# Patient Record
Sex: Female | Born: 1940 | Race: Black or African American | Hispanic: No | Marital: Single
Health system: Midwestern US, Community
[De-identification: ages and names within clinical notes are randomized; demographics above are authoritative.]

## PROBLEM LIST (undated history)

## (undated) DIAGNOSIS — D649 Anemia, unspecified: Secondary | ICD-10-CM

## (undated) DIAGNOSIS — K5901 Slow transit constipation: Secondary | ICD-10-CM

## (undated) DIAGNOSIS — Z1211 Encounter for screening for malignant neoplasm of colon: Secondary | ICD-10-CM

## (undated) DIAGNOSIS — R109 Unspecified abdominal pain: Secondary | ICD-10-CM

## (undated) DIAGNOSIS — R700 Elevated erythrocyte sedimentation rate: Secondary | ICD-10-CM

## (undated) DIAGNOSIS — R1319 Other dysphagia: Secondary | ICD-10-CM

## (undated) DIAGNOSIS — G8929 Other chronic pain: Secondary | ICD-10-CM

## (undated) DIAGNOSIS — I10 Essential (primary) hypertension: Secondary | ICD-10-CM

---

## 2008-03-20 LAB — METABOLIC PANEL, COMPREHENSIVE
A-G Ratio: 1 — ABNORMAL LOW (ref 1.2–3.5)
ALT (SGPT): 31 U/L (ref 30–65)
AST (SGOT): 36 U/L (ref 15–37)
Albumin: 4 g/dL (ref 3.2–4.6)
Alk. phosphatase: 69 U/L (ref 50–136)
Anion gap: 7 mmol/L (ref 7–16)
BUN: 15 MG/DL (ref 8–23)
Bilirubin, total: 0.5 MG/DL (ref 0.2–1.1)
CO2: 28 MMOL/L (ref 21–32)
Calcium: 8.8 MG/DL (ref 8.4–10.4)
Chloride: 105 MMOL/L (ref 98–107)
Creatinine: 0.8 MG/DL (ref 0.6–1.0)
GFR est AA: >60 mL/min/{1.73_m2} (ref >60)
GFR est non-AA: >60 mL/min/{1.73_m2} (ref >60)
Globulin: 4.1 g/dL — ABNORMAL HIGH (ref 2.3–3.5)
Glucose: 108 MG/DL — ABNORMAL HIGH (ref 74–106)
Potassium: 4.6 MMOL/L (ref 3.5–5.1)
Protein, total: 8.1 g/dL (ref 6.3–8.2)
Sodium: 140 MMOL/L (ref 136–145)

## 2008-03-20 LAB — CBC WITH AUTOMATED DIFF
ABS. BASOPHILS: 0 10*3/uL (ref 0.0–0.2)
ABS. EOSINOPHILS: 0.1 10*3/uL (ref 0.00–0.80)
ABS. IMM. GRANS.: 0 10*3/uL (ref 0.0–2.0)
ABS. LYMPHOCYTES: 1.7 10*3/uL (ref 0.6–4.3)
ABS. MONOCYTES: 0.3 10*3/uL (ref 0.1–0.9)
ABS. NEUTROPHILS: 1.8 10*3/uL — ABNORMAL LOW (ref 1.9–7.8)
BASOPHILS: 0 % — ABNORMAL LOW (ref 0.1–1.6)
EOSINOPHILS: 2 % (ref 0.5–7.8)
HCT: 41.6 % (ref 35.6–45.0)
HGB: 14.1 g/dL (ref 11.7–15.0)
IMMATURE GRANULOCYTES: 0.3 % (ref 0.0–2.0)
LYMPHOCYTES: 43 % — ABNORMAL HIGH (ref 14.7–41.3)
MCH: 28.3 PG (ref 26.1–32.9)
MCHC: 33.9 g/dL (ref 31.4–35.0)
MCV: 83.5 FL (ref 79.6–97.8)
MONOCYTES: 8 % (ref 3.2–9.0)
MPV: 10.9 FL (ref 9.3–12.9)
NEUTROPHILS: 47 % (ref 47.0–74.6)
PLATELET: 174 10*3/uL (ref 140–440)
RBC: 4.98 M/uL (ref 3.86–5.18)
RDW: 13.7 % (ref 11.9–14.6)
WBC: 3.9 10*3/uL — ABNORMAL LOW (ref 4.5–10.5)

## 2008-03-20 LAB — URINE MICROSCOPIC
Casts: 0 /LPF
Crystals, urine: 0 /LPF
Mucus: 0 /LPF
RBC: 0 /HPF

## 2008-03-20 MED ORDER — SALINE PERIPHERAL FLUSH PRN
INTRAMUSCULAR | Status: DC | PRN
Start: 2008-03-20 — End: 2008-03-20

## 2008-03-20 MED ORDER — SODIUM CHLORIDE 0.9 % IV
INTRAVENOUS | Status: DC
Start: 2008-03-20 — End: 2008-03-20
  Administered 2008-03-20: 15:00:00 via INTRAVENOUS

## 2008-03-20 MED ORDER — DICYCLOMINE 10 MG CAP
10 mg | ORAL_CAPSULE | Freq: Four times a day (QID) | ORAL | Status: AC
Start: 2008-03-20 — End: 2008-03-25

## 2008-03-20 MED ORDER — ONDANSETRON (PF) 4 MG/2 ML INJECTION
4 mg/2 mL | INTRAMUSCULAR | Status: AC
Start: 2008-03-20 — End: 2008-03-20
  Administered 2008-03-20: 15:00:00 via INTRAVENOUS

## 2008-03-20 MED ORDER — DIATRIZOATE MEGLUMINE & SODIUM 66 %-10 % ORAL SOLN
66-10 % | Freq: Once | ORAL | Status: AC
Start: 2008-03-20 — End: 2008-03-20
  Administered 2008-03-20: 15:00:00 via ORAL

## 2008-03-20 MED ORDER — IOVERSOL 320 MG/ML IV SOLN
320 mg iodine/mL | Freq: Once | INTRAVENOUS | Status: AC
Start: 2008-03-20 — End: 2008-03-20
  Administered 2008-03-20: 17:00:00 via INTRAVENOUS

## 2008-03-20 MED ORDER — SALINE PERIPHERAL FLUSH Q8H
Freq: Three times a day (TID) | INTRAMUSCULAR | Status: DC
Start: 2008-03-20 — End: 2008-03-20

## 2008-03-20 MED ORDER — HYDROMORPHONE 2 MG/ML INJECTION SOLUTION
2 mg/mL | INTRAMUSCULAR | Status: AC
Start: 2008-03-20 — End: 2008-03-20
  Administered 2008-03-20: 15:00:00 via INTRAVENOUS

## 2008-03-20 MED ORDER — SODIUM CHLORIDE 0.9 % IV
INTRAVENOUS | Status: DC
Start: 2008-03-20 — End: 2008-03-20
  Administered 2008-03-20: 14:00:00 via INTRAVENOUS

## 2008-03-20 MED FILL — ONDANSETRON (PF) 4 MG/2 ML INJECTION: 4 mg/2 mL | INTRAMUSCULAR | Qty: 2

## 2008-03-20 MED FILL — HYDROMORPHONE 2 MG/ML INJECTION SOLUTION: 2 mg/mL | INTRAMUSCULAR | Qty: 1

## 2008-03-20 NOTE — Progress Notes (Signed)
Oral contrast for CT delivered to ER

## 2008-03-20 NOTE — ED Notes (Signed)
Bedside report given to [facility]. Patient sitting up in bed drinking contrast for CT. No apparent distress.

## 2008-03-20 NOTE — ED Notes (Signed)
I have reviewed discharge instructions with the patient.  The patient verbalized understanding.  To make follow up appt. with referral physician.  Return if gets worse.   Given written info about [employer] [facility]

## 2008-03-20 NOTE — ED Notes (Signed)
Finished contrast.  CT notified

## 2008-03-20 NOTE — ED Notes (Signed)
Attempts to get U/A without success. Aware NPO>

## 2008-03-20 NOTE — ED Notes (Signed)
Pt was feeling better after pain med. Waiting for her to go to CT.

## 2008-03-20 NOTE — ED Provider Notes (Signed)
HPI Comments: Pt stated she has had some mild abd pain in right lower side intermittently for the last few weeks. Over the last couple days this pain has worsened and she has developed some over lying colicky type pain in riq. She denies any n/v/d/c/ She has taken some otc meds.     Abdominal Pain   The history is provided by the patient. The current episode started more than 1 week ago. The problem has been gradually worsening. The pain is severe. Associated symptoms include frequency and back pain. Pertinent negatives include no anorexia, no fever, no diarrhea, no hematochezia, no melena, no nausea, no vomiting, no constipation, no dysuria, no hematuria, no trauma and no chest pain. The pain is worsened by certain positions. The pain is relieved by nothing.        Past Medical History   Diagnosis Date   ??? Hypertension    ??? Endocrine Disease      thyroid   ??? Diabetes    ??? Other Ill-Defined Conditions      cholesterol          Past Surgical History   Procedure Date   ??? Hx heent      goiter/thyroid surgery           No family history on file.     History   Social History   ??? Marital Status: Single     Spouse Name: N/A     Number of Children: N/A   ??? Years of Education: N/A   Occupational History   ??? Not on file.   Social History Main Topics   ??? Tobacco Use: Never   ??? Alcohol Use: No   ??? Drug Use: No   ??? Sexually Active:    Other Topics Concern   ??? Not on file   Social History Narrative   ??? No narrative on file           ALLERGIES: Other      Review of Systems   Constitutional: Negative for fever and chills.   HENT: Negative for sore throat.    Respiratory: Negative for cough and shortness of breath.    Cardiovascular: Negative for chest pain.   Gastrointestinal: Positive for abdominal pain. Negative for nausea, vomiting, diarrhea, constipation, blood in stool, melena and hematochezia.   Genitourinary: Positive for frequency. Negative for dysuria and hematuria.   Musculoskeletal: Positive for back pain.    All other systems reviewed and are negative.      Filed Vitals:    03/20/2008  9:30 AM   BP: 151/83   Pulse: 70   Temp: 98.4 ??F (36.9 ??C)   Resp: 16   Height: 5\' 2"  (1.575 m)   Weight: 210 lb (95.255 kg)   SpO2: 100%              Physical Exam   Nursing note and vitals reviewed.  Constitutional: She is oriented. She appears well-developed and well-nourished. She appears distressed.   HENT:   Head: Normocephalic and atraumatic.   Right Ear: External ear normal.   Left Ear: External ear normal.   Mouth/Throat: Oropharynx is clear and moist.   Eyes: Extraocular motions are normal. Pupils are equal, round, and reactive to light.   Neck: Normal range of motion. Neck supple.   Cardiovascular: Normal rate and regular rhythm.    Pulmonary/Chest: Effort normal and breath sounds normal.   Abdominal: Soft. Tenderness is present. She has no rebound and no guarding.  Some tenderness in rlq bowel sounds dec   Musculoskeletal: Normal range of motion. She exhibits no edema and no tenderness.   Lymphadenopathy:     She has no cervical adenopathy.   Neurological: She is alert and oriented.   Skin: Skin is warm and dry.   Psychiatric: She has a normal mood and affect.            Coding

## 2008-03-20 NOTE — ED Notes (Signed)
Ct report does not show any surgical abnormality. Discussed this with pt. Will refer to fmd on call.

## 2009-06-13 NOTE — ED Provider Notes (Signed)
HPI Comments: 68 bf complains of chest pain that started yesterday. The pain is in her right chest and it radiates to the left side and into the upper left abdomen. No nausea or vomiting and no sob. She is not a smoker and there is family history of CVA but no heart disease.     Patient is a 68 y.o. female presenting with chest pain. The history is provided by the patient and a relative.   Chest Pain   This is a new problem. The current episode started 12 to 24 hours ago. The problem has been gradually improving. The problem occurs constantly. The pain is associated with normal activity, stress and coughing. The pain is present in the right side and epigastric region. The pain is at a severity of 3/10. The pain is mild. The quality of the pain is described as pressure-like. Radiates to: ritght chest to the left abdomen. The symptoms are aggravated by certain positions and deep breathing. Associated symptoms include exertional chest pressure. Pertinent negatives include no diaphoresis, no fever, no malaise/fatigue, no numbness, no irregular heartbeat, no palpitations, no nausea, no vomiting, no back pain, no dizziness, no weakness, no cough and no shortness of breath. She has tried nothing for the symptoms. Risk factors include diabetes mellitus, obesity and hypertension. Her past medical history is significant for DM and HTN.       Past Medical History   Diagnosis Date   ??? Hypertension    ??? Endocrine disease      thyroid   ??? Diabetes    ??? Other ill-defined conditions      cholesterol   ??? Other ill-defined conditions      heart cath 96          Past Surgical History   Procedure Date   ??? Hx heent      goiter/thyroid surgery           No family history on file.     History   Social History   ??? Marital Status: Single     Spouse Name: N/A     Number of Children: N/A   ??? Years of Education: N/A   Occupational History   ??? Not on file.   Social History Main Topics   ??? Tobacco Use: Never   ??? Alcohol Use: No    ??? Drug Use: No   ??? Sexually Active:    Other Topics Concern   ??? Not on file   Social History Narrative   ??? No narrative on file           ALLERGIES: Other and Ativan      Review of Systems   Constitutional: Negative for fever, malaise/fatigue and diaphoresis.   Respiratory: Negative for cough and shortness of breath.    Cardiovascular: Positive for chest pain. Negative for palpitations.   Gastrointestinal: Negative for nausea and vomiting.   Musculoskeletal: Negative for back pain.   Neurological: Negative for dizziness, weakness and numbness.   All other systems reviewed and are negative.        Filed Vitals:    06/13/2009  9:58 PM   BP: 171/72   Pulse: 68   Temp: 98 ??F (36.7 ??C)   Resp: 14   Height: 5\' 2"  (1.575 m)   Weight: 215 lb (97.523 kg)   SpO2: 97%              Physical Exam   Nursing note and vitals reviewed.  Constitutional: She  is oriented to person, place, and time. She appears well-developed and well-nourished.   HENT:   Head: Normocephalic and atraumatic.   Right Ear: External ear normal.   Left Ear: External ear normal.   Nose: Nose normal.   Mouth/Throat: Oropharynx is clear and moist.   Eyes: Conjunctivae and extraocular motions are normal. Pupils are equal, round, and reactive to light.   Neck: Normal range of motion. Neck supple.   Cardiovascular: Normal rate, regular rhythm, normal heart sounds and intact distal pulses.    Pulmonary/Chest: Effort normal and breath sounds normal. No respiratory distress. She has no wheezes. She has no rales. She exhibits no tenderness.   Abdominal: Soft. Bowel sounds are normal. She exhibits no distension. No tenderness. She has no rebound.   Musculoskeletal: Normal range of motion. She exhibits no edema and no tenderness.   Neurological: She is alert and oriented to person, place, and time.   Skin: Skin is warm and dry.   Psychiatric: She has a normal mood and affect.        Coding    Procedures

## 2009-06-13 NOTE — ED Notes (Signed)
Pt states started having cp yesterday and thought it would get better. States she came in today because her chest, back (lower) and L lower ribcage area hurt.

## 2009-06-14 LAB — CBC WITH AUTOMATED DIFF
ABS. BASOPHILS: 0 10*3/uL (ref 0.0–0.2)
ABS. EOSINOPHILS: 0.1 10*3/uL (ref 0.0–0.8)
ABS. IMM. GRANS.: 0 10*3/uL (ref 0.0–2.0)
ABS. LYMPHOCYTES: 2.2 10*3/uL (ref 0.5–4.6)
ABS. MONOCYTES: 0.3 10*3/uL (ref 0.1–1.3)
ABS. NEUTROPHILS: 1.9 10*3/uL (ref 1.7–8.2)
BASOPHILS: 0 % (ref 0.0–2.0)
EOSINOPHILS: 3 % (ref 0.5–7.8)
HCT: 42.5 % (ref 37.6–48.3)
HGB: 13.8 g/dL (ref 11.7–15.0)
IMMATURE GRANULOCYTES: 0.2 % (ref 0.0–2.0)
LYMPHOCYTES: 48 % — ABNORMAL HIGH (ref 13–44)
MCH: 28.1 PG (ref 26.1–32.9)
MCHC: 32.5 g/dL (ref 31.4–35.0)
MCV: 86.6 FL (ref 79.6–97.8)
MONOCYTES: 7 % (ref 4.0–12.0)
MPV: 11.4 FL (ref 10.8–14.1)
NEUTROPHILS: 42 % — ABNORMAL LOW (ref 43–78)
PLATELET: 175 10*3/uL (ref 140–440)
RBC: 4.91 M/uL (ref 3.86–5.18)
RDW: 13.7 % (ref 11.9–14.6)
WBC: 4.6 10*3/uL (ref 4.0–10.5)

## 2009-06-14 LAB — METABOLIC PANEL, COMPREHENSIVE
A-G Ratio: 1 — ABNORMAL LOW (ref 1.2–3.5)
ALT (SGPT): 37 U/L — ABNORMAL LOW (ref 39–65)
AST (SGOT): 22 U/L (ref 15–37)
Albumin: 3.9 g/dL (ref 3.2–4.6)
Alk. phosphatase: 71 U/L (ref 50–136)
Anion gap: 6 mmol/L — ABNORMAL LOW (ref 7–16)
BUN: 13 MG/DL (ref 8–23)
Bilirubin, total: 0.7 MG/DL (ref 0.2–1.1)
CO2: 27 MMOL/L (ref 21–32)
Calcium: 9.3 MG/DL (ref 8.4–10.4)
Chloride: 104 MMOL/L (ref 98–107)
Creatinine: 0.9 MG/DL (ref 0.6–1.0)
GFR est AA: >60 mL/min/{1.73_m2} (ref >60)
GFR est non-AA: >60 mL/min/{1.73_m2} (ref >60)
Globulin: 4.1 g/dL — ABNORMAL HIGH (ref 2.3–3.5)
Glucose: 103 MG/DL (ref 82–115)
Potassium: 3.8 MMOL/L (ref 3.5–5.1)
Protein, total: 8 g/dL (ref 6.3–8.2)
Sodium: 137 MMOL/L (ref 136–145)

## 2009-06-14 MED ORDER — PROPOXYPHENE N-ACETAMINOPHEN 100 MG-650 MG TAB
100-650 mg | ORAL_TABLET | Freq: Four times a day (QID) | ORAL | Status: AC | PRN
Start: 2009-06-14 — End: 2009-06-21

## 2009-06-14 MED ADMIN — propoxyphene napsylate-acetaminophen (DARVOCET-N 100) 100-650 mg per tablet 1 Tab: ORAL | @ 04:00:00 | NDC 51079032201

## 2009-06-14 MED ADMIN — aspirin chewable tablet 324 mg: ORAL | @ 02:00:00 | NDC 63739043401

## 2009-06-14 MED FILL — PROPOXYPHENE N-ACETAMINOPHEN 100 MG-650 MG TAB: 100-650 mg | ORAL | Qty: 1

## 2009-06-14 MED FILL — ASPIRIN 81 MG CHEWABLE TAB: 81 mg | ORAL | Qty: 4

## 2009-06-14 NOTE — ED Notes (Signed)
I have reviewed discharge instructions with the patient.  The patient verbalized understanding.

## 2011-10-06 LAB — METABOLIC PANEL, COMPREHENSIVE
A-G Ratio: 0.9 — ABNORMAL LOW (ref 1.2–3.5)
ALT (SGPT): 29 U/L (ref 12–65)
AST (SGOT): 23 U/L (ref 15–37)
Albumin: 4 g/dL (ref 3.2–4.6)
Alk. phosphatase: 50 U/L (ref 50–136)
Anion gap: 9 mmol/L (ref 7–16)
BUN: 14 MG/DL (ref 8–23)
Bilirubin, total: 0.7 MG/DL (ref 0.2–1.1)
CO2: 28 MMOL/L (ref 21–32)
Calcium: 9.2 MG/DL (ref 8.3–10.4)
Chloride: 104 MMOL/L (ref 98–107)
Creatinine: 0.84 MG/DL (ref 0.6–1.0)
GFR est AA: >60 mL/min/{1.73_m2} (ref >60)
GFR est non-AA: >60 mL/min/{1.73_m2} (ref >60)
Globulin: 4.3 g/dL — ABNORMAL HIGH (ref 2.3–3.5)
Glucose: 82 MG/DL (ref 65–100)
Potassium: 4 MMOL/L (ref 3.5–5.1)
Protein, total: 8.3 g/dL — ABNORMAL HIGH (ref 6.3–8.2)
Sodium: 141 MMOL/L (ref 136–145)

## 2011-10-06 LAB — CBC WITH AUTOMATED DIFF
ABS. BASOPHILS: 0 10*3/uL (ref 0.0–0.2)
ABS. EOSINOPHILS: 0.1 10*3/uL (ref 0.0–0.8)
ABS. IMM. GRANS.: 0 10*3/uL (ref 0.0–0.5)
ABS. LYMPHOCYTES: 2.2 10*3/uL (ref 0.5–4.6)
ABS. MONOCYTES: 0.2 10*3/uL (ref 0.1–1.3)
ABS. NEUTROPHILS: 1.9 10*3/uL (ref 1.7–8.2)
BASOPHILS: 0 % (ref 0.0–2.0)
EOSINOPHILS: 2 % (ref 0.5–7.8)
HCT: 44.2 % (ref 35.8–46.3)
HGB: 15.1 g/dL (ref 11.7–15.4)
IMMATURE GRANULOCYTES: 0.2 % (ref 0.0–5.0)
LYMPHOCYTES: 50 % — ABNORMAL HIGH (ref 13–44)
MCH: 28.6 PG (ref 26.1–32.9)
MCHC: 34.2 g/dL (ref 31.4–35.0)
MCV: 83.7 FL (ref 79.6–97.8)
MONOCYTES: 5 % (ref 4.0–12.0)
MPV: 10.9 FL (ref 10.8–14.1)
NEUTROPHILS: 43 % (ref 43–78)
PLATELET: 159 10*3/uL (ref 150–450)
RBC: 5.28 M/uL — ABNORMAL HIGH (ref 4.05–5.25)
RDW: 13.6 % (ref 11.9–14.6)
WBC: 4.5 10*3/uL (ref 4.3–11.1)

## 2011-10-06 LAB — POC TROPONIN: Troponin-I (POC): 0.01 ng/ml (ref 0.0–0.08)

## 2011-10-06 NOTE — ED Notes (Signed)
I have reviewed discharge instructions with the patient. Prescriptions was not given.   The patient verbalized understanding.  Discharged ambulatory with family in no acute distress.

## 2011-10-06 NOTE — ED Notes (Signed)
.  I have discussed the results of labs, procedures, radiographs, treatments as well as any previous results found within the [facility]. [employer] with the patient and available family. A treatment plan was developed in conjunction with the patient and was agreed upon. The patient is ready for discharge at this time. All voiced understanding of the discharge plan and medication instructions or changes as appropriate. Questions about treatment in the ED were answered. The patient was encouraged to return should symptoms worsen or new problems develop. A follow up physician was provided to the patient on the discharge papers.       HR in the ED has remained within normal range.  Pt understands she needs PMD follow up for possible holter monitor placement.

## 2011-10-06 NOTE — ED Notes (Signed)
Int started and labs drawn and sent

## 2011-10-06 NOTE — ED Provider Notes (Signed)
Patient is a 71 y.o. female presenting with palpitations. The history is provided by the patient and a relative.   Palpitations   This is a recurrent problem. The current episode started 6 to 12 hours ago. The problem has been resolved. Episode frequency: occur intermittently. The problem is associated with nothing. Associated symptoms include chest pain. Pertinent negatives include no diaphoresis, no near-syncope and no syncope. Risk factors include no risk factors.      Pt is a 71 y/o female with hx of palpitations.  She has not had PMD or cardiology eval for same. She reports around 1am having an episode of palpitations lasting to about 6pm and then resolving spontaneously. She has "pain under her left breast" but denies chest pain.   Past Medical History   Diagnosis Date   ??? Diabetes    ??? Hypertension    ??? CAD (coronary artery disease)         No past surgical history on file.      No family history on file.     History     Social History   ??? Marital Status: SINGLE     Spouse Name: N/A     Number of Children: N/A   ??? Years of Education: N/A     Occupational History   ??? Not on file.     Social History Main Topics   ??? Smoking status: Not on file   ??? Smokeless tobacco: Not on file   ??? Alcohol Use:    ??? Drug Use:    ??? Sexually Active:      Other Topics Concern   ??? Not on file     Social History Narrative   ??? No narrative on file                  ALLERGIES: Ativan and Other      Review of Systems   Constitutional: Negative for diaphoresis.   Cardiovascular: Positive for chest pain and palpitations. Negative for syncope and near-syncope.   All other systems reviewed and are negative.        Filed Vitals:    10/06/11 1456   BP: 157/88   Pulse: 80   Resp: 16   Height: 5\' 2"  (1.575 m)   Weight: 97.523 kg (215 lb)   SpO2: 98%            Physical Exam   Nursing note and vitals reviewed.  Constitutional: She is oriented to person, place, and time. She appears well-developed and well-nourished.   HENT:   Head: Normocephalic  and atraumatic.   Mouth/Throat: Oropharynx is clear and moist.   Eyes: EOM are normal. Pupils are equal, round, and reactive to light.   Neck: Normal range of motion. Neck supple.   Cardiovascular: Normal rate, regular rhythm, normal heart sounds and intact distal pulses.    Pulmonary/Chest: Effort normal and breath sounds normal.   Abdominal: Soft. Bowel sounds are normal. There is no tenderness.   Musculoskeletal: Normal range of motion.   Neurological: She is alert and oriented to person, place, and time.   Skin: Skin is warm and dry.   Psychiatric: She has a normal mood and affect.        MDM    Procedures

## 2011-10-07 LAB — EKG, 12 LEAD, INITIAL
Atrial Rate: 80 {beats}/min
Calculated P Axis: 69 degrees
Calculated R Axis: 39 degrees
Calculated T Axis: 34 degrees
P-R Interval: 198 ms
Q-T Interval: 380 ms
QRS Duration: 88 ms
QTC Calculation (Bezet): 438 ms
Ventricular Rate: 80 {beats}/min

## 2014-06-11 LAB — AMB EXT HGBA1C: Hemoglobin A1c, External: 5.8

## 2014-06-11 LAB — AMB EXT LDL-C: LDL-C, External: 121.8

## 2015-07-06 ENCOUNTER — Emergency Department: Admit: 2015-07-06 | Payer: PRIVATE HEALTH INSURANCE | Primary: Family Medicine

## 2015-07-06 ENCOUNTER — Inpatient Hospital Stay
Admit: 2015-07-06 | Discharge: 2015-07-08 | Disposition: A | Discharge status: Home / Self Care | Payer: PRIVATE HEALTH INSURANCE | Attending: Cardiovascular Disease | Admitting: Cardiovascular Disease

## 2015-07-06 DIAGNOSIS — I25110 Atherosclerotic heart disease of native coronary artery with unstable angina pectoris: Principal | ICD-10-CM

## 2015-07-06 LAB — EKG, 12 LEAD, INITIAL
Atrial Rate: 75 {beats}/min
Calculated P Axis: 66 degrees
Calculated R Axis: 41 degrees
Calculated T Axis: 67 degrees
P-R Interval: 212 ms
Q-T Interval: 422 ms
QRS Duration: 90 ms
QTC Calculation (Bezet): 471 ms
Ventricular Rate: 75 {beats}/min

## 2015-07-06 LAB — METABOLIC PANEL, COMPREHENSIVE
A-G Ratio: 1 — ABNORMAL LOW (ref 1.2–3.5)
ALT (SGPT): 33 U/L (ref 12–65)
AST (SGOT): 37 U/L (ref 15–37)
Albumin: 3.8 g/dL (ref 3.2–4.6)
Alk. phosphatase: 58 U/L (ref 50–136)
Anion gap: 6 mmol/L — ABNORMAL LOW (ref 7–16)
BUN: 12 MG/DL (ref 8–23)
Bilirubin, total: 1.2 MG/DL — ABNORMAL HIGH (ref 0.2–1.1)
CO2: 30 mmol/L (ref 21–32)
Calcium: 8.7 MG/DL (ref 8.3–10.4)
Chloride: 103 mmol/L (ref 98–107)
Creatinine: 0.96 MG/DL (ref 0.6–1.0)
GFR est AA: >60 mL/min/{1.73_m2} (ref >60)
GFR est non-AA: >60 mL/min/{1.73_m2} (ref >60)
Globulin: 3.9 g/dL — ABNORMAL HIGH (ref 2.3–3.5)
Glucose: 91 mg/dL (ref 65–100)
Potassium: 4.7 mmol/L (ref 3.5–5.1)
Protein, total: 7.7 g/dL (ref 6.3–8.2)
Sodium: 139 mmol/L (ref 136–145)

## 2015-07-06 LAB — CBC WITH AUTOMATED DIFF
ABS. BASOPHILS: 0 10*3/uL (ref 0.0–0.2)
ABS. EOSINOPHILS: 0.1 10*3/uL (ref 0.0–0.8)
ABS. IMM. GRANS.: 0 10*3/uL (ref 0.0–0.5)
ABS. LYMPHOCYTES: 2.3 10*3/uL (ref 0.5–4.6)
ABS. MONOCYTES: 0.3 10*3/uL (ref 0.1–1.3)
ABS. NEUTROPHILS: 1.4 10*3/uL — ABNORMAL LOW (ref 1.7–8.2)
BASOPHILS: 0 % (ref 0.0–2.0)
EOSINOPHILS: 2 % (ref 0.5–7.8)
HCT: 43.2 % (ref 35.8–46.3)
HGB: 14.5 g/dL (ref 11.7–15.4)
IMMATURE GRANULOCYTES: 0.7 % (ref 0.0–5.0)
LYMPHOCYTES: 55 % — ABNORMAL HIGH (ref 13–44)
MCH: 28.9 PG (ref 26.1–32.9)
MCHC: 33.6 g/dL (ref 31.4–35.0)
MCV: 86.2 FL (ref 79.6–97.8)
MONOCYTES: 8 % (ref 4.0–12.0)
MPV: 12.2 FL (ref 10.8–14.1)
NEUTROPHILS: 34 % — ABNORMAL LOW (ref 43–78)
PLATELET: 153 10*3/uL (ref 150–450)
RBC: 5.01 M/uL (ref 4.05–5.25)
RDW: 13.8 % (ref 11.9–14.6)
WBC: 4.1 10*3/uL — ABNORMAL LOW (ref 4.3–11.1)

## 2015-07-06 LAB — TROPONIN I
Troponin-I, Qt.: 0.1 NG/ML — CR (ref 0.02–0.05)
Troponin-I, Qt.: 0.11 NG/ML — CR (ref 0.02–0.05)
Troponin-I, Qt.: 0.11 NG/ML — CR (ref 0.02–0.05)

## 2015-07-06 LAB — GLUCOSE, POC: Glucose (POC): 93 mg/dL (ref 65–100)

## 2015-07-06 MED ORDER — HEPARIN (PORCINE) IN D5W 25,000 UNIT/500 ML IV
25000 unit/500 mL (50 unit/mL) | INTRAVENOUS | Status: DC
Start: 2015-07-06 — End: 2015-07-06

## 2015-07-06 MED ORDER — ASPIRIN 81 MG CHEWABLE TAB
81 mg | Freq: Every day | ORAL | Status: DC
Start: 2015-07-06 — End: 2015-07-08
  Administered 2015-07-07 – 2015-07-08 (×2): via ORAL

## 2015-07-06 MED ORDER — NITROGLYCERIN 2 % TRANSDERMAL OINTMENT
2 % | Freq: Four times a day (QID) | TRANSDERMAL | Status: DC
Start: 2015-07-06 — End: 2015-07-08
  Administered 2015-07-06 – 2015-07-08 (×8): via TOPICAL

## 2015-07-06 MED ORDER — NITROGLYCERIN 2 % TRANSDERMAL OINTMENT
2 % | TRANSDERMAL | Status: AC
Start: 2015-07-06 — End: 2015-07-06
  Administered 2015-07-06: 16:00:00 via TOPICAL

## 2015-07-06 MED ORDER — NITROGLYCERIN 0.4 MG SUBLINGUAL TAB
0.4 mg | SUBLINGUAL | Status: DC | PRN
Start: 2015-07-06 — End: 2015-07-08

## 2015-07-06 MED ORDER — SODIUM CHLORIDE 0.9 % IJ SYRG
INTRAMUSCULAR | Status: DC | PRN
Start: 2015-07-06 — End: 2015-07-08

## 2015-07-06 MED ORDER — ASPIRIN 81 MG CHEWABLE TAB
81 mg | Freq: Once | ORAL | Status: AC
Start: 2015-07-06 — End: 2015-07-06
  Administered 2015-07-06: 16:00:00 via ORAL

## 2015-07-06 MED ORDER — MORPHINE 2 MG/ML INJECTION
2 mg/mL | INTRAMUSCULAR | Status: DC | PRN
Start: 2015-07-06 — End: 2015-07-08

## 2015-07-06 MED ORDER — METOPROLOL TARTRATE 25 MG TAB
25 mg | Freq: Four times a day (QID) | ORAL | Status: DC
Start: 2015-07-06 — End: 2015-07-07
  Administered 2015-07-06: 22:00:00 via ORAL

## 2015-07-06 MED ORDER — ATORVASTATIN 40 MG TAB
40 mg | Freq: Every evening | ORAL | Status: DC
Start: 2015-07-06 — End: 2015-07-08
  Administered 2015-07-07 – 2015-07-08 (×2): via ORAL

## 2015-07-06 MED ORDER — HEPARIN (PORCINE) IN D5W 25,000 UNIT/500 ML IV
25000 unit/500 mL (50 unit/mL) | INTRAVENOUS | Status: DC
Start: 2015-07-06 — End: 2015-07-08
  Administered 2015-07-06 – 2015-07-08 (×7): via INTRAVENOUS

## 2015-07-06 MED ORDER — SODIUM CHLORIDE 0.9 % IJ SYRG
Freq: Three times a day (TID) | INTRAMUSCULAR | Status: DC
Start: 2015-07-06 — End: 2015-07-08
  Administered 2015-07-06 – 2015-07-08 (×5): via INTRAVENOUS

## 2015-07-06 MED ORDER — HYDROCODONE-ACETAMINOPHEN 5 MG-325 MG TAB
5-325 mg | Freq: Once | ORAL | Status: AC
Start: 2015-07-06 — End: 2015-07-06
  Administered 2015-07-06: 16:00:00 via ORAL

## 2015-07-06 MED ORDER — HEPARIN (PORCINE) 5,000 UNIT/ML IJ SOLN
5000 unit/mL | Freq: Once | INTRAMUSCULAR | Status: AC
Start: 2015-07-06 — End: 2015-07-06
  Administered 2015-07-06: 19:00:00 via INTRAVENOUS

## 2015-07-06 MED ORDER — SODIUM CHLORIDE 0.9 % IJ SYRG
Freq: Three times a day (TID) | INTRAMUSCULAR | Status: DC
Start: 2015-07-06 — End: 2015-07-08
  Administered 2015-07-06 – 2015-07-08 (×7): via INTRAVENOUS

## 2015-07-06 MED ORDER — LISINOPRIL-HYDROCHLOROTHIAZIDE 20 MG-12.5 MG TAB
Freq: Every day | ORAL | Status: DC
Start: 2015-07-06 — End: 2015-07-08
  Administered 2015-07-07 – 2015-07-08 (×2): via ORAL

## 2015-07-06 MED FILL — ASPIRIN 81 MG CHEWABLE TAB: 81 mg | ORAL | Qty: 4

## 2015-07-06 MED FILL — HEPARIN (PORCINE) IN D5W 25,000 UNIT/500 ML IV: 25000 unit/500 mL (50 unit/mL) | INTRAVENOUS | Qty: 500

## 2015-07-06 MED FILL — NITRO-BID 2 % TRANSDERMAL OINTMENT: 2 % | TRANSDERMAL | Qty: 1

## 2015-07-06 MED FILL — HYDROCODONE-ACETAMINOPHEN 5 MG-325 MG TAB: 5-325 mg | ORAL | Qty: 1

## 2015-07-06 MED FILL — HEPARIN (PORCINE) 5,000 UNIT/ML IJ SOLN: 5000 unit/mL | INTRAMUSCULAR | Qty: 1

## 2015-07-06 MED FILL — METOPROLOL TARTRATE 25 MG TAB: 25 mg | ORAL | Qty: 1

## 2015-07-06 NOTE — Progress Notes (Signed)
Bedside shift change report received from Josh. Z, RN. Report included the following information SBAR, Kardex, MAR and Recent Results.

## 2015-07-06 NOTE — ED Notes (Signed)
TRANSFER - OUT REPORT:    Verbal report given to Josh, RN on Sarah Chavez  being transferred to 302 for routine progression of care       Report consisted of patient???s Situation, Background, Assessment and   Recommendations(SBAR).     Information from the following report(s) SBAR, Kardex, ED Summary, MAR, Accordion, Recent Results and Med Rec Status was reviewed with the receiving nurse.    Lines:   Peripheral IV 07/06/15 Left Antecubital (Active)   Site Assessment Clean, dry, & intact 07/06/2015  3:02 PM   Phlebitis Assessment 0 07/06/2015  3:02 PM   Infiltration Assessment 0 07/06/2015  3:02 PM   Dressing Status Clean, dry, & intact 07/06/2015  3:02 PM   Dressing Type Tape;Transparent 07/06/2015  3:02 PM   Hub Color/Line Status Patent 07/06/2015  3:02 PM        Opportunity for questions and clarification was provided.      Patient transported with:   Monitor  Registered Nurse

## 2015-07-06 NOTE — H&P (Addendum)
[facility] H&P    Admitting Cardiologist:Dr. Kristeen Miss    Primary Cardiologist:none    Primary Care Physician:Dr. Debbrah Alar    Subjective:     Sarah Chavez is a 74 y.o. female with history of HTN, DM no prior CAD who presents with several month history of chest discomfort occurring at rest and with exertion, associated SOB, bilateral arm discomfort. She relates that her episode of chest discomfort have been increasing in frequency and intensity. Nonsmoker, several family members with prior CVA. She denies prior CVA. No bleeding or clotting tendencies.     Past Medical History   Diagnosis Date   ??? CAD (coronary artery disease)    ??? Diabetes ([place])    ??? Endocrine disease      thyroid   ??? Hypertension    ??? Other ill-defined conditions(799.89)      cholesterol   ??? Other ill-defined conditions(799.89)      heart cath 96      Past Surgical History   Procedure Laterality Date   ??? Hx heent       goiter/thyroid surgery      Current Facility-Administered Medications   Medication Dose Route Frequency   ??? sodium chloride (NS) flush 5-10 mL  5-10 mL IntraVENous Q8H   ??? sodium chloride (NS) flush 5-10 mL  5-10 mL IntraVENous PRN     Current Outpatient Prescriptions   Medication Sig   ??? lisinopril-hydrochlorothiazide (PRINZIDE, ZESTORETIC) 20-12.5 mg per tablet Take 1 Tab by mouth daily.   ??? glipiZIDE (GLUCOTROL) 10 mg tablet Take 25 mg by mouth daily (before breakfast).     Allergies   Allergen Reactions   ??? Ativan [Lorazepam] Other (comments)     Made her feel crazy and chest pressure   ??? Ativan [Lorazepam] Other (comments)     Chest fullness   ??? Other Medication Palpitations     Some type of decongestant   ??? Other Medication Other (comments)     Doesn't take decongestants, but can't recall why      Social History   Substance Use Topics   ??? Smoking status: Never Smoker   ??? Smokeless tobacco: Not on file   ??? Alcohol use Not on file      History reviewed. No pertinent family history.     Review of Systems   Gen: Denies fever, chills, malaise or fatigue. Appetite good.   HEENT: Denies frequent headaches, dizzyness, visual disturbances, Neck pain or swallowing difficulty  Lungs: Denies  hx of COPD or asthma  Cardiovascular: as above   GI: Denies hememesis, dark tarry stools, No prior Hx of GI bleed, Denies constipation  GU: Denies dysuria, no complaints of frequency, nocturia  Heme: No prior bleeding disorders, no prior Cancer  Neuro: Denies prior CVA, TIA.  Endocrine: + diabetes, no thyroid disorders  Psychiatric: Denies anxiety, or other psychiatric illnesses.   MSK--right hip discomfort, while walking--thru right buttocks.     Objective:     Visit Vitals   ??? BP 187/79   ??? Pulse (!) 57   ??? Temp 98 ??F (36.7 ??C)   ??? Resp 16   ??? Ht 5' 2" (1.575 m)   ??? Wt 99.8 kg (220 lb)   ??? SpO2 99%   ??? BMI 40.24 kg/m2     General:Alert, cooperative, no distress, appears stated age  Head: Normocephalic, without obvious abnormality, atraumatic.   Eyes: Conjunctivae/corneas clear. PERRL, EOMs intact  Nose:Nares normal. Septum midline. Mucosa normal. No drainage or sinus  tenderness.   Throat: Lips, mucosa, and tongue normal. Teeth and gums normal.   Neck: Supple, symmetrical, trachea midline,  no carotid bruit and no JVD.   Lungs:Clear to auscultation bilaterally.  Chest wall: left chest wall discomfort with palpation.   Heart: Regular rate and rhythm, S1, S2 normal, no murmur, click, rub or gallop.   Abdomen:Soft, non-tender. Bowel sounds normal. No masses, No organomegaly.   Extremities: Extremities normal, atraumatic, no cyanosis or edema.   Pulses: 2+ and symmetric all extremities.    Skin: Skin color, texture, turgor normal. No rashes or lesions  Lymph nodes: Cervical, supraclavicular, and axillary nodes normal  Neurologic:No focal deficits identified                 ECG: NSR     Data Review:     Recent Results (from the past 24 hour(s))   TROPONIN I    Collection Time: 07/06/15 10:40 AM   Result Value Ref Range     Troponin-I, Qt. 0.11 (HH) 0.02 - 0.05 NG/ML   CBC WITH AUTOMATED DIFF    Collection Time: 07/06/15 10:40 AM   Result Value Ref Range    WBC 4.1 (L) 4.3 - 11.1 K/uL    RBC 5.01 4.05 - 5.25 M/uL    HGB 14.5 11.7 - 15.4 g/dL    HCT 43.2 35.8 - 46.3 %    MCV 86.2 79.6 - 97.8 FL    MCH 28.9 26.1 - 32.9 PG    MCHC 33.6 31.4 - 35.0 g/dL    RDW 13.8 11.9 - 14.6 %    PLATELET 153 150 - 450 K/uL    MPV 12.2 10.8 - 14.1 FL    DF AUTOMATED      NEUTROPHILS 34 (L) 43 - 78 %    LYMPHOCYTES 55 (H) 13 - 44 %    MONOCYTES 8 4.0 - 12.0 %    EOSINOPHILS 2 0.5 - 7.8 %    BASOPHILS 0 0.0 - 2.0 %    IMMATURE GRANULOCYTES 0.7 0.0 - 5.0 %    ABS. NEUTROPHILS 1.4 (L) 1.7 - 8.2 K/UL    ABS. LYMPHOCYTES 2.3 0.5 - 4.6 K/UL    ABS. MONOCYTES 0.3 0.1 - 1.3 K/UL    ABS. EOSINOPHILS 0.1 0.0 - 0.8 K/UL    ABS. BASOPHILS 0.0 0.0 - 0.2 K/UL    ABS. IMM. GRANS. 0.0 0.0 - 0.5 K/UL   METABOLIC PANEL, COMPREHENSIVE    Collection Time: 07/06/15 10:40 AM   Result Value Ref Range    Sodium 139 136 - 145 mmol/L    Potassium 4.7 3.5 - 5.1 mmol/L    Chloride 103 98 - 107 mmol/L    CO2 30 21 - 32 mmol/L    Anion gap 6 (L) 7 - 16 mmol/L    Glucose 91 65 - 100 mg/dL    BUN 12 8 - 23 MG/DL    Creatinine 0.96 0.6 - 1.0 MG/DL    GFR est AA >60 >60 ml/min/1.29m    GFR est non-AA >60 >60 ml/min/1.737m   Calcium 8.7 8.3 - 10.4 MG/DL    Bilirubin, total 1.2 (H) 0.2 - 1.1 MG/DL    ALT 33 12 - 65 U/L    AST 37 15 - 37 U/L    Alk. phosphatase 58 50 - 136 U/L    Protein, total 7.7 6.3 - 8.2 g/dL    Albumin 3.8 3.2 - 4.6 g/dL    Globulin 3.9 (H)  2.3 - 3.5 g/dL    A-G Ratio 1.0 (L) 1.2 - 3.5     TROPONIN I    Collection Time: 07/06/15  1:00 PM   Result Value Ref Range    Troponin-I, Qt. 0.11 (HH) 0.02 - 0.05 NG/ML         Assessment / Plan     Principal Problem:    Chest pain (07/06/2015)--chest discomfort is somewhat atypical and reproducible on exam, however ongoing with associated SOB, bilateral arm discomfort, +HTN DM with elevated troponin. Advised to be admitted for  further evaluation. Will admit to telemetry, check additonal enzymes, add IV heparin, plan for LHC --pt received asa already. Add ntg   Active Problems:    HTN (hypertension) (07/06/2015)--elevated on admission, adjust meds as needed       Diabetes mellitus type 2, controlled ([place]) (07/06/2015)--monitor, continue oral agent      Elevated troponin (07/06/2015)--as above.       Hip pain--consider xrays if worsens.  --suspect sciatic pain         Peyton Bottoms, NP

## 2015-07-06 NOTE — ED Triage Notes (Signed)
Chest pain for a few days that is generalized from neck and into shoulders and chest. Denies Hx of cardiac issues and does not have a cardiology/

## 2015-07-06 NOTE — Progress Notes (Signed)
TRANSFER - IN REPORT:    Verbal report received from [place], RN(name) on Hartford FinancialVernell Merlino  being received from ED(unit) for routine progression of care      Report consisted of patient???s Situation, Background, Assessment and   Recommendations(SBAR).     Information from the following report(s) SBAR, ED Summary, MAR, Recent Results and Cardiac Rhythm NSR 1 degree av block was reviewed with the receiving nurse.    Opportunity for questions and clarification was provided.      Assessment completed upon patient???s arrival to unit and care assumed.

## 2015-07-06 NOTE — ED Notes (Signed)
Patient states she is thinking on if she wants to stay.

## 2015-07-06 NOTE — ED Triage Notes (Signed)
Also states lower back pain and into hips. Pt walks with a cane.

## 2015-07-06 NOTE — ED Provider Notes (Addendum)
HPI Comments: 74 year old lady with a history of pain in her left lower chest/left upper quadrant abdomen and pain in her right lower back that goes down her right leg.  Patient says the pain in her leg has been present for several days but has gotten worse in the last 24 hours.  Patient notes that the pain in her left lower chest has been coming and going for 3 or 4 months.  With the chest pain she says she has not had any fevers, vomiting, or diarrhea.  She has had no cough or difficulty breathing.    With respect to her pain going down her leg she has had no bowel or bladder problems and no weakness or numbness.    Elements of this note were created with speech recognition software. As such, errors of speech recognition may have occurred.      Patient is a 74 y.o. female presenting with chest pain.   Chest Pain (Angina)    Associated symptoms include back pain. Pertinent negatives include no abdominal pain, no cough, no diaphoresis, no dizziness, no fever, no headaches, no nausea, no palpitations, no shortness of breath, no vomiting and no weakness.        Past Medical History:   Diagnosis Date   ??? CAD (coronary artery disease)    ??? Diabetes (HCC)    ??? Endocrine disease      thyroid   ??? Hypertension    ??? Other ill-defined conditions(799.89)      cholesterol   ??? Other ill-defined conditions(799.89)      heart cath 96       Past Surgical History:   Procedure Laterality Date   ??? Hx heent       goiter/thyroid surgery         History reviewed. No pertinent family history.    Social History     Social History   ??? Marital status: SINGLE     Spouse name: N/A   ??? Number of children: N/A   ??? Years of education: N/A     Occupational History   ??? Not on file.     Social History Main Topics   ??? Smoking status: Never Smoker   ??? Smokeless tobacco: Not on file   ??? Alcohol use Not on file   ??? Drug use: Not on file   ??? Sexual activity: Not on file     Other Topics Concern   ??? Not on file     Social History Narrative     ** Merged History Encounter **              ALLERGIES: Ativan [lorazepam]; Ativan [lorazepam]; Other medication; and Other medication    Review of Systems   Constitutional: Negative for chills, diaphoresis and fever.   HENT: Negative for congestion, rhinorrhea and sore throat.    Eyes: Negative for redness and visual disturbance.   Respiratory: Negative for cough, chest tightness, shortness of breath and wheezing.    Cardiovascular: Positive for chest pain. Negative for palpitations.   Gastrointestinal: Negative for abdominal pain, blood in stool, diarrhea, nausea and vomiting.   Endocrine: Negative for polydipsia and polyuria.   Genitourinary: Negative for dysuria and hematuria.   Musculoskeletal: Positive for back pain. Negative for arthralgias, myalgias and neck stiffness.   Skin: Negative for rash.   Allergic/Immunologic: Negative for environmental allergies and food allergies.   Neurological: Negative for dizziness, weakness and headaches.   Hematological: Negative for adenopathy. Does not bruise/bleed  easily.   Psychiatric/Behavioral: Negative for confusion and sleep disturbance. The patient is not nervous/anxious.        Vitals:    07/06/15 1037   BP: 153/73   Pulse: (!) 50   Resp: 16   Temp: 98 ??F (36.7 ??C)   SpO2: 98%   Weight: 99.8 kg (220 lb)   Height: 5\' 2"  (1.575 m)            Physical Exam   Constitutional: She is oriented to person, place, and time. She appears well-developed and well-nourished.   HENT:   Head: Normocephalic and atraumatic.   Eyes: Conjunctivae and EOM are normal. Pupils are equal, round, and reactive to light.   Neck: Normal range of motion.   Cardiovascular: Normal rate and regular rhythm.    Pulmonary/Chest: Effort normal and breath sounds normal. No respiratory distress. She has no wheezes. She has no rales. She exhibits no tenderness.   Abdominal: Soft. Bowel sounds are normal. There is no rebound and no guarding.    Musculoskeletal: Normal range of motion. She exhibits no edema or tenderness.   Lymphadenopathy:     She has no cervical adenopathy.   Neurological: She is alert and oriented to person, place, and time.   Skin: Skin is warm and dry.   Psychiatric: She has a normal mood and affect.   Nursing note and vitals reviewed.       MDM  Number of Diagnoses or Management Options  Diagnosis management comments: 11:37 AM  Patient's troponin was slightly elevated at 0.11.  I will give her some aspirin.  Her EKG was unremarkable.  We will get a repeat troponin to see if it is changing.    1:47 PM  Patient's repeat troponin was 0.11.  Wheezes remained stable I am not certain she is having a cardiac issue.  I will discuss the case with on-call cardiology.    1:52 PM  I spoke with Dr. Archie BalboaN Freeman who kindly agreed to see the patient.    ED Course       Procedures

## 2015-07-06 NOTE — Progress Notes (Signed)
Bedside and Verbal report given to Brittany, RN.

## 2015-07-06 NOTE — Progress Notes (Cosign Needed Addendum)
Dual skin assessment performed pt with no abnormalities noted sacrum and heels intact with no breakdown

## 2015-07-07 LAB — METABOLIC PANEL, BASIC
Anion gap: 10 mmol/L (ref 7–16)
BUN: 16 MG/DL (ref 8–23)
CO2: 25 mmol/L (ref 21–32)
Calcium: 8.1 MG/DL — ABNORMAL LOW (ref 8.3–10.4)
Chloride: 107 mmol/L (ref 98–107)
Creatinine: 0.83 MG/DL (ref 0.6–1.0)
GFR est AA: >60 mL/min/{1.73_m2} (ref >60)
GFR est non-AA: >60 mL/min/{1.73_m2} (ref >60)
Glucose: 118 mg/dL — ABNORMAL HIGH (ref 65–100)
Potassium: 3.6 mmol/L (ref 3.5–5.1)
Sodium: 142 mmol/L (ref 136–145)

## 2015-07-07 LAB — LIPID PANEL
CHOL/HDL Ratio: 2.5
Cholesterol, total: 146 MG/DL (ref <200)
HDL Cholesterol: 59 MG/DL (ref 40–60)
LDL, calculated: 78.4 MG/DL (ref <100)
Triglyceride: 43 MG/DL (ref 35–150)
VLDL, calculated: 8.6 MG/DL (ref 6.0–23.0)

## 2015-07-07 LAB — CBC WITH AUTOMATED DIFF
ABS. BASOPHILS: 0 10*3/uL (ref 0.0–0.2)
ABS. EOSINOPHILS: 0.2 10*3/uL (ref 0.0–0.8)
ABS. IMM. GRANS.: 0 10*3/uL (ref 0.0–0.5)
ABS. LYMPHOCYTES: 1.6 10*3/uL (ref 0.5–4.6)
ABS. MONOCYTES: 0.3 10*3/uL (ref 0.1–1.3)
ABS. NEUTROPHILS: 1.5 10*3/uL — ABNORMAL LOW (ref 1.7–8.2)
BASOPHILS: 0 % (ref 0.0–2.0)
EOSINOPHILS: 4 % (ref 0.5–7.8)
HCT: 38.9 % (ref 35.8–46.3)
HGB: 13.2 g/dL (ref 11.7–15.4)
IMMATURE GRANULOCYTES: 0 % (ref 0.0–5.0)
LYMPHOCYTES: 44 % (ref 13–44)
MCH: 29.4 PG (ref 26.1–32.9)
MCHC: 33.9 g/dL (ref 31.4–35.0)
MCV: 86.6 FL (ref 79.6–97.8)
MONOCYTES: 9 % (ref 4.0–12.0)
MPV: 12 FL (ref 10.8–14.1)
NEUTROPHILS: 43 % (ref 43–78)
PLATELET: 91 10*3/uL — ABNORMAL LOW (ref 150–450)
RBC: 4.49 M/uL (ref 4.05–5.25)
RDW: 13.7 % (ref 11.9–14.6)
WBC: 3.5 10*3/uL — ABNORMAL LOW (ref 4.3–11.1)

## 2015-07-07 LAB — TROPONIN I: Troponin-I, Qt.: 0.09 NG/ML — ABNORMAL HIGH (ref 0.02–0.05)

## 2015-07-07 LAB — PTT
aPTT: >110 s — CR (ref 23.5–31.7)
aPTT: 88.2 s — ABNORMAL HIGH (ref 23.5–31.7)
aPTT: 65.2 s — ABNORMAL HIGH (ref 23.5–31.7)

## 2015-07-07 MED ORDER — SODIUM CHLORIDE 0.9 % IV
INTRAVENOUS | Status: DC
Start: 2015-07-07 — End: 2015-07-08
  Administered 2015-07-08: 04:00:00 via INTRAVENOUS

## 2015-07-07 MED ORDER — ACETAMINOPHEN 325 MG TABLET
325 mg | Freq: Four times a day (QID) | ORAL | Status: DC | PRN
Start: 2015-07-07 — End: 2015-07-08
  Administered 2015-07-07: 15:00:00 via ORAL

## 2015-07-07 MED ORDER — METOPROLOL TARTRATE 25 MG TAB
25 mg | Freq: Two times a day (BID) | ORAL | Status: DC
Start: 2015-07-07 — End: 2015-07-07

## 2015-07-07 MED FILL — METOPROLOL TARTRATE 25 MG TAB: 25 mg | ORAL | Qty: 1

## 2015-07-07 MED FILL — ATORVASTATIN 40 MG TAB: 40 mg | ORAL | Qty: 2

## 2015-07-07 MED FILL — NITRO-BID 2 % TRANSDERMAL OINTMENT: 2 % | TRANSDERMAL | Qty: 1

## 2015-07-07 MED FILL — SODIUM CHLORIDE 0.9 % IV: INTRAVENOUS | Qty: 1000

## 2015-07-07 MED FILL — TYLENOL 325 MG TABLET: 325 mg | ORAL | Qty: 2

## 2015-07-07 MED FILL — ASPIRIN 81 MG CHEWABLE TAB: 81 mg | ORAL | Qty: 1

## 2015-07-07 MED FILL — LISINOPRIL-HYDROCHLOROTHIAZIDE 20 MG-12.5 MG TAB: ORAL | Qty: 1

## 2015-07-07 MED FILL — HEPARIN (PORCINE) IN D5W 25,000 UNIT/500 ML IV: 25000 unit/500 mL (50 unit/mL) | INTRAVENOUS | Qty: 500

## 2015-07-07 NOTE — Progress Notes (Signed)
[facility] PROGRESS NOTE           07/07/2015 7:18 AM    Admit Date: 07/06/2015      Subjective:   No further chest pain . Noted bradycardia, not receiving BB due to slow HR.     ROS:  Cardiovascular:  As noted above    Objective:      Vitals:    07/06/15 1748 07/06/15 2048 07/07/15 0044 07/07/15 0427   BP:  104/50 119/53 135/64   Pulse: 64 (!) 53 (!) 51 (!) 51   Resp:  Temp:  97.6 ??F (36.4 ??C) 98 ??F (36.7 ??C) 97.5 ??F (36.4 ??C)   SpO2:  97% 97% 96%   Weight:    98.9 kg (218 lb 1.6 oz)   Height:           Physical Exam:  General-No Acute Distress  Neck- supple, no JVD  CV- regular rate and rhythm no MRG  Lung- clear bilaterally  Abd- soft, nontender, nondistended  Ext- no edema bilaterally.  Skin- warm and dry    Data Review:   Recent Labs      07/07/15   0515  07/06/15   2130  07/06/15   1850   07/06/15   1040   NA  142   --    --    --   139   K  3.6   --    --    --   4.7   BUN  16   --    --    --   12   CREA  0.83   --    --    --   0.96   GLU  118*   --    --    --   91   WBC  3.5*   --    --    --   4.1*   HGB  13.2   --    --    --   14.5   HCT  38.9   --    --    --   43.2   PLT  91*   --    --    --   153   TROIQ   --   0.09*  0.10*   < >  0.11*   CHOL  146   --    --    --    --    TGL  43   --    --    --    --    LDLC  78.4   --    --    --    --    HDL  59   --    --    --    --     < > = values in this interval not displayed.       Assessment/Plan:     Principal Problem:    Chest pain (07/06/2015)--in setting of elev trop, htn, dm--plans for [facility] tomorrow.     Active Problems:    HTN (hypertension) (07/06/2015)--stable on current meds, stopped BB due to bradycardia      Diabetes mellitus type 2, controlled (HCC) (07/06/2015)--controlled, monitoring.       Elevated troponin (07/06/2015)      Hip pain (07/06/2015)--improved      Unstable angina (HCC) (07/06/2015)  Kirk RuthsMichael D Casimer Russett, NP  07/07/2015 7:18 AM

## 2015-07-07 NOTE — Progress Notes (Signed)
Bedside shift change report received from Andrew Rainer, RN. Report included the following information SBAR, Kardex, MAR and Recent Results.

## 2015-07-07 NOTE — Progress Notes (Signed)
Shift report received from [place], [place].

## 2015-07-07 NOTE — Progress Notes (Signed)
Problem: Falls - Risk of  Goal: *Absence of falls  Outcome: Progressing Towards Goal  Patient agrees to use call light to call for assistance when getting out of bed. Call light in place.

## 2015-07-07 NOTE — Progress Notes (Signed)
Written report given to Lura Culclasure, RN to be passed on to day shift nurse.

## 2015-07-07 NOTE — Progress Notes (Signed)
Bedside and Verbal shift change report given to Grenada[place]ittany RN (Cabin crewoncoming nurse) by self Physiological scientist(offgoing nurse). Report included the following information SBAR and Med Rec Status.

## 2015-07-08 ENCOUNTER — Ambulatory Visit: Payer: PRIVATE HEALTH INSURANCE | Primary: Family Medicine

## 2015-07-08 LAB — PTT
aPTT: 59.3 s — ABNORMAL HIGH (ref 23.5–31.7)
aPTT: 59.1 s — ABNORMAL HIGH (ref 23.5–31.7)

## 2015-07-08 LAB — GLUCOSE, POC: Glucose (POC): 113 mg/dL — ABNORMAL HIGH (ref 65–100)

## 2015-07-08 MED ORDER — HEPARIN (PORCINE) 10,000 UNIT/ML IJ SOLN
10000 unit/mL | INTRAMUSCULAR | Status: AC
Start: 2015-07-08 — End: ?

## 2015-07-08 MED ORDER — ASPIRIN 81 MG CHEWABLE TAB
81 mg | ORAL_TABLET | Freq: Every day | ORAL | 11 refills | Status: DC
Start: 2015-07-08 — End: 2019-11-20

## 2015-07-08 MED ORDER — IOPAMIDOL 76 % IV SOLN
370 mg iodine /mL (76 %) | Freq: Once | INTRAVENOUS | Status: AC
Start: 2015-07-08 — End: 2015-07-08
  Administered 2015-07-08: 19:00:00 via INTRAVENOUS

## 2015-07-08 MED ORDER — SODIUM CHLORIDE 0.9 % IJ SYRG
Freq: Three times a day (TID) | INTRAMUSCULAR | Status: DC
Start: 2015-07-08 — End: 2015-07-08
  Administered 2015-07-08: 21:00:00 via INTRAVENOUS

## 2015-07-08 MED ORDER — MIDAZOLAM 1 MG/ML IJ SOLN
1 mg/mL | INTRAMUSCULAR | Status: AC
Start: 2015-07-08 — End: ?

## 2015-07-08 MED ORDER — ONDANSETRON (PF) 4 MG/2 ML INJECTION
4 mg/2 mL | Freq: Once | INTRAMUSCULAR | Status: DC | PRN
Start: 2015-07-08 — End: 2015-07-08

## 2015-07-08 MED ORDER — VERAPAMIL 2.5 MG/ML IV
2.5 mg/mL | Freq: Once | INTRAVENOUS | Status: AC
Start: 2015-07-08 — End: 2015-07-08
  Administered 2015-07-08: 19:00:00 via INTRA_ARTERIAL

## 2015-07-08 MED ORDER — NITROGLYCERIN 0.2MG/ML SYRINGE
0.2 mg/mL | INTRAMUSCULAR | Status: AC
Start: 2015-07-08 — End: ?

## 2015-07-08 MED ORDER — ACETAMINOPHEN 325 MG TABLET
325 mg | ORAL | Status: DC | PRN
Start: 2015-07-08 — End: 2015-07-08

## 2015-07-08 MED ORDER — SODIUM CHLORIDE 0.9 % IV
INTRAVENOUS | Status: DC
Start: 2015-07-08 — End: 2015-07-08
  Administered 2015-07-08: 20:00:00 via INTRAVENOUS

## 2015-07-08 MED ORDER — HEPARIN (PORCINE) IN NS (PF) 2,000 UNIT/1,000 ML IV
2000 unit/1,000 mL | INTRAVENOUS | Status: AC
Start: 2015-07-08 — End: ?

## 2015-07-08 MED ORDER — ATORVASTATIN 20 MG TAB
20 mg | ORAL_TABLET | Freq: Every evening | ORAL | 6 refills | Status: DC
Start: 2015-07-08 — End: 2016-01-30

## 2015-07-08 MED ORDER — HEPARIN (PORCINE) IN NS (PF) 2,000 UNIT/1,000 ML IV
2000 unit/1,000 mL | INTRAVENOUS | Status: DC
Start: 2015-07-08 — End: 2015-07-08
  Administered 2015-07-08: 19:00:00 via INTRA_ARTERIAL

## 2015-07-08 MED ORDER — VERAPAMIL 2.5 MG/ML IV
2.5 mg/mL | INTRAVENOUS | Status: AC
Start: 2015-07-08 — End: ?

## 2015-07-08 MED ORDER — MIDAZOLAM 1 MG/ML IJ SOLN
1 mg/mL | INTRAMUSCULAR | Status: DC | PRN
Start: 2015-07-08 — End: 2015-07-08
  Administered 2015-07-08: 19:00:00 via INTRAVENOUS

## 2015-07-08 MED ORDER — HYDROCODONE-ACETAMINOPHEN 5 MG-325 MG TAB
5-325 mg | ORAL | Status: DC | PRN
Start: 2015-07-08 — End: 2015-07-08

## 2015-07-08 MED ORDER — SODIUM CHLORIDE 0.9 % IJ SYRG
INTRAMUSCULAR | Status: DC | PRN
Start: 2015-07-08 — End: 2015-07-08

## 2015-07-08 MED ORDER — FENTANYL CITRATE (PF) 50 MCG/ML IJ SOLN
50 mcg/mL | INTRAMUSCULAR | Status: DC | PRN
Start: 2015-07-08 — End: 2015-07-08
  Administered 2015-07-08: 19:00:00 via INTRAVENOUS

## 2015-07-08 MED ORDER — LIDOCAINE HCL 2 % (20 MG/ML) IJ SOLN
20 mg/mL (2 %) | INTRAMUSCULAR | Status: AC
Start: 2015-07-08 — End: ?

## 2015-07-08 MED ORDER — LIDOCAINE HCL 2 % (20 MG/ML) IJ SOLN
20 mg/mL (2 %) | INTRAMUSCULAR | Status: DC | PRN
Start: 2015-07-08 — End: 2015-07-08

## 2015-07-08 MED ORDER — FENTANYL CITRATE (PF) 50 MCG/ML IJ SOLN
50 mcg/mL | INTRAMUSCULAR | Status: AC
Start: 2015-07-08 — End: ?

## 2015-07-08 MED ORDER — IOPAMIDOL 76 % IV SOLN
370 mg iodine /mL (76 %) | INTRAVENOUS | Status: AC
Start: 2015-07-08 — End: ?

## 2015-07-08 MED FILL — XYLOCAINE 20 MG/ML (2 %) INJECTION SOLUTION: 20 mg/mL (2 %) | INTRAMUSCULAR | Qty: 20

## 2015-07-08 MED FILL — SODIUM CHLORIDE 0.9 % IV: INTRAVENOUS | Qty: 1000

## 2015-07-08 MED FILL — ATORVASTATIN 40 MG TAB: 40 mg | ORAL | Qty: 2

## 2015-07-08 MED FILL — MIDAZOLAM 1 MG/ML IJ SOLN: 1 mg/mL | INTRAMUSCULAR | Qty: 2

## 2015-07-08 MED FILL — FENTANYL CITRATE (PF) 50 MCG/ML IJ SOLN: 50 mcg/mL | INTRAMUSCULAR | Qty: 2

## 2015-07-08 MED FILL — NITRO-BID 2 % TRANSDERMAL OINTMENT: 2 % | TRANSDERMAL | Qty: 1

## 2015-07-08 MED FILL — ASPIRIN 81 MG CHEWABLE TAB: 81 mg | ORAL | Qty: 1

## 2015-07-08 MED FILL — NITROGLYCERIN 0.2MG/ML SYRINGE: 0.2 mg/mL | INTRAMUSCULAR | Qty: 2

## 2015-07-08 MED FILL — LISINOPRIL-HYDROCHLOROTHIAZIDE 20 MG-12.5 MG TAB: ORAL | Qty: 1

## 2015-07-08 MED FILL — HEPARIN (PORCINE) IN NS (PF) 2,000 UNIT/1,000 ML IV: 2000 unit/1,000 mL | INTRAVENOUS | Qty: 1000

## 2015-07-08 MED FILL — HEPARIN (PORCINE) 10,000 UNIT/ML IJ SOLN: 10000 unit/mL | INTRAMUSCULAR | Qty: 1

## 2015-07-08 MED FILL — VERAPAMIL 2.5 MG/ML IV: 2.5 mg/mL | INTRAVENOUS | Qty: 2

## 2015-07-08 MED FILL — ISOVUE-370  76 % INTRAVENOUS SOLUTION: 370 mg iodine /mL (76 %) | INTRAVENOUS | Qty: 400

## 2015-07-08 NOTE — Progress Notes (Cosign Needed)
Applied R-band to right wrist with 12 mL of air in band. Site without bleeding or hematoma. Capillary refill distal to the site was less than 2 seconds. Patient instructed to limit movement of affected wrist. Patient verbalized understanding.

## 2015-07-08 NOTE — Progress Notes (Signed)
TRANSFER - OUT REPORT:    Verbal [facility] on [facility] Chavez  being transferred to cath lab for ordered procedure       Report consisted of patient???s Situation, Background, Assessment and   Recommendations(SBAR).     Information from the following report(s) Kardex, Intake/Output and MAR was reviewed with the receiving nurse.    Lines:   Peripheral IV 07/06/15 Left Antecubital (Active)   Site Assessment Clean, dry, & intact 07/08/2015 11:48 AM   Phlebitis Assessment 0 07/08/2015 11:48 AM   Infiltration Assessment 0 07/08/2015 11:48 AM   Dressing Status Clean, dry, & intact 07/08/2015 11:48 AM   Dressing Type Transparent;Tape 07/08/2015 11:48 AM   Hub Color/Line Status Infusing 07/08/2015 11:48 AM   Action Taken Open ports on tubing capped 07/08/2015  4:51 AM   Alcohol Cap Used Yes 07/08/2015 11:48 AM       Peripheral IV 07/08/15 Right Forearm (Active)   Site Assessment Clean, dry, & intact 07/08/2015 11:48 AM   Phlebitis Assessment 0 07/08/2015 11:48 AM   Infiltration Assessment 0 07/08/2015 11:48 AM   Dressing Status Clean, dry, & intact 07/08/2015 11:48 AM   Dressing Type Tape;Transparent 07/08/2015 11:48 AM   Hub Color/Line Status Capped 07/08/2015 11:48 AM   Alcohol Cap Used Yes 07/08/2015 11:48 AM        Opportunity for questions and clarification was provided.

## 2015-07-08 NOTE — Progress Notes (Signed)
Problem: Falls - Risk of  Goal: *Knowledge of fall prevention  Outcome: Progressing Towards Goal  Gripper socks on. Call bell in reach. Patient instructed to call for assistance; verbalized understanding.

## 2015-07-08 NOTE — Progress Notes (Signed)
TRANSFER - IN REPORT:    [facility] on Sarah Chavez  being received from cath lab for routine progression of care      Report consisted of patient???s Situation, Background, Assessment and   Recommendations(SBAR).     Information from the following report(s) Procedure Summary was reviewed with the receiving nurse.    Opportunity for questions and clarification was provided.      Assessment completed upon patient???s arrival to unit and care assumed.     Patient to floor from cath lab. Patient placed on monitor and eagle for frequent vital signs. Right radial site with R band intact without bleeding or hematoma. Pulses palpable. Patient instructed to limit use of right wrist/hand; patient verbalized understanding. Will continue to monitor.

## 2015-07-08 NOTE — Progress Notes (Signed)
Back to room 302 via bed, accompanied by patient transport.  Right wrist without bleeding or hematoma, R band intact.  Denies pain.

## 2015-07-08 NOTE — Procedures (Signed)
[place]       Name:  Sarah Chavez, Sarah Chavez   MR#:  [id number]   DOB:  1941-09-04   Account #:  [id number]   Date of Adm:  07/06/2015       DATE OF PROCEDURE: 07/08/2015.    PROCEDURES: Left heart catheterization, selective coronary   arteriography, left ventriculogram via the right radial artery.    INDICATION: Chest pain concerning for unstable angina. Mildly   elevated cardiac biomarkers. Cardiac catheterization arranged by   Dr. Kristeen Miss.      TECHNICAL FACTORS: After informed consent was obtained, the   patient was brought to the cardiac catheterization lab. The   right radial artery was prepped and draped in the usual sterile   fashion. Utilizing modified Seldinger technique and a   micropuncture kit, the right radial artery was entered. A 6-  French Slender sheath was placed without difficulty. A radial   cocktail consisting of 2000 units heparin, 2 mg verapamil, 200   mcg nitroglycerin was administered. A 5-French Tiger 4.0   catheter was used to selectively engage the ostium of the left   main coronary artery and right coronary artery respectively.   Selective injections in various projections were performed. A   pigtail catheter was used to cross the aortic valve and enter   the left ventricle. Hemodynamic measurements and left   ventriculogram were obtained. Left ventricular aortic pressure   gradient was obtained by pullback technique. At the conclusion   of diagnostic procedure, the radial sheath was removed and a   pneumatic band was placed with excellent hemostasis. No   complications were encountered.      CONTRAST: Isovue 80.      HEMODYNAMIC RESULTS:   1. Aortic pressure was 188/63 with a mean of 93.   2. Left ventricular end-diastolic pressure was 14.   3. There was no significant gradient across the aortic valve.      ANGIOGRAPHIC RESULTS:   1. Left main coronary artery: Large caliber vessel.   Angiographically normal.    2. LAD: Medium caliber vessel, 20% proximal and 30% distal   stenosis.   3. First diagonal artery: Medium caliber vessel. Patent.   4. Left circumflex: Medium caliber, nondominant vessel. Patent.   5. First obtuse marginal: Medium caliber vessel. Patent.   6. Second obtuse marginal artery: Small caliber vessel. Patent.   7. Third obtuse marginal: Small caliber vessel. Patent.   8. Right coronary artery: Medium caliber, dominant vessel. Ten   to 20% proximal luminal irregularities.   9. Right PDA: Medium caliber vessel, 20% ostial stenosis.   10. Right posterolateral branch: Medium caliber vessel. Patent.   11. Left ventriculogram performed in RAO projection shows normal   left ventricular systolic function, EF 46% to 70%. No focal   segmental wall motion abnormalities. Aortic root is nondilated.   No mitral regurgitation.    CONCLUSION:   1. Mild nonobstructive coronary artery disease.   2. Normal left ventricular systolic function.    PLAN: Medical therapy.        Renelda Loma, MD      MGN / JZ   D:  07/08/2015   15:18   T:  07/08/2015   15:33   Job #:  [id number]

## 2015-07-08 NOTE — Procedures (Signed)
Brief Cardiac Procedure Note    Patient: Sarah BoerHarvey D Vest MRN: [id number]  SSN: [id number]    Date of Birth: 12/20/1934  Age: 74 y.o.  Sex: female      Date of Procedure: 07/08/2015     Pre-procedure Diagnosis: Chest pain    Post-procedure Diagnosis: Non-cardiac chest pain    Procedure: Left Heart Catheterization    Brief Description of Procedure: As above    Performed By: Joelene MillinMatthew G Shloimy Michalski, MD     Assistants: None    Anesthesia: Moderate Sedation    Estimated Blood Loss: Less than 10 mL      Specimens: None    Implants: None    Findings: Mild non-obstructive CAD.    Complications: None    Recommendations: Continue medical therapy.    Signed By: Joelene MillinMatthew G Tanae Petrosky, MD     July 08, 2015

## 2015-07-08 NOTE — Procedures (Signed)
[place]       Name:  Sarah Chavez, Sarah Chavez   MR#:  [id number]   DOB:  Nov 27, 1940   Account #:  [id number]   Date of Adm:  07/06/2015       DATE OF PROCEDURE: 07/08/2015.    PROCEDURES: Left heart catheterization, selective coronary   arteriography, left ventriculogram via the right radial artery.    INDICATION: Chest pain concerning for unstable angina. Mildly   elevated cardiac biomarkers. Cardiac catheterization arranged by   Dr. Kristeen Miss.      TECHNICAL FACTORS: After informed consent was obtained, the   patient was brought to the cardiac catheterization lab. The   right radial artery was prepped and draped in the usual sterile   fashion. Utilizing modified Seldinger technique and a   micropuncture kit, the right radial artery was entered. A 6-  French Slender sheath was placed without difficulty. A radial   cocktail consisting of 2000 units heparin, 2 mg verapamil, 200   mcg nitroglycerin was administered. A 5-French Tiger 4.0   catheter was used to selectively engage the ostium of the left   main coronary artery and right coronary artery respectively.   Selective injections in various projections were performed. A   pigtail catheter was used to cross the aortic valve and enter   the left ventricle. Hemodynamic measurements and left   ventriculogram were obtained. Left ventricular aortic pressure   gradient was obtained by pullback technique. At the conclusion   of diagnostic procedure, the radial sheath was removed and a   pneumatic band was placed with excellent hemostasis. No   complications were encountered.      CONTRAST: Isovue 80.      HEMODYNAMIC RESULTS:   1. Aortic pressure was 188/63 with a mean of 93.   2. Left ventricular end-diastolic pressure was 14.   3. There was no significant gradient across the aortic valve.      ANGIOGRAPHIC RESULTS:   1. Left main coronary artery: Large caliber vessel.   Angiographically normal.   2. LAD: Medium caliber  vessel, 20% proximal and 30% distal   stenosis.   3. First diagonal artery: Medium caliber vessel. Patent.   4. Left circumflex: Medium caliber, nondominant vessel. Patent.   5. First obtuse marginal: Medium caliber vessel. Patent.   6. Second obtuse marginal artery: Small caliber vessel. Patent.   7. Third obtuse marginal: Small caliber vessel. Patent.   8. Right coronary artery: Medium caliber, dominant vessel. Ten   to 20% proximal luminal irregularities.   9. Right PDA: Medium caliber vessel, 20% ostial stenosis.   10. Right posterolateral branch: Medium caliber vessel. Patent.   11. Left ventriculogram performed in RAO projection shows normal   left ventricular systolic function, EF 02% to 70%. No focal   segmental wall motion abnormalities. Aortic root is nondilated.   No mitral regurgitation.    CONCLUSION:   1. Mild nonobstructive coronary artery disease.   2. Normal left ventricular systolic function.    PLAN: Medical therapy.        Renelda Loma, MD      MGN / JZ   D:  07/08/2015   15:18   T:  07/08/2015   15:33   Job #:  [id number]

## 2015-07-08 NOTE — Progress Notes (Signed)
TRANSFER - OUT REPORT:    Verbal [facility] and Kennon RoundsSally RN(name) on [name]  being transferred to 3rd Floor Tele and CPRU(unit) for routine progression of care       Report consisted of patient???s Situation, Background, Assessment and   Recommendations(SBAR).     Information from the following report(s) SBAR and Procedure Summary was reviewed with the receiving nurse.    Opportunity for questions and clarification was provided.      Procedure: LHC   Finding Summary: Diagnostic Only(cath/pci/pacer settings)  Location: R Wrist    Closure Device: 11 ml R Band(yes/no/description)  Post Site Assessment: no oozing or hematoma       Intra Procedure Meds:    Versed: 1mg   Fentanyl: 25 mcg  Heparin: 2000 units  Angiomax Stop Time: na  Reopro:  na  Integrelin: na  Antiplatelet: na             Peripheral IV 07/06/15 Left Antecubital (Active)   Site Assessment Clean, dry, & intact 07/08/2015 11:48 AM   Phlebitis Assessment 0 07/08/2015 11:48 AM   Infiltration Assessment 0 07/08/2015 11:48 AM   Dressing Status Clean, dry, & intact 07/08/2015 11:48 AM   Dressing Type Transparent;Tape 07/08/2015 11:48 AM   Hub Color/Line Status Infusing 07/08/2015 11:48 AM   Action Taken Open ports on tubing capped 07/08/2015  4:51 AM   Alcohol Cap Used Yes 07/08/2015 11:48 AM       Peripheral IV 07/08/15 Right Forearm (Active)   Site Assessment Clean, dry, & intact 07/08/2015 11:48 AM   Phlebitis Assessment 0 07/08/2015 11:48 AM   Infiltration Assessment 0 07/08/2015 11:48 AM   Dressing Status Clean, dry, & intact 07/08/2015 11:48 AM   Dressing Type Tape;Transparent 07/08/2015 11:48 AM   Hub Color/Line Status Capped 07/08/2015 11:48 AM   Alcohol Cap Used Yes 07/08/2015 11:48 AM                                is allergic to ativan [lorazepam]; ativan [lorazepam]; other medication; and other medication.    Past Medical History   Diagnosis Date   ??? CAD (coronary artery disease)    ??? Diabetes (HCC)    ??? Endocrine disease       thyroid   ??? Hypertension    ??? Other ill-defined conditions(799.89)      cholesterol   ??? Other ill-defined conditions(799.89)      heart cath 96   ??? Unstable angina (HCC) 07/06/2015     Visit Vitals   ??? BP 125/65   ??? Pulse (!) 54   ??? Temp 98.2 ??F (36.8 ??C)   ??? Resp 18   ??? Ht 5\' 2"  (1.575 m)   ??? Wt 98.9 kg (218 lb)   ??? SpO2 97%   ??? Breastfeeding No   ??? BMI 39.87 kg/m2

## 2015-07-08 NOTE — Progress Notes (Signed)
IV heparin rate has been adjusted based on the most recent PTT results.    Lab Results   Component Value Date/Time    APTT 59.1 07/08/2015 06:48 AM     No rate change. Next PTT due at 1345.

## 2015-07-08 NOTE — Progress Notes (Signed)
Bedside shift change report given to Lynn Burns, RN. Report included the following information SBAR, Kardex, MAR and Recent Results.

## 2015-07-08 NOTE — Progress Notes (Signed)
Discharge instructions given and reviewed with patient and daughter at bedside.  Prescriptions given, questions answered.  Discharged home.

## 2015-07-08 NOTE — Procedures (Signed)
Brief Cardiac Procedure Note    Patient: Sarah Chavez MRN: [id number]  SSN: [id number]    Date of Birth: 12/20/1934  Age: 74 y.o.  Sex: female      Date of Procedure: 07/08/2015     Pre-procedure Diagnosis: Chest pain    Post-procedure Diagnosis: Non-cardiac chest pain    Procedure: Left Heart Catheterization    Brief Description of Procedure: As above    Performed By: Lorena Clearman G Jhoselin Crume, MD     Assistants: None    Anesthesia: Moderate Sedation    Estimated Blood Loss: Less than 10 mL      Specimens: None    Implants: None    Findings: Mild non-obstructive CAD.    Complications: None    Recommendations: Continue medical therapy.    Signed By: Kaidon Kinker G Malaky Tetrault, MD     July 08, 2015

## 2015-07-08 NOTE — Progress Notes (Signed)
Report received from Brittany Smith RN. Assumed patient care.

## 2015-07-08 NOTE — Progress Notes (Signed)
Discharge pending successful removal of R band post LHC.

## 2015-07-08 NOTE — Progress Notes (Signed)
[facility] PROGRESS NOTE           07/08/2015 8:26 AM    Admit Date: 07/06/2015      Subjective:   No further chest pain. Noted troponin trend down. Plans for LHC today to fully investigate.     ROS:  Cardiovascular:  As noted above    Objective:      Vitals:    07/07/15 2041 07/07/15 2109 07/08/15 0034 07/08/15 0458   BP: 96/44  134/53 131/61   Pulse: (!) 58  (!) 57 64   Resp: 18  18 18    Temp: 98.1 ??F (36.7 ??C)  97.8 ??F (36.6 ??C) 98 ??F (36.7 ??C)   SpO2: 97% 95% 98% 100%   Weight:    98.9 kg (218 lb)   Height:           Physical Exam:  General-No Acute Distress  Neck- supple, no JVD  CV- regular rate and rhythm no MRG  Lung- clear bilaterally  Abd- soft, nontender, nondistended  Ext- no edema bilaterally.  Skin- warm and dry    Data Review:   Recent Labs      07/07/15   0515  07/06/15   2130  07/06/15   1850   07/06/15   1040   NA  142   --    --    --   139   K  3.6   --    --    --   4.7   BUN  16   --    --    --   12   CREA  0.83   --    --    --   0.96   GLU  118*   --    --    --   91   WBC  3.5*   --    --    --   4.1*   HGB  13.2   --    --    --   14.5   HCT  38.9   --    --    --   43.2   PLT  91*   --    --    --   153   TROIQ   --   0.09*  0.10*   < >  0.11*   CHOL  146   --    --    --    --    TGL  43   --    --    --    --    LDLC  78.4   --    --    --    --    HDL  59   --    --    --    --     < > = values in this interval not displayed.       Assessment/Plan:     Principal Problem:    Chest pain (07/06/2015)--multiple risk factors for CAD, no prior hx of CAD, elevated troponin, possible NSTEMI---cath with possible PCI today--echo today    Active Problems:    HTN (hypertension) (07/06/2015)---controlled on current meds      Diabetes mellitus type 2, controlled (HCC) (07/06/2015)--controlled      Elevated troponin (07/06/2015)--as above      Hip pain (07/06/2015)--improved.       Unstable angina (HCC) (07/06/2015)  Kirk Ruths, NP  07/08/2015 8:26 AM

## 2015-07-22 ENCOUNTER — Ambulatory Visit
Admit: 2015-07-22 | Discharge: 2015-07-22 | Payer: PRIVATE HEALTH INSURANCE | Attending: Cardiovascular Disease | Primary: Family Medicine

## 2015-07-22 DIAGNOSIS — I10 Essential (primary) hypertension: Secondary | ICD-10-CM

## 2015-07-22 NOTE — Progress Notes (Signed)
[address], [address]  [place], [place] [address]  PHONE: [phone]    Sarah Chavez  [birth year]      SUBJECTIVE:   Sarah Chavez is a 74 y.o. female seen for a follow up visit regarding the following:     Chief Complaint   Patient presents with   ??? Hospital Follow Up       HPI:    HPI Comments: 74 year old female returns for follow-up of recent hospitalization for some chest discomfort and hypertension.  Troponin was borderline.  She underwent cardiac catheterization showing only mild nonobstructive CAD with normal LV function.  She was discharged on low-dose statin and meds for diabetes and hypertension.  She has been doing well since discharge with no chest pain.  She has long problem with hip pain has difficulty walking.    Hospital Follow Up   Pertinent negatives include no chest pain.       Past Medical History, Past Surgical History, Family history, Social History, and Medications were all reviewed with the patient today and updated as necessary.     Outpatient Prescriptions Marked as Taking for the 07/22/15 encounter (Office Visit) with Maree Krabbe, MD   Medication Sig Dispense Refill   ??? famotidine (PEPCID) 20 mg tablet   0   ??? meloxicam (MOBIC) 7.5 mg tablet take 1 tablet by mouth once daily  0   ??? aspirin 81 mg chewable tablet Take 1 Tab by mouth daily. 30 Tab 11   ??? atorvastatin (LIPITOR) 20 mg tablet Take 1 Tab by mouth nightly. 30 Tab 6   ??? lisinopril-hydrochlorothiazide (PRINZIDE, ZESTORETIC) 20-12.5 mg per tablet Take 1 Tab by mouth daily.     ??? glipiZIDE (GLUCOTROL) 10 mg tablet Take 25 mg by mouth daily (before breakfast).       Allergies   Allergen Reactions   ??? Ativan [Lorazepam] Other (comments)     Made her feel crazy and chest pressure   ??? Ativan [Lorazepam] Other (comments)     Chest fullness   ??? Other Medication Palpitations     Some type of decongestant   ??? Other Medication Other (comments)     Doesn't take decongestants, but can't recall why     Past Medical History    Diagnosis Date   ??? CAD (coronary artery disease)    ??? Diabetes (HCC)    ??? Endocrine disease      thyroid   ??? Hypertension    ??? Other ill-defined conditions(799.89)      cholesterol   ??? Other ill-defined conditions(799.89)      heart cath 96   ??? Unstable angina (HCC) 07/06/2015     Past Surgical History   Procedure Laterality Date   ??? Hx heent       goiter/thyroid surgery     No family history on file.   Social History   Substance Use Topics   ??? Smoking status: Never Smoker   ??? Smokeless tobacco: Not on file   ??? Alcohol use Not on file       ROS:    Review of Systems   Constitution: Negative for weight loss.   Cardiovascular: Negative for chest pain and dyspnea on exertion.   Musculoskeletal: Positive for arthritis and joint pain.           PHYSICAL EXAM:    Visit Vitals   ??? BP 136/72   ??? Pulse (!) 52   ??? Ht  (1.575 m)   ??? Wt  215 lb (97.5 kg)   ??? BMI 39.32 kg/m2          Wt Readings from Last 3 Encounters:   07/22/15 215 lb (97.5 kg)   07/08/15 218 lb (98.9 kg)   10/06/11 215 lb (97.5 kg)     BP Readings from Last 3 Encounters:   07/22/15 136/72   07/08/15 145/60   10/06/11 141/65         Physical Exam   Constitutional: She appears well-developed. No distress.   obese   Cardiovascular: Normal rate and regular rhythm.    Pulmonary/Chest: Effort normal and breath sounds normal.   Abdominal: Soft. Bowel sounds are normal.   Musculoskeletal: She exhibits no edema.       Medical problems and test results were reviewed with the patient today.     No results found for any visits on 07/22/15.  Lab Results   Component Value Date/Time    SODIUM 142 07/07/2015 05:15 AM    POTASSIUM 3.6 07/07/2015 05:15 AM    CHLORIDE 107 07/07/2015 05:15 AM    CO2 25 07/07/2015 05:15 AM    ANION GAP 10 07/07/2015 05:15 AM    GLUCOSE 118 07/07/2015 05:15 AM    BUN 16 07/07/2015 05:15 AM    CREATININE 0.83 07/07/2015 05:15 AM    GFR EST AA >60 07/07/2015 05:15 AM    GFR EST NON-AA >60 07/07/2015 05:15 AM    CALCIUM 8.1 07/07/2015 05:15 AM      Lab Results   Component Value Date/Time    CHOLESTEROL, TOTAL 146 07/07/2015 05:15 AM    HDL CHOLESTEROL 59 07/07/2015 05:15 AM    LDL, CALCULATED 78.4 07/07/2015 05:15 AM    VLDL, CALCULATED 8.6 07/07/2015 05:15 AM    TRIGLYCERIDE 43 07/07/2015 05:15 AM    CHOL/HDL RATIO 2.5 07/07/2015 05:15 AM         ASSESSMENT and PLAN    Sarah Chavez was seen today for hospital follow up.    Diagnoses and all orders for this visit:    Essential hypertension stable on meds    Coronary artery disease involving native coronary artery of native heart without angina pectoris mild needs lower chol    Dyslipidemia stay on statin  -     LIPID PANEL; Future    Controlled type 2 diabetes mellitus without complication, without long-term current use of insulin (HCC)               Follow-up Disposition:  Return in about 4 months (around 11/19/2015).    Maree KrabbeNed D Madailein Londo, MD  07/22/2015  12:32 PM

## 2015-11-20 ENCOUNTER — Emergency Department: Admit: 2015-11-20 | Payer: PRIVATE HEALTH INSURANCE | Primary: Family Medicine

## 2015-11-20 ENCOUNTER — Inpatient Hospital Stay
Admit: 2015-11-20 | Discharge: 2015-11-20 | Disposition: A | Discharge status: Home / Self Care | Payer: PRIVATE HEALTH INSURANCE | Attending: Emergency Medicine

## 2015-11-20 DIAGNOSIS — M545 Low back pain: Secondary | ICD-10-CM

## 2015-11-20 LAB — URINE MICROSCOPIC
Bacteria: 0 /hpf
Casts: 0 /lpf
RBC: 0 /hpf

## 2015-11-20 MED ORDER — OXYCODONE-ACETAMINOPHEN 5 MG-325 MG TAB
5-325 mg | ORAL_TABLET | Freq: Three times a day (TID) | ORAL | 0 refills | Status: DC | PRN
Start: 2015-11-20 — End: 2016-01-08

## 2015-11-20 MED ORDER — ONDANSETRON 4 MG TAB, RAPID DISSOLVE
4 mg | Freq: Once | ORAL | Status: AC
Start: 2015-11-20 — End: 2015-11-20
  Administered 2015-11-20: 16:00:00 via ORAL

## 2015-11-20 MED ORDER — OXYCODONE-ACETAMINOPHEN 5 MG-325 MG TAB
5-325 mg | ORAL | Status: AC
Start: 2015-11-20 — End: 2015-11-20
  Administered 2015-11-20: 16:00:00 via ORAL

## 2015-11-20 MED FILL — OXYCODONE-ACETAMINOPHEN 5 MG-325 MG TAB: 5-325 mg | ORAL | Qty: 1

## 2015-11-20 MED FILL — ONDANSETRON 4 MG TAB, RAPID DISSOLVE: 4 mg | ORAL | Qty: 1

## 2015-11-20 NOTE — ED Triage Notes (Signed)
C/o mid/lt sided lower back pain since yesterday. Hx of back pain. Denies injury

## 2015-11-20 NOTE — ED Provider Notes (Signed)
HPI Comments: Patient states back hurting this morning and having difficulty getting up and walking secondary to pain and stiffness. She has a history of arthritis. Has pain in shoulders, hips, knees. Had shots in her right shoulder and hip in December. Helped a little. Has not had any joint replacements. No urinary or bowel symptoms. No weakness or numbness. Ambulates with cane or sometimes a walker. No fall or injury. She is on Mobic.     Patient is a 75 y.o. female presenting with back pain. The history is provided by the patient, medical records and a relative (son and granddaughter).   Back Pain    This is a recurrent problem. The current episode started 6 to 12 hours ago. The problem has not changed since onset.The problem occurs constantly. Patient reports not work related injury.The pain is associated with no known injury. The pain is present in the lower back and left side. The quality of the pain is described as sharp. The pain does not radiate. The pain is moderate. The symptoms are aggravated by bending, twisting and certain positions. The pain is the same all the time. Stiffness is present in the morning. Pertinent negatives include no chest pain, no fever, no numbness, no abdominal pain, no bladder incontinence, no dysuria, no paresthesias, no paresis, no tingling and no weakness. She has tried NSAIDs for the symptoms. Risk factors include obesity.        Past Medical History:   Diagnosis Date   ??? Abdominal pain 07/22/2015   ??? Anemia    ??? Back pain    ??? Bursitis of shoulder    ??? CAD (coronary artery disease)    ??? Cerumen impaction    ??? Chest pain, atypical 07/22/2015   ??? Diabetes (HCC)    ??? Diabetes mellitus out of control (HCC)    ??? Endocrine disease     thyroid   ??? Fatigue    ??? Hip pain, right    ??? Hyperlipidemia, mild    ??? Hypertension    ??? Hypertension associated with diabetes (HCC) 07/22/2015   ??? 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 Left carotid bruit     ??? Lumbago 07/22/2015   ??? Morbid obesity (HCC) 07/22/2015   ??? OSA on CPAP 07/22/2015   ??? Other ill-defined conditions(799.89)     cholesterol   ??? Other ill-defined conditions(799.89)     heart cath 96   ??? Shoulder pain, acute    ??? Shoulder pain, bilateral    ??? Sinusitis, acute maxillary    ??? Unstable angina (HCC) 07/06/2015   ??? Vitamin D deficiency    ??? Weakness        Past Surgical History:   Procedure Laterality Date   ??? HX HEENT      goiter/thyroid surgery   ??? HX THYROIDECTOMY           Family History:   Problem Relation Age of Onset   ??? Stroke Sister    ??? Cancer Sister      cervical   ??? Stroke Brother    ??? Diabetes Other    ??? Hypertension Other        Social History     Social History   ??? Marital status: SINGLE     Spouse name: N/A   ??? Number of children: N/A   ??? Years of education: N/A     Occupational History   ??? Not on file.     Social History  Main Topics   ??? Smoking status: Former Smoker   ??? Smokeless tobacco: Not on file      Comment: quit in 1971   ??? Alcohol use No   ??? Drug use: No   ??? Sexual activity: Not on file     Other Topics Concern   ??? Not on file     Social History Narrative    ** Merged History Encounter **              ALLERGIES: Ativan [lorazepam]; Ativan [lorazepam]; Other medication; and Other medication    Review of Systems   Constitutional: Negative.  Negative for fever.   HENT: Negative.    Respiratory: Negative.    Cardiovascular: Negative.  Negative for chest pain.   Gastrointestinal: Negative.  Negative for abdominal pain.   Genitourinary: Negative.  Negative for bladder incontinence and dysuria.   Musculoskeletal: Positive for arthralgias, back pain, gait problem and joint swelling.   Skin: Negative.    Neurological: Negative for tingling, weakness, numbness and paresthesias.   Hematological: Negative.    Psychiatric/Behavioral: Negative.        Vitals:    11/20/15 1010   BP: 140/86   Pulse: 64   Resp: 14   Temp: 98.1 ??F (36.7 ??C)   SpO2: 95%   Weight: 95.3 kg (210 lb)    Height: 5\' 2"  (1.575 m)            Physical Exam   Constitutional: She is oriented to person, place, and time. She appears well-developed and well-nourished.   obese   HENT:   Head: Normocephalic and atraumatic.   Neck: Normal range of motion. Neck supple.   Cardiovascular: Normal rate, regular rhythm, normal heart sounds and intact distal pulses.    Pulmonary/Chest: Effort normal and breath sounds normal.   Abdominal: Soft. Bowel sounds are normal. She exhibits no mass. There is no tenderness.   Musculoskeletal:   Arthritic changes of both knees. Decreased ROM of hips and knees and shoulders but no hot, red, tender joints today. IP joints of hands arthritic. Spine with no bony tenderness or redness. Negative straight leg raise.    Neurological: She is alert and oriented to person, place, and time. She displays normal reflexes. She exhibits normal muscle tone.   Skin: Skin is warm and dry.   Psychiatric: She has a normal mood and affect. Her behavior is normal.   Nursing note and vitals reviewed.       MDM  ED Course       Procedures    Acute exacerbation back pain  LS spine - degenerative changes  CT abdomen in 2009 reported degenerative spine and SI joints  Urine clear  Percocet 5 with relief. Zofran 4 mg po.  Rx Percocet 5 # 12  Results and instructions discussed with patient and family

## 2015-11-20 NOTE — ED Notes (Signed)
I have reviewed discharge instructions with the patient.  The patient verbalized understanding.

## 2016-01-08 ENCOUNTER — Inpatient Hospital Stay
Admit: 2016-01-08 | Discharge: 2016-01-08 | Disposition: A | Discharge status: Home / Self Care | Payer: PRIVATE HEALTH INSURANCE | Attending: Emergency Medicine

## 2016-01-08 ENCOUNTER — Emergency Department: Admit: 2016-01-08 | Payer: PRIVATE HEALTH INSURANCE | Primary: Family Medicine

## 2016-01-08 DIAGNOSIS — R1084 Generalized abdominal pain: Secondary | ICD-10-CM

## 2016-01-08 LAB — METABOLIC PANEL, COMPREHENSIVE
A-G Ratio: 1 — ABNORMAL LOW (ref 1.2–3.5)
ALT (SGPT): 31 U/L (ref 12–65)
AST (SGOT): 32 U/L (ref 15–37)
Albumin: 3.8 g/dL (ref 3.2–4.6)
Alk. phosphatase: 65 U/L (ref 50–136)
Anion gap: 7 mmol/L (ref 7–16)
BUN: 15 MG/DL (ref 8–23)
Bilirubin, total: 0.9 MG/DL (ref 0.2–1.1)
CO2: 31 mmol/L (ref 21–32)
Calcium: 8.8 MG/DL (ref 8.3–10.4)
Chloride: 106 mmol/L (ref 98–107)
Creatinine: 0.9 MG/DL (ref 0.6–1.0)
GFR est AA: >60 mL/min/{1.73_m2} (ref >60)
GFR est non-AA: >60 mL/min/{1.73_m2} (ref >60)
Globulin: 3.9 g/dL — ABNORMAL HIGH (ref 2.3–3.5)
Glucose: 103 mg/dL — ABNORMAL HIGH (ref 65–100)
Potassium: 4.7 mmol/L (ref 3.5–5.1)
Protein, total: 7.7 g/dL (ref 6.3–8.2)
Sodium: 144 mmol/L (ref 136–145)

## 2016-01-08 LAB — CBC WITH AUTOMATED DIFF
ABS. BASOPHILS: 0 10*3/uL (ref 0.0–0.2)
ABS. EOSINOPHILS: 0.1 10*3/uL (ref 0.0–0.8)
ABS. IMM. GRANS.: 0 10*3/uL (ref 0.0–0.5)
ABS. LYMPHOCYTES: 1.9 10*3/uL (ref 0.5–4.6)
ABS. MONOCYTES: 0.4 10*3/uL (ref 0.1–1.3)
ABS. NEUTROPHILS: 1.7 10*3/uL (ref 1.7–8.2)
BASOPHILS: 1 % (ref 0.0–2.0)
EOSINOPHILS: 3 % (ref 0.5–7.8)
HCT: 42.9 % (ref 35.8–46.3)
HGB: 14 g/dL (ref 11.7–15.4)
IMMATURE GRANULOCYTES: 0 % (ref 0.0–5.0)
LYMPHOCYTES: 46 % — ABNORMAL HIGH (ref 13–44)
MCH: 27.7 PG (ref 26.1–32.9)
MCHC: 32.6 g/dL (ref 31.4–35.0)
MCV: 85 FL (ref 79.6–97.8)
MONOCYTES: 9 % (ref 4.0–12.0)
MPV: 11.7 FL (ref 10.8–14.1)
NEUTROPHILS: 41 % — ABNORMAL LOW (ref 43–78)
PLATELET: 147 10*3/uL — ABNORMAL LOW (ref 150–450)
RBC: 5.05 M/uL (ref 4.05–5.25)
RDW: 13.5 % (ref 11.9–14.6)
WBC: 4 10*3/uL — ABNORMAL LOW (ref 4.3–11.1)

## 2016-01-08 LAB — URINE MICROSCOPIC
Bacteria: 0 /hpf
Casts: 0 /lpf
RBC: 0 /hpf

## 2016-01-08 LAB — LIPASE: Lipase: 154 U/L (ref 73–393)

## 2016-01-08 LAB — POC LACTIC ACID: Lactic Acid (POC): 0.9 mmol/L (ref 0.5–1.9)

## 2016-01-08 MED ORDER — FAMOTIDINE 20 MG TAB
20 mg | ORAL_TABLET | Freq: Every day | ORAL | 0 refills | Status: AC
Start: 2016-01-08 — End: 2016-02-08

## 2016-01-08 MED ORDER — HYOSCYAMINE 0.125 MG SUBLINGUAL TAB
0.125 mg | ORAL_TABLET | Freq: Four times a day (QID) | SUBLINGUAL | 0 refills | Status: DC | PRN
Start: 2016-01-08 — End: 2016-01-30

## 2016-01-08 MED ORDER — MORPHINE 4 MG/ML SYRINGE
4 mg/mL | INTRAMUSCULAR | Status: AC
Start: 2016-01-08 — End: 2016-01-08
  Administered 2016-01-08: 18:00:00 via INTRAVENOUS

## 2016-01-08 MED ORDER — TRAMADOL 50 MG TAB
50 mg | ORAL_TABLET | Freq: Four times a day (QID) | ORAL | 0 refills | Status: DC | PRN
Start: 2016-01-08 — End: 2017-02-16

## 2016-01-08 MED ORDER — SODIUM CHLORIDE 0.9% BOLUS IV
0.9 % | Freq: Once | INTRAVENOUS | Status: AC
Start: 2016-01-08 — End: 2016-01-08
  Administered 2016-01-08: 18:00:00 via INTRAVENOUS

## 2016-01-08 MED ORDER — ONDANSETRON (PF) 4 MG/2 ML INJECTION
4 mg/2 mL | INTRAMUSCULAR | Status: AC
Start: 2016-01-08 — End: 2016-01-08
  Administered 2016-01-08: 18:00:00 via INTRAVENOUS

## 2016-01-08 MED FILL — MORPHINE 4 MG/ML SYRINGE: 4 mg/mL | INTRAMUSCULAR | Qty: 1

## 2016-01-08 MED FILL — ONDANSETRON (PF) 4 MG/2 ML INJECTION: 4 mg/2 mL | INTRAMUSCULAR | Qty: 2

## 2016-01-08 NOTE — ED Notes (Signed)
I have reviewed medications, follow up provider options, and discharge instructions with the patient. The patient verbalized understanding. Copy of discharge information given to patient upon discharge. Prescription(s) given to patient. Patient discharged in no distress.

## 2016-01-08 NOTE — ED Provider Notes (Signed)
Patient is a 75 y.o. female presenting with abdominal pain. The history is provided by the patient.   Abdominal Pain    This is a recurrent problem. The current episode started more than 2 days ago. The problem occurs constantly. The problem has not changed since onset.The pain is associated with an unknown factor. The pain is located in the generalized abdominal region. The pain is moderate. Associated symptoms include nausea, arthralgias and myalgias. Pertinent negatives include no fever, no diarrhea, no vomiting, no constipation, no dysuria, no frequency, no hematuria, no headaches, no trauma, no chest pain and no back pain. The pain is worsened by activity and eating. The pain is relieved by nothing. Her past medical history is significant for UTI. Her past medical history does not include pancreatitis. The patient's surgical history non-contributory.       Past Medical History:   Diagnosis Date   ??? Abdominal pain 07/22/2015   ??? Anemia    ??? Back pain    ??? Bursitis of shoulder    ??? CAD (coronary artery disease)    ??? Cerumen impaction    ??? Chest pain, atypical 07/22/2015   ??? Diabetes (HCC)    ??? Diabetes mellitus out of control (HCC)    ??? Endocrine disease     thyroid   ??? Fatigue    ??? Hip pain, right    ??? Hyperlipidemia, mild    ??? Hypertension    ??? Hypertension associated with diabetes (HCC) 07/22/2015   ??? 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 Left carotid bruit    ??? Lumbago 07/22/2015   ??? Morbid obesity (HCC) 07/22/2015   ??? OSA on CPAP 07/22/2015   ??? Other ill-defined conditions     cholesterol   ??? Other ill-defined conditions     heart cath 96   ??? Shoulder pain, acute    ??? Shoulder pain, bilateral    ??? Sinusitis, acute maxillary    ??? Unstable angina (HCC) 07/06/2015   ??? Vitamin D deficiency    ??? Weakness        Past Surgical History:   Procedure Laterality Date   ??? HX HEENT      goiter/thyroid surgery   ??? HX THYROIDECTOMY           Family History:   Problem Relation Age of Onset    ??? Stroke Sister    ??? Cancer Sister      cervical   ??? Stroke Brother    ??? Diabetes Other    ??? Hypertension Other        Social History     Social History   ??? Marital status: SINGLE     Spouse name: N/A   ??? Number of children: N/A   ??? Years of education: N/A     Occupational History   ??? Not on file.     Social History Main Topics   ??? Smoking status: Former Smoker   ??? Smokeless tobacco: Not on file      Comment: quit in 1971   ??? Alcohol use No   ??? Drug use: No   ??? Sexual activity: Not on file     Other Topics Concern   ??? Not on file     Social History Narrative    ** Merged History Encounter **              ALLERGIES: Ativan [lorazepam]; Ativan [lorazepam]; Other medication; and Other medication    Review  of Systems   Constitutional: Negative for chills and fever.   HENT: Negative for congestion, ear pain and rhinorrhea.    Eyes: Negative for photophobia and discharge.   Respiratory: Negative for cough and shortness of breath.    Cardiovascular: Negative for chest pain and palpitations.   Gastrointestinal: Positive for abdominal pain and nausea. Negative for constipation, diarrhea and vomiting.   Endocrine: Negative for cold intolerance and heat intolerance.   Genitourinary: Negative for dysuria, flank pain, frequency and hematuria.   Musculoskeletal: Positive for arthralgias, joint swelling and myalgias. Negative for back pain and neck pain.   Skin: Negative for rash and wound.   Allergic/Immunologic: Negative for environmental allergies and food allergies.   Neurological: Negative for syncope and headaches.   Hematological: Negative for adenopathy. Does not bruise/bleed easily.   Psychiatric/Behavioral: Negative for dysphoric mood. The patient is not nervous/anxious.    All other systems reviewed and are negative.      Vitals:    01/08/16 1147 01/08/16 1334 01/08/16 1335   BP: 163/77 132/56    Pulse: 64 71    Resp: 16 16    Temp: 97.9 ??F (36.6 ??C)     SpO2: 97% 90% 96%   Weight: 96.6 kg (213 lb)               Physical Exam   Constitutional: She is oriented to person, place, and time. She appears well-developed and well-nourished. She appears distressed.   HENT:   Head: Normocephalic and atraumatic.   Mouth/Throat: Oropharynx is clear and moist.   Eyes: Conjunctivae and EOM are normal. Pupils are equal, round, and reactive to light. Right eye exhibits no discharge. Left eye exhibits no discharge. No scleral icterus.   Neck: Normal range of motion. Neck supple. No thyromegaly present.   Cardiovascular: Normal rate, regular rhythm, normal heart sounds and intact distal pulses.  Exam reveals no gallop and no friction rub.    No murmur heard.  Pulmonary/Chest: Effort normal and breath sounds normal. No respiratory distress. She has no wheezes. She exhibits no tenderness.   Abdominal: Soft. Normal appearance. She exhibits no distension, no ascites and no mass. Bowel sounds are decreased. There is no hepatosplenomegaly. There is generalized tenderness. There is no rigidity, no rebound and no guarding. No hernia.   Musculoskeletal: Normal range of motion. She exhibits no edema.        Right shoulder: She exhibits tenderness and pain. She exhibits no swelling, no effusion, no deformity and no spasm.        Left shoulder: She exhibits tenderness. She exhibits no swelling and no effusion.   Neurological: She is alert and oriented to person, place, and time. No cranial nerve deficit. She exhibits normal muscle tone.   Skin: Skin is warm. No rash noted. She is not diaphoretic. No erythema.   Psychiatric: Her speech is normal and behavior is normal. Judgment and thought content normal. Her mood appears anxious. Cognition and memory are normal.   Nursing note and vitals reviewed.       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    No other acute findings on today's workup.  Patient will be discharged with pain medications and symptomatic treatment.  Encouraged follow-up with her primary care doctor as soon as possible.       Amount and/or Complexity of Data Reviewed  Clinical lab tests: ordered and reviewed  Tests in the radiology section of CPT??: ordered and reviewed  Tests in the medicine section of CPT??: ordered and reviewed  Obtain history from someone other than the patient: yes  Review and summarize past medical records: yes  Independent visualization of images, tracings, or specimens: yes    Risk of Complications, Morbidity, and/or Mortality  Presenting problems: high  Diagnostic procedures: moderate  Management options: moderate  General comments: Elements of this note have been dictated via voice recognition software.  Text and phrases may be limited by the accuracy of the software.  The chart has been reviewed, but errors may still be present.      Patient Progress  Patient progress: improved    ED Course       Procedures

## 2016-01-08 NOTE — ED Triage Notes (Signed)
Pt reports epigastric pain x 3 days and also right shoulder pain. Pt denies chest pain or shortness of breath. Pt denies n/v/d or constipation.    Alene MiresAmanda M Travares Nelles, RN

## 2016-01-24 ENCOUNTER — Encounter: Attending: Cardiovascular Disease | Primary: Family Medicine

## 2016-01-30 ENCOUNTER — Ambulatory Visit
Admit: 2016-01-30 | Discharge: 2016-01-30 | Payer: PRIVATE HEALTH INSURANCE | Attending: Cardiovascular Disease | Primary: Family Medicine

## 2016-01-30 DIAGNOSIS — I10 Essential (primary) hypertension: Secondary | ICD-10-CM

## 2016-01-30 MED ORDER — LISINOPRIL-HYDROCHLOROTHIAZIDE 20 MG-12.5 MG TAB
ORAL_TABLET | Freq: Every day | ORAL | 3 refills | Status: DC
Start: 2016-01-30 — End: 2017-07-06

## 2016-01-30 NOTE — Progress Notes (Signed)
[address], [address]  [place], [place] [address]  PHONE: [phone]    Dinita Migliaccio  May 28, 1941      SUBJECTIVE:   Sarah Chavez is a 75 y.o. female seen for a follow up visit regarding the following:     Chief Complaint   Patient presents with   ??? Coronary Artery Disease     4 months and med review   ??? Hypertension       HPI:    HPI Comments: 75 year old female returns for follow-up of hypertension.  She has had a prior cath with only mild disease.  She is getting along fairly well.  She has had little difficulty losing weight.  She denies any chest pain or current shortness of breath or swelling      Past Medical History, Past Surgical History, Family history, Social History, and Medications were all reviewed with the patient today and updated as necessary.     Outpatient Prescriptions Marked as Taking for the 01/30/16 encounter (Office Visit) with Maree Krabbe, MD   Medication Sig Dispense Refill   ??? meloxicam (MOBIC) 7.5 mg tablet Take 7.5 mg by mouth two (2) times a day.     ??? Cholecalciferol, Vitamin D3, 50,000 unit cap Take  by mouth every seven (7) days.     ??? lisinopril-hydroCHLOROthiazide (PRINZIDE, ZESTORETIC) 20-12.5 mg per tablet Take 1 Tab by mouth daily. 90 Tab 3   ??? traMADol (ULTRAM) 50 mg tablet Take 2 Tabs by mouth every six (6) hours as needed for Pain. Max Daily Amount: 400 mg. 19 Tab 0   ??? famotidine (PEPCID) 20 mg tablet Take 1 Tab by mouth daily for 31 days. 31 Tab 0   ??? glipiZIDE SR (GLUCOTROL XL) 2.5 mg CR tablet Take 2.5 mg by mouth Daily (before breakfast).     ??? nitroglycerin (NITROSTAT) 0.4 mg SL tablet 0.4 mg by SubLINGual route every five (5) minutes as needed for Chest Pain (up to 3 doses).     ??? pravastatin (PRAVACHOL) 40 mg tablet Take 40 mg by mouth daily.     ??? aspirin 81 mg chewable tablet Take 1 Tab by mouth daily. 30 Tab 11     Allergies   Allergen Reactions   ??? Ativan [Lorazepam] Other (comments)     Made her feel crazy and chest pressure    ??? Ativan [Lorazepam] Other (comments)     Chest fullness   ??? Other Medication Palpitations     Some type of decongestant   ??? Other Medication Other (comments)     Doesn't take decongestants, but can't recall why     Past Medical History:   Diagnosis Date   ??? Abdominal pain 07/22/2015   ??? Anemia    ??? Back pain    ??? Bursitis of shoulder    ??? CAD (coronary artery disease)    ??? Cerumen impaction    ??? Chest pain, atypical 07/22/2015   ??? Diabetes (HCC)    ??? Diabetes mellitus out of control (HCC)    ??? Endocrine disease     thyroid   ??? Fatigue    ??? Hip pain, right    ??? Hyperlipidemia, mild    ??? Hypertension    ??? Hypertension associated with diabetes (HCC) 07/22/2015   ??? 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 Left carotid bruit    ??? Lumbago 07/22/2015   ??? Morbid obesity (HCC) 07/22/2015   ??? OSA on CPAP 07/22/2015   ???  Other ill-defined conditions     cholesterol   ??? Other ill-defined conditions     heart cath 96   ??? Shoulder pain, acute    ??? Shoulder pain, bilateral    ??? Sinusitis, acute maxillary    ??? Unstable angina (HCC) 07/06/2015   ??? Vitamin D deficiency    ??? Weakness      Past Surgical History:   Procedure Laterality Date   ??? HX HEENT      goiter/thyroid surgery   ??? HX THYROIDECTOMY       Family History   Problem Relation Age of Onset   ??? Stroke Sister    ??? Cancer Sister      cervical   ??? Stroke Brother    ??? Diabetes Other    ??? Hypertension Other       Social History   Substance Use Topics   ??? Smoking status: Former Smoker     Quit date: 1971   ??? Smokeless tobacco: Not on file      Comment: quit in 1971   ??? Alcohol use No       ROS:    Review of Systems   Constitution: Negative for weakness, malaise/fatigue and weight gain.   Eyes: Negative for blurred vision.   Cardiovascular: Negative for chest pain, dyspnea on exertion, irregular heartbeat, leg swelling, paroxysmal nocturnal dyspnea and syncope.   Respiratory: Negative for shortness of breath and wheezing.     Hematologic/Lymphatic: Negative for bleeding problem.   Musculoskeletal: Negative for joint pain and muscle weakness.   Gastrointestinal: Negative for abdominal pain and melena.   Genitourinary: Negative for hematuria.   Neurological: Negative for dizziness and seizures.   Psychiatric/Behavioral: Negative for depression.           PHYSICAL EXAM:    Visit Vitals   ??? BP 158/72 (BP 1 Location: Left arm, BP Patient Position: Sitting)   ??? Pulse 60   ??? Ht 5\' 2"  (1.575 m)   ??? Wt 211 lb 6.4 oz (95.9 kg)   ??? BMI 38.67 kg/m2          Wt Readings from Last 3 Encounters:   01/30/16 211 lb 6.4 oz (95.9 kg)   01/08/16 213 lb (96.6 kg)   11/20/15 210 lb (95.3 kg)     BP Readings from Last 3 Encounters:   01/30/16 158/72   01/08/16 149/57   11/20/15 140/86         Physical Exam   Constitutional: She appears well-developed. No distress.   HENT:   Head: Normocephalic.   Neck: Normal range of motion. Neck supple.   Cardiovascular: Regular rhythm, normal heart sounds and intact distal pulses.  Exam reveals no gallop and no friction rub.    No murmur heard.  Pulmonary/Chest: Effort normal and breath sounds normal. No respiratory distress. She has no rales.   Abdominal: Soft. She exhibits no distension. There is no tenderness.   obese   Musculoskeletal: She exhibits no edema or tenderness.   Neurological: She is alert.   Skin: Skin is warm and dry.       Medical problems and test results were reviewed with the patient today.     No results found for any visits on 01/30/16.  Lab Results   Component Value Date/Time    Sodium 144 01/08/2016 12:20 PM    Potassium 4.7 01/08/2016 12:20 PM    Chloride 106 01/08/2016 12:20 PM    CO2 31 01/08/2016 12:20 PM  Anion gap 7 01/08/2016 12:20 PM    Glucose 103 01/08/2016 12:20 PM    BUN 15 01/08/2016 12:20 PM    Creatinine 0.90 01/08/2016 12:20 PM    GFR est AA >60 01/08/2016 12:20 PM    GFR est non-AA >60 01/08/2016 12:20 PM    Calcium 8.8 01/08/2016 12:20 PM     Lab Results    Component Value Date/Time    Cholesterol, total 146 07/07/2015 05:15 AM    HDL Cholesterol 59 07/07/2015 05:15 AM    LDL, calculated 78.4 07/07/2015 05:15 AM    VLDL, calculated 8.6 07/07/2015 05:15 AM    Triglyceride 43 07/07/2015 05:15 AM    CHOL/HDL Ratio 2.5 07/07/2015 05:15 AM         ASSESSMENT and PLAN    Dreya was seen today for coronary artery disease and hypertension.    Diagnoses and all orders for this visit:    Essential hypertension blood pressure appears to be overall fairly stable she will stay on lisinopril and she ran out of that one.    Coronary artery disease involving native coronary artery of native heart without angina pectoris clinically stable at this point she is asymptomatic.  We need to get aggressive with the medical treatment    Dyslipidemia overall is improved.  Will check a lipid profile this year she will continue her medication  -     LIPID PANEL; Future    Controlled type 2 diabetes mellitus without complication, without long-term current use of insulin (HCC)    Morbid obesity due to excess calories (HCC) encouraged to continue to try to lose weight    Other orders  -     lisinopril-hydroCHLOROthiazide (PRINZIDE, ZESTORETIC) 20-12.5 mg per tablet; Take 1 Tab by mouth daily.               Follow-up Disposition:  Return in about 6 months (around 08/01/2016).    Maree Krabbe, MD  01/30/2016  10:44 AM

## 2016-02-19 ENCOUNTER — Ambulatory Visit
Admit: 2016-02-19 | Discharge: 2016-02-19 | Payer: PRIVATE HEALTH INSURANCE | Attending: Family Medicine | Primary: Family Medicine

## 2016-02-19 DIAGNOSIS — G5603 Carpal tunnel syndrome, bilateral upper limbs: Secondary | ICD-10-CM

## 2016-02-19 NOTE — Progress Notes (Signed)
SUBJECTIVE:   Sarah Chavez is a 75 y.o. female presents today as a new patient.  Patient has a very complicated past medical history.  She has multiple issues today.  Firstly she reports that for 3-4 months she has been experiencing numbness and tingling in her first and second fingers of both hands.  She reports that sometimes the numbness and tingling will extend upward into her forearms.  She reports also some chronic neck pain.  She reports no trauma.  She reports also a chronic history of arthritis and joint pains.  She has been treated with anti-inflammatories in the past.  She does not recall any workup being done for this.    PMH.  Vitamin D deficiency, hypertension, CAD, diabetes, sleep apnea, high cholesterol, DJD and goiter.    PSH.  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    Current medicines.  Listed in the EMR and reviewed today.    Social.  Denies tobacco or alcohol use.  She is single.  Has 4 children.    Family history.  Patient is a poor historian.  However it sounds like she has had one sister with uterine cancer.  HPI  See above    Past Medical History, Past Surgical History, Family history, Social History, and Medications were all reviewed with the patient today and updated as necessary.       Current Outpatient Prescriptions   Medication Sig Dispense Refill   ??? meloxicam (MOBIC) 7.5 mg tablet Take 7.5 mg by mouth two (2) times a day.     ??? Cholecalciferol, Vitamin D3, 50,000 unit cap Take  by mouth every seven (7) days.     ??? lisinopril-hydroCHLOROthiazide (PRINZIDE, ZESTORETIC) 20-12.5 mg per tablet Take 1 Tab by mouth daily. 90 Tab 3   ??? traMADol (ULTRAM) 50 mg tablet Take 2 Tabs by mouth every six (6) hours as needed for Pain. Max Daily Amount: 400 mg. 19 Tab 0   ??? glipiZIDE SR (GLUCOTROL XL) 2.5 mg CR tablet Take 2.5 mg by mouth Daily (before breakfast).     ??? nitroglycerin (NITROSTAT) 0.4 mg SL tablet 0.4 mg by SubLINGual route  every five (5) minutes as needed for Chest Pain (up to 3 doses).     ??? pravastatin (PRAVACHOL) 40 mg tablet Take 40 mg by mouth daily.     ??? aspirin 81 mg chewable tablet Take 1 Tab by mouth daily. 30 Tab 11     Allergies   Allergen Reactions   ??? Ativan [Lorazepam] Other (comments)     Made her feel crazy and chest pressure   ??? Ativan [Lorazepam] Other (comments)     Chest fullness   ??? Other Medication Palpitations     Some type of decongestant   ??? Other Medication Other (comments)     Doesn't take decongestants, but can't recall why     Patient Active Problem List   Diagnosis Code   ??? HTN (hypertension) I10   ??? Diabetes mellitus type 2, controlled (HCC) E11.9   ??? Hip pain M25.559   ??? Unstable angina (HCC) I20.0   ??? Coronary artery disease involving native coronary artery of native heart without angina pectoris I25.10   ??? Dyslipidemia E78.5   ??? Morbid obesity (HCC) E66.01   ??? OSA on CPAP G47.33, Z99.89     Past Medical History:   Diagnosis Date   ??? Abdominal pain 07/22/2015   ??? Anemia    ??? Back pain    ??? Bursitis of shoulder    ???  CAD (coronary artery disease)    ??? Cerumen impaction    ??? Chest pain, atypical 07/22/2015   ??? Diabetes (HCC)    ??? Diabetes mellitus out of control (HCC)    ??? Endocrine disease     thyroid   ??? Fatigue    ??? Hip pain, right    ??? Hyperlipidemia, mild    ??? Hypertension    ??? Hypertension associated with diabetes (HCC) 07/22/2015   ??? 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 Left carotid bruit    ??? Lumbago 07/22/2015   ??? Morbid obesity (HCC) 07/22/2015   ??? OSA on CPAP 07/22/2015   ??? Other ill-defined conditions     cholesterol   ??? Other ill-defined conditions     heart cath 96   ??? Shoulder pain, acute    ??? Shoulder pain, bilateral    ??? Sinusitis, acute maxillary    ??? Unstable angina (HCC) 07/06/2015   ??? Vitamin D deficiency    ??? Weakness      Past Surgical History:   Procedure Laterality Date   ??? HX HEENT      goiter/thyroid surgery   ??? HX THYROIDECTOMY       Family History    Problem Relation Age of Onset   ??? Stroke Sister    ??? Cancer Sister      cervical   ??? Stroke Brother    ??? Diabetes Other    ??? Hypertension Other      Social History   Substance Use Topics   ??? Smoking status: Former Smoker     Quit date: 1971   ??? Smokeless tobacco: Not on file      Comment: quit in 1971   ??? Alcohol use No         Review of Systems  See above    OBJECTIVE:  Visit Vitals   ??? BP 134/80   ??? Ht  (1.575 m)   ??? Wt 210 lb (95.3 kg)   ??? BMI 38.41 kg/m2        Physical Exam   Constitutional: She is oriented to person, place, and time. She appears well-developed and well-nourished.   HENT:   Head: Normocephalic and atraumatic.   Eyes: EOM are normal. Pupils are equal, round, and reactive to light.   Neck: Normal range of motion. Neck supple. No JVD present. No thyromegaly present.   Cardiovascular: Normal rate, regular rhythm and normal heart sounds.  Exam reveals no gallop and no friction rub.    No murmur heard.  Pulmonary/Chest: Effort normal. No respiratory distress. She has no wheezes. She has no rales.   Abdominal: Soft. There is no tenderness. There is no rebound and no guarding.   Musculoskeletal: Normal range of motion. She exhibits no edema.   Patient has degenerative changes noted in the PIP and DIP joints of her hands bilaterally.  There is a positive Tinel's test bilaterally more prominent on the left than the right.   Neurological: She is alert and oriented to person, place, and time.   Skin: No rash noted.   Psychiatric: She has a normal mood and affect.       Medical problems and test results were reviewed with the patient today.         ASSESSMENT and PLAN    1.  Bilateral CTS.  Discussed with the patient the value of trying a cock-up splint.  Refer to hand surgeon for further evaluation.  2.  Polyarthralgias.  Checking arthritis panel.  3.  Neck pain.  Possible radicular complaints.  Checking MRI.  4.  Diabetes.  Check hemoglobin A1c.  5.  High cholesterol.  Check lipid panel.   6.  Hypertension.  Check metabolic panel.  7.  History of goiter.  Check TSH.

## 2016-02-19 NOTE — Addendum Note (Signed)
Addended by: Shary KeyADAMS, Mrytle Bento L on: 02/19/2016 11:58 AM      Modules accepted: Orders

## 2016-02-20 LAB — METABOLIC PANEL, COMPREHENSIVE
A-G Ratio: 1.5 (ref 1.2–2.2)
ALT (SGPT): 21 IU/L (ref 0–32)
AST (SGOT): 21 IU/L (ref 0–40)
Albumin: 4.4 g/dL (ref 3.5–4.8)
Alk. phosphatase: 65 IU/L (ref 39–117)
BUN/Creatinine ratio: 23 (ref 12–28)
BUN: 21 mg/dL (ref 8–27)
Bilirubin, total: 0.9 mg/dL (ref 0.0–1.2)
CO2: 23 mmol/L (ref 18–29)
Calcium: 9.3 mg/dL (ref 8.7–10.3)
Chloride: 98 mmol/L (ref 96–106)
Creatinine: 0.93 mg/dL (ref 0.57–1.00)
GFR est AA: 69 mL/min/{1.73_m2} (ref >59)
GFR est non-AA: 59 mL/min/{1.73_m2} — ABNORMAL LOW (ref >59)
GLOBULIN, TOTAL: 2.9 g/dL (ref 1.5–4.5)
Glucose: 104 mg/dL — ABNORMAL HIGH (ref 65–99)
Potassium: 4 mmol/L (ref 3.5–5.2)
Protein, total: 7.3 g/dL (ref 6.0–8.5)
Sodium: 141 mmol/L (ref 134–144)

## 2016-02-20 LAB — LIPID PANEL
Cholesterol, total: 170 mg/dL (ref 100–199)
HDL Cholesterol: 71 mg/dL (ref >39)
LDL, calculated: 86 mg/dL (ref 0–99)
Triglyceride: 67 mg/dL (ref 0–149)
VLDL, calculated: 13 mg/dL (ref 5–40)

## 2016-02-20 LAB — HEMOGLOBIN A1C WITH EAG
Estimated average glucose: 120 mg/dL
Hemoglobin A1c: 5.8 % — ABNORMAL HIGH (ref 4.8–5.6)

## 2016-02-20 LAB — TSH 3RD GENERATION: TSH: 1.01 u[IU]/mL (ref 0.450–4.500)

## 2016-02-21 LAB — CYCLIC CITRUL PEPTIDE AB, IGG: CCP Antibodies IgG/IgA: 12 units (ref 0–19)

## 2016-02-21 LAB — ANA, DIRECT, W/REFLEX
ANA, DIRECT: NEGATIVE
Antinuclear Antibodies Direct: NEGATIVE

## 2016-02-21 LAB — SED RATE (ESR): Sed rate (ESR): 12 mm/hr (ref 0–40)

## 2016-02-21 LAB — URIC ACID: Uric acid: 7.2 mg/dL — ABNORMAL HIGH (ref 2.5–7.1)

## 2016-02-21 LAB — RHEUMATOID FACTOR, QL: Rheumatoid factor: <10 IU/mL (ref 0.0–13.9)

## 2016-04-01 ENCOUNTER — Ambulatory Visit
Admit: 2016-04-01 | Discharge: 2016-04-01 | Payer: PRIVATE HEALTH INSURANCE | Attending: Family Medicine | Primary: Family Medicine

## 2016-04-01 DIAGNOSIS — M542 Cervicalgia: Secondary | ICD-10-CM

## 2016-04-01 NOTE — Progress Notes (Signed)
SUBJECTIVE:   Sarah Chavez is a 75 y.o. female who presented a previous visit as a new patient requesting access to healthcare.  Patient has a history of diabetes, hypertension, high cholesterol and goiter.  She was complaining of persistent joint pain that was migratory and polyarticular.  She was having some neck discomfort with upper extremity radicular complaints as well as symptoms consistent with carpal tunnel.  Blood work was ordered including an arthritis panel.  An MRI of her neck was ordered and she was referred to orthopedics for consideration of a nerve conduction study.  I encouraged her to take her anti-inflammatories which included Mobic.  Also encouraged her to use cockup splints.  She reports her joint pain is better.  Her neck pain is significantly better.  She is seen the orthopedist and had nerve conduction studies.  Patient decided not to go get the MRI of her neck.  She denies chest pain, shortness of breath, orthopnea or PND.  GI and GU review of systems is unremarkable.    HPI  See above    Past Medical History, Past Surgical History, Family history, Social History, and Medications were all reviewed with the patient today and updated as necessary.       Current Outpatient Prescriptions   Medication Sig Dispense Refill   ??? meloxicam (MOBIC) 7.5 mg tablet Take 7.5 mg by mouth two (2) times a day.     ??? Cholecalciferol, Vitamin D3, 50,000 unit cap Take  by mouth every seven (7) days.     ??? lisinopril-hydroCHLOROthiazide (PRINZIDE, ZESTORETIC) 20-12.5 mg per tablet Take 1 Tab by mouth daily. 90 Tab 3   ??? traMADol (ULTRAM) 50 mg tablet Take 2 Tabs by mouth every six (6) hours as needed for Pain. Max Daily Amount: 400 mg. 19 Tab 0   ??? glipiZIDE SR (GLUCOTROL XL) 2.5 mg CR tablet Take 2.5 mg by mouth Daily (before breakfast).     ??? nitroglycerin (NITROSTAT) 0.4 mg SL tablet 0.4 mg by SubLINGual route every five (5) minutes as needed for Chest Pain (up to 3 doses).      ??? pravastatin (PRAVACHOL) 40 mg tablet Take 40 mg by mouth daily.     ??? aspirin 81 mg chewable tablet Take 1 Tab by mouth daily. 30 Tab 11     Allergies   Allergen Reactions   ??? Ativan [Lorazepam] Other (comments)     Made her feel crazy and chest pressure   ??? Ativan [Lorazepam] Other (comments)     Chest fullness   ??? Other Medication Palpitations     Some type of decongestant   ??? Other Medication Other (comments)     Doesn't take decongestants, but can't recall why     Patient Active Problem List   Diagnosis Code   ??? HTN (hypertension) I10   ??? Diabetes mellitus type 2, controlled (HCC) E11.9   ??? Hip pain M25.559   ??? Unstable angina (HCC) I20.0   ??? Coronary artery disease involving native coronary artery of native heart without angina pectoris I25.10   ??? Dyslipidemia E78.5   ??? Morbid obesity (HCC) E66.01   ??? OSA on CPAP G47.33, Z99.89     Past Medical History:   Diagnosis Date   ??? Abdominal pain 07/22/2015   ??? Anemia    ??? Back pain    ??? Bursitis of shoulder    ??? CAD (coronary artery disease)    ??? Cerumen impaction    ??? Chest pain, atypical 07/22/2015   ???  Diabetes (HCC)    ??? Diabetes mellitus out of control (HCC)    ??? Endocrine disease     thyroid   ??? Fatigue    ??? Hip pain, right    ??? Hyperlipidemia, mild    ??? Hypertension    ??? Hypertension associated with diabetes (HCC) 07/22/2015   ??? 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 Left carotid bruit    ??? Lumbago 07/22/2015   ??? Morbid obesity (HCC) 07/22/2015   ??? OSA on CPAP 07/22/2015   ??? Other ill-defined conditions     cholesterol   ??? Other ill-defined conditions     heart cath 96   ??? Shoulder pain, acute    ??? Shoulder pain, bilateral    ??? Sinusitis, acute maxillary    ??? Unstable angina (HCC) 07/06/2015   ??? Vitamin D deficiency    ??? Weakness      Past Surgical History:   Procedure Laterality Date   ??? HX HEENT      goiter/thyroid surgery   ??? HX THYROIDECTOMY       Family History   Problem Relation Age of Onset   ??? Stroke Sister    ??? Cancer Sister       cervical   ??? Stroke Brother    ??? Diabetes Other    ??? Hypertension Other      Social History   Substance Use Topics   ??? Smoking status: Former Smoker     Quit date: 1971   ??? Smokeless tobacco: Never Used      Comment: quit in 1971   ??? Alcohol use No         Review of Systems  See above    OBJECTIVE:  Visit Vitals   ??? BP 128/82 (BP 1 Location: Left arm, BP Patient Position: Sitting)   ??? Ht 5\' 2"  (1.575 m)   ??? Wt 210 lb (95.3 kg)   ??? BMI 38.41 kg/m2        Physical Exam   Constitutional: She is oriented to person, place, and time. She appears well-developed and well-nourished.   HENT:   Head: Normocephalic and atraumatic.   Eyes: EOM are normal. Pupils are equal, round, and reactive to light.   Neck: Normal range of motion. Neck supple. No JVD present. No thyromegaly present.   Cardiovascular: Normal rate, regular rhythm and normal heart sounds.  Exam reveals no gallop and no friction rub.    No murmur heard.  Pulmonary/Chest: Effort normal. No respiratory distress. She has no wheezes. She has no rales.   Abdominal: Soft. There is no tenderness. There is no rebound and no guarding.   Musculoskeletal: Normal range of motion. She exhibits no edema.   No gross deformity or swelling of joint is noted.   Neurological: She is alert and oriented to person, place, and time.   Skin: No rash noted.   Psychiatric: She has a normal mood and affect.       Medical problems and test results were reviewed with the patient today.         ASSESSMENT and PLAN    1.  Neck pain.  MRI was not done.  Patient feels better.  She prefers not to pursue this.  2.  Bilateral carpal tunnel.  Continue follow-up with hand surgeon.  Encouraged continue use of cock-up splint.  3.  Polyarthralgias.  Arthritis panel negative with exception of slight increase in uric acid level at 7.2.  Sed rate  was 12.  Continue current anti-inflammatories.  Renal function looks stable.  4.  Diabetes.  A1c is 5.8.  I do not have an old A1c to compare to.  She  is on glipizide XL 2.5 mg a day.  I believe it may be reasonable at next visit to consider stopping this and seen her she can dietarily manage her diabetes so long as her A1c remains excellent.  Encouraged yearly retinal exams and daily feet exams.  5.  Hypertension.  Blood pressure is well controlled.  Metabolic panel unremarkable.  6.  High cholesterol.  LDL is 86.  Patient reports she is inconsistently taking her statin.  Strongly encouraged consistency.  7.  History of goiter.  I will try to see when her last thyroid ultrasound was.  Patient's TSH is 1.0.

## 2016-05-05 MED ORDER — FAMOTIDINE 20 MG TAB
20 mg | ORAL_TABLET | Freq: Every day | ORAL | 5 refills | Status: DC
Start: 2016-05-05 — End: 2017-01-25

## 2016-05-05 MED ORDER — PRAVASTATIN 40 MG TAB
40 mg | ORAL_TABLET | Freq: Every day | ORAL | 5 refills | Status: DC
Start: 2016-05-05 — End: 2017-07-06

## 2016-05-05 NOTE — Telephone Encounter (Signed)
Please send refills on acid reflux and cholestorol meds to [facility] on [address]

## 2016-08-03 ENCOUNTER — Encounter: Attending: Cardiovascular Disease | Primary: Family Medicine

## 2016-09-21 ENCOUNTER — Ambulatory Visit
Admit: 2016-09-21 | Discharge: 2016-09-21 | Payer: PRIVATE HEALTH INSURANCE | Attending: Family Medicine | Primary: Family Medicine

## 2016-09-21 DIAGNOSIS — Z0000 Encounter for general adult medical examination without abnormal findings: Secondary | ICD-10-CM

## 2016-09-21 LAB — AMB POC URINALYSIS DIP STICK AUTO W/ MICRO
Bilirubin (UA POC): NEGATIVE
Blood (UA POC): NEGATIVE
Glucose (UA POC): NEGATIVE
Ketones (UA POC): NEGATIVE
Leukocyte esterase (UA POC): NEGATIVE
Nitrites (UA POC): NEGATIVE
Protein (UA POC): NEGATIVE
Specific gravity (UA POC): 1.015 (ref 1.001–1.035)
Urobilinogen (UA POC): 0.2 (ref 0.2–1)
pH (UA POC): 6 (ref 4.6–8.0)

## 2016-09-21 LAB — AMB POC COMPLETE CBC,AUTOMATED ENTER
ABS. GRANS (POC): 2.3 10*3/uL (ref 2.0–7.8)
ABS. LYMPHS (POC): 3.4 10*3/uL (ref 0.6–4.1)
GRANULOCYTES (POC): 35.9 % — AB (ref 37.0–92.0)
HCT (POC): 46.9 % (ref 37.0–51.0)
HGB (POC): 15.2 g/dL (ref 12.0–18.0)
LYMPHOCYTES (POC): 54.6 % (ref 10.0–58.5)
MCH (POC): 28.9 pg (ref 26.0–32.0)
MCHC (POC): 32.4 g/dL (ref 31.0–36.0)
MCV (POC): 89.2 fL (ref 80.0–97.0)
MID% POC: 9.5 % (ref 0.1–24.0)
MPV (POC): 8.6 fL (ref 0.0–49.9)
Mid # (POC): 0.6 10*3/uL (ref 0.0–1.8)
PLATELET (POC): 173 10*3/uL (ref 140–440)
RBC (POC): 5.26 10*6/uL (ref 4.20–6.30)
RDW (POC): 11.8 % (ref 11.5–14.5)
WBC (POC): 6.3 10*3/uL (ref 4.1–10.9)

## 2016-09-21 LAB — AMB POC URINE, MICROALBUMIN, SEMIQUANTITATIVE: Microalbumin urine (POC): 100 MG/L

## 2016-09-21 NOTE — Patient Instructions (Signed)
Medicare Wellness Visit, Female    The best way to live healthy is to have a healthy lifestyle by eating a well-balanced diet, exercising regularly, limiting alcohol and stopping smoking.    Regular physical exams and screening tests are another way to keep healthy. Preventive exams provided by your health care provider can find health problems before they become diseases or illnesses. Preventive services including immunizations, screening tests, monitoring and exams can help you take care of your own health.    All people over age 78 should have a pneumovax  and and a prevnar shot to prevent pneumonia. These are once in a lifetime unless you and your provider decide differently.    All people over 65 should have a yearly flu shot and a tetanus vaccine every 10 years.    A bone mass density to screen for osteoporosis or thinning of the bones should be done every 2 years after 65.    Screening for diabetes mellitus with a blood sugar test should be done every year.    Glaucoma is a disease of the eye due to increased ocular pressure that can lead to blindness and it should be done every year by an eye professional.    Cardiovascular screening tests that check for elevated lipids (fatty part of blood) which can lead to heart disease and strokes should be done every 5 years.    Colorectal screening that evaluates for blood or polyps in your colon should be done yearly as a stool test or every five years as a flexible sigmoidoscope or every 10 years as a colonoscopy up to age 78.    Breast cancer screening with a mammogram is recommended biennially  for women age 78-74.    Screening for cervical cancer with a pap smear and pelvic exam is recommended for women after age 78 years every 2 years up to age 78 or when the provider and patient decide to stop.If there is a history of cervical abnormalities or other increased risk for cancer then the test is recommended yearly.     Hepatitis C screening is also recommended for anyone born between [birth year]1945 through 1965.    A shingles vaccine is also recommended once in a lifetime after age 78.    Your Medicare Wellness Exam is recommended annually.    Here is a list of your current Health Maintenance items with a due date:  Health Maintenance Due   Topic Date Due   ??? Diabetic Foot Care  12/04/1948   ??? Albumin Urine Test  12/04/1948   ??? DTaP/Tdap/Td  (1 - Tdap) 12/05/1959   ??? Shingles Vaccine  10/06/1998   ??? Annual Well Visit  02/14/2015   ??? Flu Vaccine  04/07/2016   ??? Eye Exam  04/09/2016   ??? Hemoglobin A1C    08/20/2016

## 2016-09-21 NOTE — Progress Notes (Signed)
This is a Subsequent Medicare Annual Wellness Exam (AWV) (Performed 12 months after IPPE or effective date of Medicare Part B enrollment)    I have reviewed the patient's medical history in detail and updated the computerized patient record.     History     Past Medical History:   Diagnosis Date   ??? Abdominal pain 07/22/2015   ??? Anemia    ??? Back pain    ??? Bursitis of shoulder    ??? CAD (coronary artery disease)    ??? Cerumen impaction    ??? Chest pain, atypical 07/22/2015   ??? Diabetes (HCC)    ??? Diabetes mellitus out of control (HCC)    ??? Endocrine disease     thyroid   ??? Fatigue    ??? Hip pain, right    ??? Hyperlipidemia, mild    ??? Hypertension    ??? Hypertension associated with diabetes (HCC) 07/22/2015   ??? 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 Left carotid bruit    ??? Lumbago 07/22/2015   ??? Morbid obesity (HCC) 07/22/2015   ??? OSA on CPAP 07/22/2015   ??? Other ill-defined conditions     cholesterol   ??? Other ill-defined conditions     heart cath 96   ??? Shoulder pain, acute    ??? Shoulder pain, bilateral    ??? Sinusitis, acute maxillary    ??? Unstable angina (HCC) 07/06/2015   ??? Vitamin D deficiency    ??? Weakness       Past Surgical History:   Procedure Laterality Date   ??? HX HEENT      goiter/thyroid surgery   ??? HX THYROIDECTOMY       Current Outpatient Prescriptions   Medication Sig Dispense Refill   ??? CHOLECALCIFEROL, VITAMIN D3, (VITAMIN D3 PO) Take  by mouth.     ??? famotidine (PEPCID) 20 mg tablet Take 1 Tab by mouth daily. 30 Tab 5   ??? pravastatin (PRAVACHOL) 40 mg tablet Take 1 Tab by mouth daily. 30 Tab 5   ??? meloxicam (MOBIC) 7.5 mg tablet Take 7.5 mg by mouth two (2) times a day.     ??? lisinopril-hydroCHLOROthiazide (PRINZIDE, ZESTORETIC) 20-12.5 mg per tablet Take 1 Tab by mouth daily. 90 Tab 3   ??? traMADol (ULTRAM) 50 mg tablet Take 2 Tabs by mouth every six (6) hours as needed for Pain. Max Daily Amount: 400 mg. 19 Tab 0    ??? glipiZIDE SR (GLUCOTROL XL) 2.5 mg CR tablet Take 2.5 mg by mouth Daily (before breakfast).     ??? nitroglycerin (NITROSTAT) 0.4 mg SL tablet 0.4 mg by SubLINGual route every five (5) minutes as needed for Chest Pain (up to 3 doses).     ??? aspirin 81 mg chewable tablet Take 1 Tab by mouth daily. 30 Tab 11     Allergies   Allergen Reactions   ??? Ativan [Lorazepam] Other (comments)     Made her feel crazy and chest pressure   ??? Ativan [Lorazepam] Other (comments)     Chest fullness   ??? Other Medication Palpitations     Some type of decongestant   ??? Other Medication Other (comments)     Doesn't take decongestants, but can't recall why     Family History   Problem Relation Age of Onset   ??? Stroke Sister    ??? Cancer Sister      cervical   ??? Stroke Brother    ??? Diabetes  Other    ??? Hypertension Other      Social History   Substance Use Topics   ??? Smoking status: Former Smoker     Quit date: 1971   ??? Smokeless tobacco: Never Used      Comment: quit in 1971   ??? Alcohol use No     Patient Active Problem List   Diagnosis Code   ??? HTN (hypertension) I10   ??? Diabetes mellitus type 2, controlled (HCC) E11.9   ??? Hip pain M25.559   ??? Unstable angina (HCC) I20.0   ??? Coronary artery disease involving native coronary artery of native heart without angina pectoris I25.10   ??? Dyslipidemia E78.5   ??? Morbid obesity (HCC) E66.01   ??? OSA on CPAP G47.33, Z99.89       Depression Risk Factor Screening:     PHQ over the last two weeks 09/21/2016   Little interest or pleasure in doing things Not at all   Feeling down, depressed or hopeless Not at all   Total Score PHQ 2 0     Alcohol Risk Factor Screening:   You do not drink alcohol or very rarely.    Functional Ability and Level of Safety:   Hearing Loss  Hearing is good.    Activities of Daily Living  The home contains: no safety equipment.  Patient does total self care    Fall Risk  Fall Risk Assessment, last 12 mths 09/21/2016   Able to walk? Yes   Fall in past 12 months? No        Abuse Screen  Patient is not abused    Cognitive Screening   Evaluation of Cognitive Function:  Has your family/caregiver stated any concerns about your memory: no  Normal    Patient Care Team   Patient Care Team:  Delana Meyer, MD as PCP - General (Family Practice)  Maree Krabbe, MD (Cardiology)    Assessment/Plan   Education and counseling provided:  Are appropriate based on today's review and evaluation    1.  Subsequent Medicare wellness exam.  Anticipatory guidance discussed including the importance of sunscreen use, helmet use and seatbelt use.  No depressive symptoms are reported.  Risk for falls is moderate.  Fall precautions discussed.  Cognitive functioning appears to be intact.  Pneumovax 23 appears to be up-to-date.  Prevnar 13 is not.  This is offered and given today.  Patient reports flu vaccine is up-to-date.  Screening colonoscopy is never been done.  This is offered and the patient states that she will consider having it done and let me know at follow-up if she wants it done or not.    Health Maintenance Due   Topic Date Due   ??? FOOT EXAM Q1  12/04/1950   ??? MICROALBUMIN Q1  [birth year]/04/1950   ??? DTaP/Tdap/Td series (1 - Tdap) 12/05/1959   ??? ZOSTER VACCINE AGE 22>  10/06/1998   ??? MEDICARE YEARLY EXAM  02/14/2015   ??? Influenza Age 37 to Adult  04/07/2016   ??? EYE EXAM RETINAL OR DILATED Q1  04/09/2016   ??? HEMOGLOBIN A1C Q6M  08/20/2016

## 2016-09-21 NOTE — Progress Notes (Signed)
SUBJECTIVE:   Sarah Chavez is a 76 y.o. female here for subsequent Medicare wellness exam.  Patient is a past medical history significant for hypertension, CAD, diabetes, high cholesterol, goiter, sleep apnea, vitamin D deficiency and DJD.  Current medications listed in the EMR and reviewed today.  Patient reports no chest pain, shortness of breath, orthopnea or PND.  GI and GU review systems is unremarkable.    HPI  See above    Past Medical History, Past Surgical History, Family history, Social History, and Medications were all reviewed with the patient today and updated as necessary.       Current Outpatient Prescriptions   Medication Sig Dispense Refill   ??? CHOLECALCIFEROL, VITAMIN D3, (VITAMIN D3 PO) Take  by mouth.     ??? famotidine (PEPCID) 20 mg tablet Take 1 Tab by mouth daily. 30 Tab 5   ??? pravastatin (PRAVACHOL) 40 mg tablet Take 1 Tab by mouth daily. 30 Tab 5   ??? meloxicam (MOBIC) 7.5 mg tablet Take 7.5 mg by mouth two (2) times a day.     ??? lisinopril-hydroCHLOROthiazide (PRINZIDE, ZESTORETIC) 20-12.5 mg per tablet Take 1 Tab by mouth daily. 90 Tab 3   ??? traMADol (ULTRAM) 50 mg tablet Take 2 Tabs by mouth every six (6) hours as needed for Pain. Max Daily Amount: 400 mg. 19 Tab 0   ??? glipiZIDE SR (GLUCOTROL XL) 2.5 mg CR tablet Take 2.5 mg by mouth Daily (before breakfast).     ??? nitroglycerin (NITROSTAT) 0.4 mg SL tablet 0.4 mg by SubLINGual route every five (5) minutes as needed for Chest Pain (up to 3 doses).     ??? aspirin 81 mg chewable tablet Take 1 Tab by mouth daily. 30 Tab 11     Allergies   Allergen Reactions   ??? Ativan [Lorazepam] Other (comments)     Made her feel crazy and chest pressure   ??? Ativan [Lorazepam] Other (comments)     Chest fullness   ??? Other Medication Palpitations     Some type of decongestant   ??? Other Medication Other (comments)     Doesn't take decongestants, but can't recall why     Patient Active Problem List   Diagnosis Code   ??? HTN (hypertension) I10    ??? Diabetes mellitus type 2, controlled (HCC) E11.9   ??? Hip pain M25.559   ??? Unstable angina (HCC) I20.0   ??? Coronary artery disease involving native coronary artery of native heart without angina pectoris I25.10   ??? Dyslipidemia E78.5   ??? Morbid obesity (HCC) E66.01   ??? OSA on CPAP G47.33, Z99.89     Past Medical History:   Diagnosis Date   ??? Abdominal pain 07/22/2015   ??? Anemia    ??? Back pain    ??? Bursitis of shoulder    ??? CAD (coronary artery disease)    ??? Cerumen impaction    ??? Chest pain, atypical 07/22/2015   ??? Diabetes (HCC)    ??? Diabetes mellitus out of control (HCC)    ??? Endocrine disease     thyroid   ??? Fatigue    ??? Hip pain, right    ??? Hyperlipidemia, mild    ??? Hypertension    ??? Hypertension associated with diabetes (HCC) 07/22/2015   ??? 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 Left carotid bruit    ??? Lumbago 07/22/2015   ??? Morbid obesity (HCC) 07/22/2015   ??? OSA on CPAP 07/22/2015   ???  Other ill-defined conditions     cholesterol   ??? Other ill-defined conditions     heart cath 96   ??? Shoulder pain, acute    ??? Shoulder pain, bilateral    ??? Sinusitis, acute maxillary    ??? Unstable angina (HCC) 07/06/2015   ??? Vitamin D deficiency    ??? Weakness      Past Surgical History:   Procedure Laterality Date   ??? HX HEENT      goiter/thyroid surgery   ??? HX THYROIDECTOMY       Family History   Problem Relation Age of Onset   ??? Stroke Sister    ??? Cancer Sister      cervical   ??? Stroke Brother    ??? Diabetes Other    ??? Hypertension Other      Social History   Substance Use Topics   ??? Smoking status: Former Smoker     Quit date: 1971   ??? Smokeless tobacco: Never Used      Comment: quit in 1971   ??? Alcohol use No         Review of Systems  See above    OBJECTIVE:  Visit Vitals   ??? BP 122/70   ??? Ht 5\' 2"  (1.575 m)   ??? Wt 217 lb 6.4 oz (98.6 kg)   ??? BMI 39.76 kg/m2        Physical Exam   Constitutional: She is oriented to person, place, and time. She appears well-developed and well-nourished.   HENT:    Head: Normocephalic and atraumatic.   Right Ear: External ear normal.   Left Ear: External ear normal.   Nose: Nose normal.   Mouth/Throat: Oropharynx is clear and moist. No oropharyngeal exudate.   Eyes: EOM are normal. Pupils are equal, round, and reactive to light. Right eye exhibits no discharge. Left eye exhibits no discharge. No scleral icterus.   Neck: Normal range of motion. Neck supple. No JVD present. No tracheal deviation present. No thyromegaly present.   Cardiovascular: Normal rate, regular rhythm, normal heart sounds and intact distal pulses.  Exam reveals no gallop and no friction rub.    No murmur heard.  Pulmonary/Chest: Effort normal and breath sounds normal. No respiratory distress. She has no wheezes. She has no rales. She exhibits no tenderness.   Abdominal: Soft. Bowel sounds are normal. She exhibits no distension and no mass. There is no tenderness. There is no rebound and no guarding.   Musculoskeletal: Normal range of motion. She exhibits no edema or tenderness.   Lymphadenopathy:     She has no cervical adenopathy.   Neurological: She is alert and oriented to person, place, and time. She has normal reflexes. No cranial nerve deficit. She exhibits normal muscle tone. Coordination normal.   Skin: Skin is warm and dry. No rash noted. No erythema.   Psychiatric: She has a normal mood and affect. Her behavior is normal. Judgment and thought content normal.       Medical problems and test results were reviewed with the patient today.         ASSESSMENT and PLAN    1.  Hypertension.  Blood pressures well controlled.  Continue current therapy.  Check metabolic panel, CBC, UA and lipid panel.  2.  CAD.  No chest pain reported.  3.  Diabetes.  Patient reports ophthalmologic exams are up-to-date.  Encouraged daily feet exams.  Check hemoglobin A1c and urine microalbumin.  4.  High cholesterol.  Check lipid panel.  5.  History of goiter.  Checking TSH.   6.  Sleep apnea.  Patient reports compliance with CPAP.  7.  Vitamin D deficiency.  Checking vitamin D level.  8.  DJD.  No complaints of breakthrough symptoms.

## 2016-09-22 LAB — METABOLIC PANEL, COMPREHENSIVE
A-G Ratio: 1.5 (ref 1.2–2.2)
ALT (SGPT): 22 IU/L (ref 0–32)
AST (SGOT): 20 IU/L (ref 0–40)
Albumin: 4.5 g/dL (ref 3.5–4.8)
Alk. phosphatase: 64 IU/L (ref 39–117)
BUN/Creatinine ratio: 14 (ref 12–28)
BUN: 12 mg/dL (ref 8–27)
Bilirubin, total: 0.6 mg/dL (ref 0.0–1.2)
CO2: 25 mmol/L (ref 18–29)
Calcium: 9.7 mg/dL (ref 8.7–10.3)
Chloride: 103 mmol/L (ref 96–106)
Creatinine: 0.85 mg/dL (ref 0.57–1.00)
GFR est AA: 76 mL/min/{1.73_m2} (ref >59)
GFR est non-AA: 66 mL/min/{1.73_m2} (ref >59)
GLOBULIN, TOTAL: 3 g/dL (ref 1.5–4.5)
Glucose: 92 mg/dL (ref 65–99)
Potassium: 4.3 mmol/L (ref 3.5–5.2)
Protein, total: 7.5 g/dL (ref 6.0–8.5)
Sodium: 146 mmol/L — ABNORMAL HIGH (ref 134–144)

## 2016-09-22 LAB — LIPID PANEL
Cholesterol, total: 194 mg/dL (ref 100–199)
HDL Cholesterol: 63 mg/dL (ref >39)
LDL, calculated: 115 mg/dL — ABNORMAL HIGH (ref 0–99)
Triglyceride: 80 mg/dL (ref 0–149)
VLDL, calculated: 16 mg/dL (ref 5–40)

## 2016-09-22 LAB — HEMOGLOBIN A1C WITH EAG
Estimated average glucose: 126 mg/dL
Hemoglobin A1c: 6 % — ABNORMAL HIGH (ref 4.8–5.6)

## 2016-09-22 LAB — VITAMIN D, 25 HYDROXY: VITAMIN D, 25-HYDROXY: 19.5 ng/mL — ABNORMAL LOW (ref 30.0–100.0)

## 2016-09-22 LAB — TSH 3RD GENERATION: TSH: 0.845 u[IU]/mL (ref 0.450–4.500)

## 2016-11-02 ENCOUNTER — Ambulatory Visit
Admit: 2016-11-02 | Discharge: 2016-11-02 | Payer: PRIVATE HEALTH INSURANCE | Attending: Family Medicine | Primary: Family Medicine

## 2016-11-02 DIAGNOSIS — R079 Chest pain, unspecified: Secondary | ICD-10-CM

## 2016-11-02 NOTE — Telephone Encounter (Signed)
Refuse auto refill rx's, Refills must be requested by pharmacy, pt or through Mychart

## 2016-11-02 NOTE — Progress Notes (Signed)
SUBJECTIVE:   Sarah Chavez is a 76 y.o. female who has a past medical history significant for hypertension, CAD, diabetes, high cholesterol, goiter and vitamin D deficiency.  Current medications are listed in the EMR and reviewed today.  Patient reports that she has had intermittently for several weeks some "achiness" in her chest.  This achiness is very vague in his description.  It is random and not related to physical activity.  There is no associated shortness of breath, orthopnea or PND.  It does not appear to be GI related.  She reports at times that she will have also some spells that are unrelated to her chest discomfort where she feels slightly weak and shaky.  She does not have a glucometer to check her blood sugars.  She is on glipizide 2.5 mg 1 a day.  Other medications are listed in the EMR and reviewed today.    HPI  See above    Past Medical History, Past Surgical History, Family history, Social History, and Medications were all reviewed with the patient today and updated as necessary.       Current Outpatient Prescriptions   Medication Sig Dispense Refill   ??? CHOLECALCIFEROL, VITAMIN D3, (VITAMIN D3 PO) Take  by mouth.     ??? famotidine (PEPCID) 20 mg tablet Take 1 Tab by mouth daily. 30 Tab 5   ??? pravastatin (PRAVACHOL) 40 mg tablet Take 1 Tab by mouth daily. 30 Tab 5   ??? meloxicam (MOBIC) 7.5 mg tablet Take 7.5 mg by mouth two (2) times a day.     ??? lisinopril-hydroCHLOROthiazide (PRINZIDE, ZESTORETIC) 20-12.5 mg per tablet Take 1 Tab by mouth daily. 90 Tab 3   ??? traMADol (ULTRAM) 50 mg tablet Take 2 Tabs by mouth every six (6) hours as needed for Pain. Max Daily Amount: 400 mg. 19 Tab 0   ??? glipiZIDE SR (GLUCOTROL XL) 2.5 mg CR tablet Take 2.5 mg by mouth Daily (before breakfast).     ??? nitroglycerin (NITROSTAT) 0.4 mg SL tablet 0.4 mg by SubLINGual route every five (5) minutes as needed for Chest Pain (up to 3 doses).     ??? aspirin 81 mg chewable tablet Take 1 Tab by mouth daily. 30 Tab 11      Allergies   Allergen Reactions   ??? Ativan [Lorazepam] Other (comments)     Made her feel crazy and chest pressure   ??? Ativan [Lorazepam] Other (comments)     Chest fullness   ??? Other Medication Palpitations     Some type of decongestant   ??? Other Medication Other (comments)     Doesn't take decongestants, but can't recall why     Patient Active Problem List   Diagnosis Code   ??? HTN (hypertension) I10   ??? Diabetes mellitus type 2, controlled (HCC) E11.9   ??? Hip pain M25.559   ??? Unstable angina (HCC) I20.0   ??? Coronary artery disease involving native coronary artery of native heart without angina pectoris I25.10   ??? Dyslipidemia E78.5   ??? Morbid obesity (HCC) E66.01   ??? OSA on CPAP G47.33, Z99.89     Past Medical History:   Diagnosis Date   ??? Abdominal pain 07/22/2015   ??? Anemia    ??? Back pain    ??? Bursitis of shoulder    ??? CAD (coronary artery disease)    ??? Cerumen impaction    ??? Chest pain, atypical 07/22/2015   ??? Diabetes (HCC)    ???  Diabetes mellitus out of control [facility](HCC)    ??? Endocrine disease     thyroid   ??? Fatigue    ??? Hip pain, right    ??? Hyperlipidemia, mild    ??? Hypertension    ??? Hypertension associated with diabetes (HCC) 07/22/2015   ??? Hypertensive pulmonary vascular disease 07/22/2015   ??? Left carotid bruit    ??? Lumbago 07/22/2015   ??? Morbid obesity (HCC) 07/22/2015   ??? OSA on CPAP 07/22/2015   ??? Other ill-defined conditions(799.89)     cholesterol   ??? Other ill-defined conditions(799.89)     heart cath 96   ??? Shoulder pain, acute    ??? Shoulder pain, bilateral    ??? Sinusitis, acute maxillary    ??? Unstable angina (HCC) 07/06/2015   ??? Vitamin D deficiency    ??? Weakness      Past Surgical History:   Procedure Laterality Date   ??? HX HEENT      goiter/thyroid surgery   ??? HX THYROIDECTOMY       Family History   Problem Relation Age of Onset   ??? Stroke Sister    ??? Cancer Sister      cervical   ??? Stroke Brother    ??? Diabetes Other    ??? Hypertension Other      Social History   Substance Use Topics    ??? Smoking status: Former Smoker     Quit date: 1971   ??? Smokeless tobacco: Never Used      Comment: quit in 1971   ??? Alcohol use No         Review of Systems  See above    OBJECTIVE:  Visit Vitals   ??? BP 126/82   ??? Pulse (!) 58   ??? Ht 5\' 2"  (1.575 m)   ??? SpO2 98%        Physical Exam   Constitutional: She is oriented to person, place, and time. She appears well-developed and well-nourished.   HENT:   Head: Normocephalic and atraumatic.   Eyes: EOM are normal. Pupils are equal, round, and reactive to light.   Neck: Normal range of motion. Neck supple. No JVD present. No thyromegaly present.   Cardiovascular: Normal rate, regular rhythm and normal heart sounds.  Exam reveals no gallop and no friction rub.    No murmur heard.  Pulmonary/Chest: Effort normal. No respiratory distress. She has no wheezes. She has no rales.   Abdominal: Soft. There is no tenderness. There is no rebound and no guarding.   Musculoskeletal: Normal range of motion. She exhibits no edema.   Neurological: She is alert and oriented to person, place, and time.   Skin: No rash noted.   Psychiatric: She has a normal mood and affect.       Medical problems and test results were reviewed with the patient today.         ASSESSMENT and PLAN    1.  Atypical chest pain.  Twelve-lead EKG reveals no acute ischemic changes.  Pain is likely noncardiac.  However given the patient's known history of coronary disease I believe she warrants follow-up with cardiology.  Patient states that she will make an appointment.  In the interim if anything changes regarding her pain and its quality she is to let me know.  2.  CAD.  As above.  3.  Hypertension.  Blood pressure is well maintained.  Continue current therapy.  4.  Diabetes.  A1c is  6.0.  Previously 5.8.  Patient describes symptoms that might be consistent with intermittent hypoglycemia.  Will discontinue glipizide.  She is on a very small amount.  Hopefully dietary management  will be enough to keep her A1c at goal.  Reevaluate in 3 months.  Encouraged dietary management and focus on exercise.  Encouraged yearly retinal exams and daily feet exams.  5.  High cholesterol.  LDL 115.  Previously 86.  Continue current therapy.  Reevaluate follow-up.  If LDL continues to be above goal adjust treatment.  6.  Goiter.  TSH normal.  No complaints.  7.  Vitamin D deficiency.  Vitamin D level is 19.5.  Patient admits to noncompliance with vitamin D supplementation.  She will restart and we will check vitamin D level in 3 months.

## 2016-12-11 NOTE — Telephone Encounter (Signed)
Nurse from home health called to say that Ms Sarah Chavez be calling soon to schedule an appointment to discuss her pain issues and recheck her thyroid. She states she will be sending over notes from this visit as well.

## 2017-01-18 NOTE — Telephone Encounter (Signed)
Refuse auto refill rx's, Refills must be requested by pharmacy, pt or through Mychart

## 2017-01-25 MED ORDER — FAMOTIDINE 20 MG TAB
20 mg | ORAL_TABLET | ORAL | 0 refills | Status: DC
Start: 2017-01-25 — End: 2017-07-06

## 2017-01-26 NOTE — Telephone Encounter (Signed)
Refuse auto refill rx's, Refills must be requested by pharmacy, pt or through Mychart

## 2017-01-28 NOTE — Telephone Encounter (Signed)
Refuse auto refill rx's, Refills must be requested by pharmacy, pt or through Mychart

## 2017-02-02 ENCOUNTER — Other Ambulatory Visit: Admit: 2017-02-02 | Discharge: 2017-02-02 | Payer: PRIVATE HEALTH INSURANCE | Primary: Family Medicine

## 2017-02-02 DIAGNOSIS — I10 Essential (primary) hypertension: Secondary | ICD-10-CM

## 2017-02-03 LAB — METABOLIC PANEL, COMPREHENSIVE
A-G Ratio: 1.4 (ref 1.2–2.2)
ALT (SGPT): 15 IU/L (ref 0–32)
AST (SGOT): 18 IU/L (ref 0–40)
Albumin: 4.2 g/dL (ref 3.5–4.8)
Alk. phosphatase: 62 IU/L (ref 39–117)
BUN/Creatinine ratio: 17 (ref 12–28)
BUN: 18 mg/dL (ref 8–27)
Bilirubin, total: 0.3 mg/dL (ref 0.0–1.2)
CO2: 25 mmol/L (ref 18–29)
Calcium: 9.5 mg/dL (ref 8.7–10.3)
Chloride: 104 mmol/L (ref 96–106)
Creatinine: 1.04 mg/dL — ABNORMAL HIGH (ref 0.57–1.00)
GFR est AA: 59 mL/min/{1.73_m2} — ABNORMAL LOW (ref >59)
GFR est non-AA: 52 mL/min/{1.73_m2} — ABNORMAL LOW (ref >59)
GLOBULIN, TOTAL: 3 g/dL (ref 1.5–4.5)
Glucose: 109 mg/dL — ABNORMAL HIGH (ref 65–99)
Potassium: 4.5 mmol/L (ref 3.5–5.2)
Protein, total: 7.2 g/dL (ref 6.0–8.5)
Sodium: 143 mmol/L (ref 134–144)

## 2017-02-03 LAB — VITAMIN D, 25 HYDROXY: VITAMIN D, 25-HYDROXY: 18.6 ng/mL — ABNORMAL LOW (ref 30.0–100.0)

## 2017-02-03 LAB — LIPID PANEL
Cholesterol, total: 149 mg/dL (ref 100–199)
HDL Cholesterol: 64 mg/dL (ref >39)
LDL, calculated: 73 mg/dL (ref 0–99)
Triglyceride: 60 mg/dL (ref 0–149)
VLDL, calculated: 12 mg/dL (ref 5–40)

## 2017-02-03 LAB — HEMOGLOBIN A1C WITH EAG
Estimated average glucose: 131 mg/dL
Hemoglobin A1c: 6.2 % — ABNORMAL HIGH (ref 4.8–5.6)

## 2017-02-16 ENCOUNTER — Ambulatory Visit
Admit: 2017-02-16 | Discharge: 2017-02-16 | Payer: PRIVATE HEALTH INSURANCE | Attending: Family Medicine | Primary: Family Medicine

## 2017-02-16 DIAGNOSIS — M25561 Pain in right knee: Secondary | ICD-10-CM

## 2017-02-16 MED ORDER — MELOXICAM 7.5 MG TAB
7.5 mg | ORAL_TABLET | Freq: Two times a day (BID) | ORAL | 3 refills | Status: DC
Start: 2017-02-16 — End: 2017-07-06

## 2017-02-16 MED ORDER — HYDROCHLOROTHIAZIDE 12.5 MG CAP
12.5 mg | ORAL_CAPSULE | Freq: Every day | ORAL | 5 refills | Status: DC
Start: 2017-02-16 — End: 2017-06-21

## 2017-02-16 NOTE — Progress Notes (Signed)
SUBJECTIVE:   Sarah Chavez is a 76 y.o. female has a past medical history significant for DJD, hypertension, CAD, high cholesterol, diabetes, vitamin D deficiency, obesity and peripheral edema.  Current medications listed in the EMR and reviewed today.  Patient reports that her arthritic pain has been more prominent particularly in her right knee for the last couple weeks.  No trauma is reported.  In addition the patient reports that she is having some more edema than usual.  She reports no orthopnea or PND.  At previous visit she complained of some atypical chest pain.  She states this is gotten better but occasionally still feels "a little twinge in my chest".  She was supposed to followed up with her cardiologist but states that she has not done that.  In addition she was having some spells that sound consistent with hypoglycemia.  I discontinued her glipizide XL as her A1c was excellent.  She states that these spells have subsided.    HPI  See above    Past Medical History, Past Surgical History, Family history, Social History, and Medications were all reviewed with the patient today and updated as necessary.       Current Outpatient Prescriptions   Medication Sig Dispense Refill   ??? meloxicam (MOBIC) 7.5 mg tablet Take 1 Tab by mouth two (2) times a day. 180 Tab 3   ??? hydroCHLOROthiazide (MICROZIDE) 12.5 mg capsule Take 1 Cap by mouth daily. 30 Cap 5   ??? famotidine (PEPCID) 20 mg tablet take 1 tablet by mouth once daily 120 Tab 0   ??? CHOLECALCIFEROL, VITAMIN D3, (VITAMIN D3 PO) Take  by mouth.     ??? pravastatin (PRAVACHOL) 40 mg tablet Take 1 Tab by mouth daily. 30 Tab 5   ??? lisinopril-hydroCHLOROthiazide (PRINZIDE, ZESTORETIC) 20-12.5 mg per tablet Take 1 Tab by mouth daily. 90 Tab 3   ??? nitroglycerin (NITROSTAT) 0.4 mg SL tablet 0.4 mg by SubLINGual route every five (5) minutes as needed for Chest Pain (up to 3 doses).     ??? aspirin 81 mg chewable tablet Take 1 Tab by mouth daily. 30 Tab 11      Allergies   Allergen Reactions   ??? Ativan [Lorazepam] Other (comments)     Made her feel crazy and chest pressure   ??? Ativan [Lorazepam] Other (comments)     Chest fullness   ??? Other Medication Palpitations     Some type of decongestant   ??? Other Medication Other (comments)     Doesn't take decongestants, but can't recall why     Patient Active Problem List   Diagnosis Code   ??? HTN (hypertension) I10   ??? Diabetes mellitus type 2, controlled (HCC) E11.9   ??? Hip pain M25.559   ??? Unstable angina (HCC) I20.0   ??? Coronary artery disease involving native coronary artery of native heart without angina pectoris I25.10   ??? Dyslipidemia E78.5   ??? Morbid obesity (HCC) E66.01   ??? OSA on CPAP G47.33, Z99.89     Past Medical History:   Diagnosis Date   ??? Abdominal pain 07/22/2015   ??? Anemia    ??? Back pain    ??? Bursitis of shoulder    ??? CAD (coronary artery disease)    ??? Cerumen impaction    ??? Chest pain, atypical 07/22/2015   ??? Diabetes (HCC)    ??? Diabetes mellitus out of control (HCC)    ??? Endocrine disease     thyroid   ???  Fatigue    ??? Hip pain, right    ??? Hyperlipidemia, mild    ??? Hypertension    ??? Hypertension associated with diabetes (HCC) 07/22/2015   ??? 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 Left carotid bruit    ??? Lumbago 07/22/2015   ??? Morbid obesity (HCC) 07/22/2015   ??? OSA on CPAP 07/22/2015   ??? Other ill-defined conditions(799.89)     cholesterol   ??? Other ill-defined conditions(799.89)     heart cath 96   ??? Shoulder pain, acute    ??? Shoulder pain, bilateral    ??? Sinusitis, acute maxillary    ??? Unstable angina (HCC) 07/06/2015   ??? Vitamin D deficiency    ??? Weakness      Past Surgical History:   Procedure Laterality Date   ??? HX HEENT      goiter/thyroid surgery   ??? HX THYROIDECTOMY       Family History   Problem Relation Age of Onset   ??? Stroke Sister    ??? Cancer Sister      cervical   ??? Stroke Brother    ??? Diabetes Other    ??? Hypertension Other      Social History   Substance Use Topics    ??? Smoking status: Former Smoker     Quit date: 1971   ??? Smokeless tobacco: Never Used      Comment: quit in 1971   ??? Alcohol use No         Review of Systems  See above    OBJECTIVE:  Visit Vitals   ??? BP 130/70   ??? Ht 5\' 2"  (1.575 m)   ??? Wt 218 lb 9.6 oz (99.2 kg)   ??? BMI 39.98 kg/m2        Physical Exam   Constitutional: She is oriented to person, place, and time. She appears well-developed and well-nourished.   HENT:   Head: Normocephalic and atraumatic.   Eyes: EOM are normal. Pupils are equal, round, and reactive to light.   Neck: Normal range of motion. Neck supple. No JVD present. No thyromegaly present.   Cardiovascular: Normal rate, regular rhythm and normal heart sounds.  Exam reveals no gallop and no friction rub.    No murmur heard.  Pulmonary/Chest: Effort normal. No respiratory distress. She has no wheezes. She has no rales.   Abdominal: Soft. There is no tenderness. There is no rebound and no guarding.   Musculoskeletal: Normal range of motion. She exhibits edema.   Lower extremities demonstrate trace pitting edema to the mid shins bilaterally.  Slightly more prominent on the right leg than on the left.  Examination of the patient's knees bilaterally reveals no gross deformity or swelling.  Full range of motion is noted.   Neurological: She is alert and oriented to person, place, and time.   Skin: No rash noted.   Psychiatric: She has a normal mood and affect.       Medical problems and test results were reviewed with the patient today.         ASSESSMENT and PLAN    1.  Right knee pain.  History of degenerative arthritis.  Continue Mobic as needed.  Encourage the use of ice as well.  If symptoms persist refer to orthopedist for consideration of more aggressive management to include injections and/ord knee replacement.  2.  Hypertension.  Blood pressures well maintained.  Continue current therapy.  3.  CAD.  Per  cardiology.  Because of her atypical chest discomfort I  strongly encouraged her to follow-up with cardiology.  She states that she will make an appointment today.  4.  Atypical chest pain.  As above.  5.  High cholesterol.  LDL 73.  Previously 115.  Continue current therapy.  6.  Diabetes.  Off of meds.  A1c is 6.2.  Previously 6.0.  Congratulated the patient on her efforts.  Continue dietary focus.  7.  Vitamin D deficiency.  Vitamin D level was 18.6.  Previously 19.5.  Patient reports noncompliance with vitamin D supplements.  Currently encouraged compliance.  8.  Peripheral edema.  Continue Zestoretic.  Will add an additional low-dose HCTZ 12.5 mg 1 a day.  Encourage elevation of lower extremities and use of support hose as well.  9.  Obesity.  BMI 39.9.  Dietary management and counseling provided.

## 2017-02-19 ENCOUNTER — Ambulatory Visit
Admit: 2017-02-19 | Discharge: 2017-02-19 | Payer: PRIVATE HEALTH INSURANCE | Attending: Cardiovascular Disease | Primary: Family Medicine

## 2017-02-19 DIAGNOSIS — R079 Chest pain, unspecified: Secondary | ICD-10-CM

## 2017-02-19 MED ORDER — NITROGLYCERIN 0.4 MG SUBLINGUAL TAB
0.4 mg | SUBLINGUAL | 99 refills | Status: DC
Start: 2017-02-19 — End: 2020-01-09

## 2017-02-19 NOTE — Progress Notes (Signed)
[address], [id number]  [place], [place] [id number]  PHONE: [phone]    Sarah RayVernell G Lacina  October 14, 1940      SUBJECTIVE:   Sarah Chavez is a 76 y.o. female seen for a follow up visit regarding the following:     Chief Complaint   Patient presents with   ??? Chest Pain   ??? Irregular Heart Beat       HPI:    HPI Comments: The patient is a 76 year old female returns for follow-up of a history of some mild nonobstructive coronary disease on heart catheterization in October 2016.  She had some atypical chest pain at that time was [facility].  Her LV function was normal.  She has been on statin therapy and hypertensive medications.  He has not been back since last year.  She occasionally has a sharp atypical sounding chest pain she has a very difficult time describing.  She has a lot of orthopedic issues and cannot ambulate well at all.  Apparently had some swelling in ankle or leg but that seems to be better.  Denies any PND orthopnea or palpitations.    Chest Pain    Associated symptoms include palpitations. Pertinent negatives include no irregular heartbeat and no shortness of breath.   Irregular Heart Beat    Associated symptoms include chest pain. Pertinent negatives include no irregular heartbeat and no shortness of breath.       Past Medical History, Past Surgical History, Family history, Social History, and Medications were all reviewed with the patient today and updated as necessary.     Outpatient Prescriptions Marked as Taking for the 02/19/17 encounter (Office Visit) with Maree KrabbeNed D Caidon Foti, MD   Medication Sig Dispense Refill   ??? nitroglycerin (NITROSTAT) 0.4 mg SL tablet Place 1 sl under the tongue q 5 min prn cp, max 3 sl in a 15-min time period. Call 911 if no relief after the 3rd sl. 1 Bottle prn   ??? meloxicam (MOBIC) 7.5 mg tablet Take 1 Tab by mouth two (2) times a day. 180 Tab 3   ??? hydroCHLOROthiazide (MICROZIDE) 12.5 mg capsule Take 1 Cap by mouth daily. 30 Cap 5    ??? famotidine (PEPCID) 20 mg tablet take 1 tablet by mouth once daily 120 Tab 0   ??? CHOLECALCIFEROL, VITAMIN D3, (VITAMIN D3 PO) Take  by mouth.     ??? pravastatin (PRAVACHOL) 40 mg tablet Take 1 Tab by mouth daily. 30 Tab 5   ??? lisinopril-hydroCHLOROthiazide (PRINZIDE, ZESTORETIC) 20-12.5 mg per tablet Take 1 Tab by mouth daily. 90 Tab 3   ??? aspirin 81 mg chewable tablet Take 1 Tab by mouth daily. 30 Tab 11     Allergies   Allergen Reactions   ??? Ativan [Lorazepam] Other (comments)     Made her feel crazy and chest pressure   ??? Ativan [Lorazepam] Other (comments)     Chest fullness   ??? Other Medication Palpitations     Some type of decongestant   ??? Other Medication Other (comments)     Doesn't take decongestants, but can't recall why     Past Medical History:   Diagnosis Date   ??? Abdominal pain 07/22/2015   ??? Anemia    ??? Back pain    ??? Bursitis of shoulder    ??? CAD (coronary artery disease)    ??? Cerumen impaction    ??? Chest pain, atypical 07/22/2015   ??? Diabetes (HCC)    ???  Diabetes mellitus out of control [facility])    ??? Endocrine disease     thyroid   ??? Fatigue    ??? Hip pain, right    ??? Hyperlipidemia, mild    ??? Hypertension    ??? Hypertension associated with diabetes (HCC) 07/22/2015   ??? 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 Left carotid bruit    ??? Lumbago 07/22/2015   ??? Morbid obesity (HCC) 07/22/2015   ??? OSA on CPAP 07/22/2015   ??? Other ill-defined conditions(799.89)     cholesterol   ??? Other ill-defined conditions(799.89)     heart cath 96   ??? Shoulder pain, acute    ??? Shoulder pain, bilateral    ??? Sinusitis, acute maxillary    ??? Unstable angina (HCC) 07/06/2015   ??? Vitamin D deficiency    ??? Weakness      Past Surgical History:   Procedure Laterality Date   ??? HX HEENT      goiter/thyroid surgery   ??? HX THYROIDECTOMY       Family History   Problem Relation Age of Onset   ??? Stroke Sister    ??? Cancer Sister      cervical   ??? Stroke Brother    ??? Diabetes Other    ??? Hypertension Other        Social History   Substance Use Topics   ??? Smoking status: Former Smoker     Quit date: 1971   ??? Smokeless tobacco: Never Used      Comment: quit in 1971   ??? Alcohol use No       ROS:    Review of Systems   Constitution: Negative for decreased appetite.   Cardiovascular: Positive for chest pain and palpitations. Negative for dyspnea on exertion and irregular heartbeat.   Respiratory: Negative for shortness of breath.            PHYSICAL EXAM:    Visit Vitals   ??? BP 130/62   ??? Pulse 69   ??? Ht 5\' 2"  (1.575 m)   ??? Wt 216 lb (98 kg)   ??? BMI 39.51 kg/m2          Wt Readings from Last 3 Encounters:   02/19/17 216 lb (98 kg)   02/16/17 218 lb 9.6 oz (99.2 kg)   09/21/16 217 lb 6.4 oz (98.6 kg)     BP Readings from Last 3 Encounters:   02/19/17 130/62   02/16/17 130/70   11/02/16 126/82         Physical Exam    Medical problems and test results were reviewed with the patient today.     No results found for any visits on 02/19/17.  Lab Results   Component Value Date/Time    Sodium 143 02/02/2017 09:18 AM    Potassium 4.5 02/02/2017 09:18 AM    Chloride 104 02/02/2017 09:18 AM    CO2 25 02/02/2017 09:18 AM    Anion gap 7 01/08/2016 12:20 PM    Glucose 109 (H) 02/02/2017 09:18 AM    BUN 18 02/02/2017 09:18 AM    Creatinine 1.04 (H) 02/02/2017 09:18 AM    BUN/Creatinine ratio 17 02/02/2017 09:18 AM    GFR est AA 59 (L) 02/02/2017 09:18 AM    GFR est non-AA 52 (L) 02/02/2017 09:18 AM    Calcium 9.5 02/02/2017 09:18 AM     Lab Results   Component Value Date/Time    Cholesterol, total 149  02/02/2017 09:18 AM    HDL Cholesterol 64 02/02/2017 09:18 AM    LDL, calculated 73 02/02/2017 09:18 AM    VLDL, calculated 12 02/02/2017 09:18 AM    Triglyceride 60 02/02/2017 09:18 AM    CHOL/HDL Ratio 2.5 07/07/2015 05:15 AM         ASSESSMENT and PLAN    Diagnoses and all orders for this visit:    1. Chest pain, unspecified type patient has some occasional atypical chest pains.  She had no obstructive disease a year and a half ago.  EKG shows  no ST changes.  I discussed the options of considering a Lexiscan nuclear study but she says she does not really want to go through 1 of those right now.  Will therefore continue on her medication  -     AMB POC EKG ROUTINE W/ 12 LEADS, INTER & REP    2. Essential hypertension overall she says is been stable continue medication    3. Coronary artery disease involving native coronary artery of native heart without angina pectoris we will continue medication    4. Dyslipidemia cholesterol is improved LDLs right at 70 and HDL stable.    5. Controlled type 2 diabetes mellitus without complication, without long-term current use of insulin (HCC)    6. Morbid obesity (HCC) I encouraged her to continue to lose weight.    Other orders  -     nitroglycerin (NITROSTAT) 0.4 mg SL tablet; Place 1 sl under the tongue q 5 min prn cp, max 3 sl in a 15-min time period. Call 911 if no relief after the 3rd sl.               Follow-up Disposition:  Return in about 4 months (around 06/21/2017).    Maree Krabbe, MD  02/19/2017  5:03 PM

## 2017-04-16 NOTE — Telephone Encounter (Signed)
Refuse auto refill rx's, Refills must be requested by pharmacy, pt or through Mychart

## 2017-06-21 ENCOUNTER — Ambulatory Visit
Admit: 2017-06-21 | Discharge: 2017-06-21 | Payer: PRIVATE HEALTH INSURANCE | Attending: Cardiovascular Disease | Primary: Family Medicine

## 2017-06-21 ENCOUNTER — Other Ambulatory Visit: Admit: 2017-06-21 | Discharge: 2017-06-21 | Payer: PRIVATE HEALTH INSURANCE | Primary: Family Medicine

## 2017-06-21 DIAGNOSIS — I10 Essential (primary) hypertension: Secondary | ICD-10-CM

## 2017-06-21 NOTE — Progress Notes (Signed)
[address], [address]  [place], [place] [address]  PHONE: [phone]    Sarah Chavez  20-Feb-1941      SUBJECTIVE:   Sarah Chavez is a 76 y.o. female seen for a follow up visit regarding the following:     Chief Complaint   Patient presents with   ??? Follow Up Chronic Condition   ??? Hypertension       HPI:    HPI Comments: 76 year old female returns for follow-up of history of some mild coronary disease and occasional atypical chest pain.  Last time I saw her I recommended a nuclear study but she did not want to do that.  She has been stable since that time with no complaints.  Overall she says she has been doing okay.      Past Medical History, Past Surgical History, Family history, Social History, and Medications were all reviewed with the patient today and updated as necessary.     Outpatient Prescriptions Marked as Taking for the 06/21/17 encounter (Office Visit) with Maree Krabbe, MD   Medication Sig Dispense Refill   ??? nitroglycerin (NITROSTAT) 0.4 mg SL tablet Place 1 sl under the tongue q 5 min prn cp, max 3 sl in a 15-min time period. Call 911 if no relief after the 3rd sl. 1 Bottle prn   ??? meloxicam (MOBIC) 7.5 mg tablet Take 1 Tab by mouth two (2) times a day. 180 Tab 3   ??? famotidine (PEPCID) 20 mg tablet take 1 tablet by mouth once daily 120 Tab 0   ??? CHOLECALCIFEROL, VITAMIN D3, (VITAMIN D3 PO) Take  by mouth.     ??? pravastatin (PRAVACHOL) 40 mg tablet Take 1 Tab by mouth daily. 30 Tab 5   ??? lisinopril-hydroCHLOROthiazide (PRINZIDE, ZESTORETIC) 20-12.5 mg per tablet Take 1 Tab by mouth daily. 90 Tab 3   ??? aspirin 81 mg chewable tablet Take 1 Tab by mouth daily. 30 Tab 11     Allergies   Allergen Reactions   ??? Ativan [Lorazepam] Other (comments)     Made her feel crazy and chest pressure   ??? Ativan [Lorazepam] Other (comments)     Chest fullness   ??? Other Medication Palpitations     Some type of decongestant   ??? Other Medication Other (comments)      Doesn't take decongestants, but can't recall why     Past Medical History:   Diagnosis Date   ??? Abdominal pain 07/22/2015   ??? Anemia    ??? Back pain    ??? Bursitis of shoulder    ??? CAD (coronary artery disease)    ??? Cerumen impaction    ??? Chest pain, atypical 07/22/2015   ??? Diabetes (HCC)    ??? Diabetes mellitus out of control (HCC)    ??? Endocrine disease     thyroid   ??? Fatigue    ??? Hip pain, right    ??? Hyperlipidemia, mild    ??? Hypertension    ??? Hypertension associated with diabetes (HCC) 07/22/2015   ??? 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 Left carotid bruit    ??? Lumbago 07/22/2015   ??? Morbid obesity (HCC) 07/22/2015   ??? OSA on CPAP 07/22/2015   ??? Other ill-defined conditions(799.89)     cholesterol   ??? Other ill-defined conditions(799.89)     heart cath 96   ??? Shoulder pain, acute    ??? Shoulder pain, bilateral    ??? Sinusitis,  acute maxillary    ??? Unstable angina (HCC) 07/06/2015   ??? Vitamin D deficiency    ??? Weakness      Past Surgical History:   Procedure Laterality Date   ??? HX HEENT      goiter/thyroid surgery   ??? HX THYROIDECTOMY       Family History   Problem Relation Age of Onset   ??? Stroke Sister    ??? Cancer Sister      cervical   ??? Stroke Brother    ??? Diabetes Other    ??? Hypertension Other       Social History   Substance Use Topics   ??? Smoking status: Former Smoker     Quit date: 1971   ??? Smokeless tobacco: Never Used      Comment: quit in 1971   ??? Alcohol use No       ROS:    Review of Systems   Constitution: Negative for decreased appetite.   Cardiovascular: Negative for chest pain, irregular heartbeat, leg swelling and near-syncope.   Respiratory: Negative for shortness of breath.            PHYSICAL EXAM:    Visit Vitals   ??? BP (!) 140/96   ??? Pulse 64   ??? Ht  (1.575 m)   ??? Wt 218 lb (98.9 kg)   ??? BMI 39.87 kg/m2          Wt Readings from Last 3 Encounters:   06/21/17 218 lb (98.9 kg)   02/19/17 216 lb (98 kg)   02/16/17 218 lb 9.6 oz (99.2 kg)      BP Readings from Last 3 Encounters:   06/21/17 (!) 140/96   02/19/17 130/62   02/16/17 130/70         Physical Exam   Constitutional: She appears well-developed. No distress.   Eyes: Pupils are equal, round, and reactive to light.   Cardiovascular: Normal rate and regular rhythm.    No murmur heard.  Pulmonary/Chest: Effort normal.   Abdominal: Soft.   Musculoskeletal: She exhibits no edema.   Neurological: She is alert.       Medical problems and test results were reviewed with the patient today.     No results found for any visits on 06/21/17.  Lab Results   Component Value Date/Time    Sodium 143 02/02/2017 09:18 AM    Potassium 4.5 02/02/2017 09:18 AM    Chloride 104 02/02/2017 09:18 AM    CO2 25 02/02/2017 09:18 AM    Anion gap 7 01/08/2016 12:20 PM    Glucose 109 (H) 02/02/2017 09:18 AM    BUN 18 02/02/2017 09:18 AM    Creatinine 1.04 (H) 02/02/2017 09:18 AM    BUN/Creatinine ratio 17 02/02/2017 09:18 AM    GFR est AA 59 (L) 02/02/2017 09:18 AM    GFR est non-AA 52 (L) 02/02/2017 09:18 AM    Calcium 9.5 02/02/2017 09:18 AM     Lab Results   Component Value Date/Time    Cholesterol, total 149 02/02/2017 09:18 AM    HDL Cholesterol 64 02/02/2017 09:18 AM    LDL, calculated 73 02/02/2017 09:18 AM    VLDL, calculated 12 02/02/2017 09:18 AM    Triglyceride 60 02/02/2017 09:18 AM    CHOL/HDL Ratio 2.5 07/07/2015 05:15 AM         ASSESSMENT and PLAN    Diagnoses and all orders for this visit:    1. Essential  hypertension overall stable on medication    2. Coronary artery disease involving native coronary artery of native heart without angina pectoris she seems to be doing well on medications at this time    3. Dyslipidemia we will continue statin therapy she can get her labs done in December by her family doctor    4. Morbid obesity (HCC) encouraged to continue to lose weight             Follow-up Disposition: Not on File    Maree Krabbe, MD  06/21/2017  10:38 AM

## 2017-06-22 ENCOUNTER — Encounter: Primary: Family Medicine

## 2017-06-22 LAB — METABOLIC PANEL, COMPREHENSIVE
A-G Ratio: 1.2 (ref 1.2–2.2)
ALT (SGPT): 15 IU/L (ref 0–32)
AST (SGOT): 20 IU/L (ref 0–40)
Albumin: 4.4 g/dL (ref 3.5–4.8)
Alk. phosphatase: 63 IU/L (ref 39–117)
BUN/Creatinine ratio: 16 (ref 12–28)
BUN: 20 mg/dL (ref 8–27)
Bilirubin, total: 0.5 mg/dL (ref 0.0–1.2)
CO2: 23 mmol/L (ref 20–29)
Calcium: 9.7 mg/dL (ref 8.7–10.3)
Chloride: 99 mmol/L (ref 96–106)
Creatinine: 1.24 mg/dL — ABNORMAL HIGH (ref 0.57–1.00)
GFR est AA: 48 mL/min/{1.73_m2} — ABNORMAL LOW (ref >59)
GFR est non-AA: 42 mL/min/{1.73_m2} — ABNORMAL LOW (ref >59)
GLOBULIN, TOTAL: 3.7 g/dL (ref 1.5–4.5)
Glucose: 104 mg/dL — ABNORMAL HIGH (ref 65–99)
Potassium: 4.1 mmol/L (ref 3.5–5.2)
Protein, total: 8.1 g/dL (ref 6.0–8.5)
Sodium: 141 mmol/L (ref 134–144)

## 2017-06-22 LAB — HEMOGLOBIN A1C WITH EAG
Estimated average glucose: 134 mg/dL
Hemoglobin A1c: 6.3 % — ABNORMAL HIGH (ref 4.8–5.6)

## 2017-06-22 LAB — VITAMIN D, 25 HYDROXY: VITAMIN D, 25-HYDROXY: 21.8 ng/mL — ABNORMAL LOW (ref 30.0–100.0)

## 2017-06-22 LAB — LIPID PANEL
Cholesterol, total: 146 mg/dL (ref 100–199)
HDL Cholesterol: 58 mg/dL (ref >39)
LDL, calculated: 76 mg/dL (ref 0–99)
Triglyceride: 58 mg/dL (ref 0–149)
VLDL, calculated: 12 mg/dL (ref 5–40)

## 2017-07-06 ENCOUNTER — Ambulatory Visit
Admit: 2017-07-06 | Discharge: 2017-07-06 | Payer: PRIVATE HEALTH INSURANCE | Attending: Family Medicine | Primary: Family Medicine

## 2017-07-06 DIAGNOSIS — M1990 Unspecified osteoarthritis, unspecified site: Secondary | ICD-10-CM

## 2017-07-06 MED ORDER — HYDROCHLOROTHIAZIDE 12.5 MG CAP
12.5 mg | ORAL_CAPSULE | Freq: Every day | ORAL | 5 refills | Status: DC
Start: 2017-07-06 — End: 2017-07-31

## 2017-07-06 MED ORDER — LISINOPRIL-HYDROCHLOROTHIAZIDE 20 MG-12.5 MG TAB
ORAL_TABLET | Freq: Every day | ORAL | 3 refills | Status: DC
Start: 2017-07-06 — End: 2018-06-01

## 2017-07-06 MED ORDER — PRAVASTATIN 40 MG TAB
40 mg | ORAL_TABLET | Freq: Every day | ORAL | 3 refills | Status: DC
Start: 2017-07-06 — End: 2017-10-06

## 2017-07-06 MED ORDER — IBUPROFEN 800 MG TAB
800 mg | ORAL_TABLET | ORAL | 3 refills | Status: DC
Start: 2017-07-06 — End: 2017-09-28

## 2017-07-06 MED ORDER — FAMOTIDINE 20 MG TAB
20 mg | ORAL_TABLET | ORAL | 3 refills | Status: DC
Start: 2017-07-06 — End: 2018-08-16

## 2017-07-06 NOTE — Progress Notes (Signed)
SUBJECTIVE:   Sarah Chavez is a 76 y.o. female who has past medical history significant for degenerative arthritis, diabetes, hypertension, high cholesterol, CAD, lower extremity edema, vitamin D deficiency and morbid obesity.  Current medications listed in the EMR and reviewed today.  At previous visit the patient had complained of persistent lower extremity edema.  Low-dose HCTZ 12.5 mg 1 a day was added to her Zestoretic.  She states she is noted significant improvement in her edema.  She was also instructed to elevate her lower extremities and wear support hose.  Patient was also complaining of some atypical chest pain.  I recommended she follow-up with her cardiologist.  She did such and it was recommended for her to undergo nuclear stress testing.  Patient respectfully declined.  She reports no significant changes in symptoms since previous visit.  She reports no exertional chest pain or orthopnea.  Her current medicines are listed in the EMR and reviewed today.  She reports compliance.    HPI  See above    Past Medical History, Past Surgical History, Family history, Social History, and Medications were all reviewed with the patient today and updated as necessary.       Current Outpatient Medications   Medication Sig Dispense Refill   ??? lisinopril-hydroCHLOROthiazide (PRINZIDE, ZESTORETIC) 20-12.5 mg per tablet Take 1 Tab by mouth daily. 90 Tab 3   ??? pravastatin (PRAVACHOL) 40 mg tablet Take 1 Tab by mouth daily. 90 Tab 3   ??? famotidine (PEPCID) 20 mg tablet take 1 tablet by mouth once daily 90 Tab 3   ??? ibuprofen (MOTRIN) 800 mg tablet 1 tablet twice a day as needed 60 Tab 3   ??? hydroCHLOROthiazide (MICROZIDE) 12.5 mg capsule Take 1 Cap by mouth daily. 30 Cap 5   ??? nitroglycerin (NITROSTAT) 0.4 mg SL tablet Place 1 sl under the tongue q 5 min prn cp, max 3 sl in a 15-min time period. Call 911 if no relief after the 3rd sl. 1 Bottle prn   ??? CHOLECALCIFEROL, VITAMIN D3, (VITAMIN D3 PO) Take  by mouth.      ??? aspirin 81 mg chewable tablet Take 1 Tab by mouth daily. 30 Tab 11     Allergies   Allergen Reactions   ??? Ativan [Lorazepam] Other (comments)     Made her feel crazy and chest pressure   ??? Ativan [Lorazepam] Other (comments)     Chest fullness   ??? Other Medication Palpitations     Some type of decongestant   ??? Other Medication Other (comments)     Doesn't take decongestants, but can't recall why     Patient Active Problem List   Diagnosis Code   ??? HTN (hypertension) I10   ??? Diabetes mellitus type 2, controlled (HCC) E11.9   ??? Hip pain M25.559   ??? Unstable angina (HCC) I20.0   ??? Coronary artery disease involving native coronary artery of native heart without angina pectoris I25.10   ??? Dyslipidemia E78.5   ??? Morbid obesity (HCC) E66.01   ??? OSA on CPAP G47.33, Z99.89     Past Medical History:   Diagnosis Date   ??? Abdominal pain 07/22/2015   ??? Anemia    ??? Back pain    ??? Bursitis of shoulder    ??? CAD (coronary artery disease)    ??? Cerumen impaction    ??? Chest pain, atypical 07/22/2015   ??? Diabetes (HCC)    ??? Diabetes mellitus out of control (HCC)    ???  Endocrine disease     thyroid   ??? Fatigue    ??? Hip pain, right    ??? Hyperlipidemia, mild    ??? Hypertension    ??? Hypertension associated with diabetes (HCC) 07/22/2015   ??? 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 Left carotid bruit    ??? Lumbago 07/22/2015   ??? Morbid obesity (HCC) 07/22/2015   ??? OSA on CPAP 07/22/2015   ??? Other ill-defined conditions(799.89)     cholesterol   ??? Other ill-defined conditions(799.89)     heart cath 96   ??? Shoulder pain, acute    ??? Shoulder pain, bilateral    ??? Sinusitis, acute maxillary    ??? Unstable angina (HCC) 07/06/2015   ??? Vitamin D deficiency    ??? Weakness      Past Surgical History:   Procedure Laterality Date   ??? HX HEENT      goiter/thyroid surgery   ??? HX THYROIDECTOMY       Family History   Problem Relation Age of Onset   ??? Stroke Sister    ??? Cancer Sister         cervical   ??? Stroke Brother    ??? Diabetes Other     ??? Hypertension Other      Social History     Tobacco Use   ??? Smoking status: Former Smoker     Last attempt to quit: 1971     Years since quitting: 47.8   ??? Smokeless tobacco: Never Used   ??? Tobacco comment: quit in 1971   Substance Use Topics   ??? Alcohol use: No         Review of Systems  See above    OBJECTIVE:  Visit Vitals  BP 118/76 (BP 1 Location: Left arm, BP Patient Position: Sitting)   Ht 5\' 2"  (1.575 m)   Wt 218 lb (98.9 kg)   BMI 39.87 kg/m??        Physical Exam   Constitutional: She is oriented to person, place, and time.   Patient is morbidly obese.   HENT:   Head: Normocephalic and atraumatic.   Eyes: EOM are normal. Pupils are equal, round, and reactive to light.   Neck: Normal range of motion. Neck supple. No JVD present. No thyromegaly present.   Cardiovascular: Normal rate, regular rhythm and normal heart sounds. Exam reveals no gallop and no friction rub.   No murmur heard.  Pulmonary/Chest: Effort normal. No respiratory distress. She has no wheezes. She has no rales.   Abdominal: Soft. There is no tenderness. There is no rebound and no guarding.   Musculoskeletal: Normal range of motion. She exhibits edema.   Lower extremities reveal trace pitting edema up to the mid shins bilaterally.   Neurological: She is alert and oriented to person, place, and time.   Skin: No rash noted.   Psychiatric: She has a normal mood and affect.       Medical problems and test results were reviewed with the patient today.         ASSESSMENT and PLAN    1.  Degenerative arthritis.  Patient is requesting ibuprofen 800 mg as opposed to meloxicam.  Instructed the patient to use the ibuprofen minimally, ideally no more than twice a day.  She is to push p.o. fluids.  Will monitor her renal function closely.    2.  Diabetes.  A1c is 6.3.  Previously 6.2.  She is off of  meds.  Encouraged yearly retinal exams and daily feet exams.  Flu vaccine is offered and respectfully declined by the patient.     3.  Hypertension.  Blood pressure is well maintained.  Continue current therapy.    4.  High cholesterol.  LDL 76.  Continue current therapy.    5.  Renal insufficiency.  Creatinine is 1.2.  Previously 1.0.  Push p.o. fluids as the patient admits to inadequate fluid intake.  Monitor renal function with the addition of ibuprofen.  If creatinine is rising we will discontinue.    6.  CAD.  Per cardiology.  No chest pain reported.    7.  Edema.  Much improved with the addition of HCTZ to the Zestoretic.  Encouraged increasing use of support hose and elevation of lower extremities.    8.  Vitamin D deficiency.  Vitamin D level is 21.  Previously 18.  Continue to encourage compliance with supplementation.    9.  Morbid obesity.  BMI is 39.8.  Dietary counseling provided.    Elements of this note have been dictated using speech recognition software. As a result, errors of speech recognition may have occurred.

## 2017-07-31 ENCOUNTER — Emergency Department: Admit: 2017-07-31 | Payer: PRIVATE HEALTH INSURANCE | Primary: Family Medicine

## 2017-07-31 ENCOUNTER — Inpatient Hospital Stay
Admit: 2017-07-31 | Discharge: 2017-08-01 | Disposition: A | Discharge status: Home / Self Care | Payer: PRIVATE HEALTH INSURANCE | Attending: Emergency Medicine

## 2017-07-31 DIAGNOSIS — R51 Headache: Secondary | ICD-10-CM

## 2017-07-31 LAB — METABOLIC PANEL, COMPREHENSIVE
A-G Ratio: 0.8
ALT (SGPT): 19 U/L (ref 12–65)
AST (SGOT): 18 U/L (ref 15–37)
Albumin: 3.7 g/dL (ref 3.2–4.6)
Alk. phosphatase: 63 U/L (ref 50–136)
Anion gap: 11 mmol/L
BUN: 22 MG/DL (ref 8–23)
Bilirubin, total: 0.4 MG/DL (ref 0.2–1.1)
CO2: 27 mmol/L (ref 21–32)
Calcium: 9.1 MG/DL (ref 8.3–10.4)
Chloride: 100 mmol/L (ref 98–107)
Creatinine: 1.7 MG/DL — ABNORMAL HIGH (ref 0.6–1.0)
GFR est AA: 37 mL/min/{1.73_m2} — ABNORMAL LOW (ref >60)
GFR est non-AA: 31 mL/min/{1.73_m2}
Globulin: 4.6 g/dL — ABNORMAL HIGH (ref 2.3–3.5)
Glucose: 127 mg/dL — ABNORMAL HIGH (ref 65–100)
Potassium: 3.5 mmol/L (ref 3.5–5.1)
Protein, total: 8.3 g/dL
Sodium: 138 mmol/L (ref 136–145)

## 2017-07-31 LAB — CBC WITH AUTOMATED DIFF
ABS. BASOPHILS: 0 10*3/uL (ref 0.0–0.2)
ABS. EOSINOPHILS: 0.1 10*3/uL (ref 0.0–0.8)
ABS. IMM. GRANS.: 0 10*3/uL (ref 0.0–0.5)
ABS. LYMPHOCYTES: 2 10*3/uL (ref 0.5–4.6)
ABS. MONOCYTES: 0.8 10*3/uL (ref 0.1–1.3)
ABS. NEUTROPHILS: 4 10*3/uL (ref 1.7–8.2)
ABSOLUTE NRBC: 0 10*3/uL (ref 0.0–0.2)
BASOPHILS: 0 % (ref 0.0–2.0)
EOSINOPHILS: 2 % (ref 0.5–7.8)
HCT: 38.6 % (ref 35.8–46.3)
HGB: 12.5 g/dL (ref 11.7–15.4)
IMMATURE GRANULOCYTES: 0 % (ref 0.0–5.0)
LYMPHOCYTES: 29 % (ref 13–44)
MCH: 27.8 PG (ref 26.1–32.9)
MCHC: 32.4 g/dL (ref 31.4–35.0)
MCV: 85.8 FL (ref 79.6–97.8)
MONOCYTES: 12 % (ref 4.0–12.0)
MPV: 11 FL (ref 9.4–12.3)
NEUTROPHILS: 57 % (ref 43–78)
PLATELET: 163 10*3/uL (ref 150–450)
RBC: 4.5 M/uL (ref 4.05–5.2)
RDW: 13.6 %
WBC: 7 10*3/uL (ref 4.3–11.1)

## 2017-07-31 LAB — POC TROPONIN: Troponin-I (POC): 0.01 ng/ml — ABNORMAL LOW (ref 0.02–0.05)

## 2017-07-31 LAB — MAGNESIUM: Magnesium: 1.9 mg/dL (ref 1.8–2.4)

## 2017-07-31 LAB — TSH 3RD GENERATION: TSH: 1.61 u[IU]/mL

## 2017-07-31 MED ORDER — ACETAMINOPHEN 325 MG TABLET
325 mg | ORAL | Status: AC
Start: 2017-07-31 — End: 2017-07-31
  Administered 2017-07-31: 23:00:00 via ORAL

## 2017-07-31 MED ORDER — ALUM-MAG HYDROXIDE-SIMETH 200 MG-200 MG-20 MG/5 ML ORAL SUSP
200-200-20 mg/5 mL | ORAL | Status: AC
Start: 2017-07-31 — End: 2017-07-31
  Administered 2017-07-31: 23:00:00 via ORAL

## 2017-07-31 MED FILL — TYLENOL 325 MG TABLET: 325 mg | ORAL | Qty: 2

## 2017-07-31 MED FILL — MAG-AL PLUS 200 MG-200 MG-20 MG/5 ML ORAL SUSPENSION: 200-200-20 mg/5 mL | ORAL | Qty: 30

## 2017-07-31 NOTE — ED Provider Notes (Addendum)
76 year old female with a number of complaints.  One is a left frontal headache over the last 24 hours.  Gradual onset.  Not the worst headache she ever had a not the first of this type.  He states she's had these off and on on occasion for months.  Headache is not associated with any trauma, visual changes, focal numbness or weakness.  She complains also 3 day history of waxing and waning chest pain that somewhat worse at night but present during the day as well has been with her all day and seem to worsen since last night.  Not pleuritic.  No shortness of breath.  She has had a cough for 1 week is nonproductive.  No fever or chills.  Discomfort is substernal without radiation.  There is no nausea vomiting or diaphoresis.  No trauma.  A history of DVT or PE.  Remote history of thyroid disease.  Has hypertension diabetes.  Patient also states she's had palpitations that are sometimes associated with chest pain and sometimes not in the last 3 days as well.  She has noticed a low blood pressure also and had readings as low as in the 06T systolic today.  Has lightheadedness but no near syncope or passing out.      The history is provided by the patient and a relative.   Headache    This is a new problem. The current episode started yesterday. The problem occurs constantly. The problem has not changed since onset.The quality of the pain is described as dull. The pain is mild. Associated symptoms include palpitations. Pertinent negatives include no fever, no near-syncope, no syncope, no shortness of breath, no weakness, no dizziness, no nausea and no vomiting. She has tried nothing for the symptoms.   Chest Pain (Angina)    This is a new problem. The current episode started more than 2 days ago. The problem has not changed since onset.The problem occurs constantly. The pain is present in the substernal region. The pain is mild. The quality of the pain is described as pressure-like. The pain does not radiate.  Associated symptoms include headaches and palpitations. Pertinent negatives include no abdominal pain, no back pain, no cough, no dizziness, no fever, no nausea, no near-syncope, no numbness, no shortness of breath, no syncope, no vomiting and no weakness.        Past Medical History:   Diagnosis Date   ??? Abdominal pain 07/22/2015   ??? Anemia    ??? Back pain    ??? Bursitis of shoulder    ??? CAD (coronary artery disease)    ??? Cerumen impaction    ??? Chest pain, atypical 07/22/2015   ??? Diabetes ([place])    ??? Diabetes mellitus out of control ([place])    ??? Endocrine disease     thyroid   ??? Fatigue    ??? Hip pain, right    ??? Hyperlipidemia, mild    ??? Hypertension    ??? Hypertension associated with diabetes ([place]) 07/22/2015   ??? Hypertensive pulmonary vascular disease ([place]) 07/22/2015   ??? Left carotid bruit    ??? Lumbago 07/22/2015   ??? Morbid obesity ([place]) 07/22/2015   ??? OSA on CPAP 07/22/2015   ??? Other ill-defined conditions(799.89)     cholesterol   ??? Other ill-defined conditions(799.89)     heart cath 96   ??? Shoulder pain, acute    ??? Shoulder pain, bilateral    ??? Sinusitis, acute maxillary    ??? Unstable angina (  [place]) 07/06/2015   ??? Vitamin D deficiency    ??? Weakness        Past Surgical History:   Procedure Laterality Date   ??? HX HEENT      goiter/thyroid surgery   ??? HX THYROIDECTOMY           Family History:   Problem Relation Age of Onset   ??? Stroke Sister    ??? Cancer Sister         cervical   ??? Stroke Brother    ??? Diabetes Other    ??? Hypertension Other        Social History     Socioeconomic History   ??? Marital status: SINGLE     Spouse name: Not on file   ??? Number of children: Not on file   ??? Years of education: Not on file   ??? Highest education level: Not on file   Social Needs   ??? Financial resource strain: Not on file   ??? Food insecurity - worry: Not on file   ??? Food insecurity - inability: Not on file   ??? Transportation needs - medical: Not on file   ??? Transportation needs - non-medical: Not on file   Occupational History    ??? Not on file   Tobacco Use   ??? Smoking status: Former Smoker     Last attempt to quit: 1971     Years since quitting: 47.9   ??? Smokeless tobacco: Never Used   ??? Tobacco comment: quit in 1971   Substance and Sexual Activity   ??? Alcohol use: No   ??? Drug use: No   ??? Sexual activity: Not on file   Other Topics Concern   ??? Not on file   Social History Narrative    ** Merged History Encounter **              ALLERGIES: Ativan [lorazepam]; Ativan [lorazepam]; Other medication; and Other medication    Review of Systems   Constitutional: Negative for chills and fever.   Respiratory: Negative for cough and shortness of breath.    Cardiovascular: Positive for chest pain and palpitations. Negative for syncope and near-syncope.   Gastrointestinal: Negative for abdominal pain, diarrhea, nausea and vomiting.   Genitourinary: Negative for dysuria and flank pain.   Musculoskeletal: Negative for back pain and neck pain.   Skin: Negative for color change and rash.   Neurological: Positive for light-headedness and headaches. Negative for dizziness, syncope, weakness and numbness.   All other systems reviewed and are negative.      Vitals:    07/31/17 1704 07/31/17 1705 07/31/17 1713   BP: 155/65  155/65   Pulse:   83   Resp:   18   Temp:   98.1 ??F (36.7 ??C)   SpO2:  97% 98%   Weight:   99.8 kg (220 lb)   Height:   '5\' 2"'  (1.575 m)            Physical Exam   Constitutional: She is oriented to person, place, and time. She appears well-developed and well-nourished. No distress.   HENT:   Head: Normocephalic and atraumatic.   Right Ear: External ear normal.   Left Ear: External ear normal.   Mouth/Throat: Oropharynx is clear and moist. No oropharyngeal exudate.   Eyes: Conjunctivae and EOM are normal. Pupils are equal, round, and reactive to light.   Neck: Normal range of motion. Neck supple.   Cardiovascular: Normal  rate, regular rhythm and intact distal pulses.   No murmur heard.   Pulmonary/Chest: Breath sounds normal. No respiratory distress.   Abdominal: Soft. Bowel sounds are normal. She exhibits no mass. There is no tenderness. There is no rebound and no guarding. No hernia.   Neurological: She is alert and oriented to person, place, and time. Gait normal. GCS eye subscore is 4. GCS verbal subscore is 5. GCS motor subscore is 6.   Nl speech  No drift.  Normal finger-nose testing.  He can hold either leg off the stretcher for 5 seconds.   Skin: Skin is warm and dry.   Psychiatric: She has a normal mood and affect. Her speech is normal.   Nursing note and vitals reviewed.       MDM  Number of Diagnoses or Management Options  Diagnosis management comments: nonfocal neuro exam.  Headache similar ones in the past.  No obvious evidence for stroke or bleeding or meningitis.  Check EKG and troponins running chest pain.  Check orthostatics.  Check for dehydration, anemia, likely than the malleus.  Also screen for infection with chest x-ray and urinalysis.       Amount and/or Complexity of Data Reviewed  Clinical lab tests: ordered and reviewed  Tests in the radiology section of CPT??: ordered and reviewed  Tests in the medicine section of CPT??: ordered and reviewed  Review and summarize past medical records: yes (Cardiac catheter 2016 with very mild disease, less than 20%.)  Independent visualization of images, tracings, or specimens: yes    Risk of Complications, Morbidity, and/or Mortality  Presenting problems: moderate  Diagnostic procedures: low  Management options: moderate           Procedures      Results Include:    Recent Results (from the past 24 hour(s))   CBC WITH AUTOMATED DIFF    Collection Time: 07/31/17  5:29 PM   Result Value Ref Range    WBC 7.0 4.3 - 11.1 K/uL    RBC 4.50 4.05 - 5.2 M/uL    HGB 12.5 11.7 - 15.4 g/dL    HCT 38.6 35.8 - 46.3 %    MCV 85.8 79.6 - 97.8 FL    MCH 27.8 26.1 - 32.9 PG    MCHC 32.4 31.4 - 35.0 g/dL    RDW 13.6 %    PLATELET 163 150 - 450 K/uL     MPV 11.0 9.4 - 12.3 FL    ABSOLUTE NRBC 0.00 0.0 - 0.2 K/uL    DF AUTOMATED      NEUTROPHILS 57 43 - 78 %    LYMPHOCYTES 29 13 - 44 %    MONOCYTES 12 4.0 - 12.0 %    EOSINOPHILS 2 0.5 - 7.8 %    BASOPHILS 0 0.0 - 2.0 %    IMMATURE GRANULOCYTES 0 0.0 - 5.0 %    ABS. NEUTROPHILS 4.0 1.7 - 8.2 K/UL    ABS. LYMPHOCYTES 2.0 0.5 - 4.6 K/UL    ABS. MONOCYTES 0.8 0.1 - 1.3 K/UL    ABS. EOSINOPHILS 0.1 0.0 - 0.8 K/UL    ABS. BASOPHILS 0.0 0.0 - 0.2 K/UL    ABS. IMM. GRANS. 0.0 0.0 - 0.5 K/UL   METABOLIC PANEL, COMPREHENSIVE    Collection Time: 07/31/17  5:29 PM   Result Value Ref Range    Sodium 138 136 - 145 mmol/L    Potassium 3.5 3.5 - 5.1 mmol/L    Chloride 100 98 -  107 mmol/L    CO2 27 21 - 32 mmol/L    Anion gap 11 mmol/L    Glucose 127 (H) 65 - 100 mg/dL    BUN 22 8 - 23 MG/DL    Creatinine 1.70 (H) 0.6 - 1.0 MG/DL    GFR est AA 37 (L) >60 ml/min/1.31m    GFR est non-AA 31 ml/min/1.777m   Calcium 9.1 8.3 - 10.4 MG/DL    Bilirubin, total 0.4 0.2 - 1.1 MG/DL    ALT (SGPT) 19 12 - 65 U/L    AST (SGOT) 18 15 - 37 U/L    Alk. phosphatase 63 50 - 136 U/L    Protein, total 8.3 g/dL    Albumin 3.7 3.2 - 4.6 g/dL    Globulin 4.6 (H) 2.3 - 3.5 g/dL    A-G Ratio 0.8     MAGNESIUM    Collection Time: 07/31/17  5:29 PM   Result Value Ref Range    Magnesium 1.9 1.8 - 2.4 mg/dL   TSH 3RD GENERATION    Collection Time: 07/31/17  5:29 PM   Result Value Ref Range    TSH 1.610 uIU/mL   POC TROPONIN-I    Collection Time: 07/31/17  5:33 PM   Result Value Ref Range    Troponin-I (POC) 0.01 (L) 0.02 - 0.05 ng/ml     Creatinine slightly elevated over last.  Suspect early dehydration from patient's diuretic.  This may account for some fluctuation of blood pressure.  No ectopy seen on cardiac monitor other than occasional PAC.  Chest x-ray pending     Xr Chest Port    Result Date: 07/31/2017  CHEST X-RAY, single portable view  07/31/2017 History: Chest pain. Technique: Single frontal view of the chest. Comparison: Chest x-ray  01/08/2016 Findings: The cardiac silhouette is moderately enlarged and increased in size from the prior study. Increasing central pulmonary vascular engorgement is also seen. The lungs are expanded without evidence for pneumothorax.   Central infiltrates are suggested in the left lung favored to represent pulmonary edema given the additional evolving changes previously described. Overall, these findings are most suggestive of evolving heart failure. No significant pleural effusion is appreciated.     IMPRESSION: 1.  Multiple findings as described above most suggestive of evolving heart failure.     Patient rates when she is resting is in the 50s.  That may contribute to some of her lower blood pressure.  We'll leave message with cardiology to have patient rechecked next week from the standpoint of her blood pressure, heart rate, atypical pain and premature contractions.    And Carafate regarding chest pain as well.  Review of her records reveals patient may be on 2 different diuretics.

## 2017-07-31 NOTE — ED Notes (Signed)
I have reviewed discharge instructions with the patient.  The patient verbalized understanding.    Patient left ED via Discharge Method: wheelchair to Home with family.    Opportunity for questions and clarification provided.       Patient given 2 scripts.         To continue your aftercare when you leave the hospital, you may receive an automated call from our care team to check in on how you are doing.  This is a free service and part of our promise to provide the best care and service to meet your aftercare needs.??? If you have questions, or wish to unsubscribe from this service please call [phone].  Thank you for Choosing our [facility].

## 2017-07-31 NOTE — ED Triage Notes (Signed)
Pt states she has had a headache since yesterday and has been taking BP at home and it has been very irregular with the wrist cuff. States she has had some pressure in her chest since yesterday also. [facility]. Denies slurred speech, one sided weakness or facial droop. States hx of irregular heart beat.

## 2017-08-01 LAB — EKG, 12 LEAD, INITIAL
Atrial Rate: 88 {beats}/min
Calculated P Axis: 63 degrees
Calculated R Axis: 13 degrees
Calculated T Axis: 53 degrees
P-R Interval: 244 ms
Q-T Interval: 400 ms
QRS Duration: 86 ms
QTC Calculation (Bezet): 484 ms
Ventricular Rate: 88 {beats}/min

## 2017-08-01 LAB — BNP: BNP: 233 pg/mL

## 2017-08-01 LAB — EKG 12-LEAD
Atrial Rate: 88 {beats}/min
P Axis: 63 degrees
P-R Interval: 244 ms
Q-T Interval: 400 ms
QRS Duration: 86 ms
QTc Calculation (Bazett): 484 ms
R Axis: 13 degrees
T Axis: 53 degrees
Ventricular Rate: 88 {beats}/min

## 2017-08-01 MED ORDER — BUTALBITAL-ACETAMINOPHEN-CAFFEINE 50 MG-300 MG-40 MG CAPSULE
50-300-40 mg | ORAL_CAPSULE | ORAL | 0 refills | Status: DC | PRN
Start: 2017-08-01 — End: 2017-08-25

## 2017-08-01 MED ORDER — SUCRALFATE 1 GRAM TAB
1 gram | ORAL_TABLET | Freq: Four times a day (QID) | ORAL | 0 refills | Status: DC
Start: 2017-08-01 — End: 2017-08-25

## 2017-08-25 ENCOUNTER — Ambulatory Visit
Admit: 2017-08-25 | Discharge: 2017-08-25 | Payer: PRIVATE HEALTH INSURANCE | Attending: Cardiovascular Disease | Primary: Family Medicine

## 2017-08-25 DIAGNOSIS — R0789 Other chest pain: Secondary | ICD-10-CM

## 2017-08-25 MED ORDER — CARVEDILOL 6.25 MG TAB
6.25 mg | ORAL_TABLET | Freq: Two times a day (BID) | ORAL | 5 refills | Status: DC
Start: 2017-08-25 — End: 2018-02-04

## 2017-08-25 NOTE — Telephone Encounter (Signed)
Pt calling stating that she is having some discomfort in her chest. Pt states that she notices that its when she coughs. Pt also states that her HR and pressure comes and goes. Please Call.

## 2017-08-25 NOTE — Progress Notes (Signed)
[address], [address]  [place], [place] [address]  PHONE: [phone]    Sarah Chavez  1941/07/02      SUBJECTIVE:   Sarah Chavez is a 76 y.o. female seen for a follow up visit regarding the following:     Chief Complaint   Patient presents with   ??? Chest Pain       HPI:    Patient is a 76 year old female returns for follow-up of some episodes of some chest discomfort.  She has some chest discomfort.  Time in 2016 was sent her to catheterization which revealed some mild 20-30% narrowing but no critical stenosis with preserved LV function.  She had some intermittent chest pain before and I recommended doing a nuclear which she has not done.  She showed up in the ER with some shortness of breath and chest discomfort.  Chest x-ray suggested cardiomegaly although difficult to tell with her body habitus.  Enzymes are normal.  Since being home she is done okay with no major swelling at this time.  She is not able to do a lot of walking due to symptoms some back and orthopedic issues        Past Medical History, Past Surgical History, Family history, Social History, and Medications were all reviewed with the patient today and updated as necessary.     Outpatient Medications Marked as Taking for the 08/25/17 encounter (Office Visit) with Maree Krabbe, MD   Medication Sig Dispense Refill   ??? carvedilol (COREG) 6.25 mg tablet Take 1 Tab by mouth two (2) times daily (with meals). 60 Tab 5   ??? lisinopril-hydroCHLOROthiazide (PRINZIDE, ZESTORETIC) 20-12.5 mg per tablet Take 1 Tab by mouth daily. 90 Tab 3   ??? pravastatin (PRAVACHOL) 40 mg tablet Take 1 Tab by mouth daily. 90 Tab 3   ??? famotidine (PEPCID) 20 mg tablet take 1 tablet by mouth once daily 90 Tab 3   ??? ibuprofen (MOTRIN) 800 mg tablet 1 tablet twice a day as needed 60 Tab 3   ??? nitroglycerin (NITROSTAT) 0.4 mg SL tablet Place 1 sl under the tongue q 5 min prn cp, max 3 sl in a 15-min time period. Call 911 if no relief after the 3rd sl. 1 Bottle prn    ??? CHOLECALCIFEROL, VITAMIN D3, (VITAMIN D3 PO) Take  by mouth.     ??? aspirin 81 mg chewable tablet Take 1 Tab by mouth daily. 30 Tab 11     Allergies   Allergen Reactions   ??? Ativan [Lorazepam] Other (comments)     Made her feel crazy and chest pressure   ??? Ativan [Lorazepam] Other (comments)     Chest fullness   ??? Other Medication Palpitations     Some type of decongestant   ??? Other Medication Other (comments)     Doesn't take decongestants, but can't recall why     Past Medical History:   Diagnosis Date   ??? Abdominal pain 07/22/2015   ??? Anemia    ??? Back pain    ??? Bursitis of shoulder    ??? CAD (coronary artery disease)    ??? Cerumen impaction    ??? Chest pain, atypical 07/22/2015   ??? Diabetes (HCC)    ??? Diabetes mellitus out of control (HCC)    ??? Endocrine disease     thyroid   ??? Fatigue    ??? Hip pain, right    ??? Hyperlipidemia, mild    ??? Hypertension    ???  Hypertension associated with diabetes (HCC) 07/22/2015   ??? 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 Left carotid bruit    ??? Lumbago 07/22/2015   ??? Morbid obesity (HCC) 07/22/2015   ??? OSA on CPAP 07/22/2015   ??? Other ill-defined conditions(799.89)     cholesterol   ??? Other ill-defined conditions(799.89)     heart cath 96   ??? Shoulder pain, acute    ??? Shoulder pain, bilateral    ??? Sinusitis, acute maxillary    ??? Unstable angina (HCC) 07/06/2015   ??? Vitamin D deficiency    ??? Weakness      Past Surgical History:   Procedure Laterality Date   ??? HX HEENT      goiter/thyroid surgery   ??? HX THYROIDECTOMY       Family History   Problem Relation Age of Onset   ??? Stroke Sister    ??? Cancer Sister         cervical   ??? Stroke Brother    ??? Diabetes Other    ??? Hypertension Other       Social History     Tobacco Use   ??? Smoking status: Former Smoker     Last attempt to quit: 1971     Years since quitting: 47.9   ??? Smokeless tobacco: Never Used   ??? Tobacco comment: quit in 1971   Substance Use Topics   ??? Alcohol use: No       ROS:    Review of Systems    Constitution: Negative for decreased appetite.   Cardiovascular: Positive for chest pain. Negative for dyspnea on exertion, irregular heartbeat and leg swelling.   Respiratory: Positive for shortness of breath. Negative for cough.          PHYSICAL EXAM:    Visit Vitals  BP 146/60   Pulse 83   Ht 5\' 2"  (1.575 m)   Wt 214 lb (97.1 kg)   BMI 39.14 kg/m??      Wt Readings from Last 3 Encounters:   08/25/17 214 lb (97.1 kg)   07/31/17 220 lb (99.8 kg)   07/06/17 218 lb (98.9 kg)     BP Readings from Last 3 Encounters:   08/25/17 146/60   07/31/17 137/68   07/06/17 118/76         Physical Exam   Constitutional: She appears well-developed and well-nourished.   Cardiovascular: Normal rate and regular rhythm.   No murmur heard.  Pulmonary/Chest: Effort normal.   Abdominal: Soft.   Musculoskeletal: She exhibits edema.   Neurological: She is alert.       Medical problems and test results were reviewed with the patient today.     Results for orders placed or performed in visit on 08/25/17   AMB POC EKG ROUTINE W/ 12 LEADS, INTER & REP    Impression    Normal sinus rhythm at 83.  No acute ST changes.     Lab Results   Component Value Date/Time    Sodium 138 07/31/2017 05:29 PM    Potassium 3.5 07/31/2017 05:29 PM    Chloride 100 07/31/2017 05:29 PM    CO2 27 07/31/2017 05:29 PM    Anion gap 11 07/31/2017 05:29 PM    Glucose 127 (H) 07/31/2017 05:29 PM    BUN 22 07/31/2017 05:29 PM    Creatinine 1.70 (H) 07/31/2017 05:29 PM    BUN/Creatinine ratio 16 06/21/2017 09:21 AM    GFR est AA 37 (  L) 07/31/2017 05:29 PM    GFR est non-AA 31 07/31/2017 05:29 PM    Calcium 9.1 07/31/2017 05:29 PM     Lab Results   Component Value Date/Time    Cholesterol, total 146 06/21/2017 09:21 AM    HDL Cholesterol 58 06/21/2017 09:21 AM    LDL, calculated 76 06/21/2017 09:21 AM    VLDL, calculated 12 06/21/2017 09:21 AM    Triglyceride 58 06/21/2017 09:21 AM    CHOL/HDL Ratio 2.5 07/07/2015 05:15 AM         ASSESSMENT and PLAN     Diagnoses and all orders for this visit:    1. Chest discomfort patient has some atypical chest pain was sent for cath no major stenosis is noted.  We will send her for a Lexiscan nuclear study.  -     AMB POC EKG ROUTINE W/ 12 LEADS, INTER & REP  -     ECHO COMPLETE STUDY; Future  -     MYOCARDIAL SPECT MULTIPLE STUDIES; Future  -     CARDIAC STRESS TST,COMPLETE; Future    2. Coronary artery disease involving native coronary artery of native heart without angina pectoris  -     MYOCARDIAL SPECT MULTIPLE STUDIES; Future  -     CARDIAC STRESS TST,COMPLETE; Future    3. Essential hypertension patient's blood pressures been elevated for look back.  We will repeat the echo BNP was mildly elevated.  We will add Coreg to her regimen  -     ECHO COMPLETE STUDY; Future    4. Controlled type 2 diabetes mellitus without complication, without long-term current use of insulin (HCC) he is to follow-up with her family doctor    5. OSA on CPAP she needs continue her CPAP    6. Dyslipidemia she is to stay on medication    7. Morbid obesity (HCC) encouraged weight loss    8. Elevated brain natriuretic peptide (BNP) level BNP level slightly over 200 in the hospital.  We will repeat her echo looking at LV function to lower her blood pressure further stay on a low-salt diet try to lose weight etc.  -     ECHO COMPLETE STUDY; Future    Other orders  -     carvedilol (COREG) 6.25 mg tablet; Take 1 Tab by mouth two (2) times daily (with meals).             Follow-up Disposition:  Return in about 1 month (around 09/25/2017).Maree KrabbeNed D Hermenia Fritcher, MD  08/25/2017  2:34 PM

## 2017-08-25 NOTE — Telephone Encounter (Signed)
TC from pt requesting to be changed due to c/o cp. Pt states she was seen in the er on 07-31-2017 due to cp. Pt states the cp is getting worse. Hurts worse when she coughs. Triage informed pt that her pain is more than likely muscular. Pt states  She called her pcp's office yesterday and was informed to call our office. Scheduled pt to [facility]N Freeman [facility]00[facility].

## 2017-09-15 ENCOUNTER — Institutional Professional Consult (permissible substitution): Primary: Family Medicine

## 2017-09-15 ENCOUNTER — Institutional Professional Consult (permissible substitution): Admit: 2017-09-15 | Discharge: 2017-09-15 | Payer: MEDICARE | Primary: Family Medicine

## 2017-09-15 DIAGNOSIS — R079 Chest pain, unspecified: Secondary | ICD-10-CM

## 2017-09-15 MED ORDER — REGADENOSON 0.4 MG/5 ML IV SYRINGE
0.4 mg/5 mL | Freq: Once | INTRAVENOUS | Status: AC
Start: 2017-09-15 — End: 2017-09-15
  Administered 2017-09-15: 18:00:00 via INTRAVENOUS

## 2017-09-15 NOTE — Progress Notes (Signed)
[facility]  [address]  [address]  [place] [id number]  [phone]       NUCLEAR PERFUSION STUDY          Date: 09/15/2017      Name: Sarah Chavez  DOB: 06-21-1941  MRN: [id number]  Age: 77 y.o.  Sex: female    Ht Readings from Last 1 Encounters:   08/25/17 5\' 2"  (1.575 m)     Wt Readings from Last 1 Encounters:   08/25/17 214 lb (97.1 kg)       Referring Physician: Maree KrabbeNed D Freeman     Family Physician: Delana MeyerNewman, Steven M, MD    Ordering Physician:  Grafton FolkNed D. Neale BurlyFreeman, M.D.    Test Proctor:  Graylin ShiverLinda Shaw, RN                 TEST INDICATIONS: The patient is here for delineation of extent and severity of coronary artery disease.     TEST INDICATIONS: chest pain, chest pressure/discomfort, lower extremity edema and shortness of breath.    CAD RISK FACTORS: known cardiac disease, Diabetes Mellitus, hypertension, hypercholesterolemia/hyperlipidemia, renal disease, former smoker and Age: Woman > 55., OSA, anemia, thyroid dz, Back problems    OTHER CARDIAC HISTORY: CAD.    INFORMED CONSENT:  The nature of the procedure and its benefits were discussed with the patient.  Risks include, but are not limited to:  Reaction to radiopharmaceutical agent, reaction to the chemical stress agent (if used),  inability to complete the study, arrhythmias, MI and death.  The patient had no further questions and agreed to proceed.     Stress test performed is a one day study with stress and rest performed on the same day.    SPECT IMAGING DOSAGE:  The patient was injected with 9.9 mCi of Cardiolite [id number] intravenously at rest with delayed imaging, then the patient received a bolus of  0.4mg  of Lexiscan (regadenoson) followed within 15 seconds by  30.6 mCi of Cardiolite Tc6025m intraveneously with delayed imaging.    INJECTION SITE: Left arm.    BETA BLOCKER ASSESSMENT:   Patient's last dose of a beta blocker was 1 week ago    INDICATIONS FOR PHARMACOLOGICAL TEST IF NEEDED: inability to exercise  adequately due to conditioning/medical problems and inability to exercise adequately due to orthopedic problems.    TEST PROTOCOL: Lexiscan.    TEST TOLERANCE:The patient received a bolus of Lexiscan 0.4mg  IV.    SYMPTOMS: The patient experienced dyspnea and chest fullness- relieved after Aminophylline 50 mg IV.    TEST TERMINATION: The test was stopped due to projected end-point reached-Lexiscan protocol     STRESS TEST VITALS:     Resting HR =  64 / minute   Resting B/P =  140/80    Peak HR  =  94 / minute   Peak B/P  =  150/82     BASELINE EKG: Sinus rhythm    STRESS EKG: unchanged from previous tracings and normal sinus rhythm      SPECT IMAGE INTERPRETATION    Image Quality: good.    Tomographic views in multiple axes show normal perfusion in all wall segments.    Artifact Suspected: none.    GATED SPECT IMAGE INTERPRETATION:     Left Ventricular Size: normal.  Transient Ischemic Dilatation: not present.  Gated cine images demonstrates reveals normal myocardial thickening and wall motion..    Ejection Fraction: 79%    CONCLUSION:   1. Stress EKG: Normal.  2. SPECT Perfusion Imaging: Normal Perfusion.  3. LV Systolic Function is  normal.   4. Risk Assessment:  Low Risk Scan.    Comments: Routine follow up recommended.     Lenna Gilford, MD  09/16/2017  12:31 PM

## 2017-09-15 NOTE — Progress Notes (Signed)
Called and informed pt of normal nuclear stress test.  Pt voiced understanding.

## 2017-09-28 ENCOUNTER — Institutional Professional Consult (permissible substitution): Primary: Family Medicine

## 2017-09-28 ENCOUNTER — Institutional Professional Consult (permissible substitution): Admit: 2017-09-28 | Discharge: 2017-09-28 | Payer: MEDICARE | Primary: Family Medicine

## 2017-09-28 ENCOUNTER — Ambulatory Visit: Admit: 2017-09-28 | Discharge: 2017-09-28 | Payer: MEDICARE | Attending: Family Medicine | Primary: Family Medicine

## 2017-09-28 DIAGNOSIS — R0789 Other chest pain: Secondary | ICD-10-CM

## 2017-09-28 DIAGNOSIS — Z0000 Encounter for general adult medical examination without abnormal findings: Secondary | ICD-10-CM

## 2017-09-28 LAB — AMB POC COMPLETE CBC,AUTOMATED ENTER
ABS. GRANS (POC): 6 10*3/uL (ref 2.0–7.8)
ABS. LYMPHS (POC): 2.7 10*3/uL (ref 0.6–4.1)
GRANULOCYTES (POC): 61.3 % (ref 37.0–92.0)
HCT (POC): 34.1 % — AB (ref 37.0–51.0)
HGB (POC): 11.4 g/dL — AB (ref 12.0–18.0)
LYMPHOCYTES (POC): 27.3 % (ref 10.0–58.5)
MCH (POC): 28.8 pg (ref 26.0–32.0)
MCHC (POC): 33.4 g/dL (ref 31.0–36.0)
MCV (POC): 86 fL (ref 80.0–97.0)
MID% POC: 11.4 % (ref 0.1–24.0)
MPV (POC): 7.3 fL (ref 0.0–49.9)
Mid # (POC): 1.1 10*3/uL (ref 0.0–1.8)
PLATELET (POC): 303 10*3/uL (ref 140–440)
RBC (POC): 3.96 10*6/uL — AB (ref 4.20–6.30)
RDW (POC): 12.6 % (ref 11.5–14.5)
WBC (POC): 9.8 10*3/uL (ref 4.1–10.9)

## 2017-09-28 LAB — AMB POC URINALYSIS DIP STICK AUTO W/ MICRO
Bilirubin (UA POC): NEGATIVE
Blood (UA POC): NEGATIVE
Glucose (UA POC): NEGATIVE
Ketones (UA POC): NEGATIVE
Leukocyte esterase (UA POC): NEGATIVE
Nitrites (UA POC): NEGATIVE
Protein (UA POC): NEGATIVE
Specific gravity (UA POC): 1.015 (ref 1.001–1.035)
Urobilinogen (UA POC): 0.2 (ref 0.2–1)
pH (UA POC): 7 (ref 4.6–8.0)

## 2017-09-28 LAB — AMB POC URINE, MICROALBUMIN, SEMIQUANTITATIVE: Microalbumin urine (POC): 50 MG/L

## 2017-09-28 MED ORDER — MELOXICAM 7.5 MG TAB
7.5 mg | ORAL_TABLET | Freq: Every day | ORAL | 2 refills | Status: DC
Start: 2017-09-28 — End: 2017-10-06

## 2017-09-28 NOTE — ACP (Advance Care Planning) (Signed)
Paperwork given to pt on today. She will complete and return the paperwork at her next scheduled visit.

## 2017-09-28 NOTE — Progress Notes (Signed)
SUBJECTIVE:   Sarah Chavez is a 77 y.o. female here for subsequent Medicare wellness exam.  Patient has a past medical history significant for hypertension, diabetes, CAD, high cholesterol, DJD, obesity, vitamin D deficiency, allergic rhinitis, lower extremity edema and renal insufficiency.  Current medications listed in the EMR reports that her arthritic complaints continue to be problematic.  She would like to try an anti-inflammatory.  She reports that she is been having some atypical chest pain is in the process of being worked up by her cardiologist.  She reports no orthopnea or PND.  Her current medications listed in the EMR and reviewed today.  She had a mild headache with drainage and congestion for about 5 days.  No fever or body aches are reported.  The drainage is clear.    HPI  See above    Past Medical History, Past Surgical History, Family history, Social History, and Medications were all reviewed with the patient today and updated as necessary.       Current Outpatient Medications   Medication Sig Dispense Refill   ??? meloxicam (MOBIC) 7.5 mg tablet Take 1 Tab by mouth daily. 30 Tab 2   ??? carvedilol (COREG) 6.25 mg tablet Take 1 Tab by mouth two (2) times daily (with meals). 60 Tab 5   ??? lisinopril-hydroCHLOROthiazide (PRINZIDE, ZESTORETIC) 20-12.5 mg per tablet Take 1 Tab by mouth daily. 90 Tab 3   ??? pravastatin (PRAVACHOL) 40 mg tablet Take 1 Tab by mouth daily. 90 Tab 3   ??? famotidine (PEPCID) 20 mg tablet take 1 tablet by mouth once daily 90 Tab 3   ??? nitroglycerin (NITROSTAT) 0.4 mg SL tablet Place 1 sl under the tongue q 5 min prn cp, max 3 sl in a 15-min time period. Call 911 if no relief after the 3rd sl. 1 Bottle prn   ??? CHOLECALCIFEROL, VITAMIN D3, (VITAMIN D3 PO) Take  by mouth.     ??? aspirin 81 mg chewable tablet Take 1 Tab by mouth daily. 30 Tab 11     Allergies   Allergen Reactions   ??? Ativan [Lorazepam] Other (comments)     Made her feel crazy and chest pressure    ??? Ativan [Lorazepam] Other (comments)     Chest fullness   ??? Other Medication Palpitations     Some type of decongestant   ??? Other Medication Other (comments)     Doesn't take decongestants, but can't recall why     Patient Active Problem List   Diagnosis Code   ??? HTN (hypertension) I10   ??? Diabetes mellitus type 2, controlled (HCC) E11.9   ??? Hip pain M25.559   ??? Unstable angina (HCC) I20.0   ??? Coronary artery disease involving native coronary artery of native heart without angina pectoris I25.10   ??? Dyslipidemia E78.5   ??? Morbid obesity (HCC) E66.01   ??? OSA on CPAP G47.33, Z99.89   ??? 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 Elevated brain natriuretic peptide (BNP) level R79.89     Past Medical History:   Diagnosis Date   ??? Abdominal pain 07/22/2015   ??? Anemia    ??? Back pain    ??? Bursitis of shoulder    ??? CAD (coronary artery disease)    ??? Cerumen impaction    ??? Chest pain, atypical 07/22/2015   ??? Diabetes (HCC)    ??? Diabetes mellitus out of control (HCC)    ??? Endocrine disease  thyroid   ??? Fatigue    ??? Hip pain, right    ??? Hyperlipidemia, mild    ??? Hypertension    ??? Hypertension associated with diabetes (HCC) 07/22/2015   ??? 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 Left carotid bruit    ??? Lumbago 07/22/2015   ??? Morbid obesity (HCC) 07/22/2015   ??? OSA on CPAP 07/22/2015   ??? Other ill-defined conditions(799.89)     cholesterol   ??? Other ill-defined conditions(799.89)     heart cath 96   ??? Shoulder pain, acute    ??? Shoulder pain, bilateral    ??? Sinusitis, acute maxillary    ??? Unstable angina (HCC) 07/06/2015   ??? Vitamin D deficiency    ??? Weakness      Past Surgical History:   Procedure Laterality Date   ??? HX HEENT      goiter/thyroid surgery   ??? HX THYROIDECTOMY       Family History   Problem Relation Age of Onset   ??? Stroke Sister    ??? Cancer Sister         cervical   ??? Stroke Brother    ??? Diabetes Other    ??? Hypertension Other      Social History     Tobacco Use    ??? Smoking status: Former Smoker     Last attempt to quit: 1971     Years since quitting: 48.0   ??? Smokeless tobacco: Never Used   ??? Tobacco comment: quit in 1971   Substance Use Topics   ??? Alcohol use: No         Review of Systems  See above    OBJECTIVE:  Visit Vitals  BP 122/64 (BP 1 Location: Left arm, BP Patient Position: Sitting)   Ht 5\' 2"  (1.575 m)   Wt 215 lb (97.5 kg)   BMI 39.32 kg/m??        Physical Exam   Constitutional: She is oriented to person, place, and time. She appears well-developed and well-nourished.   HENT:   Head: Normocephalic and atraumatic.   Right Ear: External ear normal.   Left Ear: External ear normal.   Nose: Nose normal.   Mouth/Throat: Oropharynx is clear and moist. No oropharyngeal exudate.   Eyes: EOM are normal. Pupils are equal, round, and reactive to light. Right eye exhibits no discharge. Left eye exhibits no discharge. No scleral icterus.   Neck: Normal range of motion. Neck supple. No JVD present. No tracheal deviation present. No thyromegaly present.   Cardiovascular: Normal rate, regular rhythm, normal heart sounds and intact distal pulses. Exam reveals no gallop and no friction rub.   No murmur heard.  Pulmonary/Chest: Effort normal and breath sounds normal. No respiratory distress. She has no wheezes. She has no rales. She exhibits no tenderness.   Abdominal: Soft. Bowel sounds are normal. She exhibits no distension and no mass. There is no tenderness. There is no rebound and no guarding.   Musculoskeletal: Normal range of motion. She exhibits no edema or tenderness.   Lymphadenopathy:     She has no cervical adenopathy.   Neurological: She is alert and oriented to person, place, and time. She has normal reflexes. No cranial nerve deficit. She exhibits normal muscle tone. Coordination normal.   Skin: Skin is warm and dry. No rash noted. No erythema.   Psychiatric: She has a normal mood and affect. Her behavior is normal. Judgment and thought content normal.  Medical problems and test results were reviewed with the patient today.         ASSESSMENT and PLAN    1.  Hypertension.  Blood pressures well maintained.  Check metabolic panel, TSH, CBC and UA.    2.  Diabetes.  Check hemoglobin A1c and urine microalbumin.  Ophthalmologic exams are up-to-date.  Encouraged daily feet exams.    3.  CAD.  Per cardiology.    4.  High cholesterol.  Check lipid panel.    5.  DJD.  As needed Mobic.    6.  Obesity.  Dietary management discussed.  BMI 39.3.    7.  Vitamin D deficiency.  Check vitamin D level.    8.  Mild headache.  Underlying eustachian tube dysfunction.  Over-the-counter plain Mucinex as needed.  Return if symptoms persist or worsen.    9.  Allergic rhinitis.  As above.    10.  Lower extremity edema.  No complaints.    11.  Renal insufficiency.  Checking renal function today.  Monitor closely with the addition of anti-inflammatory.  Last creatinine was 1.7.  We will see how she responds to the Mobic and then ultimately try to transition her off of it to be used only as needed for breakthrough significant discomfort.

## 2017-09-28 NOTE — Progress Notes (Signed)
Echo completed. See interpretation scanned to the order.

## 2017-09-28 NOTE — Patient Instructions (Signed)
Medicare Wellness Visit, Female     The best way to live healthy is to have a lifestyle where you eat a well-balanced diet, exercise regularly, limit alcohol use, and quit all forms of tobacco/nicotine, if applicable.   Regular preventive services are another way to keep healthy. Preventive services (vaccines, screening tests, monitoring & exams) can help personalize your care plan, which helps you manage your own care. Screening tests can find health problems at the earliest stages, when they are easiest to treat.   [facility] follows the current, evidence-based guidelines published by the [place] [employer] (USPSTF) when recommending preventive services for our patients. Because we follow these guidelines, sometimes recommendations change over time as research supports it. (For example, mammograms used to be recommended annually. Even though Medicare will still pay for an annual mammogram, the newer guidelines recommend a mammogram every two years for women of average risk.)  Of course, you and your doctor may decide to screen more often for some diseases, based on your risk and your health status.   Preventive services for you include:  - Medicare offers their members a free annual wellness visit, which is time for you and your primary care provider to discuss and plan for your preventive service needs. Take advantage of this benefit every year!  -All adults over the age of 53 should receive the recommended pneumonia vaccines. Current USPSTF guidelines recommend a series of two vaccines for the best pneumonia protection.   -All adults should have a flu vaccine yearly and a tetanus vaccine every 10 years. All adults age 5 and older should receive a shingles vaccine once in their lifetime.    -A bone mass density test is recommended when a woman turns 65 to screen for osteoporosis. This test is only recommended one time, as a screening.  Some providers will use this same test as a disease monitoring tool if you already have osteoporosis.  -All adults age 64-70 who are overweight should have a diabetes screening test once every three years.   -Other screening tests and preventive services for persons with diabetes include: an eye exam to screen for diabetic retinopathy, a kidney function test, a foot exam, and stricter control over your cholesterol.   -Cardiovascular screening for adults with routine risk involves an electrocardiogram (ECG) at intervals determined by your doctor.   -Colorectal cancer screenings should be done for adults age 32-75 with no increased risk factors for colorectal cancer.  There are a number of acceptable methods of screening for this type of cancer. Each test has its own benefits and drawbacks. Discuss with your doctor what is most appropriate for you during your annual wellness visit. The different tests include: colonoscopy (considered the best screening method), a fecal occult blood test, a fecal DNA test, and sigmoidoscopy.  -Breast cancer screenings are recommended every other year for women of normal risk, age 69-74.  -Cervical cancer screenings for women over age 70 are only recommended with certain risk factors.   -All adults born between 50 and 1965 should be screened once for Hepatitis C.     Here is a list of your current Health Maintenance items (your personalized list of preventive services) with a due date:  Health Maintenance Due   Topic Date Due   ??? Diabetic Foot Care  12/04/1948   ??? DTaP/Tdap/Td  (1 - Tdap) 12/05/1959   ??? Shingles Vaccine (1 of 2) 12/04/1988   ??? Flu Vaccine  04/07/2017   ???  Glaucoma Screening   04/09/2017   ??? Eye Exam  04/09/2017   ??? Albumin Urine Test  09/21/2017

## 2017-09-28 NOTE — Progress Notes (Signed)
It is noted that pt had a F/U apt., Jan 30, w/Dr Archie BalboaN freeman. Results reviewed.

## 2017-09-28 NOTE — ACP (Advance Care Planning) (Signed)
Advanced care planning discussed with the patient including the importance of living will and power of attorney.  Patient reports both documents need to be created and will discuss with family members to get completed.  Patient understands the importance of these documents.

## 2017-09-28 NOTE — ACP (Advance Care Planning) (Signed)
Paperwork given to pt on today. She will complete and return the paperwork at her next scheduled visit.

## 2017-09-28 NOTE — Progress Notes (Signed)
This is the Subsequent Medicare Annual Wellness Exam, performed 12 months or more after the Initial AWV or the last Subsequent AWV    I have reviewed the patient's medical history in detail and updated the computerized patient record.     History     Past Medical History:   Diagnosis Date   ??? Abdominal pain 07/22/2015   ??? Anemia    ??? Back pain    ??? Bursitis of shoulder    ??? CAD (coronary artery disease)    ??? Cerumen impaction    ??? Chest pain, atypical 07/22/2015   ??? Diabetes (HCC)    ??? Diabetes mellitus out of control (HCC)    ??? Endocrine disease     thyroid   ??? Fatigue    ??? Hip pain, right    ??? Hyperlipidemia, mild    ??? Hypertension    ??? Hypertension associated with diabetes (HCC) 07/22/2015   ??? 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 Left carotid bruit    ??? Lumbago 07/22/2015   ??? Morbid obesity (HCC) 07/22/2015   ??? OSA on CPAP 07/22/2015   ??? Other ill-defined conditions(799.89)     cholesterol   ??? Other ill-defined conditions(799.89)     heart cath 96   ??? Shoulder pain, acute    ??? Shoulder pain, bilateral    ??? Sinusitis, acute maxillary    ??? Unstable angina (HCC) 07/06/2015   ??? Vitamin D deficiency    ??? Weakness       Past Surgical History:   Procedure Laterality Date   ??? HX HEENT      goiter/thyroid surgery   ??? HX THYROIDECTOMY       Current Outpatient Medications   Medication Sig Dispense Refill   ??? carvedilol (COREG) 6.25 mg tablet Take 1 Tab by mouth two (2) times daily (with meals). 60 Tab 5   ??? lisinopril-hydroCHLOROthiazide (PRINZIDE, ZESTORETIC) 20-12.5 mg per tablet Take 1 Tab by mouth daily. 90 Tab 3   ??? pravastatin (PRAVACHOL) 40 mg tablet Take 1 Tab by mouth daily. 90 Tab 3   ??? famotidine (PEPCID) 20 mg tablet take 1 tablet by mouth once daily 90 Tab 3   ??? ibuprofen (MOTRIN) 800 mg tablet 1 tablet twice a day as needed 60 Tab 3   ??? nitroglycerin (NITROSTAT) 0.4 mg SL tablet Place 1 sl under the tongue q 5 min prn cp, max 3 sl in a 15-min time period. Call 911 if no relief  after the 3rd sl. 1 Bottle prn   ??? CHOLECALCIFEROL, VITAMIN D3, (VITAMIN D3 PO) Take  by mouth.     ??? aspirin 81 mg chewable tablet Take 1 Tab by mouth daily. 30 Tab 11     Allergies   Allergen Reactions   ??? Ativan [Lorazepam] Other (comments)     Made her feel crazy and chest pressure   ??? Ativan [Lorazepam] Other (comments)     Chest fullness   ??? Other Medication Palpitations     Some type of decongestant   ??? Other Medication Other (comments)     Doesn't take decongestants, but can't recall why     Family History   Problem Relation Age of Onset   ??? Stroke Sister    ??? Cancer Sister         cervical   ??? Stroke Brother    ??? Diabetes Other    ??? Hypertension Other      Social History  Tobacco Use   ??? Smoking status: Former Smoker     Last attempt to quit: 1971     Years since quitting: 48.0   ??? Smokeless tobacco: Never Used   ??? Tobacco comment: quit in 1971   Substance Use Topics   ??? Alcohol use: No     Patient Active Problem List   Diagnosis Code   ??? HTN (hypertension) I10   ??? Diabetes mellitus type 2, controlled (HCC) E11.9   ??? Hip pain M25.559   ??? Unstable angina (HCC) I20.0   ??? Coronary artery disease involving native coronary artery of native heart without angina pectoris I25.10   ??? Dyslipidemia E78.5   ??? Morbid obesity (HCC) E66.01   ??? OSA on CPAP G47.33, Z99.89   ??? 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 Elevated brain natriuretic peptide (BNP) level R79.89       Depression Risk Factor Screening:     PHQ over the last two weeks 09/28/2017   Little interest or pleasure in doing things Not at all   Feeling down, depressed, irritable, or hopeless Not at all   Total Score PHQ 2 0     Alcohol Risk Factor Screening:   You do not drink alcohol or very rarely.    Functional Ability and Level of Safety:   Hearing Loss  Hearing is good.    Activities of Daily Living  The home contains: no safety equipment.  Patient does total self care    Fall Risk  Fall Risk Assessment, last 12 mths 09/28/2017    Able to walk? Yes   Fall in past 12 months? No       Abuse Screen  Patient is not abused    Cognitive Screening   Evaluation of Cognitive Function:  Has your family/caregiver stated any concerns about your memory: no  Normal    Patient Care Team   Patient Care Team:  Delana MeyerNewman, Johnattan Strassman M, MD as PCP - General (Family Practice)  Maree KrabbeFreeman, Ned D, MD (Cardiology)    Assessment/Plan   Education and counseling provided:  Are appropriate based on today's review and evaluation  End-of-Life planning (with patient's consent)    Diagnoses and all orders for this visit:    1. Medicare annual wellness visit, subsequent    Patient is here for Medicare wellness exam.  Anticipatory guidance discussed including the importance of sunscreen use, helmet use and seatbelt use.  No depressive symptoms are reported.  Risk for falls is minimal.  Cognitive functioning appears to be intact.  Pneumococcal vaccines including Prevnar 13 and Pneumovax 23 are up-to-date.  Screening colonoscopy is up-to-date.  Advanced care planning discussed with the patient and documented in a separate note.      Health Maintenance Due   Topic Date Due   ??? FOOT EXAM Q1  [birth year]/04/1950   ??? DTaP/Tdap/Td series (1 - Tdap) 12/05/1959   ??? Shingrix Vaccine Age 90> (1 of 2) 12/04/1988   ??? Influenza Age 379 to Adult  04/07/2017   ??? GLAUCOMA SCREENING Q2Y  04/09/2017   ??? EYE EXAM RETINAL OR DILATED  04/09/2017   ??? MICROALBUMIN Q1  09/21/2017

## 2017-09-29 LAB — VITAMIN D, 25 HYDROXY: VITAMIN D, 25-HYDROXY: 17.8 ng/mL — ABNORMAL LOW (ref 30.0–100.0)

## 2017-09-29 LAB — METABOLIC PANEL, COMPREHENSIVE
A-G Ratio: 1 — ABNORMAL LOW (ref 1.2–2.2)
ALT (SGPT): 12 IU/L (ref 0–32)
AST (SGOT): 13 IU/L (ref 0–40)
Albumin: 4 g/dL (ref 3.5–4.8)
Alk. phosphatase: 62 IU/L (ref 39–117)
BUN/Creatinine ratio: 16 (ref 12–28)
BUN: 17 mg/dL (ref 8–27)
Bilirubin, total: 0.5 mg/dL (ref 0.0–1.2)
CO2: 26 mmol/L (ref 20–29)
Calcium: 8.9 mg/dL (ref 8.7–10.3)
Chloride: 101 mmol/L (ref 96–106)
Creatinine: 1.05 mg/dL — ABNORMAL HIGH (ref 0.57–1.00)
GFR est AA: 59 mL/min/{1.73_m2} — ABNORMAL LOW (ref >59)
GFR est non-AA: 51 mL/min/{1.73_m2} — ABNORMAL LOW (ref >59)
GLOBULIN, TOTAL: 3.9 g/dL (ref 1.5–4.5)
Glucose: 118 mg/dL — ABNORMAL HIGH (ref 65–99)
Potassium: 4 mmol/L (ref 3.5–5.2)
Protein, total: 7.9 g/dL (ref 6.0–8.5)
Sodium: 141 mmol/L (ref 134–144)

## 2017-09-29 LAB — LIPID PANEL
Cholesterol, total: 148 mg/dL (ref 100–199)
HDL Cholesterol: 53 mg/dL (ref >39)
LDL, calculated: 83 mg/dL (ref 0–99)
Triglyceride: 61 mg/dL (ref 0–149)
VLDL, calculated: 12 mg/dL (ref 5–40)

## 2017-09-29 LAB — HEMOGLOBIN A1C WITH EAG
Estimated average glucose: 143 mg/dL
Hemoglobin A1c: 6.6 % — ABNORMAL HIGH (ref 4.8–5.6)

## 2017-09-29 LAB — TSH 3RD GENERATION: TSH: 0.989 u[IU]/mL (ref 0.450–4.500)

## 2017-10-06 ENCOUNTER — Ambulatory Visit
Admit: 2017-10-06 | Discharge: 2017-10-06 | Payer: MEDICARE | Attending: Cardiovascular Disease | Primary: Family Medicine

## 2017-10-06 DIAGNOSIS — I10 Essential (primary) hypertension: Secondary | ICD-10-CM

## 2017-10-06 MED ORDER — PRAVASTATIN 80 MG TAB
80 mg | ORAL_TABLET | Freq: Every evening | ORAL | 5 refills | Status: DC
Start: 2017-10-06 — End: 2018-02-04

## 2017-10-06 NOTE — Progress Notes (Signed)
[address], [address]  [place], [place] [address]  PHONE: [phone]    Sarah Chavez  1940-12-27      SUBJECTIVE:   Sarah Chavez is a 77 y.o. female seen for a follow up visit regarding the following:     Chief Complaint   Patient presents with   ??? Results     Echo/Nuke       HPI:    77 year-old female comes back for follow-up of her echo nuclear study.  She has some atypical sounding chest pains.  In 2016 a cardiac catheterization revealed normal LV function with only some minimal irregularities.  We repeated the scans.        Past Medical History, Past Surgical History, Family history, Social History, and Medications were all reviewed with the patient today and updated as necessary.     Outpatient Medications Marked as Taking for the 10/06/17 encounter (Office Visit) with Maree Krabbe, MD   Medication Sig Dispense Refill   ??? pravastatin (PRAVACHOL) 80 mg tablet Take 1 Tab by mouth nightly. 30 Tab 5   ??? carvedilol (COREG) 6.25 mg tablet Take 1 Tab by mouth two (2) times daily (with meals). 60 Tab 5   ??? lisinopril-hydroCHLOROthiazide (PRINZIDE, ZESTORETIC) 20-12.5 mg per tablet Take 1 Tab by mouth daily. 90 Tab 3   ??? famotidine (PEPCID) 20 mg tablet take 1 tablet by mouth once daily 90 Tab 3   ??? nitroglycerin (NITROSTAT) 0.4 mg SL tablet Place 1 sl under the tongue q 5 min prn cp, max 3 sl in a 15-min time period. Call 911 if no relief after the 3rd sl. 1 Bottle prn   ??? CHOLECALCIFEROL, VITAMIN D3, (VITAMIN D3 PO) Take  by mouth.     ??? aspirin 81 mg chewable tablet Take 1 Tab by mouth daily. 30 Tab 11     Allergies   Allergen Reactions   ??? Ativan [Lorazepam] Other (comments)     Made her feel crazy and chest pressure   ??? Ativan [Lorazepam] Other (comments)     Chest fullness   ??? Other Medication Palpitations     Some type of decongestant   ??? Other Medication Other (comments)     Doesn't take decongestants, but can't recall why     Past Medical History:   Diagnosis Date    ??? Abdominal pain 07/22/2015   ??? Anemia    ??? Back pain    ??? Bursitis of shoulder    ??? CAD (coronary artery disease)    ??? Cerumen impaction    ??? Chest pain, atypical 07/22/2015   ??? Diabetes (HCC)    ??? Diabetes mellitus out of control (HCC)    ??? Endocrine disease     thyroid   ??? Fatigue    ??? Hip pain, right    ??? Hyperlipidemia, mild    ??? Hypertension    ??? Hypertension associated with diabetes (HCC) 07/22/2015   ??? 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 Left carotid bruit    ??? Lumbago 07/22/2015   ??? Morbid obesity (HCC) 07/22/2015   ??? OSA on CPAP 07/22/2015   ??? Other ill-defined conditions(799.89)     cholesterol   ??? Other ill-defined conditions(799.89)     heart cath 96   ??? Shoulder pain, acute    ??? Shoulder pain, bilateral    ??? Sinusitis, acute maxillary    ??? Unstable angina (HCC) 07/06/2015   ??? Vitamin D deficiency    ???  Weakness      Past Surgical History:   Procedure Laterality Date   ??? HX HEENT      goiter/thyroid surgery   ??? HX THYROIDECTOMY       Family History   Problem Relation Age of Onset   ??? Stroke Sister    ??? Cancer Sister         cervical   ??? Stroke Brother    ??? Diabetes Other    ??? Hypertension Other       Social History     Tobacco Use   ??? Smoking status: Former Smoker     Last attempt to quit: 1971     Years since quitting: 48.1   ??? Smokeless tobacco: Never Used   ??? Tobacco comment: quit in 1971   Substance Use Topics   ??? Alcohol use: No       ROS:    Review of Systems   Cardiovascular: Negative for chest pain and dyspnea on exertion.         PHYSICAL EXAM:    Visit Vitals  BP 158/68   Pulse 90   Ht 5\' 2"  (1.575 m)   Wt 216 lb 9 oz (98.2 kg)   BMI 39.61 kg/m??      Wt Readings from Last 3 Encounters:   10/06/17 216 lb 9 oz (98.2 kg)   09/28/17 215 lb (97.5 kg)   08/25/17 214 lb (97.1 kg)     BP Readings from Last 3 Encounters:   10/06/17 158/68   09/28/17 122/64   08/25/17 146/60         Physical Exam   Constitutional: She appears well-developed.    Cardiovascular: Normal rate and regular rhythm.       Medical problems and test results were reviewed with the patient today.     No results found for any visits on 10/06/17.  Lab Results   Component Value Date/Time    Sodium 141 09/28/2017 10:32 AM    Potassium 4.0 09/28/2017 10:32 AM    Chloride 101 09/28/2017 10:32 AM    CO2 26 09/28/2017 10:32 AM    Anion gap 11 07/31/2017 05:29 PM    Glucose 118 (H) 09/28/2017 10:32 AM    BUN 17 09/28/2017 10:32 AM    Creatinine 1.05 (H) 09/28/2017 10:32 AM    BUN/Creatinine ratio 16 09/28/2017 10:32 AM    GFR est AA 59 (L) 09/28/2017 10:32 AM    GFR est non-AA 51 (L) 09/28/2017 10:32 AM    Calcium 8.9 09/28/2017 10:32 AM     Lab Results   Component Value Date/Time    Cholesterol, total 148 09/28/2017 10:32 AM    HDL Cholesterol 53 09/28/2017 10:32 AM    LDL, calculated 83 09/28/2017 10:32 AM    VLDL, calculated 12 09/28/2017 10:32 AM    Triglyceride 61 09/28/2017 10:32 AM    CHOL/HDL Ratio 2.5 07/07/2015 05:15 AM   Echo shows mild LVH with normal LV function.  Ejection fraction is hyperdynamic.  No major valve disease noted.  Nuclear study showed no ischemia      ASSESSMENT and PLAN    Diagnoses and all orders for this visit:    1. Essential hypertension blood pressure a bit high today but she says is not as high as at home.  She can get her new cuff.  We will continue blood pressure medications.  If it stays high we may need and Norvasc.  She has minimal LVH but normal  systolic function on echo  2. Coronary artery disease involving native coronary artery of native heart without angina pectoris no suggestion of ischemia.  LITTLE over 2 years ago there was no major obstructions.  We will continue medical treatment    3. Stage 3 chronic kidney disease (HCC) followed by her family doctor    4. Morbid obesity (HCC) encourage more weight loss.    Other orders  -     pravastatin (PRAVACHOL) 80 mg tablet; Take 1 Tab by mouth nightly.             Follow-up Disposition:   Return in about 4 months (around 02/03/2018).Maree Krabbe, MD  10/06/2017  2:17 PM

## 2017-11-09 ENCOUNTER — Ambulatory Visit: Admit: 2017-11-09 | Discharge: 2017-11-09 | Payer: MEDICARE | Attending: Family Medicine | Primary: Family Medicine

## 2017-11-09 DIAGNOSIS — E559 Vitamin D deficiency, unspecified: Secondary | ICD-10-CM

## 2017-11-09 NOTE — Progress Notes (Signed)
SUBJECTIVE:   Sarah Chavez is a 77 y.o. female who has a past medical history significant for diabetes, hypertension, high cholesterol, CAD, lower extremity edema, vitamin D deficiency and severe obesity.  Review of systems reveals no complaints of chest pain, shortness of breath, orthopnea or PND.  GI and GU review of systems is unremarkable reports that she has been sporadically compliant with her vitamin D supplements.  Otherwise she reports compliance with her medicines which are listed in the EMR and reviewed today.    HPI  See above    Past Medical History, Past Surgical History, Family history, Social History, and Medications were all reviewed with the patient today and updated as necessary.       Current Outpatient Medications   Medication Sig Dispense Refill   ??? pravastatin (PRAVACHOL) 80 mg tablet Take 1 Tab by mouth nightly. 30 Tab 5   ??? carvedilol (COREG) 6.25 mg tablet Take 1 Tab by mouth two (2) times daily (with meals). 60 Tab 5   ??? lisinopril-hydroCHLOROthiazide (PRINZIDE, ZESTORETIC) 20-12.5 mg per tablet Take 1 Tab by mouth daily. 90 Tab 3   ??? famotidine (PEPCID) 20 mg tablet take 1 tablet by mouth once daily 90 Tab 3   ??? nitroglycerin (NITROSTAT) 0.4 mg SL tablet Place 1 sl under the tongue q 5 min prn cp, max 3 sl in a 15-min time period. Call 911 if no relief after the 3rd sl. 1 Bottle prn   ??? aspirin 81 mg chewable tablet Take 1 Tab by mouth daily. 30 Tab 11   ??? meloxicam (MOBIC) 7.5 mg tablet take 1 tablet by mouth once daily  0   ??? CHOLECALCIFEROL, VITAMIN D3, (VITAMIN D3 PO) Take  by mouth.       Allergies   Allergen Reactions   ??? Ativan [Lorazepam] Other (comments)     Made her feel crazy and chest pressure   ??? Ativan [Lorazepam] Other (comments)     Chest fullness   ??? Other Medication Palpitations     Some type of decongestant   ??? Other Medication Other (comments)     Doesn't take decongestants, but can't recall why     Patient Active Problem List   Diagnosis Code    ??? HTN (hypertension) I10   ??? Diabetes mellitus type 2, controlled (HCC) E11.9   ??? Coronary artery disease involving native coronary artery of native heart without angina pectoris I25.10   ??? Dyslipidemia E78.5   ??? OSA on CPAP G47.33, Z99.89   ??? Severe obesity (HCC) E66.01     Past Medical History:   Diagnosis Date   ??? Abdominal pain 07/22/2015   ??? Anemia    ??? Back pain    ??? Bursitis of shoulder    ??? CAD (coronary artery disease)    ??? Cerumen impaction    ??? Chest pain, atypical 07/22/2015   ??? Diabetes (HCC)    ??? Diabetes mellitus out of control (HCC)    ??? Endocrine disease     thyroid   ??? Fatigue    ??? Hip pain, right    ??? Hyperlipidemia, mild    ??? Hypertension    ??? Hypertension associated with diabetes (HCC) 07/22/2015   ??? 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 Left carotid bruit    ??? Lumbago 07/22/2015   ??? Morbid obesity (HCC) 07/22/2015   ??? OSA on CPAP 07/22/2015   ??? Other ill-defined conditions(799.89)     cholesterol   ???  Other ill-defined conditions(799.89)     heart cath 96   ??? Shoulder pain, acute    ??? Shoulder pain, bilateral    ??? Sinusitis, acute maxillary    ??? Unstable angina (HCC) 07/06/2015   ??? Vitamin D deficiency    ??? Weakness      Past Surgical History:   Procedure Laterality Date   ??? HX HEENT      goiter/thyroid surgery   ??? HX THYROIDECTOMY       Family History   Problem Relation Age of Onset   ??? Stroke Sister    ??? Cancer Sister         cervical   ??? Stroke Brother    ??? Diabetes Other    ??? Hypertension Other      Social History     Tobacco Use   ??? Smoking status: Former Smoker     Last attempt to quit: 1971     Years since quitting: 48.2   ??? Smokeless tobacco: Never Used   ??? Tobacco comment: quit in 1971   Substance Use Topics   ??? Alcohol use: No         Review of Systems  See above    OBJECTIVE:  Visit Vitals  BP 126/74 (BP 1 Location: Left arm, BP Patient Position: Sitting)   Ht 5\' 2"  (1.575 m)   Wt 218 lb 3.2 oz (99 kg)   BMI 39.91 kg/m??        Physical Exam    Constitutional: She is oriented to person, place, and time. She appears well-developed and well-nourished.   HENT:   Head: Normocephalic and atraumatic.   Eyes: EOM are normal. Pupils are equal, round, and reactive to light.   Neck: Normal range of motion. Neck supple. No JVD present. No thyromegaly present.   Cardiovascular: Normal rate, regular rhythm and normal heart sounds. Exam reveals no gallop and no friction rub.   No murmur heard.  Pulmonary/Chest: Effort normal. No respiratory distress. She has no wheezes. She has no rales.   Abdominal: Soft. There is no tenderness. There is no rebound and no guarding.   Musculoskeletal: Normal range of motion. She exhibits no edema.   Neurological: She is alert and oriented to person, place, and time.   Skin: No rash noted.   Psychiatric: She has a normal mood and affect.       Medical problems and test results were reviewed with the patient today.         ASSESSMENT and PLAN    1.  Vitamin D deficiency.  Vitamin D level 17.  Previously 21.  Start vitamin D supplements as directed.    2.  Diabetes.  A1c is 6.6.  Previously 6.3.  Urine microalbumin is 50 mg/L.  Encourage yearly retinal exams and daily feet exams.    3.  Hypertension.  Blood pressure is well maintained.  Continue current therapy.    4.  High cholesterol.  LDL is 83.  Not at goal.  Continue current therapy with statin therapy and emphasis on improving dietary management.    5.  CAD.  No chest pain reported.    6.  Lower extremity edema.  No complaints.    7.  Severe obesity.  BMI is 39.9.  Dietary counseling provided.

## 2017-12-03 ENCOUNTER — Ambulatory Visit: Admit: 2017-12-03 | Discharge: 2017-12-03 | Payer: MEDICARE | Attending: Family Medicine | Primary: Family Medicine

## 2017-12-03 DIAGNOSIS — R109 Unspecified abdominal pain: Secondary | ICD-10-CM

## 2017-12-03 LAB — AMB POC COMPLETE CBC,AUTOMATED ENTER
ABS. GRANS (POC): 6.8 10*3/uL (ref 2.0–7.8)
ABS. LYMPHS (POC): 1.8 10*3/uL (ref 0.6–4.1)
GRANULOCYTES (POC): 73.1 % (ref 37.0–92.0)
HCT (POC): 35.8 % — AB (ref 37.0–51.0)
HGB (POC): 11.4 g/dL — AB (ref 12.0–18.0)
LYMPHOCYTES (POC): 19.4 % (ref 10.0–58.5)
MCH (POC): 26.7 pg (ref 26.0–32.0)
MCHC (POC): 31.8 g/dL (ref 31.0–36.0)
MCV (POC): 83.9 fL (ref 80.0–97.0)
MID% POC: 7.5 % (ref 0.1–24.0)
MPV (POC): 7.8 fL (ref 0.0–49.9)
Mid # (POC): 0.7 10*3/uL (ref 0.0–1.8)
PLATELET (POC): 183 10*3/uL (ref 140–440)
RBC (POC): 4.27 10*6/uL (ref 4.20–6.30)
RDW (POC): 13.1 % (ref 11.5–14.5)
WBC (POC): 9.3 10*3/uL (ref 4.1–10.9)

## 2017-12-03 LAB — AMB POC URINALYSIS DIP STICK AUTO W/ MICRO
Bilirubin (UA POC): NEGATIVE
Blood (UA POC): NEGATIVE
Glucose (UA POC): NEGATIVE
Ketones (UA POC): NEGATIVE
Leukocyte esterase (UA POC): NEGATIVE
Nitrites (UA POC): NEGATIVE
Protein (UA POC): NEGATIVE
Specific gravity (UA POC): 1.015 (ref 1.001–1.035)
Urobilinogen (UA POC): 1 (ref 0.2–1)
pH (UA POC): 6 (ref 4.6–8.0)

## 2017-12-03 LAB — AMB POC GLUCOSE BLOOD, BY GLUCOSE MONITORING DEVICE: Glucose POC: 123 mg/dL

## 2017-12-03 MED ORDER — AZITHROMYCIN 250 MG TAB
250 mg | ORAL_TABLET | ORAL | 0 refills | Status: AC
Start: 2017-12-03 — End: 2017-12-08

## 2017-12-03 MED ORDER — BENZONATATE 200 MG CAP
200 mg | ORAL_CAPSULE | Freq: Three times a day (TID) | ORAL | 0 refills | Status: AC | PRN
Start: 2017-12-03 — End: 2017-12-10

## 2017-12-03 NOTE — Progress Notes (Signed)
SUBJECTIVE:   Sarah Chavez is a 77 y.o. female presents today reporting that she has had some discomfort on her left lateral chest wall, a productive cough and some midepigastric abdominal discomfort for about 4 days.  Patient reports no fever or body aches.  She reports no shortness of breath.  She denies any bowel or bladder dysfunction.  Current medicines are listed in the EMR and reviewed today.    HPI  See above    Past Medical History, Past Surgical History, Family history, Social History, and Medications were all reviewed with the patient today and updated as necessary.       Current Outpatient Medications   Medication Sig Dispense Refill   ??? benzonatate (TESSALON) 200 mg capsule Take 1 Cap by mouth three (3) times daily as needed for Cough for up to 7 days. 21 Cap 0   ??? azithromycin (ZITHROMAX Z-PAK) 250 mg tablet Take two tablets today then one tablet daily 6 Tab 0   ??? meloxicam (MOBIC) 7.5 mg tablet take 1 tablet by mouth once daily  0   ??? pravastatin (PRAVACHOL) 80 mg tablet Take 1 Tab by mouth nightly. 30 Tab 5   ??? carvedilol (COREG) 6.25 mg tablet Take 1 Tab by mouth two (2) times daily (with meals). 60 Tab 5   ??? lisinopril-hydroCHLOROthiazide (PRINZIDE, ZESTORETIC) 20-12.5 mg per tablet Take 1 Tab by mouth daily. 90 Tab 3   ??? famotidine (PEPCID) 20 mg tablet take 1 tablet by mouth once daily 90 Tab 3   ??? nitroglycerin (NITROSTAT) 0.4 mg SL tablet Place 1 sl under the tongue q 5 min prn cp, max 3 sl in a 15-min time period. Call 911 if no relief after the 3rd sl. 1 Bottle prn   ??? CHOLECALCIFEROL, VITAMIN D3, (VITAMIN D3 PO) Take  by mouth.     ??? aspirin 81 mg chewable tablet Take 1 Tab by mouth daily. 30 Tab 11     Allergies   Allergen Reactions   ??? Ativan [Lorazepam] Other (comments)     Made her feel crazy and chest pressure   ??? Ativan [Lorazepam] Other (comments)     Chest fullness   ??? Other Medication Palpitations     Some type of decongestant   ??? Other Medication Other (comments)      Doesn't take decongestants, but can't recall why     Patient Active Problem List   Diagnosis Code   ??? HTN (hypertension) I10   ??? Diabetes mellitus type 2, controlled (HCC) E11.9   ??? Coronary artery disease involving native coronary artery of native heart without angina pectoris I25.10   ??? Dyslipidemia E78.5   ??? OSA on CPAP G47.33, Z99.89   ??? Severe obesity (HCC) E66.01     Past Medical History:   Diagnosis Date   ??? Abdominal pain 07/22/2015   ??? Anemia    ??? Back pain    ??? Bursitis of shoulder    ??? CAD (coronary artery disease)    ??? Cerumen impaction    ??? Chest pain, atypical 07/22/2015   ??? Diabetes (HCC)    ??? Diabetes mellitus out of control (HCC)    ??? Endocrine disease     thyroid   ??? Fatigue    ??? Hip pain, right    ??? Hyperlipidemia, mild    ??? Hypertension    ??? Hypertension associated with diabetes (HCC) 07/22/2015   ??? 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 Left carotid  bruit    ??? Lumbago 07/22/2015   ??? Morbid obesity (HCC) 07/22/2015   ??? OSA on CPAP 07/22/2015   ??? Other ill-defined conditions(799.89)     cholesterol   ??? Other ill-defined conditions(799.89)     heart cath 96   ??? Shoulder pain, acute    ??? Shoulder pain, bilateral    ??? Sinusitis, acute maxillary    ??? Unstable angina (HCC) 07/06/2015   ??? Vitamin D deficiency    ??? Weakness      Past Surgical History:   Procedure Laterality Date   ??? HX HEENT      goiter/thyroid surgery   ??? HX THYROIDECTOMY       Family History   Problem Relation Age of Onset   ??? Stroke Sister    ??? Cancer Sister         cervical   ??? Stroke Brother    ??? Diabetes Other    ??? Hypertension Other      Social History     Tobacco Use   ??? Smoking status: Former Smoker     Last attempt to quit: 1971     Years since quitting: 48.2   ??? Smokeless tobacco: Never Used   ??? Tobacco comment: quit in 1971   Substance Use Topics   ??? Alcohol use: No         Review of Systems  See above    OBJECTIVE:  Visit Vitals  BP 134/80 (BP 1 Location: Left arm, BP Patient Position: Sitting)    Temp 98 ??F (36.7 ??C) (Oral)   Ht 5\' 2"  (1.575 m)   BMI 39.91 kg/m??        Physical Exam   Constitutional: She is oriented to person, place, and time. She appears well-developed and well-nourished.   HENT:   Head: Normocephalic and atraumatic.   Mouth/Throat: No oropharyngeal exudate.   TMs are dull bilaterally with clear effusions. OP is mildly erythematous without exudate.  Postnasal drip is noted.   Eyes: Pupils are equal, round, and reactive to light. Conjunctivae and EOM are normal.   Neck: Normal range of motion. Neck supple. No JVD present. No thyromegaly present.   Cardiovascular: Normal rate, regular rhythm and normal heart sounds. Exam reveals no gallop and no friction rub.   No murmur heard.  Pulmonary/Chest: Effort normal and breath sounds normal. No respiratory distress. She has no wheezes. She has no rales.   Scattered rhonchi are noted.   Abdominal: Soft. There is no tenderness. There is no rebound and no guarding.   Musculoskeletal: Normal range of motion. She exhibits no edema.   Patient has some palpable discomfort underneath the left breast that radiates around into her lower sternum.   Lymphadenopathy:     She has no cervical adenopathy.   Neurological: She is alert and oriented to person, place, and time.   Skin: No rash noted.   Psychiatric: She has a normal mood and affect.   Vitals reviewed.      Medical problems and test results were reviewed with the patient today.         ASSESSMENT and PLAN    1.  Epigastric discomfort.  UA and CBC were unremarkable.  Likely referred pain from pulled muscles.  Treat with moist heat and over-the-counter analgesics.  Return if symptoms persist or worsen.    2.  Cough.  Tessalon Perles 200 mg 3 times a day as needed.    3.  Bronchitis.  Z-Pak as  directed.    4.  Diabetes.  Random blood sugar is 123.  Most recent A1c look good.  Continue current therapy.

## 2017-12-24 ENCOUNTER — Encounter: Attending: Cardiovascular Disease | Primary: Family Medicine

## 2017-12-28 ENCOUNTER — Ambulatory Visit: Admit: 2017-12-28 | Discharge: 2017-12-28 | Payer: MEDICARE | Attending: Family Medicine | Primary: Family Medicine

## 2017-12-28 DIAGNOSIS — R350 Frequency of micturition: Secondary | ICD-10-CM

## 2017-12-28 LAB — AMB POC URINALYSIS DIP STICK AUTO W/ MICRO
Bilirubin (UA POC): NEGATIVE
Blood (UA POC): NEGATIVE
Glucose (UA POC): NEGATIVE
Ketones (UA POC): NEGATIVE
Leukocyte esterase (UA POC): NEGATIVE
Nitrites (UA POC): NEGATIVE
Protein (UA POC): NEGATIVE
Specific gravity (UA POC): 1.015 (ref 1.001–1.035)
Urobilinogen (UA POC): 1 (ref 0.2–1)
pH (UA POC): 6 (ref 4.6–8.0)

## 2017-12-28 LAB — AMB POC COMPLETE CBC,AUTOMATED ENTER
ABS. GRANS (POC): 7.1 10*3/uL (ref 2.0–7.8)
ABS. LYMPHS (POC): 1.9 10*3/uL (ref 0.6–4.1)
GRANULOCYTES (POC): 71 % (ref 37.0–92.0)
HCT (POC): 31.5 % — AB (ref 37.0–51.0)
HGB (POC): 10.2 g/dL — AB (ref 12.0–18.0)
LYMPHOCYTES (POC): 19.3 % (ref 10.0–58.5)
MCH (POC): 27.1 pg (ref 26.0–32.0)
MCHC (POC): 32.4 g/dL (ref 31.0–36.0)
MCV (POC): 83.6 fL (ref 80.0–97.0)
MID% POC: 9.7 % (ref 0.1–24.0)
MPV (POC): 6.7 fL (ref 0.0–49.9)
Mid # (POC): 1 10*3/uL (ref 0.0–1.8)
PLATELET (POC): 216 10*3/uL (ref 140–440)
RBC (POC): 3.77 10*6/uL — AB (ref 4.20–6.30)
RDW (POC): 13.2 % (ref 11.5–14.5)
WBC (POC): 10 10*3/uL (ref 4.1–10.9)

## 2017-12-28 MED ORDER — TRIMETHOPRIM-SULFAMETHOXAZOLE 160 MG-800 MG TAB
160-800 mg | ORAL_TABLET | Freq: Two times a day (BID) | ORAL | 0 refills | Status: AC
Start: 2017-12-28 — End: 2018-01-04

## 2017-12-28 NOTE — Progress Notes (Signed)
SUBJECTIVE:   Sarah Chavez is a 77 y.o. female who has a past medical history significant for diabetes, hypertension, high cholesterol, CAD, lower extremity edema, vitamin D deficiency and severe obesity.   Patient presents today complaining of a 2 to 3-week history of worsening dysuria and lower abdominal discomfort.  She reports some urinary frequency as well.  She denies fever or nausea or vomiting.  Patient reports no chest pain, shortness of breath, orthopnea or PND.  Current medicines are listed in the EMR and reviewed today.    HPI  See above    Past Medical History, Past Surgical History, Family history, Social History, and Medications were all reviewed with the patient today and updated as necessary.       Current Outpatient Medications   Medication Sig Dispense Refill   ??? trimethoprim-sulfamethoxazole (BACTRIM DS, SEPTRA DS) 160-800 mg per tablet Take 1 Tab by mouth two (2) times a day for 7 days. 14 Tab 0   ??? meloxicam (MOBIC) 7.5 mg tablet take 1 tablet by mouth once daily  0   ??? pravastatin (PRAVACHOL) 80 mg tablet Take 1 Tab by mouth nightly. 30 Tab 5   ??? carvedilol (COREG) 6.25 mg tablet Take 1 Tab by mouth two (2) times daily (with meals). 60 Tab 5   ??? lisinopril-hydroCHLOROthiazide (PRINZIDE, ZESTORETIC) 20-12.5 mg per tablet Take 1 Tab by mouth daily. 90 Tab 3   ??? famotidine (PEPCID) 20 mg tablet take 1 tablet by mouth once daily 90 Tab 3   ??? nitroglycerin (NITROSTAT) 0.4 mg SL tablet Place 1 sl under the tongue q 5 min prn cp, max 3 sl in a 15-min time period. Call 911 if no relief after the 3rd sl. 1 Bottle prn   ??? CHOLECALCIFEROL, VITAMIN D3, (VITAMIN D3 PO) Take  by mouth.     ??? aspirin 81 mg chewable tablet Take 1 Tab by mouth daily. 30 Tab 11     Allergies   Allergen Reactions   ??? Ativan [Lorazepam] Other (comments)     Made her feel crazy and chest pressure   ??? Ativan [Lorazepam] Other (comments)     Chest fullness   ??? Other Medication Palpitations     Some type of decongestant    ??? Other Medication Other (comments)     Doesn't take decongestants, but can't recall why     Patient Active Problem List   Diagnosis Code   ??? HTN (hypertension) I10   ??? Diabetes mellitus type 2, controlled (HCC) E11.9   ??? Coronary artery disease involving native coronary artery of native heart without angina pectoris I25.10   ??? Dyslipidemia E78.5   ??? OSA on CPAP G47.33, Z99.89   ??? Severe obesity (HCC) E66.01     Past Medical History:   Diagnosis Date   ??? Abdominal pain 07/22/2015   ??? Anemia    ??? Back pain    ??? Bursitis of shoulder    ??? CAD (coronary artery disease)    ??? Cerumen impaction    ??? Chest pain, atypical 07/22/2015   ??? Diabetes (HCC)    ??? Diabetes mellitus out of control (HCC)    ??? Endocrine disease     thyroid   ??? Fatigue    ??? Hip pain, right    ??? Hyperlipidemia, mild    ??? Hypertension    ??? Hypertension associated with diabetes (HCC) 07/22/2015   ??? 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 Left carotid bruit    ???  Lumbago 07/22/2015   ??? Morbid obesity (HCC) 07/22/2015   ??? OSA on CPAP 07/22/2015   ??? Other ill-defined conditions(799.89)     cholesterol   ??? Other ill-defined conditions(799.89)     heart cath 96   ??? Shoulder pain, acute    ??? Shoulder pain, bilateral    ??? Sinusitis, acute maxillary    ??? Unstable angina (HCC) 07/06/2015   ??? Vitamin D deficiency    ??? Weakness      Past Surgical History:   Procedure Laterality Date   ??? HX HEENT      goiter/thyroid surgery   ??? HX THYROIDECTOMY       Family History   Problem Relation Age of Onset   ??? Stroke Sister    ??? Cancer Sister         cervical   ??? Stroke Brother    ??? Diabetes Other    ??? Hypertension Other      Social History     Tobacco Use   ??? Smoking status: Former Smoker     Last attempt to quit: 1971     Years since quitting: 48.3   ??? Smokeless tobacco: Never Used   ??? Tobacco comment: quit in 1971   Substance Use Topics   ??? Alcohol use: No         Review of Systems  See above    OBJECTIVE:  Visit Vitals  Ht 5\' 2"  (1.575 m)    Wt 205 lb (93 kg)   BMI 37.49 kg/m??        Physical Exam   Constitutional: She is oriented to person, place, and time. She appears well-developed and well-nourished.   HENT:   Head: Normocephalic and atraumatic.   Eyes: Pupils are equal, round, and reactive to light. EOM are normal.   Neck: Normal range of motion. Neck supple. No JVD present. No thyromegaly present.   Cardiovascular: Normal rate, regular rhythm and normal heart sounds. Exam reveals no gallop and no friction rub.   No murmur heard.  Pulmonary/Chest: Effort normal. No respiratory distress. She has no wheezes. She has no rales.   Abdominal: Soft. There is no tenderness. There is no rebound and no guarding.   Musculoskeletal: Normal range of motion. She exhibits no edema.   Neurological: She is alert and oriented to person, place, and time.   Skin: No rash noted.   Psychiatric: She has a normal mood and affect.       Medical problems and test results were reviewed with the patient today.         ASSESSMENT and PLAN    1.  Frequency/dysuria.  UA does not show obvious infection.  Patient symptoms however clinically consistent with infection.  We will empirically treat with Septra DS twice a day for a week.  If symptoms persist consider further work-up and/or alternative etiology.  Patient is in agreement.    2.  Lower abdominal discomfort.  CBC shows normal white count.  However hemoglobin is 10.2.  Previously 11.1.  MCV is 83.6.  Refer to hematology for further evaluation.  Patient's most recent creatinine was stable and normal.  She reports no melena or hematochezia.    3.  Normocytic anemia as above.  Referring to hematology for further evaluation.    4.  Diabetes.  Most recent A1c was 6.6.    6 5.  Hypertension.  Blood pressure is well maintained.  Continue current therapy.

## 2018-01-09 NOTE — Progress Notes (Signed)
New Patient Abstract    Reason for Referral:  Anemia    Referring Provider:  Dr. Lucia Gaskins    Primary Care Provider:  Dr. Lucia Gaskins    Family History of Cancer/Hematologic disorders:  Sister, cervical ca    Presenting Symptoms:  Anemia    Narrative with recent with Results/Procedures/Biopsies and Dates completed:  Sarah Chavez is a 77 year old female.  She presented to Dr. Lucia Gaskins for follow up of dysuria and lower abdominal discomfort.  Labs were performed at this visit and Sarah Chavez Hgb was lower.  She is referred for evaluation of progressive normocytic anemia.     09/28/2017 10:32 09/28/2017 11:24 12/03/2017 11:48 12/28/2017 10:33   Hematocrit (POC)  34.1 (A) 35.8 (A) 31.5 (A)   RBC (POC)  3.96 (A) 4.27 3.77 (A)   WBC (POC)  9.8 9.3 10.0   HGB (POC)  11.4 (A) 11.4 (A) 10.2 (A)   MCV (POC)  86.0 83.9 83.6   MCH (POC)  28.8 26.7 27.1   MCHC (POC)  33.4 31.8 32.4   RDW (POC)  12.6 13.1 13.2   PLATELET (POC)  303 183 216   MPV (POC)  7.3 7.8 6.7   GRANULOCYTES (POC)  61.3 73.1 71.0   MID% POC  11.4 7.5 9.7   LYMPHOCYTES (POC)  27.3 19.4 19.3   ABS. GRANS (POC)  6.0 6.8 7.1   ABS. LYMPHS (POC)  2.7 1.8 1.9   Sodium 141      Potassium 4.0      Chloride 101      CO2 26      Glucose 118 (H)      BUN 17      Creatinine 1.05 (H)      BUN/Creatinine ratio 16      Calcium 8.9      GFR est non-AA 51 (L)      GFR est AA 59 (L)      Bilirubin, total 0.5      Protein, total 7.9      Albumin 4.0      A-G Ratio 1.0 (L)      ALT (SGPT) 12      AST 13      Alk. phos 62      Triglyceride 61      Cholesterol, total 148      HDL Cholesterol 53      LDL, calculated 83      VLDL, calculated 12      TSH 0.989      VITAMIN D, 25-HYDROXY 17.8 (L)        Notes from referring Provider:  Normocytic anemia as above.  Referring to hematology for further evaluation.    Other pertinent information:  She is taking meloxicam 7.5 mg and ASA 81 mg daily.  She was prescribed Bactrim DS 800 mg BID this visit x 7 days     Presented at Tumor Board:  n/a

## 2018-01-13 ENCOUNTER — Encounter

## 2018-01-13 ENCOUNTER — Inpatient Hospital Stay: Admit: 2018-01-13 | Discharge: 2018-01-13 | Payer: MEDICARE | Primary: Family Medicine

## 2018-01-13 ENCOUNTER — Ambulatory Visit
Admit: 2018-01-13 | Discharge: 2018-01-13 | Payer: MEDICARE | Attending: Hematology & Oncology | Primary: Family Medicine

## 2018-01-13 DIAGNOSIS — D649 Anemia, unspecified: Secondary | ICD-10-CM

## 2018-01-13 LAB — METABOLIC PANEL, COMPREHENSIVE
A-G Ratio: 0.6 — ABNORMAL LOW (ref 1.2–3.5)
ALT (SGPT): 11 U/L — ABNORMAL LOW (ref 12–65)
AST (SGOT): 12 U/L — ABNORMAL LOW (ref 15–37)
Albumin: 3.2 g/dL (ref 3.2–4.6)
Alk. phosphatase: 66 U/L (ref 50–136)
Anion gap: 6 mmol/L — ABNORMAL LOW (ref 7–16)
BUN: 19 MG/DL (ref 8–23)
Bilirubin, total: 0.8 MG/DL (ref 0.2–1.1)
CO2: 26 mmol/L (ref 21–32)
Calcium: 9.5 MG/DL (ref 8.3–10.4)
Chloride: 104 mmol/L (ref 98–107)
Creatinine: 1.18 MG/DL — ABNORMAL HIGH (ref 0.6–1.0)
GFR est AA: 57 mL/min/{1.73_m2} — ABNORMAL LOW (ref >60)
GFR est non-AA: 47 mL/min/{1.73_m2} — ABNORMAL LOW (ref >60)
Globulin: 5.6 g/dL — ABNORMAL HIGH (ref 2.3–3.5)
Glucose: 102 mg/dL — ABNORMAL HIGH (ref 65–100)
Potassium: 4 mmol/L (ref 3.5–5.1)
Protein, total: 8.8 g/dL — ABNORMAL HIGH (ref 6.3–8.2)
Sodium: 136 mmol/L (ref 136–145)

## 2018-01-13 LAB — CBC WITH AUTOMATED DIFF
ABS. BASOPHILS: 0 10*3/uL (ref 0.0–0.2)
ABS. EOSINOPHILS: 0.1 10*3/uL (ref 0.0–0.8)
ABS. IMM. GRANS.: 0 10*3/uL (ref 0.0–0.5)
ABS. LYMPHOCYTES: 1.9 10*3/uL (ref 0.5–4.6)
ABS. MONOCYTES: 0.9 10*3/uL (ref 0.1–1.3)
ABS. NEUTROPHILS: 5.9 10*3/uL (ref 1.7–8.2)
ABSOLUTE NRBC: 0 10*3/uL (ref 0.0–0.2)
BASOPHILS: 0 % (ref 0.0–2.0)
EOSINOPHILS: 1 % (ref 0.5–7.8)
HCT: 30.1 % — ABNORMAL LOW (ref 35.8–46.3)
HGB: 9.6 g/dL — ABNORMAL LOW (ref 11.7–15.4)
IMMATURE GRANULOCYTES: 0 % (ref 0.0–5.0)
LYMPHOCYTES: 21 % (ref 13–44)
MCH: 27 PG (ref 26.1–32.9)
MCHC: 31.9 g/dL (ref 31.4–35.0)
MCV: 84.6 FL (ref 79.6–97.8)
MONOCYTES: 10 % (ref 4.0–12.0)
MPV: 10 FL (ref 9.4–12.3)
NEUTROPHILS: 67 % (ref 43–78)
PLATELET: 208 10*3/uL (ref 150–450)
RBC: 3.56 M/uL — ABNORMAL LOW (ref 4.05–5.25)
RDW: 14.4 % (ref 11.9–14.6)
WBC: 8.9 10*3/uL (ref 4.3–11.1)

## 2018-01-13 LAB — TRANSFERRIN SATURATION
Iron: 28 ug/dL — ABNORMAL LOW (ref 35–150)
TIBC: 203 ug/dL — ABNORMAL LOW (ref 250–450)
Transferrin Saturation: 14 % — ABNORMAL LOW (ref >20)

## 2018-01-13 LAB — T4, FREE: T4, Free: 1.4 NG/DL (ref 0.78–1.46)

## 2018-01-13 LAB — FOLATE: Folate: 7 ng/mL (ref 3.1–17.5)

## 2018-01-13 LAB — RETICULOCYTE COUNT
Absolute Retic Cnt.: 0.0513 M/ul (ref 0.026–0.095)
Immature Retic Fraction: 16 % — ABNORMAL HIGH (ref 3.0–15.9)
Retic Hgb Conc.: 30 pg (ref 29–35)
Reticulocyte count: 1.4 % (ref 0.3–2.0)

## 2018-01-13 LAB — SED RATE, AUTOMATED: Sed rate, automated: 111 mm/hr — ABNORMAL HIGH (ref 0–30)

## 2018-01-13 LAB — HAPTOGLOBIN: Haptoglobin: 377 mg/dL — ABNORMAL HIGH (ref 30–200)

## 2018-01-13 LAB — BILIRUBIN,INDIRECT: Bilirubin, indirect: 0.6 MG/DL (ref 0.0–1.1)

## 2018-01-13 LAB — LD: LD: 151 U/L (ref 110–210)

## 2018-01-13 LAB — BILIRUBIN, DIRECT: Bilirubin, direct: 0.2 MG/DL (ref <0.4)

## 2018-01-13 LAB — FERRITIN: Ferritin: 554 NG/ML — ABNORMAL HIGH (ref 8–388)

## 2018-01-13 LAB — TSH 3RD GENERATION: TSH: 0.99 u[IU]/mL (ref 0.358–3.740)

## 2018-01-13 LAB — VITAMIN B12: Vitamin B12: 888 pg/mL (ref 193–986)

## 2018-01-13 LAB — C REACTIVE PROTEIN, QT: C-Reactive protein: 7.2 mg/dL — ABNORMAL HIGH (ref 0.0–0.9)

## 2018-01-13 NOTE — Progress Notes (Signed)
Outpatient Consult Note    Data Source: Patient, [facility] record.    01/13/2018  9:29 AM      Sarah Chavez [id number]    77 y.o.    Patient Encounter: [facility] Note    Reason for consult:  Anemia    HPI:  48 female,  ex-smoker history of CAD, DM, hypothyroidism, dyslipidemia, hypertension, obese habitus, vitamin D deficiency, more recently seen by primary care and treated for UTI.  On review of labs, patient was found to have worsening isolated anemia with hemoglobin at 10.2.  MCV and RDW normal.  Other cell lines unremarkable.  Her last normal hemoglobin was on 11/18, and this has subsequently dropped over time.  Now referred to [place] for further work-up and management. Denies any blood per rectum, change in color of stools, hematuria or vaginal bleeding. Denies ever being told of anemia in the past.     Past Medical History:   Diagnosis Date   ??? Abdominal pain 07/22/2015   ??? Anemia    ??? Back pain    ??? Bursitis of shoulder    ??? CAD (coronary artery disease)    ??? Cerumen impaction    ??? Chest pain, atypical 07/22/2015   ??? Diabetes ([place])    ??? Diabetes mellitus out of control ([place])    ??? Endocrine disease     thyroid   ??? Fatigue    ??? Hip pain, right    ??? Hyperlipidemia, mild    ??? Hypertension    ??? Hypertension associated with diabetes ([place]) 07/22/2015   ??? Hypertensive pulmonary vascular disease ([place]) 07/22/2015   ??? Left carotid bruit    ??? Lumbago 07/22/2015   ??? Morbid obesity ([place]) 07/22/2015   ??? OSA on CPAP 07/22/2015   ??? Other ill-defined conditions(799.89)     cholesterol   ??? Other ill-defined conditions(799.89)     heart cath 96   ??? Shoulder pain, acute    ??? Shoulder pain, bilateral    ??? Sinusitis, acute maxillary    ??? Unstable angina ([place]) 07/06/2015   ??? Vitamin D deficiency    ??? Weakness          Past Surgical History:   Procedure Laterality Date   ??? HX HEENT      goiter/thyroid surgery   ??? HX THYROIDECTOMY           Current Outpatient Medications   Medication Sig Dispense Refill    ??? pravastatin (PRAVACHOL) 80 mg tablet Take 1 Tab by mouth nightly. 30 Tab 5   ??? lisinopril-hydroCHLOROthiazide (PRINZIDE, ZESTORETIC) 20-12.5 mg per tablet Take 1 Tab by mouth daily. 90 Tab 3   ??? famotidine (PEPCID) 20 mg tablet take 1 tablet by mouth once daily 90 Tab 3   ??? nitroglycerin (NITROSTAT) 0.4 mg SL tablet Place 1 sl under the tongue q 5 min prn cp, max 3 sl in a 15-min time period. Call 911 if no relief after the 3rd sl. 1 Bottle prn   ??? CHOLECALCIFEROL, VITAMIN D3, (VITAMIN D3 PO) Take  by mouth.     ??? aspirin 81 mg chewable tablet Take 1 Tab by mouth daily. 30 Tab 11   ??? meloxicam (MOBIC) 7.5 mg tablet take 1 tablet by mouth once daily  0   ??? carvedilol (COREG) 6.25 mg tablet Take 1 Tab by mouth two (2) times daily (with meals). 60 Tab 5         Social History  Socioeconomic History   ??? Marital status: SINGLE     Spouse name: Not on file   ??? Number of children: Not on file   ??? Years of education: Not on file   ??? Highest education level: Not on file   Tobacco Use   ??? Smoking status: Former Smoker     Last attempt to quit: 1971     Years since quitting: 48.3   ??? Smokeless tobacco: Never Used   ??? Tobacco comment: quit in 1971   Substance and Sexual Activity   ??? Alcohol use: No   ??? Drug use: No   Social History Narrative    ** Merged History Encounter **              Family History   Problem Relation Age of Onset   ??? Stroke Sister    ??? Cancer Sister         cervical   ??? Stroke Brother    ??? Diabetes Other    ??? Hypertension Other          REVIEW OF SYSTEMS:  As mentioned above, all other systems were reviewed in full and are negative.      Allergies   Allergen Reactions   ??? Ativan [Lorazepam] Other (comments)     Made her feel crazy and chest pressure   ??? Ativan [Lorazepam] Other (comments)     Chest fullness   ??? Other Medication Palpitations     Some type of decongestant   ??? Other Medication Other (comments)     Doesn't take decongestants, but can't recall why           PHYSICAL EXAMINATION:   General Appearance: Healthy appearing patient in no acute distress  Vitals were reviewed.  Visit Vitals  BP 122/76 (BP 1 Location: Left arm, BP Patient Position: Sitting)   Pulse 68   Temp 98.5 ??F (36.9 ??C) (Oral)   Resp 18   Wt 200 lb 12.8 oz (91.1 kg)   SpO2 98%   BMI 36.73 kg/m??     HEENT: No oral or pharyngeal masses, ulceration or thrush noted, no sinus tenderness.  Neck is supple with no thyromegaly or JVD noted.  Lymph Nodes: No lymphadenopathy noted in the occipital, pre and post auricular, cervical, supra and infraclavicular, axillary, epitrochlear, inguinal, and popliteal region.  Breasts: No palpable masses, nipple discharge or skin retraction  Lungs/Thorax: Clear to auscultation, no accessory muscles of respiration being used.  Heart: Regular rate and rhythm, normal S1, S2, no appreciable murmurs, rubs, gallops  Abdomen: Soft, nontender, bowel sounds present, no appreciable hepatosplenomegaly, no palpable masses  Extremeties: Good pulses bilaterally, no peripheral edema.  Skin: Normal skin tone with no rash, petechiae, ecchymosis noted.  Musculoskeletal: No pain on palpation over bony prominence, no edema, no evidence of gout, no joint or bony deformity  Neurologic: Grossly intact    LABS/IMAGING:    Lab Results   Component Value Date/Time    WBC 8.9 01/13/2018 10:18 AM    HGB 9.6 (L) 01/13/2018 10:18 AM    HCT 30.1 (L) 01/13/2018 10:18 AM    PLATELET 208 01/13/2018 10:18 AM    MCV 84.6 01/13/2018 10:18 AM       Lab Results   Component Value Date/Time    Sodium 136 01/13/2018 10:18 AM    Potassium 4.0 01/13/2018 10:18 AM    Chloride 104 01/13/2018 10:18 AM    CO2 26 01/13/2018 10:18 AM    Anion gap  6 (L) 01/13/2018 10:18 AM    Glucose 102 (H) 01/13/2018 10:18 AM    BUN 19 01/13/2018 10:18 AM    Creatinine 1.18 (H) 01/13/2018 10:18 AM    BUN/Creatinine ratio 16 09/28/2017 10:32 AM    GFR est AA 57 (L) 01/13/2018 10:18 AM    GFR est non-AA 47 (L) 01/13/2018 10:18 AM    Calcium 9.5 01/13/2018 10:18 AM     AST (SGOT) 12 (L) 01/13/2018 10:18 AM    Alk. phosphatase 66 01/13/2018 10:18 AM    Protein, total 8.8 (H) 01/13/2018 10:18 AM    Protein, total 8.5 (H) 01/13/2018 10:18 AM    Albumin 3.2 01/13/2018 10:18 AM    Globulin 5.6 (H) 01/13/2018 10:18 AM    A-G Ratio 0.6 (L) 01/13/2018 10:18 AM    A-G Ratio PENDING 01/13/2018 10:18 AM    ALT (SGPT) 11 (L) 01/13/2018 10:18 AM           Above results reviewed with patient     ASSESSMENT:  22 female,  ex-smoker history of CAD, DM, hypothyroidism, dyslipidemia, hypertension, obese habitus, vitamin D deficiency, more recently seen by primary care and treated for UTI.  On review of labs, patient was found to have worsening isolated anemia with hemoglobin at 10.2.  MCV and RDW normal.  Other cell lines unremarkable.  Her last normal hemoglobin was on 11/18, and this has subsequently dropped over time.  Now referred to [place] for further work-up and management. Denies any blood per rectum, change in color of stools, hematuria or vaginal bleeding. Denies ever being told of anemia in the past. Continues to report ongoing abdominal discomfort and dysuria for which she will f/u w her primary care.    Will initiate w/u as below:      PLAN:  - Routine labs, iron panel, FOBT, Retic  - CRP, ESR, Soluble transferrin receptor  - B12, Folate, HC, MMA  - LDH, Haptoglobin, fractionated bilirubin  - SPEP  - TSH/Free T4  - If IDA, then IV ferumoxytol x 2. Will also need a GI and Gyn eval in such a case.     RTC in 4-6 weeks, repeat labs    Thank you Dr Lucia Gaskins for allowing [place] to paticipate in care of this pleasant patient. Please call w/ any quesions    Total time 60 min 50% in direct consultation about the patient's diagnosis and management. All questions answered.  Letha Cape, MD  [facility]  [facility]  [address] [place] [address]  Office : [phone]  Fax : [phone]

## 2018-01-13 NOTE — Patient Instructions (Addendum)
Patient Instructions from Today's Visit  Reason for Visit:  New patient appointment for anemia (low hemoglobin)    Plan:  You will have lab work today to try to figure out why you are anemic.    You need to call Dr. Allene Pyo office and let them know your belly pain isn't any better.    Follow Up:  Follow up in 4-6 weeks to go over results    Recent Lab Results:    Treatment Summary has been discussed and given to patient: n/a        -------------------------------------------------------------------------------------------------------------------  Please call our office at [phone] if you have any  of the following symptoms:   ?? Fever of 100.5 or greater  ?? Chills  ?? Shortness of breath  ?? Swelling or pain in one leg    After office hours an answering service is available and will contact a provider for emergencies or if you are experiencing any of the above symptoms.    ? Patient did express an interest in My Chart.  My Chart log in information explained on the after visit summary printout at the check-out desk.    Weyman Croon, RN

## 2018-01-14 LAB — HOMOCYSTEINE, PLASMA: Homocysteine, plasma: 19.2 umol/L — ABNORMAL HIGH (ref 0.0–15.0)

## 2018-01-15 LAB — METHYLMALONIC ACID: Methylmalonic acid: 197 nmol/L (ref 0–378)

## 2018-01-17 LAB — PROTEIN ELEC WITH IFE, SERUM
A-G Ratio: 0.7
ALBUMIN: 3.6 g/dL (ref 3.20–5.60)
ALPHA 2: 1.06 g/dL (ref 0.40–1.20)
Alpha-1 globulin: 0.4 g/dL (ref 0.10–0.40)
Beta-globulin: 1.37 g/dL — ABNORMAL HIGH (ref 0.60–1.30)
Gamma-globulin: 2.06 g/dL — ABNORMAL HIGH (ref 0.50–1.60)
Immunoglobulin A: 693 mg/dL — ABNORMAL HIGH (ref 85–499)
Immunoglobulin G: 1914 mg/dL — ABNORMAL HIGH (ref 610–1616)
Immunoglobulin M: 113 mg/dL (ref 35–242)
Protein, total: 8.5 g/dL — ABNORMAL HIGH (ref 6.3–8.2)

## 2018-01-18 LAB — SOLUBLE TRANSFERRIN RECEPTOR: Soluble Transferrin Receptor: 19.7 nmol/L (ref 12.2–27.3)

## 2018-02-04 ENCOUNTER — Ambulatory Visit: Attending: Cardiovascular Disease | Primary: Family Medicine

## 2018-02-04 ENCOUNTER — Ambulatory Visit
Admit: 2018-02-04 | Discharge: 2018-02-04 | Payer: MEDICARE | Attending: Cardiovascular Disease | Primary: Family Medicine

## 2018-02-04 DIAGNOSIS — I10 Essential (primary) hypertension: Secondary | ICD-10-CM

## 2018-02-04 NOTE — Progress Notes (Signed)
[address], [address]  [place], [place] [address]  PHONE: [phone]    LYANN HAGSTROM  12-Mar-1941      SUBJECTIVE:   Sarah Chavez is a 77 y.o. female seen for a follow up visit regarding the following:     Chief Complaint   Patient presents with   ??? Coronary Artery Disease   ??? Hypertension       HPI:    77 year old female returns for follow-up of history of some minor coronary disease.  Earlier this year we did a nuclear study which was negative and her overall LV function was normal.  She is been losing some weight and has been having some normocytic anemia.  She is referred to hematology he did a bunch of blood tests which showed a significant elevated sed rate at 111 and very high CRP.  She had a poly-gammopathy suggestive of inflammatory disease.  Etiology of this is not clear.  From a cardiac standpoint she has not been having significant problems.  He does have a lot of problems with her back and right hip done walk well      Past Medical History, Past Surgical History, Family history, Social History, and Medications were all reviewed with the patient today and updated as necessary.     Outpatient Medications Marked as Taking for the 02/04/18 encounter (Office Visit) with Maree Krabbe, MD   Medication Sig Dispense Refill   ??? pravastatin (PRAVACHOL) 40 mg tablet Take 40 mg by mouth two (2) times a day.     ??? meloxicam (MOBIC) 7.5 mg tablet take 1 tablet by mouth once daily  0   ??? lisinopril-hydroCHLOROthiazide (PRINZIDE, ZESTORETIC) 20-12.5 mg per tablet Take 1 Tab by mouth daily. 90 Tab 3   ??? famotidine (PEPCID) 20 mg tablet take 1 tablet by mouth once daily 90 Tab 3   ??? nitroglycerin (NITROSTAT) 0.4 mg SL tablet Place 1 sl under the tongue q 5 min prn cp, max 3 sl in a 15-min time period. Call 911 if no relief after the 3rd sl. 1 Bottle prn   ??? aspirin 81 mg chewable tablet Take 1 Tab by mouth daily. 30 Tab 11     Allergies   Allergen Reactions   ??? Ativan [Lorazepam] Other (comments)      Made her feel crazy and chest pressure   ??? Ativan [Lorazepam] Other (comments)     Chest fullness   ??? Other Medication Palpitations     Some type of decongestant   ??? Other Medication Other (comments)     Doesn't take decongestants, but can't recall why     Past Medical History:   Diagnosis Date   ??? Abdominal pain 07/22/2015   ??? Anemia    ??? Back pain    ??? Bursitis of shoulder    ??? CAD (coronary artery disease)    ??? Cerumen impaction    ??? Chest pain, atypical 07/22/2015   ??? Diabetes (HCC)    ??? Diabetes mellitus out of control (HCC)    ??? Endocrine disease     thyroid   ??? Fatigue    ??? Hip pain, right    ??? Hyperlipidemia, mild    ??? Hypertension    ??? Hypertension associated with diabetes (HCC) 07/22/2015   ??? 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 Left carotid bruit    ??? Lumbago 07/22/2015   ??? Morbid obesity (HCC) 07/22/2015   ??? OSA on CPAP 07/22/2015   ???  Other ill-defined conditions(799.89)     cholesterol   ??? Other ill-defined conditions(799.89)     heart cath 96   ??? Shoulder pain, acute    ??? Shoulder pain, bilateral    ??? Sinusitis, acute maxillary    ??? Unstable angina (HCC) 07/06/2015   ??? Vitamin D deficiency    ??? Weakness      Past Surgical History:   Procedure Laterality Date   ??? HX HEENT      goiter/thyroid surgery   ??? HX THYROIDECTOMY       Family History   Problem Relation Age of Onset   ??? Stroke Sister    ??? Cancer Sister         cervical   ??? Stroke Brother    ??? Diabetes Other    ??? Hypertension Other       Social History     Tobacco Use   ??? Smoking status: Former Smoker     Last attempt to quit: 1971     Years since quitting: 48.4   ??? Smokeless tobacco: Never Used   ??? Tobacco comment: quit in 1971   Substance Use Topics   ??? Alcohol use: No       ROS:    Review of Systems   Constitution: Positive for weight loss. Negative for fever.   Cardiovascular: Negative for chest pain and dyspnea on exertion.   Respiratory: Negative for cough and shortness of breath.          PHYSICAL EXAM:     Visit Vitals  BP 116/60   Pulse 72   Ht 5\' 2"  (1.575 m)   Wt 193 lb 14.4 oz (88 kg)   BMI 35.46 kg/m??        Wt Readings from Last 3 Encounters:   02/04/18 193 lb 14.4 oz (88 kg)   01/13/18 200 lb 12.8 oz (91.1 kg)   12/28/17 205 lb (93 kg)     BP Readings from Last 3 Encounters:   02/04/18 116/60   01/13/18 122/76   12/03/17 134/80         Physical Exam   Constitutional: She appears well-developed.   Cardiovascular: Normal rate and regular rhythm.   Musculoskeletal: She exhibits no edema.   Neurological: She is alert.       Medical problems and test results were reviewed with the patient today.     No results found for any visits on 02/04/18.  Lab Results   Component Value Date/Time    Sodium 136 01/13/2018 10:18 AM    Potassium 4.0 01/13/2018 10:18 AM    Chloride 104 01/13/2018 10:18 AM    CO2 26 01/13/2018 10:18 AM    Anion gap 6 (L) 01/13/2018 10:18 AM    Glucose 102 (H) 01/13/2018 10:18 AM    BUN 19 01/13/2018 10:18 AM    Creatinine 1.18 (H) 01/13/2018 10:18 AM    BUN/Creatinine ratio 16 09/28/2017 10:32 AM    GFR est AA 57 (L) 01/13/2018 10:18 AM    GFR est non-AA 47 (L) 01/13/2018 10:18 AM    Calcium 9.5 01/13/2018 10:18 AM     Lab Results   Component Value Date/Time    Cholesterol, total 148 09/28/2017 10:32 AM    HDL Cholesterol 53 09/28/2017 10:32 AM    LDL, calculated 83 09/28/2017 10:32 AM    VLDL, calculated 12 09/28/2017 10:32 AM    Triglyceride 61 09/28/2017 10:32 AM    CHOL/HDL Ratio 2.5 07/07/2015 05:15 AM  ASSESSMENT and PLAN    Diagnoses and all orders for this visit:    1. Essential hypertension overall stable on medications    2. Coronary artery disease involving native coronary artery of native heart without angina pectoris not have any symptoms symptoms recent nuclear study this year was negative  3. Severe obesity (HCC) has lost weight but she says is not trying to    4. Dyslipidemia we will continue statin therapy.    5. Elevated sed rate she had a markedly elevated sed rate and CRP  suggesting some sort of inflammation she will need to be continued work-up by hematology and her family doctor for this to try to find that works coming from    6. Normocytic anemia she has a hemoglobin around 9 which is more normocytic.  She does need to follow-up with hematology               Maree Krabbe, MD  02/04/2018  9:29 AM

## 2018-02-04 NOTE — Progress Notes (Signed)
Progress  Notes by Maree Krabbe, MD at 02/04/18 0900                Author: Maree Krabbe, MD  Service: --  Author Type: Physician       Filed: 02/04/18 1112  Encounter Date: 02/04/2018  Status: Signed          Editor: Maree Krabbe, MD (Physician)                    [address], [address]   [place], [place] [address]   PHONE: [phone]      Sarah Chavez   1941/03/30         SUBJECTIVE:    Sarah Chavez is a 77 y.o.  female seen for a follow up visit regarding the following:         Chief Complaint       Patient presents with        ?  Coronary Artery Disease        ?  Hypertension           HPI:      77 year old female returns for follow-up of history of some minor coronary disease.  Earlier this year we did a  nuclear study which was negative and her overall LV function was normal.  She is been losing some weight and has been having some normocytic anemia.  She is referred to hematology he did a bunch of blood tests which showed a significant elevated sed rate  at 111 and very high CRP.  She had a poly-gammopathy suggestive of inflammatory disease.  Etiology of this is not clear.  From a cardiac standpoint she has not been having significant problems.  He does have a lot of problems with her back and right hip  done walk well         Past Medical History, Past Surgical History, Family history, Social History, and Medications were all reviewed with the patient today and updated as necessary.         Outpatient Medications Marked as Taking for the 02/04/18 encounter (Office Visit) with Maree Krabbe, MD          Medication  Sig  Dispense  Refill           ?  pravastatin (PRAVACHOL) 40 mg tablet  Take 40 mg by mouth two (2) times a day.         ?  meloxicam (MOBIC) 7.5 mg tablet  take 1 tablet by mouth once daily    0     ?  lisinopril-hydroCHLOROthiazide (PRINZIDE, ZESTORETIC) 20-12.5 mg per tablet  Take 1 Tab by mouth daily.  90 Tab  3     ?  famotidine (PEPCID) 20 mg tablet  take 1 tablet  by mouth once daily  90 Tab  3           ?  nitroglycerin (NITROSTAT) 0.4 mg SL tablet  Place 1 sl under the tongue q 5 min prn cp, max 3 sl in a 15-min time period. Call 911 if no relief after the 3rd sl.  1 Bottle  prn           ?  aspirin 81 mg chewable tablet  Take 1 Tab by mouth daily.  30 Tab  11          Allergies        Allergen  Reactions         ?  Ativan [Lorazepam]  Other (comments)             Made her feel crazy and chest pressure         ?  Ativan [Lorazepam]  Other (comments)             Chest fullness         ?  Other Medication  Palpitations             Some type of decongestant         ?  Other Medication  Other (comments)             Doesn't take decongestants, but can't recall why          Past Medical History:        Diagnosis  Date         ?  Abdominal pain  07/22/2015     ?  Anemia       ?  Back pain       ?  Bursitis of shoulder       ?  CAD (coronary artery disease)       ?  Cerumen impaction       ?  Chest pain, atypical  07/22/2015     ?  Diabetes (HCC)       ?  Diabetes mellitus out of control [facility])       ?  Endocrine disease            thyroid         ?  Fatigue       ?  Hip pain, right       ?  Hyperlipidemia, mild       ?  Hypertension       ?  Hypertension associated with diabetes (HCC)  07/22/2015     ?  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  Left carotid bruit       ?  Lumbago  07/22/2015     ?  Morbid obesity (HCC)  07/22/2015     ?  OSA on CPAP  07/22/2015     ?  Other ill-defined conditions(799.89)            cholesterol         ?  Other ill-defined conditions(799.89)            heart cath 96         ?  Shoulder pain, acute       ?  Shoulder pain, bilateral       ?  Sinusitis, acute maxillary       ?  Unstable angina (HCC)  07/06/2015     ?  Vitamin D deficiency           ?  Weakness            Past Surgical History:         Procedure  Laterality  Date          ?  HX HEENT              goiter/thyroid surgery          ?  HX THYROIDECTOMY               Family History         Problem  Relation  Age of Onset          ?  Stroke  Sister       ?  Cancer  Sister                cervical          ?  Stroke  Brother       ?  Diabetes  Other            ?  Hypertension  Other             Social History          Tobacco Use         ?  Smoking status:  Former Smoker              Last attempt to quit:  1971         Years since quitting:  48.4         ?  Smokeless tobacco:  Never Used        ?  Tobacco comment: quit in 1971       Substance Use Topics         ?  Alcohol use:  No           ROS:      Review of Systems    Constitution: Positive for weight loss. Negative for fever.    Cardiovascular: Negative for chest pain and dyspnea on exertion.    Respiratory: Negative for cough and shortness of breath.                PHYSICAL EXAM:      Visit Vitals      BP  116/60     Pulse  72     Ht  5\' 2"  (1.575 m)     Wt  193 lb 14.4 oz (88 kg)        BMI  35.46 kg/m??                 Wt Readings from Last 3 Encounters:        02/04/18  193 lb 14.4 oz (88 kg)     01/13/18  200 lb 12.8 oz (91.1 kg)        12/28/17  205 lb (93 kg)          BP Readings from Last 3 Encounters:        02/04/18  116/60     01/13/18  122/76        12/03/17  134/80              Physical Exam    Constitutional: She appears well-developed.    Cardiovascular: Normal rate and regular rhythm.    Musculoskeletal: She exhibits no edema.   Neurological: She is alert.          Medical problems and test results were reviewed with the patient today.       No results found for any visits on 02/04/18.     Lab Results         Component  Value  Date/Time            Sodium  136  01/13/2018 10:18 AM       Potassium  4.0  01/13/2018 10:18 AM       Chloride  104  01/13/2018 10:18 AM       CO2  26  01/13/2018 10:18 AM       Anion gap  6 (L)  01/13/2018 10:18 AM       Glucose  102 (H)  01/13/2018 10:18 AM       BUN  19  01/13/2018 10:18 AM       Creatinine  1.18 (H)  01/13/2018 10:18 AM       BUN/Creatinine ratio  16  09/28/2017 10:32  AM       GFR est AA  57 (L)  01/13/2018 10:18 AM       GFR est non-AA  47 (L)  01/13/2018 10:18 AM            Calcium  9.5  01/13/2018 10:18 AM          Lab Results         Component  Value  Date/Time            Cholesterol, total  148  09/28/2017 10:32 AM       HDL Cholesterol  53  09/28/2017 10:32 AM       LDL, calculated  83  09/28/2017 10:32 AM       VLDL, calculated  12  09/28/2017 10:32 AM       Triglyceride  61  09/28/2017 10:32 AM            CHOL/HDL Ratio  2.5  07/07/2015 05:15 AM              ASSESSMENT and PLAN      Diagnoses and all orders for this visit:      1. Essential hypertension overall stable on medications      2. Coronary artery disease involving native coronary artery of native heart without angina pectoris not have any symptoms symptoms recent nuclear  study this year was negative   3. Severe obesity (HCC) has lost weight but she says is not trying to      4. Dyslipidemia we will continue statin therapy.      5. Elevated sed rate she had a markedly elevated sed rate and CRP suggesting some sort of inflammation she will need to be continued work-up by hematology and her family doctor for this to try  to find that works coming from      6. Normocytic anemia she has a hemoglobin around 9 which is more normocytic.  She does need to follow-up with hematology                            Maree Krabbe, MD   02/04/2018   9:29 AM

## 2018-02-15 ENCOUNTER — Encounter

## 2018-02-16 ENCOUNTER — Inpatient Hospital Stay: Admit: 2018-02-16 | Payer: MEDICARE | Primary: Family Medicine

## 2018-02-16 ENCOUNTER — Ambulatory Visit
Admit: 2018-02-16 | Discharge: 2018-02-16 | Payer: MEDICARE | Attending: Hematology & Oncology | Primary: Family Medicine

## 2018-02-16 DIAGNOSIS — D649 Anemia, unspecified: Secondary | ICD-10-CM

## 2018-02-16 LAB — CBC WITH AUTOMATED DIFF
ABS. BASOPHILS: 0 10*3/uL (ref 0.0–0.2)
ABS. EOSINOPHILS: 0.2 10*3/uL (ref 0.0–0.8)
ABS. IMM. GRANS.: 0.1 10*3/uL (ref 0.0–0.5)
ABS. LYMPHOCYTES: 1.6 10*3/uL (ref 0.5–4.6)
ABS. MONOCYTES: 0.8 10*3/uL (ref 0.1–1.3)
ABS. NEUTROPHILS: 7.5 10*3/uL (ref 1.7–8.2)
ABSOLUTE NRBC: 0 10*3/uL (ref 0.0–0.2)
BASOPHILS: 0 % (ref 0.0–2.0)
EOSINOPHILS: 2 % (ref 0.5–7.8)
HCT: 28.5 % — ABNORMAL LOW (ref 35.8–46.3)
HGB: 8.8 g/dL — ABNORMAL LOW (ref 11.7–15.4)
IMMATURE GRANULOCYTES: 1 % (ref 0.0–5.0)
LYMPHOCYTES: 16 % (ref 13–44)
MCH: 26.2 PG (ref 26.1–32.9)
MCHC: 30.9 g/dL — ABNORMAL LOW (ref 31.4–35.0)
MCV: 84.8 FL (ref 79.6–97.8)
MONOCYTES: 8 % (ref 4.0–12.0)
MPV: 9.5 FL (ref 9.4–12.3)
NEUTROPHILS: 73 % (ref 43–78)
PLATELET: 230 10*3/uL (ref 150–450)
RBC: 3.36 M/uL — ABNORMAL LOW (ref 4.05–5.25)
RDW: 14.6 % (ref 11.9–14.6)
WBC: 10.2 10*3/uL (ref 4.3–11.1)

## 2018-02-16 LAB — METABOLIC PANEL, COMPREHENSIVE
A-G Ratio: 0.5 — ABNORMAL LOW (ref 1.2–3.5)
ALT (SGPT): 14 U/L (ref 12–65)
AST (SGOT): 10 U/L — ABNORMAL LOW (ref 15–37)
Albumin: 2.9 g/dL — ABNORMAL LOW (ref 3.2–4.6)
Alk. phosphatase: 62 U/L (ref 50–136)
Anion gap: 9 mmol/L (ref 7–16)
BUN: 23 MG/DL (ref 8–23)
Bilirubin, total: 0.5 MG/DL (ref 0.2–1.1)
CO2: 27 mmol/L (ref 21–32)
Calcium: 9 MG/DL (ref 8.3–10.4)
Chloride: 102 mmol/L (ref 98–107)
Creatinine: 1.25 MG/DL — ABNORMAL HIGH (ref 0.6–1.0)
GFR est AA: 53 mL/min/{1.73_m2} — ABNORMAL LOW (ref >60)
GFR est non-AA: 44 mL/min/{1.73_m2} — ABNORMAL LOW (ref >60)
Globulin: 5.3 g/dL — ABNORMAL HIGH (ref 2.3–3.5)
Glucose: 130 mg/dL — ABNORMAL HIGH (ref 65–100)
Potassium: 3.4 mmol/L — ABNORMAL LOW (ref 3.5–5.1)
Protein, total: 8.2 g/dL (ref 6.3–8.2)
Sodium: 138 mmol/L (ref 136–145)

## 2018-02-16 LAB — COMPREHENSIVE METABOLIC PANEL
ALT: 14 U/L (ref 12–65)
AST: 10 U/L — ABNORMAL LOW (ref 15–37)
Albumin/Globulin Ratio: 0.5 — ABNORMAL LOW (ref 1.2–3.5)
Albumin: 2.9 g/dL — ABNORMAL LOW (ref 3.2–4.6)
Alkaline Phosphatase: 62 U/L (ref 50–136)
Anion Gap: 9 mmol/L (ref 7–16)
BUN: 23 MG/DL (ref 8–23)
CO2: 27 mmol/L (ref 21–32)
Calcium: 9 MG/DL (ref 8.3–10.4)
Chloride: 102 mmol/L (ref 98–107)
Creatinine: 1.25 MG/DL — ABNORMAL HIGH (ref 0.6–1.0)
EGFR IF NonAfrican American: 44 mL/min/{1.73_m2} — ABNORMAL LOW (ref >60)
GFR African American: 53 mL/min/{1.73_m2} — ABNORMAL LOW (ref >60)
Globulin: 5.3 g/dL — ABNORMAL HIGH (ref 2.3–3.5)
Glucose: 130 mg/dL — ABNORMAL HIGH (ref 65–100)
Potassium: 3.4 mmol/L — ABNORMAL LOW (ref 3.5–5.1)
Sodium: 138 mmol/L (ref 136–145)
Total Bilirubin: 0.5 MG/DL (ref 0.2–1.1)
Total Protein: 8.2 g/dL (ref 6.3–8.2)

## 2018-02-16 LAB — CBC WITH AUTO DIFFERENTIAL
Basophils %: 0 % (ref 0.0–2.0)
Basophils Absolute: 0 10*3/uL (ref 0.0–0.2)
Eosinophils %: 2 % (ref 0.5–7.8)
Eosinophils Absolute: 0.2 10*3/uL (ref 0.0–0.8)
Granulocyte Absolute Count: 0.1 10*3/uL (ref 0.0–0.5)
Hematocrit: 28.5 % — ABNORMAL LOW (ref 35.8–46.3)
Hemoglobin: 8.8 g/dL — ABNORMAL LOW (ref 11.7–15.4)
Immature Granulocytes: 1 % (ref 0.0–5.0)
Lymphocytes %: 16 % (ref 13–44)
Lymphocytes Absolute: 1.6 10*3/uL (ref 0.5–4.6)
MCH: 26.2 PG (ref 26.1–32.9)
MCHC: 30.9 g/dL — ABNORMAL LOW (ref 31.4–35.0)
MCV: 84.8 FL (ref 79.6–97.8)
MPV: 9.5 FL (ref 9.4–12.3)
Monocytes %: 8 % (ref 4.0–12.0)
Monocytes Absolute: 0.8 10*3/uL (ref 0.1–1.3)
NRBC Absolute: 0 10*3/uL (ref 0.0–0.2)
Neutrophils %: 73 % (ref 43–78)
Neutrophils Absolute: 7.5 10*3/uL (ref 1.7–8.2)
Platelets: 230 10*3/uL (ref 150–450)
RBC: 3.36 M/uL — ABNORMAL LOW (ref 4.05–5.25)
RDW: 14.6 % (ref 11.9–14.6)
WBC: 10.2 10*3/uL (ref 4.3–11.1)

## 2018-02-16 NOTE — Progress Notes (Signed)
Data Source: Patient, [facility] record.    02/16/2018    2:56 PM    Sarah Chavez [id number]    77 y.o.      Patient Encounter: [facility] Visit    Heme Diagnosis:  Anemia  Heme History (Copied from prior):   54 female,  ex-smoker history of CAD, DM, hypothyroidism, dyslipidemia, hypertension, obese habitus, vitamin D deficiency, more recently seen by primary care and treated for UTI.  On review of labs, patient was found to have worsening isolated anemia with hemoglobin at 10.2.  MCV and RDW normal.  Other cell lines unremarkable.  Her last normal hemoglobin was on 11/18, and this has subsequently dropped over time.  Now referred to [place] for further work-up and management. Denies any blood per rectum, change in color of stools, hematuria or vaginal bleeding. Denies ever being told of anemia in the past. Continues to report ongoing abdominal discomfort and dysuria for which she will f/u w her primary care.  Interval History:  02/16/2018: Ongoing isolated anemia with hemoglobin in 8 range.  Extensive blood work from last visit reviewed: Iron stores appear adequate.  No evidence of hemolysis.  B12 and folate normal range however HC elevated in setting of normal MMA: Will start on folic acid 1 mg daily.  Unremarkable thyroid function.  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  Significantly her ESR is over 110 with CRP over 7.  Concern for underlying vasculitis: Will refer to rheumatology.  Her anemia is likely related to chronic disease.  Will also refer to GI for completion.  NCCN Distress Score:  -    REVIEW OF SYSTEMS:  As mentioned above, all other systems were reviewed in full and are negative.    Past Medical History:   Diagnosis Date   ??? Abdominal pain 07/22/2015   ??? Anemia    ??? Back pain    ??? Bursitis of shoulder    ??? CAD (coronary artery disease)    ??? Cerumen impaction     ??? Chest pain, atypical 07/22/2015   ??? Diabetes ([place])    ??? Diabetes mellitus out of control ([place])    ??? Endocrine disease     thyroid   ??? Fatigue    ??? Hip pain, right    ??? Hyperlipidemia, mild    ??? Hypertension    ??? Hypertension associated with diabetes ([place]) 07/22/2015   ??? Hypertensive pulmonary vascular disease ([place]) 07/22/2015   ??? Left carotid bruit    ??? Lumbago 07/22/2015   ??? Morbid obesity ([place]) 07/22/2015   ??? OSA on CPAP 07/22/2015   ??? Other ill-defined conditions(799.89)     cholesterol   ??? Other ill-defined conditions(799.89)     heart cath 96   ??? Shoulder pain, acute    ??? Shoulder pain, bilateral    ??? Sinusitis, acute maxillary    ??? Unstable angina ([place]) 07/06/2015   ??? Vitamin D deficiency    ??? Weakness        Past Surgical History:   Procedure Laterality Date   ??? HX HEENT      goiter/thyroid surgery   ??? HX THYROIDECTOMY         Current Outpatient Medications   Medication Sig Dispense Refill   ??? pravastatin (PRAVACHOL) 80 mg tablet TK 1 T PO QPM  1   ??? meloxicam (MOBIC) 7.5 mg tablet take 1 tablet by mouth once daily  0   ???  lisinopril-hydroCHLOROthiazide (PRINZIDE, ZESTORETIC) 20-12.5 mg per tablet Take 1 Tab by mouth daily. 90 Tab 3   ??? famotidine (PEPCID) 20 mg tablet take 1 tablet by mouth once daily 90 Tab 3   ??? nitroglycerin (NITROSTAT) 0.4 mg SL tablet Place 1 sl under the tongue q 5 min prn cp, max 3 sl in a 15-min time period. Call 911 if no relief after the 3rd sl. 1 Bottle prn   ??? aspirin 81 mg chewable tablet Take 1 Tab by mouth daily. 30 Tab 11       Social History     Socioeconomic History   ??? Marital status: SINGLE     Spouse name: Not on file   ??? Number of children: Not on file   ??? Years of education: Not on file   ??? Highest education level: Not on file   Tobacco Use   ??? Smoking status: Former Smoker     Last attempt to quit: 1971     Years since quitting: 48.4   ??? Smokeless tobacco: Never Used   ??? Tobacco comment: quit in 1971   Substance and Sexual Activity   ??? Alcohol use: No    ??? Drug use: No   Social History Narrative    ** Merged History Encounter **            Family History   Problem Relation Age of Onset   ??? Stroke Sister    ??? Cancer Sister         cervical   ??? Stroke Brother    ??? Diabetes Other    ??? Hypertension Other        Allergies   Allergen Reactions   ??? Ativan [Lorazepam] Other (comments)     Made her feel crazy and chest pressure   ??? Ativan [Lorazepam] Other (comments)     Chest fullness   ??? Other Medication Palpitations     Some type of decongestant   ??? Other Medication Other (comments)     Doesn't take decongestants, but can't recall why       PHYSICAL EXAMINATION:  General Appearance: Healthy appearing patient in no acute distress  Vitals reviewed.   Visit Vitals  BP 133/56 (BP 1 Location: Left arm, BP Patient Position: Sitting)   Pulse 61   Temp 98.1 ??F (36.7 ??C) (Oral)   Resp 16   Ht '5\' 2"'  (1.575 m)   Wt 196 lb (88.9 kg)   SpO2 100%   BMI 35.85 kg/m??     HEENT: No oral or pharyngeal masses, ulceration or thrush noted, no sinus tenderness.  Neck is supple with no thyromegaly or JVD noted.  Lymph Nodes: No lymphadenopathy noted in the occipital, pre and post auricular, cervical, supra and infraclavicular, axillary, epitrochlear, inguinal, and popliteal region.  Breasts: No palpable masses, nipple discharge or skin retraction  Lungs/Thorax: Clear to auscultation, no accessory muscles of respiration being used.  Heart: Regular rate and rhythm, normal S1, S2, no appreciable murmurs, rubs, gallops  Abdomen: Soft, nontender, bowel sounds present, no appreciable hepatosplenomegaly, no palpable masses  Extremeties: Good pulses bilaterally, no peripheral edema.  Skin: Normal skin tone with no rash, petechiae, ecchymosis noted.  Musculoskeletal: No pain on palpation over bony prominence, no edema, no evidence of gout, no joint or bony deformity  Neurologic: Grossly intact        LABS/IMAGING:    Lab Results   Component Value Date/Time    WBC 10.2 02/16/2018 01:47 PM  HGB 8.8 (L) 02/16/2018 01:47 PM    HCT 28.5 (L) 02/16/2018 01:47 PM    PLATELET 230 02/16/2018 01:47 PM    MCV 84.8 02/16/2018 01:47 PM       Lab Results   Component Value Date/Time    Sodium 138 02/16/2018 01:47 PM    Potassium 3.4 (L) 02/16/2018 01:47 PM    Chloride 102 02/16/2018 01:47 PM    CO2 27 02/16/2018 01:47 PM    Anion gap 9 02/16/2018 01:47 PM    Glucose 130 (H) 02/16/2018 01:47 PM    BUN 23 02/16/2018 01:47 PM    Creatinine 1.25 (H) 02/16/2018 01:47 PM    BUN/Creatinine ratio 16 09/28/2017 10:32 AM    GFR est AA 53 (L) 02/16/2018 01:47 PM    GFR est non-AA 44 (L) 02/16/2018 01:47 PM    Calcium 9.0 02/16/2018 01:47 PM    AST (SGOT) 10 (L) 02/16/2018 01:47 PM    Alk. phosphatase 62 02/16/2018 01:47 PM    Protein, total 8.2 02/16/2018 01:47 PM    Albumin 2.9 (L) 02/16/2018 01:47 PM    Globulin 5.3 (H) 02/16/2018 01:47 PM    A-G Ratio 0.5 (L) 02/16/2018 01:47 PM    ALT (SGPT) 14 02/16/2018 01:47 PM             Above results reviewed with patient.    ASSESSMENT:  52 female,  ex-smoker history of CAD, DM, hypothyroidism, dyslipidemia, hypertension, obese habitus, vitamin D deficiency, more recently seen by primary care and treated for UTI.  On review of labs, patient was found to have worsening isolated anemia with hemoglobin at 10.2.  MCV and RDW normal.  Other cell lines unremarkable.  Her last normal hemoglobin was on 11/18, and this has subsequently dropped over time.  Now referred to [place] for further work-up and management. Denies any blood per rectum, change in color of stools, hematuria or vaginal bleeding. Denies ever being told of anemia in the past. Continues to report ongoing abdominal discomfort and dysuria for which she will f/u w her primary care.    Will initiate w/u as below:    02/16/2018: Ongoing isolated anemia with hemoglobin in 8 range.  Extensive blood work from last visit reviewed: Iron stores appear adequate.  Denies  bleeding. No evidence of hemolysis.  B12 and folate normal range however HC elevated in setting of normal MMA: Will start on folic acid 1 mg daily.  Unremarkable thyroid function.  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  Significantly her ESR is over 110 with CRP over 7.  Concern for underlying vasculitis: Will refer to rheumatology.  Her anemia is likely related to chronic disease.  Will also refer to GI for completion.        1. 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 Folic acid deficiency (High HC, normal MMA)  3. 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 Refer to rheumatology. Start folic acid  - Refer to GI for completion. Also needs Gyn eval    RTC in approx 3 months w labs, iron panel, ESR, rheumatology records.       Total time 25 min 50% in direct consultation about the patient's diagnosis and management. All questions answered.  Letha Cape, MD  [facility]  [facility] [address]  [address] [place] [address]  Office : [phone]  Fax : [phone]

## 2018-02-16 NOTE — Patient Instructions (Signed)
Patient Instructions from Today's Visit  Reason for Visit:  Follow up    Plan:  The lab work we did showed that you are folic acid deficient. We want you to start taking folic acid 1 mg daily.     The lab work also shows that you have a lot of inflammation going on in your body. This can cause be the reason for your anemia. We will refer you to a rheumatologist.     Follow Up:  Follow up in 2-3 months    Recent Lab Results:  Recent Results (from the past 12 hour(s))   CBC WITH AUTOMATED DIFF    Collection Time: 02/16/18  1:47 PM   Result Value Ref Range    WBC 10.2 4.3 - 11.1 K/uL    RBC 3.36 (L) 4.05 - 5.25 M/uL    HGB 8.8 (L) 11.7 - 15.4 g/dL    HCT 28.5 (L) 35.8 - 46.3 %    MCV 84.8 79.6 - 97.8 FL    MCH 26.2 26.1 - 32.9 PG    MCHC 30.9 (L) 31.4 - 35.0 g/dL    RDW 14.6 11.9 - 14.6 %    PLATELET 230 150 - 450 K/uL    MPV 9.5 9.4 - 12.3 FL    ABSOLUTE NRBC 0.00 0.0 - 0.2 K/uL    DF AUTOMATED      NEUTROPHILS 73 43 - 78 %    LYMPHOCYTES 16 13 - 44 %    MONOCYTES 8 4.0 - 12.0 %    EOSINOPHILS 2 0.5 - 7.8 %    BASOPHILS 0 0.0 - 2.0 %    IMMATURE GRANULOCYTES 1 0.0 - 5.0 %    ABS. NEUTROPHILS 7.5 1.7 - 8.2 K/UL    ABS. LYMPHOCYTES 1.6 0.5 - 4.6 K/UL    ABS. MONOCYTES 0.8 0.1 - 1.3 K/UL    ABS. EOSINOPHILS 0.2 0.0 - 0.8 K/UL    ABS. BASOPHILS 0.0 0.0 - 0.2 K/UL    ABS. IMM. GRANS. 0.1 0.0 - 0.5 K/UL   METABOLIC PANEL, COMPREHENSIVE    Collection Time: 02/16/18  1:47 PM   Result Value Ref Range    Sodium 138 136 - 145 mmol/L    Potassium 3.4 (L) 3.5 - 5.1 mmol/L    Chloride 102 98 - 107 mmol/L    CO2 27 21 - 32 mmol/L    Anion gap 9 7 - 16 mmol/L    Glucose 130 (H) 65 - 100 mg/dL    BUN 23 8 - 23 MG/DL    Creatinine 1.25 (H) 0.6 - 1.0 MG/DL    GFR est AA 53 (L) >60 ml/min/1.54m    GFR est non-AA 44 (L) >60 ml/min/1.748m   Calcium 9.0 8.3 - 10.4 MG/DL    Bilirubin, total 0.5 0.2 - 1.1 MG/DL    ALT (SGPT) 14 12 - 65 U/L    AST (SGOT) 10 (L) 15 - 37 U/L    Alk. phosphatase 62 50 - 136 U/L     Protein, total 8.2 6.3 - 8.2 g/dL    Albumin 2.9 (L) 3.2 - 4.6 g/dL    Globulin 5.3 (H) 2.3 - 3.5 g/dL    A-G Ratio 0.5 (L) 1.2 - 3.5       Treatment Summary has been discussed and given to patient: n/a        -------------------------------------------------------------------------------------------------------------------  Please call our office at ([phone] you have any  of the following symptoms:   ??  Fever of 100.5 or greater  ?? Chills  ?? Shortness of breath  ?? Swelling or pain in one leg    After office hours an answering service is available and will contact a provider for emergencies or if you are experiencing any of the above symptoms.    ? Patient did express an interest in My Chart.  My Chart log in information explained on the after visit summary printout at the [place] desk.    Loel Lofty, RN

## 2018-02-16 NOTE — Progress Notes (Signed)
Data Source: Patient, [facility] record.    02/16/2018    2:56 PM    Sarah Chavez [id number]    77 y.o.      Patient Encounter: [facility] Visit    Heme Diagnosis:  Anemia  Heme History (Copied from prior):   34 female,  ex-smoker history of CAD, DM, hypothyroidism, dyslipidemia, hypertension, obese habitus, vitamin D deficiency, more recently seen by primary care and treated for UTI.  On review of labs, patient was found to have worsening isolated anemia with hemoglobin at 10.2.  MCV and RDW normal.  Other cell lines unremarkable.  Her last normal hemoglobin was on 11/18, and this has subsequently dropped over time.  Now referred to [place] for further work-up and management. Denies any blood per rectum, change in color of stools, hematuria or vaginal bleeding. Denies ever being told of anemia in the past. Continues to report ongoing abdominal discomfort and dysuria for which she will f/u w her primary care.  Interval History:  02/16/2018: Ongoing isolated anemia with hemoglobin in 8 range.  Extensive blood work from last visit reviewed: Iron stores appear adequate.  No evidence of hemolysis.  B12 and folate normal range however HC elevated in setting of normal MMA: Will start on folic acid 1 mg daily.  Unremarkable thyroid function.  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  Significantly her ESR is over 110 with CRP over 7.  Concern for underlying vasculitis: Will refer to rheumatology.  Her anemia is likely related to chronic disease.  Will also refer to GI for completion.  NCCN Distress Score:  -    REVIEW OF SYSTEMS:  As mentioned above, all other systems were reviewed in full and are negative.    Past Medical History:   Diagnosis Date   ??? Abdominal pain 07/22/2015   ??? Anemia    ??? Back pain    ??? Bursitis of shoulder    ??? CAD (coronary artery disease)    ??? Cerumen impaction    ??? Chest pain, atypical  07/22/2015   ??? Diabetes ([place])    ??? Diabetes mellitus out of control ([place])    ??? Endocrine disease     thyroid   ??? Fatigue    ??? Hip pain, right    ??? Hyperlipidemia, mild    ??? Hypertension    ??? Hypertension associated with diabetes ([place]) 07/22/2015   ??? 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 Left carotid bruit    ??? Lumbago 07/22/2015   ??? Morbid obesity ([place]) 07/22/2015   ??? OSA on CPAP 07/22/2015   ??? Other ill-defined conditions(799.89)     cholesterol   ??? Other ill-defined conditions(799.89)     heart cath 96   ??? Shoulder pain, acute    ??? Shoulder pain, bilateral    ??? Sinusitis, acute maxillary    ??? Unstable angina ([place]) 07/06/2015   ??? Vitamin D deficiency    ??? Weakness        Past Surgical History:   Procedure Laterality Date   ??? HX HEENT      goiter/thyroid surgery   ??? HX THYROIDECTOMY         Current Outpatient Medications   Medication Sig Dispense Refill   ??? pravastatin (PRAVACHOL) 80 mg tablet TK 1 T PO QPM  1   ??? meloxicam (MOBIC) 7.5 mg tablet take 1 tablet by mouth once daily  0   ???  lisinopril-hydroCHLOROthiazide (PRINZIDE, ZESTORETIC) 20-12.5 mg per tablet Take 1 Tab by mouth daily. 90 Tab 3   ??? famotidine (PEPCID) 20 mg tablet take 1 tablet by mouth once daily 90 Tab 3   ??? nitroglycerin (NITROSTAT) 0.4 mg SL tablet Place 1 sl under the tongue q 5 min prn cp, max 3 sl in a 15-min time period. Call 911 if no relief after the 3rd sl. 1 Bottle prn   ??? aspirin 81 mg chewable tablet Take 1 Tab by mouth daily. 30 Tab 11       Social History     Socioeconomic History   ??? Marital status: SINGLE     Spouse name: Not on file   ??? Number of children: Not on file   ??? Years of education: Not on file   ??? Highest education level: Not on file   Tobacco Use   ??? Smoking status: Former Smoker     Last attempt to quit: 1971     Years since quitting: 48.4   ??? Smokeless tobacco: Never Used   ??? Tobacco comment: quit in 1971   Substance and Sexual Activity   ??? Alcohol use: No   ??? Drug use: No   Social History  Narrative    ** Merged History Encounter **            Family History   Problem Relation Age of Onset   ??? Stroke Sister    ??? Cancer Sister         cervical   ??? Stroke Brother    ??? Diabetes Other    ??? Hypertension Other        Allergies   Allergen Reactions   ??? Ativan [Lorazepam] Other (comments)     Made her feel crazy and chest pressure   ??? Ativan [Lorazepam] Other (comments)     Chest fullness   ??? Other Medication Palpitations     Some type of decongestant   ??? Other Medication Other (comments)     Doesn't take decongestants, but can't recall why       PHYSICAL EXAMINATION:  General Appearance: Healthy appearing patient in no acute distress  Vitals reviewed.   Visit Vitals  BP 133/56 (BP 1 Location: Left arm, BP Patient Position: Sitting)   Pulse 61   Temp 98.1 ??F (36.7 ??C) (Oral)   Resp 16   Ht 5' 2"  (1.575 m)   Wt 196 lb (88.9 kg)   SpO2 100%   BMI 35.85 kg/m??     HEENT: No oral or pharyngeal masses, ulceration or thrush noted, no sinus tenderness.  Neck is supple with no thyromegaly or JVD noted.  Lymph Nodes: No lymphadenopathy noted in the occipital, pre and post auricular, cervical, supra and infraclavicular, axillary, epitrochlear, inguinal, and popliteal region.  Breasts: No palpable masses, nipple discharge or skin retraction  Lungs/Thorax: Clear to auscultation, no accessory muscles of respiration being used.  Heart: Regular rate and rhythm, normal S1, S2, no appreciable murmurs, rubs, gallops  Abdomen: Soft, nontender, bowel sounds present, no appreciable hepatosplenomegaly, no palpable masses  Extremeties: Good pulses bilaterally, no peripheral edema.  Skin: Normal skin tone with no rash, petechiae, ecchymosis noted.  Musculoskeletal: No pain on palpation over bony prominence, no edema, no evidence of gout, no joint or bony deformity  Neurologic: Grossly intact        LABS/IMAGING:    Lab Results   Component Value Date/Time    WBC 10.2 02/16/2018 01:47 PM  HGB 8.8 (L) 02/16/2018 01:47 PM    HCT 28.5  (L) 02/16/2018 01:47 PM    PLATELET 230 02/16/2018 01:47 PM    MCV 84.8 02/16/2018 01:47 PM       Lab Results   Component Value Date/Time    Sodium 138 02/16/2018 01:47 PM    Potassium 3.4 (L) 02/16/2018 01:47 PM    Chloride 102 02/16/2018 01:47 PM    CO2 27 02/16/2018 01:47 PM    Anion gap 9 02/16/2018 01:47 PM    Glucose 130 (H) 02/16/2018 01:47 PM    BUN 23 02/16/2018 01:47 PM    Creatinine 1.25 (H) 02/16/2018 01:47 PM    BUN/Creatinine ratio 16 09/28/2017 10:32 AM    GFR est AA 53 (L) 02/16/2018 01:47 PM    GFR est non-AA 44 (L) 02/16/2018 01:47 PM    Calcium 9.0 02/16/2018 01:47 PM    AST (SGOT) 10 (L) 02/16/2018 01:47 PM    Alk. phosphatase 62 02/16/2018 01:47 PM    Protein, total 8.2 02/16/2018 01:47 PM    Albumin 2.9 (L) 02/16/2018 01:47 PM    Globulin 5.3 (H) 02/16/2018 01:47 PM    A-G Ratio 0.5 (L) 02/16/2018 01:47 PM    ALT (SGPT) 14 02/16/2018 01:47 PM             Above results reviewed with patient.    ASSESSMENT:  57 female,  ex-smoker history of CAD, DM, hypothyroidism, dyslipidemia, hypertension, obese habitus, vitamin D deficiency, more recently seen by primary care and treated for UTI.  On review of labs, patient was found to have worsening isolated anemia with hemoglobin at 10.2.  MCV and RDW normal.  Other cell lines unremarkable.  Her last normal hemoglobin was on 11/18, and this has subsequently dropped over time.  Now referred to [place] for further work-up and management. Denies any blood per rectum, change in color of stools, hematuria or vaginal bleeding. Denies ever being told of anemia in the past. Continues to report ongoing abdominal discomfort and dysuria for which she will f/u w her primary care.    Will initiate w/u as below:    02/16/2018: Ongoing isolated anemia with hemoglobin in 8 range.  Extensive blood work from last visit reviewed: Iron stores appear adequate.  Denies bleeding. No evidence of hemolysis.  B12 and folate normal range however HC elevated in setting of normal MMA: Will  start on folic acid 1 mg daily.  Unremarkable thyroid function.  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  Significantly her ESR is over 110 with CRP over 7.  Concern for underlying vasculitis: Will refer to rheumatology.  Her anemia is likely related to chronic disease.  Will also refer to GI for completion.        1. 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 Folic acid deficiency (High HC, normal MMA)  3. 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 Refer to rheumatology. Start folic acid  - Refer to GI for completion. Also needs Gyn eval    RTC in approx 3 months w labs, iron panel, ESR, rheumatology records.       Total time 25 min 50% in direct consultation about the patient's diagnosis and management. All questions answered.  Letha Cape, MD  [facility]  [facility]  [address] [place] [address]  Office : [phone]  Fax : [phone]

## 2018-02-24 ENCOUNTER — Inpatient Hospital Stay: Primary: Family Medicine

## 2018-02-25 NOTE — Other (Signed)
Patient verified name&  DOB. Order to obtain consent not found in EHR.    Type 1A surgery, phone assessment complete.  Orders not received.    Labs per surgeon: none  Labs per anesthesia protocol: POC glucose signed and held DOS    Patient answered medical/surgical history questions at their best of ability. All prior to admission medications reviewed with patient and documented in [facility].    Patient instructed to take ONLY THE FOLLOWING MEDICATIONS ON THE DAY OF SURGERY according to anesthesia guidelines with sips of water: Aspirin and Pepcid.      Pt verbalizes understanding to HOLD THE FOLLOWING MEDICATIONS: Meloxicam 5 days    PT VERBALIZES UNDERSTANDING TO HOLD ALL VITAMINS AND SUPPLEMENTS 7 DAYS PRIOR TO SURGERY DATE (with exception to Renal Vitamins) and NSAIDS 5 DAYS PRIOR TO SURGERY DATE PER ANESTHESIA PROTOCOL.    Patient instructed on the following:  Arrive at main Entrance, time of arrival to be called the day before by 1700.  NPO after midnight including gum, mints, tobacco and ice chips.  Responsible adult must drive patient to the hospital, stay during surgery, and patient requires supervision 24 hours after anesthesia  Use antibacterial soap in shower the night before surgery and on the morning of surgery.  Leave all valuables (money and jewelry) at home but bring insurance card and ID on DOS.  Do not wear make-up, nail polish, lotions, cologne, perfumes, powders, or oil on skin.    Patient teach back successful and patient demonstrates knowledge of instruction.    If you do not receive a call with your arrival time by 5pm the business day prior to surgery, please call [phone].

## 2018-02-25 NOTE — Interval H&P Note (Signed)
Patient verified name&  DOB. Order to obtain consent not found in EHR.    Type 1A surgery, phone assessment complete.  Orders not received.    Labs per surgeon: none  Labs per anesthesia protocol: POC glucose signed and held DOS    Patient answered medical/surgical history questions at their best of ability. All prior to admission medications reviewed with patient and documented in [facility].    Patient instructed to take ONLY THE FOLLOWING MEDICATIONS ON THE DAY OF SURGERY according to anesthesia guidelines with sips of water: Aspirin and Pepcid.      Pt verbalizes understanding to HOLD THE FOLLOWING MEDICATIONS: Meloxicam 5 days    PT VERBALIZES UNDERSTANDING TO HOLD ALL VITAMINS AND SUPPLEMENTS 7 DAYS PRIOR TO SURGERY DATE (with exception to Renal Vitamins) and NSAIDS 5 DAYS PRIOR TO SURGERY DATE PER ANESTHESIA PROTOCOL.    Patient instructed on the following:  Arrive at main Entrance, time of arrival to be called the day before by 1700.  NPO after midnight including gum, mints, tobacco and ice chips.  Responsible adult must drive patient to the hospital, stay during surgery, and patient requires supervision 24 hours after anesthesia  Use antibacterial soap in shower the night before surgery and on the morning of surgery.  Leave all valuables (money and jewelry) at home but bring insurance card and ID on DOS.  Do not wear make-up, nail polish, lotions, cologne, perfumes, powders, or oil on skin.    Patient teach back successful and patient demonstrates knowledge of instruction.    If you do not receive a call with your arrival time by 5pm the business day prior to surgery, please call [phone].

## 2018-03-01 ENCOUNTER — Inpatient Hospital Stay: Payer: MEDICARE

## 2018-03-01 LAB — GLUCOSE, POC: Glucose (POC): 103 mg/dL — ABNORMAL HIGH (ref 65–100)

## 2018-03-01 LAB — POCT GLUCOSE: POC Glucose: 103 mg/dL — ABNORMAL HIGH (ref 65–100)

## 2018-03-01 MED ORDER — SODIUM CHLORIDE 0.9 % INJECTION
10 mg/mL | INTRAMUSCULAR | Status: DC | PRN
Start: 2018-03-01 — End: 2018-03-01
  Administered 2018-03-01 (×4): via INTRAVENOUS

## 2018-03-01 MED ORDER — LACTATED RINGERS IV
INTRAVENOUS | Status: DC
Start: 2018-03-01 — End: 2018-03-01
  Administered 2018-03-01: 15:00:00 via INTRAVENOUS

## 2018-03-01 MED ORDER — PROPOFOL 10 MG/ML IV EMUL
10 mg/mL | INTRAVENOUS | Status: DC | PRN
Start: 2018-03-01 — End: 2018-03-01
  Administered 2018-03-01 (×2): via INTRAVENOUS

## 2018-03-01 MED ORDER — GLYCOPYRROLATE 0.2 MG/ML IJ SOLN
0.2 mg/mL | INTRAMUSCULAR | Status: DC | PRN
Start: 2018-03-01 — End: 2018-03-01
  Administered 2018-03-01: 16:00:00 via INTRAVENOUS

## 2018-03-01 MED ORDER — LIDOCAINE (PF) 20 MG/ML (2 %) IJ SOLN
20 mg/mL (2 %) | INTRAMUSCULAR | Status: DC | PRN
Start: 2018-03-01 — End: 2018-03-01
  Administered 2018-03-01: 16:00:00 via INTRAVENOUS

## 2018-03-01 MED ORDER — PROPOFOL 10 MG/ML IV EMUL
10 mg/mL | INTRAVENOUS | Status: DC | PRN
Start: 2018-03-01 — End: 2018-03-01
  Administered 2018-03-01: 16:00:00 via INTRAVENOUS

## 2018-03-01 MED FILL — PHENYLEPHRINE 10 MG/ML INJECTION: 10 mg/mL | INTRAMUSCULAR | Qty: 400

## 2018-03-01 MED FILL — PROPOFOL 10 MG/ML IV EMUL: 10 mg/mL | INTRAVENOUS | Qty: 490

## 2018-03-01 MED FILL — GLYCOPYRROLATE 0.2 MG/ML IJ SOLN: 0.2 mg/mL | INTRAMUSCULAR | Qty: 0.2

## 2018-03-01 MED FILL — XYLOCAINE-MPF 20 MG/ML (2 %) INJECTION SOLUTION: 20 mg/mL (2 %) | INTRAMUSCULAR | Qty: 35

## 2018-03-01 NOTE — Progress Notes (Signed)
Report given to Rachel, RN. VSS.

## 2018-03-01 NOTE — Other (Signed)
VSS. Discharge instructions reviewed with patient and family member and copy of instructions sent home with patient. Dr. Marisa SprinklesMazanec spoke with patient and family prior to discharge.  Questions answered. Discharged via personal vehicle, wheeled out by staff member. IV discontinued prior to discharge.     Personal items with patient at discharge: all clothing shoes personal belongings.

## 2018-03-01 NOTE — Progress Notes (Signed)
Blood sugar in pre-op 103

## 2018-03-01 NOTE — H&P (Signed)
Gastroenterology Outpatient History and Physical    Patient: Sarah Chavez    Physician: Dionne Milo, MD    Vital Signs: Blood pressure 144/64, pulse 84, temperature 98.9 ??F (37.2 ??C), resp. rate 17, height 5\' 2"  (1.575 m), weight 87.5 kg (193 lb), SpO2 100 %, not currently breastfeeding.    Allergies:   Allergies   Allergen Reactions   ??? Ativan [Lorazepam] Other (comments)     Made her feel crazy and chest pressure   ??? Ativan [Lorazepam] Other (comments)     Chest fullness   ??? Other Medication Palpitations     Some type of decongestant   ??? Other Medication Other (comments)     Doesn't take decongestants, but can't recall why       Chief Complaint: GERD, abdominal pain and CRC screening  History of Present Illness: As Above    Justification for Procedure: As Above    History:  Past Medical History:   Diagnosis Date   ??? Abdominal pain 07/22/2015   ??? Anemia    ??? Back pain    ??? Bursitis of shoulder    ??? CAD (coronary artery disease)    ??? Cerumen impaction    ??? Chest pain, atypical 07/22/2015   ??? Diabetes ([facility])     no meds, A1c 09/28/17 6.6; avg bs 105; denies ss of hypo   ??? Diabetes mellitus out of control ([facility])    ??? Endocrine disease     thyroid   ??? Fatigue    ??? GERD (gastroesophageal reflux disease)     controlled with medicaitons   ??? Hip pain, right    ??? Hyperlipidemia, mild    ??? Hypertension     managed with medication   ??? Hypertension associated with diabetes ([facility]) 07/22/2015   ??? 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 Left carotid bruit    ??? Lumbago 07/22/2015   ??? Morbid obesity ([facility]) 07/22/2015    BMI 35.3   ??? OSA on CPAP 07/22/2015    uses CPAP   ??? Other ill-defined conditions(799.89)     cholesterol   ??? Other ill-defined conditions(799.89)     heart cath 96   ??? Shoulder pain, acute    ??? Shoulder pain, bilateral    ??? Sinusitis, acute maxillary    ??? Unstable angina ([facility]) 07/06/2015   ??? Vitamin D deficiency    ??? Weakness       Past Surgical History:   Procedure Laterality Date    ??? HX COLONOSCOPY     ??? HX HEART CATHETERIZATION  2016    without intervention   ??? HX HEENT      goiter/thyroid surgery   ??? HX THYROIDECTOMY        Social History     Socioeconomic History   ??? Marital status: SINGLE     Spouse name: Not on file   ??? Number of children: Not on file   ??? Years of education: Not on file   ??? Highest education level: Not on file   Tobacco Use   ??? Smoking status: Former Smoker     Last attempt to quit: 1971     Years since quitting: 48.5   ??? Smokeless tobacco: Never Used   ??? Tobacco comment: quit in 1971   Substance and Sexual Activity   ??? Alcohol use: No   ??? Drug use: No   Social History Narrative    ** Merged History Encounter **  Family History   Problem Relation Age of Onset   ??? Stroke Sister    ??? Cancer Sister         cervical   ??? Stroke Brother    ??? Diabetes Other    ??? Hypertension Other        Medications:   Prior to Admission medications    Medication Sig Start Date End Date Taking? Authorizing Provider   pravastatin (PRAVACHOL) 80 mg tablet TK 1 T PO QPM 01/17/18  Yes Provider, Historical   lisinopril-hydroCHLOROthiazide (PRINZIDE, ZESTORETIC) 20-12.5 mg per tablet Take 1 Tab by mouth daily. 07/06/17  Yes Delana MeyerNewman, Steven M, MD   famotidine (PEPCID) 20 mg tablet take 1 tablet by mouth once daily 07/06/17  Yes Delana MeyerNewman, Steven M, MD   aspirin 81 mg chewable tablet Take 1 Tab by mouth daily. 07/08/15  Yes Nessmith, Rhona RaiderMatthew G, MD   meloxicam [facility](MOBIC) 7.5 mg tablet take 1 tablet by mouth once daily 09/28/17   Provider, Historical   nitroglycerin (NITROSTAT) 0.4 mg SL tablet Place 1 sl under the tongue q 5 min prn cp, max 3 sl in a 15-min time period. Call 911 if no relief after the 3rd sl. 02/19/17   Maree KrabbeFreeman, Ned D, MD       Physical Exam:         Heart: regular rate and rhythm, S1, S2 normal, no murmur, click, rub or gallop   Lungs: chest clear, no wheezing, rales, normal symmetric air entry, Heart exam - S1, S2 normal, no murmur, no gallop, rate regular    Abdominal: Bowel sounds are normal, liver is not enlarged, spleen is not enlarged            Findings/Diagnosis: GERD, abdominal pain, CRC screen        Signed:  Dionne MiloPaul A Cheyeanne Roadcap, MD 03/01/2018

## 2018-03-01 NOTE — Anesthesia Post-Procedure Evaluation (Signed)
Procedure(s):  ESOPHAGOGASTRODUODENOSCOPY (EGD)  COLONOSCOPY/BMI 39  ESOPHAGOGASTRODUODENAL (EGD) BIOPSY  ENDOSCOPIC POLYPECTOMY.    total IV anesthesia    Anesthesia Post Evaluation        Patient location during evaluation: PACU  Patient participation: complete - patient participated  Level of consciousness: sleepy but conscious  Pain management: adequate  Airway patency: patent  Anesthetic complications: no  Cardiovascular status: acceptable  Respiratory status: acceptable  Hydration status: acceptable  Post anesthesia nausea and vomiting:  none      Vitals Value Taken Time   BP 99/52 03/01/2018 12:38 PM   Temp 36 ??C (96.8 ??F) 03/01/2018 12:27 PM   Pulse 81 03/01/2018 12:41 PM   Resp 18 03/01/2018 12:38 PM   SpO2 95 % 03/01/2018 12:41 PM   Vitals shown include unvalidated device data.

## 2018-03-01 NOTE — Procedures (Signed)
Colonoscopy Procedure Note    Indications:  CRC screening and abdominal pain    Anesthesia/Sedation: TIVA    Pre-Procedure Physical:  Current Facility-Administered Medications   Medication Dose Route Frequency   ??? lactated Ringers infusion  100 mL/hr IntraVENous CONTINUOUS     Facility-Administered Medications Ordered in Other Encounters   Medication Dose Route Frequency   ??? propofol (DIPRIVAN) 10 mg/mL injection    PRN   ??? propofol (DIPRIVAN) 10 mg/mL injection    CONTINUOUS   ??? lidocaine (PF) (XYLOCAINE) 20 mg/mL (2 %) injection   IntraVENous PRN   ??? glycopyrrolate (ROBINUL) injection   IntraVENous PRN   ??? PHENYLephrine 100 mcg/mL 10 mL syringe (ONE-STEP)   IntraVENous CONTINUOUS      Ativan [lorazepam]; Ativan [lorazepam]; Other medication; and Other medication    Patient Vitals for the past 8 hrs:   BP Temp Pulse Resp SpO2 Height Weight   03/01/18 1146 164/76 ??? 88 16 99 % ??? ???   03/01/18 1040 144/64 ??? 84 17 100 % ??? ???   03/01/18 1019 ??? 98.9 ??F (37.2 ??C) ??? ??? ??? 5\' 2"  (1.575 m) 87.5 kg (193 lb)       Exam:    Airway: clear   Heart: normal S1and S2    Lungs: clear bilateral  Abdomen: soft, nontender, bowel sounds present and normal in all quads   Mental Status: awake, alert and oriented to person, place and time        Procedure Details      Informed consent was obtained for the procedure, including sedation.  Risks of perforation, hemorrhage, adverse drug reaction and aspiration were discussed. The patient was placed in the left lateral decubitus position.  Based on the pre-procedure assessment, including review of the patient's medical history, medications, allergies, and review of systems, she had been deemed to be an appropriate candidate for conscious sedation; she was therefore sedated with the medications listed below.   The patient was monitored continuously with ECG tracing, pulse oximetry, blood pressure monitoring, and direct observations.       A rectal examination was performed. The colonoscope was inserted into the rectum and advanced under direct vision to the cecum. The quality of the colonic preparation was good. A careful inspection was made as the colonoscope was withdrawn, including a retroflexed view of the rectum; findings and interventions are described below. Appropriate photodocumentation was obtained.    Findings:  Small 2+ internal hemorrhoids seen on retroflex in the rectum, a few sigmoid diverticula and an AC 3 mm sessile polyp that was cold biopsied away.  No source for pain was identified.    Specimens: AC polyp    Estimated Blood Loss: Minimal           Complications: None; patient tolerated the procedure well.           Attending Attestation: I performed the procedure.    Impression: An AC polyp, sigmoid diverticulosis and internal hemorrhoids.  No source for pain was identified.     Piedad ClimesP. Bascom Biel, MD    Recommendations:

## 2018-03-01 NOTE — Progress Notes (Signed)
Report given to Fleet Contras, RN. VSS.

## 2018-03-01 NOTE — H&P (Signed)
Gastroenterology Outpatient History and Physical    Patient: Sarah Chavez    Physician: Dionne MiloPaul A Jennika Ringgold, MD    Vital Signs: Blood pressure 144/64, pulse 84, temperature 98.9 ??F (37.2 ??C), resp. rate 17, height 5\' 2"  (1.575 m), weight 87.5 kg (193 lb), SpO2 100 %, not currently breastfeeding.    Allergies:   Allergies   Allergen Reactions   ??? Ativan [Lorazepam] Other (comments)     Made her feel crazy and chest pressure   ??? Ativan [Lorazepam] Other (comments)     Chest fullness   ??? Other Medication Palpitations     Some type of decongestant   ??? Other Medication Other (comments)     Doesn't take decongestants, but can't recall why       Chief Complaint: GERD, abdominal pain and CRC screening  History of Present Illness: As Above    Justification for Procedure: As Above    History:  Past Medical History:   Diagnosis Date   ??? Abdominal pain 07/22/2015   ??? Anemia    ??? Back pain    ??? Bursitis of shoulder    ??? CAD (coronary artery disease)    ??? Cerumen impaction    ??? Chest pain, atypical 07/22/2015   ??? Diabetes (HCC)     no meds, A1c 09/28/17 6.6; avg bs 105; denies ss of hypo   ??? Diabetes mellitus out of control (HCC)    ??? Endocrine disease     thyroid   ??? Fatigue    ??? GERD (gastroesophageal reflux disease)     controlled with medicaitons   ??? Hip pain, right    ??? Hyperlipidemia, mild    ??? Hypertension     managed with medication   ??? Hypertension associated with diabetes (HCC) 07/22/2015   ??? 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 Left carotid bruit    ??? Lumbago 07/22/2015   ??? Morbid obesity (HCC) 07/22/2015    BMI 35.3   ??? OSA on CPAP 07/22/2015    uses CPAP   ??? Other ill-defined conditions(799.89)     cholesterol   ??? Other ill-defined conditions(799.89)     heart cath 96   ??? Shoulder pain, acute    ??? Shoulder pain, bilateral    ??? Sinusitis, acute maxillary    ??? Unstable angina (HCC) 07/06/2015   ??? Vitamin D deficiency    ??? Weakness       Past Surgical History:   Procedure Laterality Date   ???  HX COLONOSCOPY     ??? HX HEART CATHETERIZATION  2016    without intervention   ??? HX HEENT      goiter/thyroid surgery   ??? HX THYROIDECTOMY        Social History     Socioeconomic History   ??? Marital status: SINGLE     Spouse name: Not on file   ??? Number of children: Not on file   ??? Years of education: Not on file   ??? Highest education level: Not on file   Tobacco Use   ??? Smoking status: Former Smoker     Last attempt to quit: 1971     Years since quitting: 48.5   ??? Smokeless tobacco: Never Used   ??? Tobacco comment: quit in 1971   Substance and Sexual Activity   ??? Alcohol use: No   ??? Drug use: No   Social History Narrative    ** Merged History Encounter **  Family History   Problem Relation Age of Onset   ??? Stroke Sister    ??? Cancer Sister         cervical   ??? Stroke Brother    ??? Diabetes Other    ??? Hypertension Other        Medications:   Prior to Admission medications    Medication Sig Start Date End Date Taking? Authorizing Provider   pravastatin (PRAVACHOL) 80 mg tablet TK 1 T PO QPM 01/17/18  Yes Provider, Historical   lisinopril-hydroCHLOROthiazide (PRINZIDE, ZESTORETIC) 20-12.5 mg per tablet Take 1 Tab by mouth daily. 07/06/17  Yes Delana Meyer, MD   famotidine (PEPCID) 20 mg tablet take 1 tablet by mouth once daily 07/06/17  Yes Delana Meyer, MD   aspirin 81 mg chewable tablet Take 1 Tab by mouth daily. 07/08/15  Yes Nessmith, Rhona Raider, MD   meloxicam [facility]) 7.5 mg tablet take 1 tablet by mouth once daily 09/28/17   Provider, Historical   nitroglycerin (NITROSTAT) 0.4 mg SL tablet Place 1 sl under the tongue q 5 min prn cp, max 3 sl in a 15-min time period. Call 911 if no relief after the 3rd sl. 02/19/17   Maree Krabbe, MD       Physical Exam:         Heart: regular rate and rhythm, S1, S2 normal, no murmur, click, rub or gallop   Lungs: chest clear, no wheezing, rales, normal symmetric air entry, Heart exam - S1, S2 normal, no murmur, no gallop, rate regular   Abdominal: Bowel sounds are  normal, liver is not enlarged, spleen is not enlarged            Findings/Diagnosis: GERD, abdominal pain, CRC screen        Signed:  Dionne Milo, MD 03/01/2018

## 2018-03-01 NOTE — Progress Notes (Signed)
Blood sugar in pre-op 103

## 2018-03-01 NOTE — Procedures (Signed)
Colonoscopy Procedure Note    Indications:  CRC screening and abdominal pain    Anesthesia/Sedation: TIVA    Pre-Procedure Physical:  Current Facility-Administered Medications   Medication Dose Route Frequency   ??? lactated Ringers infusion  100 mL/hr IntraVENous CONTINUOUS     Facility-Administered Medications Ordered in Other Encounters   Medication Dose Route Frequency   ??? propofol (DIPRIVAN) 10 mg/mL injection    PRN   ??? propofol (DIPRIVAN) 10 mg/mL injection    CONTINUOUS   ??? lidocaine (PF) (XYLOCAINE) 20 mg/mL (2 %) injection   IntraVENous PRN   ??? glycopyrrolate (ROBINUL) injection   IntraVENous PRN   ??? PHENYLephrine 100 mcg/mL 10 mL syringe (ONE-STEP)   IntraVENous CONTINUOUS      Ativan [lorazepam]; Ativan [lorazepam]; Other medication; and Other medication    Patient Vitals for the past 8 hrs:   BP Temp Pulse Resp SpO2 Height Weight   03/01/18 1146 164/76 ??? 88 16 99 % ??? ???   03/01/18 1040 144/64 ??? 84 17 100 % ??? ???   03/01/18 1019 ??? 98.9 ??F (37.2 ??C) ??? ??? ??? 5\' 2"  (1.575 m) 87.5 kg (193 lb)       Exam:    Airway: clear   Heart: normal S1and S2    Lungs: clear bilateral  Abdomen: soft, nontender, bowel sounds present and normal in all quads   Mental Status: awake, alert and oriented to person, place and time        Procedure Details      Informed consent was obtained for the procedure, including sedation.  Risks of perforation, hemorrhage, adverse drug reaction and aspiration were discussed. The patient was placed in the left lateral decubitus position.  Based on the pre-procedure assessment, including review of the patient's medical history, medications, allergies, and review of systems, she had been deemed to be an appropriate candidate for conscious sedation; she was therefore sedated with the medications listed below.   The patient was monitored continuously with ECG tracing, pulse oximetry, blood pressure monitoring, and direct observations.      A rectal examination was performed. The colonoscope was  inserted into the rectum and advanced under direct vision to the cecum. The quality of the colonic preparation was good. A careful inspection was made as the colonoscope was withdrawn, including a retroflexed view of the rectum; findings and interventions are described below. Appropriate photodocumentation was obtained.    Findings:  Small 2+ internal hemorrhoids seen on retroflex in the rectum, a few sigmoid diverticula and an AC 3 mm sessile polyp that was cold biopsied away.  No source for pain was identified.    Specimens: AC polyp    Estimated Blood Loss: Minimal           Complications: None; patient tolerated the procedure well.           Attending Attestation: I performed the procedure.    Impression: An AC polyp, sigmoid diverticulosis and internal hemorrhoids.  No source for pain was identified.     Piedad ClimesP. Darren Nodal, MD    Recommendations:

## 2018-03-01 NOTE — Anesthesia Pre-Procedure Evaluation (Signed)
Relevant Problems   No relevant active problems       Anesthetic History               Review of Systems / Medical History  Patient summary reviewed and pertinent labs reviewed    Pulmonary        Sleep apnea: CPAP           Neuro/Psych              Cardiovascular    Hypertension          CAD and PAD    Exercise tolerance: <4 METS  Comments: AV sclerosis but not stenosis, hyperdynamic LV function, denies active cardiac condition, stress test Jan 2019 low risk scan without ischemia   GI/Hepatic/Renal                Endo/Other    Diabetes (diet controlled): well controlled, type 2    Obesity     Other Findings            Physical Exam    Airway  Mallampati: II  TM Distance: 4 - 6 cm  Neck ROM: normal range of motion   Mouth opening: Normal     Cardiovascular    Rhythm: regular  Rate: normal    Murmur: Grade 2, Aortic area     Dental    Dentition: Caps/crowns and Upper partial plate     Pulmonary      Decreased breath sounds: bibasilar           Abdominal         Other Findings            Anesthetic Plan    ASA: 3  Anesthesia type: total IV anesthesia          Induction: Intravenous  Anesthetic plan and risks discussed with: Patient and Family

## 2018-03-01 NOTE — Procedures (Signed)
ESOPHAGOGASTRODUODENOSCOPY    ESOPHAGOGASTRODUODENOSCOPY    DATE of PROCEDURE: 03/01/2018    PT NAME: Sarah Chavez     [id number]    MEDICATION:   TIVA  INSTRUMENT: GIF  H190    SPECIAL PROCEDURE: none  BLOOD LOSS- min.  SPEC- Duodenal and gastric    PROCEDURE:  Standard EGD      ASSESSMENT:  1. Normal esophagus and EG junction  2. Very small 1 cm hiatal hernia  3. Minimal gastritis--biopsied  4. Normal duodenum--biopsied    PLAN:   1. Continue Famotidine 20 mg 2X a day  2. F/U biopsies    Dionne MiloPaul A Latrise Bowland, MD

## 2018-03-01 NOTE — Anesthesia Post-Procedure Evaluation (Signed)
Procedure(s):  ESOPHAGOGASTRODUODENOSCOPY (EGD)  COLONOSCOPY/BMI 39  ESOPHAGOGASTRODUODENAL (EGD) BIOPSY  ENDOSCOPIC POLYPECTOMY.    total IV anesthesia    Anesthesia Post Evaluation        Patient location during evaluation: PACU  Patient participation: complete - patient participated  Level of consciousness: sleepy but conscious  Pain management: adequate  Airway patency: patent  Anesthetic complications: no  Cardiovascular status: acceptable  Respiratory status: acceptable  Hydration status: acceptable  Post anesthesia nausea and vomiting:  none      Vitals Value Taken Time   BP 99/52 03/01/2018 12:38 PM   Temp 36 ??C (96.8 ??F) 03/01/2018 12:27 PM   Pulse 81 03/01/2018 12:41 PM   Resp 18 03/01/2018 12:38 PM   SpO2 95 % 03/01/2018 12:41 PM   Vitals shown include unvalidated device data.

## 2018-03-01 NOTE — Anesthesia Pre-Procedure Evaluation (Addendum)
Relevant Problems   No relevant active problems       Anesthetic History               Review of Systems / Medical History  Patient summary reviewed and pertinent labs reviewed    Pulmonary        Sleep apnea: CPAP           Neuro/Psych              Cardiovascular    Hypertension          CAD and PAD    Exercise tolerance: <4 METS  Comments: AV sclerosis but not stenosis, hyperdynamic LV function, denies active cardiac condition, stress test Jan 2019 low risk scan without ischemia   GI/Hepatic/Renal                Endo/Other    Diabetes (diet controlled): well controlled, type 2    Obesity     Other Findings            Physical Exam    Airway  Mallampati: II  TM Distance: 4 - 6 cm  Neck ROM: normal range of motion   Mouth opening: Normal     Cardiovascular    Rhythm: regular  Rate: normal    Murmur: Grade 2, Aortic area     Dental    Dentition: Caps/crowns and Upper partial plate     Pulmonary      Decreased breath sounds: bibasilar           Abdominal         Other Findings            Anesthetic Plan    ASA: 3  Anesthesia type: total IV anesthesia          Induction: Intravenous  Anesthetic plan and risks discussed with: Patient and Family

## 2018-03-01 NOTE — Procedures (Signed)
ESOPHAGOGASTRODUODENOSCOPY    ESOPHAGOGASTRODUODENOSCOPY    DATE of PROCEDURE: 03/01/2018    PT NAME: Sarah Chavez     [id number]    MEDICATION:   TIVA  INSTRUMENT: GIF  H190    SPECIAL PROCEDURE: none  BLOOD LOSS- min.  SPEC- Duodenal and gastric    PROCEDURE:  Standard EGD      ASSESSMENT:  1. Normal esophagus and EG junction  2. Very small 1 cm hiatal hernia  3. Minimal gastritis--biopsied  4. Normal duodenum--biopsied    PLAN:   1. Continue Famotidine 20 mg 2X a day  2. F/U biopsies    Randell Detter A Adaley Kiene, MD

## 2018-03-23 ENCOUNTER — Encounter: Payer: MEDICARE | Attending: Rheumatology | Primary: Family Medicine

## 2018-03-31 ENCOUNTER — Ambulatory Visit: Attending: Family Medicine | Primary: Family Medicine

## 2018-03-31 ENCOUNTER — Ambulatory Visit: Admit: 2018-03-31 | Discharge: 2018-03-31 | Payer: MEDICARE | Attending: Family Medicine | Primary: Family Medicine

## 2018-03-31 DIAGNOSIS — R300 Dysuria: Secondary | ICD-10-CM

## 2018-03-31 LAB — AMB POC URINALYSIS DIP STICK AUTO W/ MICRO
Bilirubin (UA POC): NEGATIVE
Bilirubin, Urine, POC: NEGATIVE
Blood (UA POC): NEGATIVE
Blood (UA POC): NEGATIVE
Glucose (UA POC): NEGATIVE
Glucose, Urine, POC: NEGATIVE
Ketones (UA POC): NEGATIVE
Ketones, Urine, POC: NEGATIVE
Nitrite, Urine, POC: NEGATIVE
Nitrites (UA POC): NEGATIVE
Specific Gravity, Urine, POC: 1.015 NA (ref 1.001–1.035)
Specific gravity (UA POC): 1.015 (ref 1.001–1.035)
Urobilinogen (UA POC): NORMAL (ref 0.2–1)
Urobilinogen, POC: NORMAL (ref 0.2–1)
pH (UA POC): 7 (ref 4.6–8.0)
pH, Urine, POC: 7 NA (ref 4.6–8.0)

## 2018-03-31 MED ORDER — CIPROFLOXACIN 500 MG TAB
500 mg | ORAL_TABLET | Freq: Two times a day (BID) | ORAL | 0 refills | Status: AC
Start: 2018-03-31 — End: 2018-04-07

## 2018-03-31 NOTE — Progress Notes (Signed)
SUBJECTIVE:   Sarah Chavez is a 77 y.o. female presents today complaining of a roughly 10-day history of decreased urinary stream, mild dysuria and lower abdominal pain.  No nausea vomiting or fevers reported.  No fever or body aches reported.  Current medicines listed in the EMR and reviewed today.    HPI  See above    Past Medical History, Past Surgical History, Family history, Social History, and Medications were all reviewed with the patient today and updated as necessary.       Current Outpatient Medications   Medication Sig Dispense Refill   ??? ciprofloxacin HCl (CIPRO) 500 mg tablet Take 1 Tab by mouth two (2) times a day for 7 days. 14 Tab 0   ??? pravastatin (PRAVACHOL) 80 mg tablet TK 1 T PO QPM  1   ??? lisinopril-hydroCHLOROthiazide (PRINZIDE, ZESTORETIC) 20-12.5 mg per tablet Take 1 Tab by mouth daily. 90 Tab 3   ??? famotidine (PEPCID) 20 mg tablet take 1 tablet by mouth once daily 90 Tab 3   ??? nitroglycerin (NITROSTAT) 0.4 mg SL tablet Place 1 sl under the tongue q 5 min prn cp, max 3 sl in a 15-min time period. Call 911 if no relief after the 3rd sl. 1 Bottle prn   ??? aspirin 81 mg chewable tablet Take 1 Tab by mouth daily. 30 Tab 11     Allergies   Allergen Reactions   ??? Ativan [Lorazepam] Other (comments)     Made her feel crazy and chest pressure   ??? Ativan [Lorazepam] Other (comments)     Chest fullness   ??? Other Medication Palpitations     Some type of decongestant   ??? Other Medication Other (comments)     Doesn't take decongestants, but can't recall why     Patient Active Problem List   Diagnosis Code   ??? HTN (hypertension) I10   ??? Diabetes mellitus type 2, controlled (HCC) E11.9   ??? Coronary artery disease involving native coronary artery of native heart without angina pectoris I25.10   ??? Dyslipidemia E78.5   ??? OSA on CPAP G47.33, Z99.89   ??? Severe obesity (HCC) E66.01   ??? Elevated sed rate R70.0   ??? Normocytic anemia D64.9     Past Medical History:   Diagnosis Date   ??? Abdominal pain  07/22/2015   ??? Anemia    ??? Back pain    ??? Bursitis of shoulder    ??? CAD (coronary artery disease)    ??? Cerumen impaction    ??? Chest pain, atypical 07/22/2015   ??? Diabetes (HCC)     no meds, A1c 09/28/17 6.6; avg bs 105; denies ss of hypo   ??? Diabetes mellitus out of control (HCC)    ??? Endocrine disease     thyroid   ??? Fatigue    ??? GERD (gastroesophageal reflux disease)     controlled with medicaitons   ??? Hip pain, right    ??? Hyperlipidemia, mild    ??? Hypertension     managed with medication   ??? Hypertension associated with diabetes (HCC) 07/22/2015   ??? 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 Left carotid bruit    ??? Lumbago 07/22/2015   ??? Morbid obesity (HCC) 07/22/2015    BMI 35.3   ??? OSA on CPAP 07/22/2015    uses CPAP   ??? Other ill-defined conditions(799.89)     cholesterol   ??? Other ill-defined conditions(799.89)  heart cath 96   ??? Shoulder pain, acute    ??? Shoulder pain, bilateral    ??? Sinusitis, acute maxillary    ??? Unstable angina (HCC) 07/06/2015   ??? Vitamin D deficiency    ??? Weakness      Past Surgical History:   Procedure Laterality Date   ??? COLONOSCOPY N/A 03/01/2018    COLONOSCOPY/BMI 39 performed by Mazanec, Worthy RancherPaul A[facility]   ??? HX COLONOSCOPY     ??? HX HEART CATHETERIZATION  2016    without intervention   ??? HX HEENT      goiter/thyroid surgery   ??? HX THYROIDECTOMY       Family History   Problem Relation Age of Onset   ??? Stroke Sister    ??? Cancer Sister         cervical   ??? Stroke Brother    ??? Diabetes Other    ??? Hypertension Other      Social History     Tobacco Use   ??? Smoking status: Former Smoker     Last attempt to quit: 1971     Years since quitting: 48.5   ??? Smokeless tobacco: Never Used   ??? Tobacco comment: quit in 1971   Substance Use Topics   ??? Alcohol use: No         Review of Systems  See above    OBJECTIVE:  Visit Vitals  BP 124/80 (BP 1 Location: Left arm, BP Patient Position: Sitting)   Ht 5\' 2"  (1.575 m)   Wt 189 lb (85.7 kg)   BMI 34.57 kg/m??         Physical Exam   Constitutional: She is oriented to person, place, and time. She appears well-developed and well-nourished.   HENT:   Head: Normocephalic and atraumatic.   Eyes: Pupils are equal, round, and reactive to light. EOM are normal.   Neck: Normal range of motion. Neck supple. No JVD present. No thyromegaly present.   Cardiovascular: Normal rate, regular rhythm and normal heart sounds. Exam reveals no gallop and no friction rub.   No murmur heard.  Pulmonary/Chest: Effort normal. No respiratory distress. She has no wheezes. She has no rales.   Abdominal: Soft. There is no tenderness. There is no rebound and no guarding.   Musculoskeletal: Normal range of motion. She exhibits no edema.   Neurological: She is alert and oriented to person, place, and time.   Skin: No rash noted.   Psychiatric: She has a normal mood and affect.       Medical problems and test results were reviewed with the patient today.         ASSESSMENT and PLAN    1.  Dysuria.  Abnormal UA.  We will culture urine.  Treat with Cipro 500 mg twice a day for a week.  Return if symptoms persist or worsen.    2.  UTI.  As above.    3.  Hypertension.  Blood pressure is well maintained.  Continue current therapy.

## 2018-03-31 NOTE — Progress Notes (Signed)
SUBJECTIVE:   Sarah Chavez is a 77 y.o. female presents today complaining of a roughly 10-day history of decreased urinary stream, mild dysuria and lower abdominal pain.  No nausea vomiting or fevers reported.  No fever or body aches reported.  Current medicines listed in the EMR and reviewed today.    HPI  See above    Past Medical History, Past Surgical History, Family history, Social History, and Medications were all reviewed with the patient today and updated as necessary.       Current Outpatient Medications   Medication Sig Dispense Refill   ??? ciprofloxacin HCl (CIPRO) 500 mg tablet Take 1 Tab by mouth two (2) times a day for 7 days. 14 Tab 0   ??? pravastatin (PRAVACHOL) 80 mg tablet TK 1 T PO QPM  1   ??? lisinopril-hydroCHLOROthiazide (PRINZIDE, ZESTORETIC) 20-12.5 mg per tablet Take 1 Tab by mouth daily. 90 Tab 3   ??? famotidine (PEPCID) 20 mg tablet take 1 tablet by mouth once daily 90 Tab 3   ??? nitroglycerin (NITROSTAT) 0.4 mg SL tablet Place 1 sl under the tongue q 5 min prn cp, max 3 sl in a 15-min time period. Call 911 if no relief after the 3rd sl. 1 Bottle prn   ??? aspirin 81 mg chewable tablet Take 1 Tab by mouth daily. 30 Tab 11     Allergies   Allergen Reactions   ??? Ativan [Lorazepam] Other (comments)     Made her feel crazy and chest pressure   ??? Ativan [Lorazepam] Other (comments)     Chest fullness   ??? Other Medication Palpitations     Some type of decongestant   ??? Other Medication Other (comments)     Doesn't take decongestants, but can't recall why     Patient Active Problem List   Diagnosis Code   ??? HTN (hypertension) I10   ??? Diabetes mellitus type 2, controlled (HCC) E11.9   ??? Coronary artery disease involving native coronary artery of native heart without angina pectoris I25.10   ??? Dyslipidemia E78.5   ??? OSA on CPAP G47.33, Z99.89   ??? Severe obesity (HCC) E66.01   ??? Elevated sed rate R70.0   ??? Normocytic anemia D64.9     Past Medical History:   Diagnosis Date    ??? Abdominal pain 07/22/2015   ??? Anemia    ??? Back pain    ??? Bursitis of shoulder    ??? CAD (coronary artery disease)    ??? Cerumen impaction    ??? Chest pain, atypical 07/22/2015   ??? Diabetes (HCC)     no meds, A1c 09/28/17 6.6; avg bs 105; denies ss of hypo   ??? Diabetes mellitus out of control (HCC)    ??? Endocrine disease     thyroid   ??? Fatigue    ??? GERD (gastroesophageal reflux disease)     controlled with medicaitons   ??? Hip pain, right    ??? Hyperlipidemia, mild    ??? Hypertension     managed with medication   ??? Hypertension associated with diabetes (HCC) 07/22/2015   ??? 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 Left carotid bruit    ??? Lumbago 07/22/2015   ??? Morbid obesity (HCC) 07/22/2015    BMI 35.3   ??? OSA on CPAP 07/22/2015    uses CPAP   ??? Other ill-defined conditions(799.89)     cholesterol   ??? Other ill-defined conditions(799.89)  heart cath 96   ??? Shoulder pain, acute    ??? Shoulder pain, bilateral    ??? Sinusitis, acute maxillary    ??? Unstable angina (HCC) 07/06/2015   ??? Vitamin D deficiency    ??? Weakness      Past Surgical History:   Procedure Laterality Date   ??? COLONOSCOPY N/A 03/01/2018    COLONOSCOPY/BMI 39 performed by Mazanec, Worthy Rancher, MD at [facility] ENDOSCOPY   ??? HX COLONOSCOPY     ??? HX HEART CATHETERIZATION  2016    without intervention   ??? HX HEENT      goiter/thyroid surgery   ??? HX THYROIDECTOMY       Family History   Problem Relation Age of Onset   ??? Stroke Sister    ??? Cancer Sister         cervical   ??? Stroke Brother    ??? Diabetes Other    ??? Hypertension Other      Social History     Tobacco Use   ??? Smoking status: Former Smoker     Last attempt to quit: 1971     Years since quitting: 48.5   ??? Smokeless tobacco: Never Used   ??? Tobacco comment: quit in 1971   Substance Use Topics   ??? Alcohol use: No         Review of Systems  See above    OBJECTIVE:  Visit Vitals  BP 124/80 (BP 1 Location: Left arm, BP Patient Position: Sitting)   Ht 5\' 2"  (1.575 m)   Wt 189 lb (85.7 kg)    BMI 34.57 kg/m??        Physical Exam   Constitutional: She is oriented to person, place, and time. She appears well-developed and well-nourished.   HENT:   Head: Normocephalic and atraumatic.   Eyes: Pupils are equal, round, and reactive to light. EOM are normal.   Neck: Normal range of motion. Neck supple. No JVD present. No thyromegaly present.   Cardiovascular: Normal rate, regular rhythm and normal heart sounds. Exam reveals no gallop and no friction rub.   No murmur heard.  Pulmonary/Chest: Effort normal. No respiratory distress. She has no wheezes. She has no rales.   Abdominal: Soft. There is no tenderness. There is no rebound and no guarding.   Musculoskeletal: Normal range of motion. She exhibits no edema.   Neurological: She is alert and oriented to person, place, and time.   Skin: No rash noted.   Psychiatric: She has a normal mood and affect.       Medical problems and test results were reviewed with the patient today.         ASSESSMENT and PLAN    1.  Dysuria.  Abnormal UA.  We will culture urine.  Treat with Cipro 500 mg twice a day for a week.  Return if symptoms persist or worsen.    2.  UTI.  As above.    3.  Hypertension.  Blood pressure is well maintained.  Continue current therapy.

## 2018-04-02 ENCOUNTER — Inpatient Hospital Stay
Admit: 2018-04-02 | Discharge: 2018-04-02 | Disposition: A | Discharge status: Home / Self Care | Payer: MEDICARE | Attending: Emergency Medicine

## 2018-04-02 ENCOUNTER — Emergency Department: Admit: 2018-04-02 | Payer: MEDICARE | Primary: Family Medicine

## 2018-04-02 DIAGNOSIS — R1012 Left upper quadrant pain: Secondary | ICD-10-CM

## 2018-04-02 LAB — CBC W/O DIFF
ABSOLUTE NRBC: 0 10*3/uL (ref 0.0–0.2)
HCT: 28 % — ABNORMAL LOW (ref 35.8–46.3)
HGB: 8.8 g/dL — ABNORMAL LOW (ref 11.7–15.4)
MCH: 26.4 PG (ref 26.1–32.9)
MCHC: 31.4 g/dL (ref 31.4–35.0)
MCV: 84.1 FL (ref 79.6–97.8)
MPV: 10.2 FL (ref 9.4–12.3)
PLATELET: 240 10*3/uL (ref 150–450)
RBC: 3.33 M/uL — ABNORMAL LOW (ref 4.05–5.2)
RDW: 15.5 % — ABNORMAL HIGH (ref 11.9–14.6)
WBC: 11 10*3/uL (ref 4.3–11.1)

## 2018-04-02 LAB — METABOLIC PANEL, COMPREHENSIVE
A-G Ratio: 0.5 — ABNORMAL LOW (ref 1.2–3.5)
ALT (SGPT): 16 U/L (ref 12–65)
AST (SGOT): 18 U/L (ref 15–37)
Albumin: 2.9 g/dL — ABNORMAL LOW (ref 3.2–4.6)
Alk. phosphatase: 65 U/L (ref 50–136)
Anion gap: 8 mmol/L (ref 7–16)
BUN: 9 MG/DL (ref 8–23)
Bilirubin, total: 0.5 MG/DL (ref 0.2–1.1)
CO2: 27 mmol/L (ref 21–32)
Calcium: 8.8 MG/DL (ref 8.3–10.4)
Chloride: 100 mmol/L (ref 98–107)
Creatinine: 1.04 MG/DL — ABNORMAL HIGH (ref 0.6–1.0)
GFR est AA: >60 mL/min/{1.73_m2} (ref >60)
GFR est non-AA: 54 mL/min/{1.73_m2} — ABNORMAL LOW (ref >60)
Globulin: 6.2 g/dL — ABNORMAL HIGH (ref 2.3–3.5)
Glucose: 102 mg/dL — ABNORMAL HIGH (ref 65–100)
Potassium: 3.7 mmol/L (ref 3.5–5.1)
Protein, total: 9.1 g/dL — ABNORMAL HIGH (ref 6.3–8.2)
Sodium: 135 mmol/L — ABNORMAL LOW (ref 136–145)

## 2018-04-02 LAB — URINALYSIS W/ RFLX MICROSCOPIC
Bilirubin, Urine: NEGATIVE
Bilirubin: NEGATIVE
Blood, Urine: NEGATIVE
Blood: NEGATIVE
Glucose, Ur: NEGATIVE mg/dL
Glucose: NEGATIVE mg/dL
Ketone: NEGATIVE mg/dL
Ketones, Urine: NEGATIVE mg/dL
Leukocyte Esterase, Urine: NEGATIVE
Leukocyte Esterase: NEGATIVE
Nitrite, Urine: NEGATIVE
Nitrites: NEGATIVE
Protein, UA: NEGATIVE mg/dL
Protein: NEGATIVE mg/dL
Specific Gravity, UA: 1.011 (ref 1.001–1.023)
Specific gravity: 1.011 (ref 1.001–1.023)
Urobilinogen, UA, POCT: 1 EU/dL (ref 0.2–1.0)
Urobilinogen: 1 EU/dL (ref 0.2–1.0)
pH (UA): 7 (ref 5.0–9.0)
pH, UA: 7 (ref 5.0–9.0)

## 2018-04-02 LAB — CULTURE, URINE

## 2018-04-02 LAB — LIPASE
Lipase: 86 U/L (ref 73–393)
Lipase: 86 U/L (ref 73–393)

## 2018-04-02 LAB — COMPREHENSIVE METABOLIC PANEL
ALT: 16 U/L (ref 12–65)
AST: 18 U/L (ref 15–37)
Albumin/Globulin Ratio: 0.5 — ABNORMAL LOW (ref 1.2–3.5)
Albumin: 2.9 g/dL — ABNORMAL LOW (ref 3.2–4.6)
Alkaline Phosphatase: 65 U/L (ref 50–136)
Anion Gap: 8 mmol/L (ref 7–16)
BUN: 9 MG/DL (ref 8–23)
CO2: 27 mmol/L (ref 21–32)
Calcium: 8.8 MG/DL (ref 8.3–10.4)
Chloride: 100 mmol/L (ref 98–107)
Creatinine: 1.04 MG/DL — ABNORMAL HIGH (ref 0.6–1.0)
EGFR IF NonAfrican American: 54 mL/min/{1.73_m2} — ABNORMAL LOW (ref >60)
GFR African American: >60 mL/min/{1.73_m2} (ref >60)
Globulin: 6.2 g/dL — ABNORMAL HIGH (ref 2.3–3.5)
Glucose: 102 mg/dL — ABNORMAL HIGH (ref 65–100)
Potassium: 3.7 mmol/L (ref 3.5–5.1)
Sodium: 135 mmol/L — ABNORMAL LOW (ref 136–145)
Total Bilirubin: 0.5 MG/DL (ref 0.2–1.1)
Total Protein: 9.1 g/dL — ABNORMAL HIGH (ref 6.3–8.2)

## 2018-04-02 LAB — CBC
Hematocrit: 28 % — ABNORMAL LOW (ref 35.8–46.3)
Hemoglobin: 8.8 g/dL — ABNORMAL LOW (ref 11.7–15.4)
MCH: 26.4 PG (ref 26.1–32.9)
MCHC: 31.4 g/dL (ref 31.4–35.0)
MCV: 84.1 FL (ref 79.6–97.8)
MPV: 10.2 FL (ref 9.4–12.3)
NRBC Absolute: 0 10*3/uL (ref 0.0–0.2)
Platelets: 240 10*3/uL (ref 150–450)
RBC: 3.33 M/uL — ABNORMAL LOW (ref 4.05–5.2)
RDW: 15.5 % — ABNORMAL HIGH (ref 11.9–14.6)
WBC: 11 10*3/uL (ref 4.3–11.1)

## 2018-04-02 MED ORDER — ONDANSETRON HCL 4 MG TAB
4 mg | ORAL_TABLET | Freq: Four times a day (QID) | ORAL | 0 refills | Status: DC | PRN
Start: 2018-04-02 — End: 2018-06-01

## 2018-04-02 MED ORDER — TRAMADOL 50 MG TAB
50 mg | ORAL_TABLET | Freq: Four times a day (QID) | ORAL | 0 refills | Status: AC | PRN
Start: 2018-04-02 — End: 2018-04-07

## 2018-04-02 MED ORDER — PHENAZOPYRIDINE 200 MG TAB
200 mg | ORAL_TABLET | Freq: Three times a day (TID) | ORAL | 0 refills | Status: AC
Start: 2018-04-02 — End: 2018-04-04

## 2018-04-02 NOTE — ED Notes (Signed)
Patient urinated prior to arrival in the ER. Cannot provide urine sample at this time.

## 2018-04-02 NOTE — ED Notes (Signed)
Patient attempted to obtain urine sample. Was not able to hit the sample cup. MD notified.

## 2018-04-02 NOTE — ED Provider Notes (Signed)
77 year old female presents with 2+ week history of lower abdominal pain and left upper quadrant, left lower chest pain  Persistent and may be slightly progressive  Placed on Cipro 2 days ago by her primary care doctor for UTI  Patient has not noted any improvement since instituting antibiotic therapy    Mild subjective fever (a hot feeling)  Rare chills  No vomiting or diarrhea    No history of injury    The history is provided by the patient and a relative.   Abdominal Pain    This is a recurrent problem. The current episode started more than 1 week ago. The problem occurs constantly. The problem has been gradually worsening. The pain is associated with an unknown factor. The pain is located in the LUQ, LLQ and suprapubic region. The quality of the pain is aching and cramping. The pain is moderate. Associated symptoms include a fever, dysuria, frequency, chest pain and back pain. Pertinent negatives include no diarrhea, no vomiting, no constipation, no headaches, no arthralgias, no myalgias and no trauma. The pain is worsened by urination and activity. The pain is relieved by nothing. Past workup includes esophagogastroduodenoscopy, colonoscopy. Her past medical history is significant for DM. The patient's surgical history non-contributory.       Past Medical History:   Diagnosis Date   ??? Abdominal pain 07/22/2015   ??? Anemia    ??? Back pain    ??? Bursitis of shoulder    ??? CAD (coronary artery disease)    ??? Cerumen impaction    ??? Chest pain, atypical 07/22/2015   ??? Diabetes (HCC)     no meds, A1c 09/28/17 6.6; avg bs 105; denies ss of hypo   ??? Diabetes mellitus out of control (HCC)    ??? Endocrine disease     thyroid   ??? Fatigue    ??? GERD (gastroesophageal reflux disease)     controlled with medicaitons   ??? Hip pain, right    ??? Hyperlipidemia, mild    ??? Hypertension     managed with medication   ??? Hypertension associated with diabetes (HCC) 07/22/2015   ??? 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  Left carotid bruit    ??? Lumbago 07/22/2015   ??? Morbid obesity (HCC) 07/22/2015    BMI 35.3   ??? OSA on CPAP 07/22/2015    uses CPAP   ??? Other ill-defined conditions(799.89)     cholesterol   ??? Other ill-defined conditions(799.89)     heart cath 96   ??? Shoulder pain, acute    ??? Shoulder pain, bilateral    ??? Sinusitis, acute maxillary    ??? Unstable angina (HCC) 07/06/2015   ??? Vitamin D deficiency    ??? Weakness        Past Surgical History:   Procedure Laterality Date   ??? COLONOSCOPY N/A 03/01/2018    COLONOSCOPY/BMI 39 performed by Mazanec, Worthy RancherPaul A[facility]   ??? HX COLONOSCOPY     ??? HX HEART CATHETERIZATION  2016    without intervention   ??? HX HEENT      goiter/thyroid surgery   ??? HX THYROIDECTOMY           Family History:   Problem Relation Age of Onset   ??? Stroke Sister    ??? Cancer Sister         cervical   ??? Stroke Brother    ??? Diabetes Other    ??? Hypertension  Other        Social History     Socioeconomic History   ??? Marital status: SINGLE     Spouse name: Not on file   ??? Number of children: Not on file   ??? Years of education: Not on file   ??? Highest education level: Not on file   Occupational History   ??? Not on file   Social Needs   ??? Financial resource strain: Not on file   ??? Food insecurity:     Worry: Not on file     Inability: Not on file   ??? Transportation needs:     Medical: Not on file     Non-medical: Not on file   Tobacco Use   ??? Smoking status: Former Smoker     Last attempt to quit: 1971     Years since quitting: 48.6   ??? Smokeless tobacco: Never Used   ??? Tobacco comment: quit in 1971   Substance and Sexual Activity   ??? Alcohol use: No   ??? Drug use: No   ??? Sexual activity: Not on file   Lifestyle   ??? Physical activity:     Days per week: Not on file     Minutes per session: Not on file   ??? Stress: Not on file   Relationships   ??? Social connections:     Talks on phone: Not on file     Gets together: Not on file     Attends religious service: Not on file     Active member of club or organization:  Not on file     Attends meetings of clubs or organizations: Not on file     Relationship status: Not on file   ??? Intimate partner violence:     Fear of current or ex partner: Not on file     Emotionally abused: Not on file     Physically abused: Not on file     Forced sexual activity: Not on file   Other Topics Concern   ??? Not on file   Social History Narrative    ** Merged History Encounter **              ALLERGIES: Ativan [lorazepam]; Ativan [lorazepam]; Other medication; and Other medication    Review of Systems   Constitutional: Positive for chills, fatigue and fever.   HENT: Negative for congestion, ear pain, rhinorrhea, sore throat and trouble swallowing.    Eyes: Negative for photophobia and discharge.   Respiratory: Negative for cough and shortness of breath.    Cardiovascular: Positive for chest pain. Negative for palpitations.   Gastrointestinal: Positive for abdominal pain. Negative for constipation, diarrhea and vomiting.   Endocrine: Negative for cold intolerance and heat intolerance.   Genitourinary: Positive for dysuria and frequency. Negative for flank pain.   Musculoskeletal: Positive for back pain. Negative for arthralgias, myalgias and neck pain.   Skin: Negative for rash and wound.   Allergic/Immunologic: Negative for environmental allergies and food allergies.   Neurological: Negative for syncope and headaches.   Hematological: Negative for adenopathy. Does not bruise/bleed easily.   Psychiatric/Behavioral: Positive for dysphoric mood. The patient is not nervous/anxious.    All other systems reviewed and are negative.      Vitals:    04/02/18 1123   Pulse: 81   Resp: 16   Temp: 98.3 ??F (36.8 ??C)   SpO2: 100%   Weight: 90.7 kg (200 lb)   Height:  5\' 2"  (1.575 m)            Physical Exam   Constitutional: She is oriented to person, place, and time. She appears well-developed and well-nourished. She appears distressed.   HENT:   Head: Normocephalic and atraumatic.   Right Ear: External ear normal.    Left Ear: External ear normal.   Mouth/Throat: Oropharynx is clear and moist. No oropharyngeal exudate.   Eyes: Pupils are equal, round, and reactive to light. Conjunctivae and EOM are normal.   Neck: Normal range of motion. Neck supple. No JVD present.   Cardiovascular: Normal rate, regular rhythm, normal heart sounds and intact distal pulses. Exam reveals no gallop and no friction rub.   No murmur heard.  Mild reproducible tenderness over the left lower rib border  No Crepitance   Pulmonary/Chest: Effort normal and breath sounds normal.   Abdominal: Soft. Normal appearance and bowel sounds are normal. She exhibits no distension and no mass. There is no hepatosplenomegaly. There is tenderness in the left upper quadrant and left lower quadrant. There is no rigidity, no rebound, no guarding and no CVA tenderness.   Musculoskeletal: Normal range of motion. She exhibits no edema or deformity.   Neurological: She is alert and oriented to person, place, and time. She has normal strength. No cranial nerve deficit or sensory deficit. She displays a negative Romberg sign. Gait normal.   Skin: Skin is warm and dry. Capillary refill takes less than 2 seconds. No rash noted.   Psychiatric: She has a normal mood and affect. Her speech is normal and behavior is normal. Judgment and thought content normal. Cognition and memory are normal.   Nursing note and vitals reviewed.       MDM  Number of Diagnoses or Management Options  Chronic anemia: established and improving  Left sided abdominal pain: established and worsening  Diagnosis management comments: Differential diagnosis  UTI, diverticulitis, pancreatitis, muscle strain, pneumonia, pleurisy      Workup today does not reveal any acute findings  Will add nausea medicine and some Ultram for pain  Patient will continue complete the course of antibiotics previously prescribed  Recheck with primary care on Monday       Amount and/or Complexity of Data Reviewed  Clinical lab  tests: ordered and reviewed  Tests in the radiology section of CPT??: ordered and reviewed  Tests in the medicine section of CPT??: ordered and reviewed  Review and summarize past medical records: yes  Independent visualization of images, tracings, or specimens: yes    Risk of Complications, Morbidity, and/or Mortality  Presenting problems: moderate  Diagnostic procedures: moderate  Management options: moderate  General comments: Elements of this note have been dictated via voice recognition software.  Text and phrases may be limited by the accuracy of the software.  The chart has been reviewed, but errors may still be present.      Patient Progress  Patient progress: improved         Procedures

## 2018-04-02 NOTE — ED Notes (Signed)
 I have reviewed discharge instructions with the patient.  The patient verbalized understanding.    Patient left ED via Discharge Method: ambulatory to Home with daughters.    Opportunity for questions and clarification provided.       Patient given 3 scripts.         To continue your aftercare when you leave the hospital, you may receive an automated call from our care team to check in on how you are doing.  This is a free service and part of our promise to provide the best care and service to meet your aftercare needs." If you have questions, or wish to unsubscribe from this service please call [phone].  Thank you for Choosing our [facility] Emergency Department.

## 2018-04-02 NOTE — ED Notes (Signed)
Pt with multiple complaints; abd and left flank pain, urinary frequency are chief among them.

## 2018-04-02 NOTE — ED Notes (Signed)
Patient attempted to obtain urine sample. Was not able to hit the sample cup. MD notified.

## 2018-04-02 NOTE — ED Notes (Signed)
Patient urinated prior to arrival in the ER. Cannot provide urine sample at this time.

## 2018-04-02 NOTE — ED Provider Notes (Signed)
77 year old female presents with 2+ week history of lower abdominal pain and left upper quadrant, left lower chest pain  Persistent and may be slightly progressive  Placed on Cipro 2 days ago by her primary care doctor for UTI  Patient has not noted any improvement since instituting antibiotic therapy    Mild subjective fever (a hot feeling)  Rare chills  No vomiting or diarrhea    No history of injury    The history is provided by the patient and a relative.   Abdominal Pain    This is a recurrent problem. The current episode started more than 1 week ago. The problem occurs constantly. The problem has been gradually worsening. The pain is associated with an unknown factor. The pain is located in the LUQ, LLQ and suprapubic region. The quality of the pain is aching and cramping. The pain is moderate. Associated symptoms include a fever, dysuria, frequency, chest pain and back pain. Pertinent negatives include no diarrhea, no vomiting, no constipation, no headaches, no arthralgias, no myalgias and no trauma. The pain is worsened by urination and activity. The pain is relieved by nothing. Past workup includes esophagogastroduodenoscopy, colonoscopy. Her past medical history is significant for DM. The patient's surgical history non-contributory.       Past Medical History:   Diagnosis Date   ??? Abdominal pain 07/22/2015   ??? Anemia    ??? Back pain    ??? Bursitis of shoulder    ??? CAD (coronary artery disease)    ??? Cerumen impaction    ??? Chest pain, atypical 07/22/2015   ??? Diabetes (HCC)     no meds, A1c 09/28/17 6.6; avg bs 105; denies ss of hypo   ??? Diabetes mellitus out of control (HCC)    ??? Endocrine disease     thyroid   ??? Fatigue    ??? GERD (gastroesophageal reflux disease)     controlled with medicaitons   ??? Hip pain, right    ??? Hyperlipidemia, mild    ??? Hypertension     managed with medication   ??? Hypertension associated with diabetes (HCC) 07/22/2015   ??? 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 Left carotid bruit    ??? Lumbago 07/22/2015   ??? Morbid obesity (HCC) 07/22/2015    BMI 35.3   ??? OSA on CPAP 07/22/2015    uses CPAP   ??? Other ill-defined conditions(799.89)     cholesterol   ??? Other ill-defined conditions(799.89)     heart cath 96   ??? Shoulder pain, acute    ??? Shoulder pain, bilateral    ??? Sinusitis, acute maxillary    ??? Unstable angina (HCC) 07/06/2015   ??? Vitamin D deficiency    ??? Weakness        Past Surgical History:   Procedure Laterality Date   ??? COLONOSCOPY N/A 03/01/2018    COLONOSCOPY/BMI 39 performed by Mazanec, Worthy Rancher, MD at [facility] ENDOSCOPY   ??? HX COLONOSCOPY     ??? HX HEART CATHETERIZATION  2016    without intervention   ??? HX HEENT      goiter/thyroid surgery   ??? HX THYROIDECTOMY           Family History:   Problem Relation Age of Onset   ??? Stroke Sister    ??? Cancer Sister         cervical   ??? Stroke Brother    ??? Diabetes Other    ??? Hypertension  Other        Social History     Socioeconomic History   ??? Marital status: SINGLE     Spouse name: Not on file   ??? Number of children: Not on file   ??? Years of education: Not on file   ??? Highest education level: Not on file   Occupational History   ??? Not on file   Social Needs   ??? Financial resource strain: Not on file   ??? Food insecurity:     Worry: Not on file     Inability: Not on file   ??? Transportation needs:     Medical: Not on file     Non-medical: Not on file   Tobacco Use   ??? Smoking status: Former Smoker     Last attempt to quit: 1971     Years since quitting: 48.6   ??? Smokeless tobacco: Never Used   ??? Tobacco comment: quit in 1971   Substance and Sexual Activity   ??? Alcohol use: No   ??? Drug use: No   ??? Sexual activity: Not on file   Lifestyle   ??? Physical activity:     Days per week: Not on file     Minutes per session: Not on file   ??? Stress: Not on file   Relationships   ??? Social connections:     Talks on phone: Not on file     Gets together: Not on file     Attends religious service: Not on file      Active member of club or organization: Not on file     Attends meetings of clubs or organizations: Not on file     Relationship status: Not on file   ??? Intimate partner violence:     Fear of current or ex partner: Not on file     Emotionally abused: Not on file     Physically abused: Not on file     Forced sexual activity: Not on file   Other Topics Concern   ??? Not on file   Social History Narrative    ** Merged History Encounter **              ALLERGIES: Ativan [lorazepam]; Ativan [lorazepam]; Other medication; and Other medication    Review of Systems   Constitutional: Positive for chills, fatigue and fever.   HENT: Negative for congestion, ear pain, rhinorrhea, sore throat and trouble swallowing.    Eyes: Negative for photophobia and discharge.   Respiratory: Negative for cough and shortness of breath.    Cardiovascular: Positive for chest pain. Negative for palpitations.   Gastrointestinal: Positive for abdominal pain. Negative for constipation, diarrhea and vomiting.   Endocrine: Negative for cold intolerance and heat intolerance.   Genitourinary: Positive for dysuria and frequency. Negative for flank pain.   Musculoskeletal: Positive for back pain. Negative for arthralgias, myalgias and neck pain.   Skin: Negative for rash and wound.   Allergic/Immunologic: Negative for environmental allergies and food allergies.   Neurological: Negative for syncope and headaches.   Hematological: Negative for adenopathy. Does not bruise/bleed easily.   Psychiatric/Behavioral: Positive for dysphoric mood. The patient is not nervous/anxious.    All other systems reviewed and are negative.      Vitals:    04/02/18 1123   Pulse: 81   Resp: 16   Temp: 98.3 ??F (36.8 ??C)   SpO2: 100%   Weight: 90.7 kg (200 lb)   Height:  5\' 2"  (1.575 m)            Physical Exam   Constitutional: She is oriented to person, place, and time. She appears well-developed and well-nourished. She appears distressed.   HENT:    Head: Normocephalic and atraumatic.   Right Ear: External ear normal.   Left Ear: External ear normal.   Mouth/Throat: Oropharynx is clear and moist. No oropharyngeal exudate.   Eyes: Pupils are equal, round, and reactive to light. Conjunctivae and EOM are normal.   Neck: Normal range of motion. Neck supple. No JVD present.   Cardiovascular: Normal rate, regular rhythm, normal heart sounds and intact distal pulses. Exam reveals no gallop and no friction rub.   No murmur heard.  Mild reproducible tenderness over the left lower rib border  No Crepitance   Pulmonary/Chest: Effort normal and breath sounds normal.   Abdominal: Soft. Normal appearance and bowel sounds are normal. She exhibits no distension and no mass. There is no hepatosplenomegaly. There is tenderness in the left upper quadrant and left lower quadrant. There is no rigidity, no rebound, no guarding and no CVA tenderness.   Musculoskeletal: Normal range of motion. She exhibits no edema or deformity.   Neurological: She is alert and oriented to person, place, and time. She has normal strength. No cranial nerve deficit or sensory deficit. She displays a negative Romberg sign. Gait normal.   Skin: Skin is warm and dry. Capillary refill takes less than 2 seconds. No rash noted.   Psychiatric: She has a normal mood and affect. Her speech is normal and behavior is normal. Judgment and thought content normal. Cognition and memory are normal.   Nursing note and vitals reviewed.       MDM  Number of Diagnoses or Management Options  Chronic anemia: established and improving  Left sided abdominal pain: established and worsening  Diagnosis management comments: Differential diagnosis  UTI, diverticulitis, pancreatitis, muscle strain, pneumonia, pleurisy      Workup today does not reveal any acute findings  Will add nausea medicine and some Ultram for pain  Patient will continue complete the course of antibiotics previously prescribed   Recheck with primary care on Monday       Amount and/or Complexity of Data Reviewed  Clinical lab tests: ordered and reviewed  Tests in the radiology section of CPT??: ordered and reviewed  Tests in the medicine section of CPT??: ordered and reviewed  Review and summarize past medical records: yes  Independent visualization of images, tracings, or specimens: yes    Risk of Complications, Morbidity, and/or Mortality  Presenting problems: moderate  Diagnostic procedures: moderate  Management options: moderate  General comments: Elements of this note have been dictated via voice recognition software.  Text and phrases may be limited by the accuracy of the software.  The chart has been reviewed, but errors may still be present.      Patient Progress  Patient progress: improved         Procedures

## 2018-04-02 NOTE — ED Triage Notes (Signed)
Pt with multiple complaints; abd and left flank pain, urinary frequency are chief among them.

## 2018-04-02 NOTE — ED Notes (Signed)
I have reviewed discharge instructions with the patient.  The patient verbalized understanding.    Patient left ED via Discharge Method: ambulatory to Home with daughters.    Opportunity for questions and clarification provided.       Patient given 3 scripts.         To continue your aftercare when you leave the hospital, you may receive an automated call from our care team to check in on how you are doing.  This is a free service and part of our promise to provide the best care and service to meet your aftercare needs.??? If you have questions, or wish to unsubscribe from this service please call [phone].  Thank you for Choosing our [facility].

## 2018-04-03 LAB — EKG, 12 LEAD, INITIAL
Atrial Rate: 86 {beats}/min
Calculated P Axis: 15 degrees
Calculated R Axis: -3 degrees
Calculated T Axis: 40 degrees
Diagnosis: NORMAL
P-R Interval: 192 ms
Q-T Interval: 382 ms
QRS Duration: 86 ms
QTC Calculation (Bezet): 457 ms
Ventricular Rate: 86 {beats}/min

## 2018-04-03 LAB — EKG 12-LEAD
Atrial Rate: 86 {beats}/min
Diagnosis: NORMAL
P Axis: 15 degrees
P-R Interval: 192 ms
Q-T Interval: 382 ms
QRS Duration: 86 ms
QTc Calculation (Bazett): 457 ms
R Axis: -3 degrees
T Axis: 40 degrees
Ventricular Rate: 86 {beats}/min

## 2018-05-05 ENCOUNTER — Ambulatory Visit: Attending: Rheumatology | Primary: Family Medicine

## 2018-05-05 ENCOUNTER — Ambulatory Visit: Admit: 2018-05-05 | Discharge: 2018-05-05 | Payer: MEDICARE | Attending: Rheumatology | Primary: Family Medicine

## 2018-05-05 DIAGNOSIS — M2550 Pain in unspecified joint: Secondary | ICD-10-CM

## 2018-05-05 NOTE — Addendum Note (Signed)
Addended by: Susy ManorMACHIMADA, Vallarie Fei J on: 05/10/2018 07:52 AM     Modules accepted: Level of Service

## 2018-05-05 NOTE — Progress Notes (Signed)
Progress Notes by Ethlyn Daniels, MD at 05/05/18 1400                Author: Ethlyn Daniels, MD  Service: --  Author Type: Physician       Filed: 05/05/18 1503  Encounter Date: 05/05/2018  Status: Signed          Editor: Ethlyn Daniels, MD (Physician)                  [facility]   Jeffie Pollock, M.D.   [address] [place], [place], [place], [address]   Office : [phone], Fax: [phone]         05/05/2018      Dear Dr. Faith Rogue ,      Thank you for asking me to assist in the care of your patient Loreta Blouch  who was seen in my office on  05/05/2018 for evaluation of  joint pain with an elevated ESR. I have included the full consultation note below.  I will continue to follow your patient and will forward  all future office visit notes to you.  If you have any questions or concerns about any aspect of your patient's care, please do not hesitate to contact me.      I look forward to continuing to care for this patient with you.      Sincerely,      Dr. Emmie Niemann               RHEUMATOLOGY INITIAL CONSULTATION NOTE   Date of Visit:  05/05/2018 2:12  PM      Patient Information:   Name:  Sarah Chavez   DOB:  October 09, 1940   Age:  77 y.o.    Gender:  female      PHYSICIAN REQUESTING CONSULTATION:  Fahd Quddus      Chief Complaint:     Chief Complaint       Patient presents with        ?  New Patient     ?  Joint Pain        ?  Abnormal Lab Results              History of Present Illness:   Sarah Chavez is a 77 y.o.  female who was referred for evaluation of joint pain with an elevated ESR and an elevated CRP.  On talking to the patient she states she has  had joint pain for 20 years. The joint pain has gotten worse.  On talking to her futher she states that she does run a low-grade fever most of the days as well usually late in the evening.  She denies any associated chills with it.  Her current joint  complaints are as mentioned  below.      Medical records were thoroughly reviewed and history was also obtained from patient:         Overall, she says she is feeling "good"         Pain: 9/10   Location:  Right shoulder pain worse than the left shoulder pain. No para spinal muscle pain. Some neck stiffness with no pain with neck ROM. Has had occasional frontal headaches. Some bilateral elbow discomfort with no swelling, warmth and redness. No  pain in the small joints of her hands. Some mid back pain with some lower back pain.  Does complain of bilateral knee pain and swelling worse in  the right knee than the left knee. Occasional buckling of the knees. Bilateral ankle swelling with no associated  pain.    Quality:  Deep achy pain.   Modifying Factors:  End of the day the pain and stiffness is the worst.    Associated Symptoms:  AM Stiffness: 1 hour; Intermittent tingling and numbness from the shoulders down to the fingertips into the thumb and index  fingers of both hands. No radiating pain down the arms. Has a weak grip with difficulty opening jars and some difficulty buttoning and unbuttoning.    Fatigue: 6/10   MDHAQ: 1.4   History Reviewed:      Denies any malar rash, no alopecia, no oral ulcers. Denies any dry eyes or dry mouth. Some dysphagia to solids but no liquids . Denies any h/o DVT's, PE's, or miscarriages. No h/o thyroid problems.  Has h/o anemia and was told to start folic acid. No increased sun sensitivity. No history of Raynaud's.          Past Medical History     Past Medical History:        Diagnosis  Date         ?  Abdominal pain  07/22/2015     ?  Anemia       ?  Back pain       ?  Bursitis of shoulder       ?  CAD (coronary artery disease)       ?  Cerumen impaction       ?  Chest pain, atypical  07/22/2015     ?  Diabetes ([place])            no meds, A1c 09/28/17 6.6; avg bs 105; denies ss of hypo         ?  Diabetes mellitus out of control [facility])       ?  Endocrine disease            thyroid         ?  Fatigue       ?  GERD  (gastroesophageal reflux disease)            controlled with medicaitons         ?  Hip pain, right       ?  Hyperlipidemia, mild       ?  Hypertension            managed with medication         ?  Hypertension associated with diabetes ([place])  07/22/2015     ?  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  Left carotid bruit       ?  Lumbago  07/22/2015     ?  Morbid obesity ([place])  07/22/2015          BMI 35.3         ?  OSA on CPAP  07/22/2015          uses CPAP         ?  Other ill-defined conditions(799.89)            cholesterol         ?  Other ill-defined conditions(799.89)            heart cath 96         ?  Shoulder pain, acute       ?  Shoulder pain, bilateral       ?  Sinusitis, acute maxillary       ?  Unstable angina ([place])  07/06/2015     ?  Vitamin D deficiency           ?  Weakness             Past Surgical History     Past Surgical History:         Procedure  Laterality  Date          ?  COLONOSCOPY  N/A  03/01/2018          COLONOSCOPY/BMI 39 performed by Mazanec, Dorene Ar, MD at [facility] ENDOSCOPY          ?  HX COLONOSCOPY         ?  HX HEART CATHETERIZATION    2016          without intervention          ?  HX HEENT              goiter/thyroid surgery          ?  HX THYROIDECTOMY               Family History     Family History         Problem  Relation  Age of Onset          ?  Stroke  Sister       ?  Cancer  Sister                cervical          ?  Stroke  Brother       ?  Diabetes  Other            ?  Hypertension  Other              Social History     Social History          Socioeconomic History         ?  Marital status:  SINGLE              Spouse name:  Not on file         ?  Number of children:  Not on file     ?  Years of education:  Not on file     ?  Highest education level:  Not on file       Occupational History        ?  Not on file       Social Needs         ?  Financial resource strain:  Not on file        ?  Food insecurity:              Worry:  Not on file          Inability:  Not on file        ?  Transportation needs:              Medical:  Not on file         Non-medical:  Not on file       Tobacco Use         ?  Smoking status:  Former Smoker              Last attempt to quit:  1971         Years since  quitting:  48.6         ?  Smokeless tobacco:  Never Used        ?  Tobacco comment: quit in 1971       Substance and Sexual Activity         ?  Alcohol use:  No     ?  Drug use:  No     ?  Sexual activity:  Not on file       Lifestyle        ?  Physical activity:              Days per week:  Not on file         Minutes per session:  Not on file         ?  Stress:  Not on file       Relationships        ?  Social connections:              Talks on phone:  Not on file         Gets together:  Not on file         Attends religious service:  Not on file         Active member of club or organization:  Not on file         Attends meetings of clubs or organizations:  Not on file         Relationship status:  Not on file        ?  Intimate partner violence:              Fear of current or ex partner:  Not on file         Emotionally abused:  Not on file         Physically abused:  Not on file         Forced sexual activity:  Not on file        Other Topics  Concern        ?  Not on file       Social History Narrative          ** Merged History Encounter **                          Allergy:     Allergies        Allergen  Reactions         ?  Ativan [Lorazepam]  Other (comments)             Made her feel crazy and chest pressure         ?  Ativan [Lorazepam]  Other (comments)             Chest fullness         ?  Other Medication  Palpitations             Some type of decongestant         ?  Other Medication  Other (comments)             Doesn't take decongestants, but can't recall why              Current Medications:     Current Outpatient Medications          Medication  Sig  Dispense  Refill           ?  meloxicam (MOBIC) 15 mg tablet  Take 15 mg by mouth daily.         ?   ondansetron hcl (ZOFRAN) 4 mg tablet  Take 1 Tab by mouth every six (6) hours as needed for Nausea.  15 Tab  0     ?  pravastatin (PRAVACHOL) 80 mg tablet  TK 1 T PO QPM    1     ?  lisinopril-hydroCHLOROthiazide (PRINZIDE, ZESTORETIC) 20-12.5 mg per tablet  Take 1 Tab by mouth daily.  90 Tab  3     ?  famotidine (PEPCID) 20 mg tablet  take 1 tablet by mouth once daily  90 Tab  3     ?  nitroglycerin (NITROSTAT) 0.4 mg SL tablet  Place 1 sl under the tongue q 5 min prn cp, max 3 sl in a 15-min time period. Call 911 if no relief after the 3rd sl.  1 Bottle  prn           ?  aspirin 81 mg chewable tablet  Take 1 Tab by mouth daily.  30 Tab  11              REVIEW OF SYSTEMS: The complete review of systems (14 systems reviewed) questionnaire was reviewed today, discussed with patient, and scanned into patient's chart. The review of systems is negative  except for the following:weight loss of about 40lbs with in a year,fatigue,pain,itching eyes,ringing in ears,difficulity in swallowing,pain in chest,HTN,SOB,swollen legs/feet,cough,wheezing,stomach pain relieved by food or milk,increasing constipation,getting  up at night to pass urine,joint pain and swelling, muscle weakness and tenderness,headaches,difficulity staying asleep.            Physical Exam:   Blood pressure 120/70, height 4' 11"  (1.499 m), weight 181 lb 12.8 oz (82.5 kg).   General:  Patient alert, cooperative and in no apparent distress.   HEENT: Pupils equally reactive to light and accomodation, no scleral injection. Neck supple, no lymphadenopathy, no thyromegaly.    Skin:  No rashes. No nail abnormalities.   Cardiac:  Normal rate and rhythm.  No rubs or gallops appreciated.   Lungs: Clear to auscultation bilaterally.   Abdomen: Soft, minimal right upper quadrant, epigastric and left lower quadrant tenderness with no rebound or guarding.  No hepatosplenomegaly noted.   Neurologic:  Oriented, normal speech and affect.  Normal gait.     Extremities:  No  edema in bilateral lower extremities with no cyanosis or clubbing.         Muskoskeletal Exam:       I examined the neck, spine, shoulders, elbows, wrists, MCPs, PIPs, DIPs, knees, hips, ankles and feet bilaterally for strength, range of motion, deformity, tenderness, swelling, and synovitis.        The findings are: Does have tenderness on palpation of the IP joint of the right thumb with no swelling, warmth or redness.  Her ability to flex and extend the fingers in both hands is intact.  Does have tenderness on palpation of the shoulders both anteriorly  as well as superiorly with limited abduction to greater than 75 degrees in both the shoulders with limited internal rotation as well.   Does have tenderness on palpation of the C-spine as well as the paraspinal muscles of the neck with tenderness on palpation of the T-spine as well as the L-spine with bilateral SI joint tenderness.  Does have tenderness on palpation of the right knee  in the mid joint line with associated synovitis but  no overlying warmth or redness.  No tenderness on palpation of the left knee in the mid joint line but does have trace synovitis with no overlying warmth or redness though.  Does have tenderness on palpation  of the ankles laterally worse in the right than the left with no warmth or redness.  Does have metatarsalgia involving the right foot with tenderness on palpation of the right 1st-5th MTP joints with no synovitis, warmth or redness.  No metatarsalgia  involving the left foot with no tenderness, synovitis, warmth or redness involving the left first of fifth MTP joints.         Patient otherwise has a normal joint exam without other evidence of joint tenderness, synovitis, warmth, erythema, decreased ROM, weakness or deformities.          Radiology Reports Reviewed (if available):  Last 3 months   Xr Chest Pa Lat      Result Date: 04/02/2018   History: Chest Pain Two views chest COMPARISON: 07/31/2017 Findings: The lungs are well  expanded and clear. The cardiac silhouette is persistently enlarged, and the mediastinal contour, and osseous structures are stable.       Impression: Stable two-view chest.       Ct Urogram Wo Cont      Result Date: 04/02/2018   CT ABDOMEN AND PELVIS WITHOUT CONTRAST HISTORY: flank pain, 77 years Female Left flank pain Urinary frequency COMPARISON: CT abdomen March 20, 2008 TECHNIQUE: No oral or intravenous contrast administered. Axial helical CT images were obtained from above  the diaphragm through the pelvis.  Radiation dose reduction techniques were used for this study:  Our CT scanners use one or all of the following: Automated exposure control, adjustment of the mA and/or kVp according to patient's size, iterative reconstruction.  FINDINGS: ABDOMEN: Minimal dependent subsegmental atelectasis bilateral lung bases.  New small pericardial effusion partially visualized.  Normal-appearing liver, gallbladder, spleen, pancreas. Mild fullness of the bilateral adrenal glands unchanged,  nonspecific.  Normal appearance of the bilateral kidneys.  There is no evidence of hydronephrosis or nephrolithiasis.  Mild calcific atherosclerosis of a normal caliber abdominal aorta.  No evidence of significant lymphadenopathy. Normal-appearing small  bowel.  No evidence of intraperitoneal free air or free fluid. PELVIS: Normal-appearing urinary bladder.  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  Normal-appearing bilateral adnexa. Normal-appearing colon and  appendix.  No evidence of pelvic free fluid.  No evidence of significant inguinal or pelvic sidewall lymphadenopathy.  Visualized osseous structures unremarkable.       IMPRESSION: 1.  No acute intra-abdominal pathology identified. 2.  New small pericardial effusion partially visualized. 3.  Other chronic findings as above.          Lab Reports Reviewed (if available): Last 3 months        Results for orders placed or performed during the  hospital encounter of 04/02/18     CBC W/O DIFF         Result  Value  Ref Range            WBC  11.0  4.3 - 11.1 K/uL       RBC  3.33 (L)  4.05 - 5.2 M/uL       HGB  8.8 (L)  11.7 - 15.4 g/dL       HCT  28.0 (L)  35.8 - 46.3 %       MCV  84.1  79.6 - 97.8 FL  MCH  26.4  26.1 - 32.9 PG       MCHC  31.4  31.4 - 35.0 g/dL       RDW  15.5 (H)  11.9 - 14.6 %       PLATELET  240  150 - 450 K/uL       MPV  10.2  9.4 - 12.3 FL       ABSOLUTE NRBC  0.00  0.0 - 0.2 K/uL       METABOLIC PANEL, COMPREHENSIVE         Result  Value  Ref Range            Sodium  135 (L)  136 - 145 mmol/L       Potassium  3.7  3.5 - 5.1 mmol/L       Chloride  100  98 - 107 mmol/L       CO2  27  21 - 32 mmol/L       Anion gap  8  7 - 16 mmol/L       Glucose  102 (H)  65 - 100 mg/dL       BUN  9  8 - 23 MG/DL       Creatinine  1.04 (H)  0.6 - 1.0 MG/DL       GFR est AA  >60  >60 ml/min/1.44m       GFR est non-AA  54 (L)  >60 ml/min/1.788m      Calcium  8.8  8.3 - 10.4 MG/DL       Bilirubin, total  0.5  0.2 - 1.1 MG/DL       ALT (SGPT)  16  12 - 65 U/L       AST (SGOT)  18  15 - 37 U/L       Alk. phosphatase  65  50 - 136 U/L       Protein, total  9.1 (H)  6.3 - 8.2 g/dL       Albumin  2.9 (L)  3.2 - 4.6 g/dL       Globulin  6.2 (H)  2.3 - 3.5 g/dL       A-G Ratio  0.5 (L)  1.2 - 3.5         URINALYSIS W/ RFLX MICROSCOPIC         Result  Value  Ref Range            Color  YELLOW          Appearance  CLEAR          Specific gravity  1.011  1.001 - 1.023         pH (UA)  7.0  5.0 - 9.0         Protein  NEGATIVE   NEG mg/dL       Glucose  NEGATIVE   mg/dL       Ketone  NEGATIVE   NEG mg/dL       Bilirubin  NEGATIVE   NEG         Blood  NEGATIVE   NEG         Urobilinogen  1.0  0.2 - 1.0 EU/dL       Nitrites  NEGATIVE   NEG         Leukocyte Esterase  NEGATIVE   NEG         LIPASE         Result  Value  Ref Range            Lipase  86  73 - 393 U/L       EKG, 12 LEAD, INITIAL         Result  Value  Ref Range            Ventricular Rate  86  BPM        Atrial Rate  86  BPM       P-R Interval  192  ms       QRS Duration  86  ms       Q-T Interval  382  ms       QTC Calculation (Bezet)  457  ms       Calculated P Axis  15  degrees       Calculated R Axis  -3  degrees       Calculated T Axis  40  degrees       Diagnosis                 Normal sinus rhythm   Normal ECG   When compared with ECG of 31-Jul-2017 17:09,   PR interval has decreased   Nonspecific T wave abnormality no longer evident in Lateral leads   Confirmed by BITTRICK  MD (UC), Orland Penman ([id number]) on 04/03/2018 8:31:23 AM                 The results above were reviewed and discussed with patient.           Assessment/Plan:    Labrisha Wuellner is a 77 y.o.  female who presents with:       1. Polyarthralgia: With regard to polyarthralgias with no evidence of active synovitis suggestive of arthritis I did instruct the patient to continue meloxicam 15 mg once a  day for now.  I did counsel her to avoid any over-the-counter NSAID's while on meloxicam.  Patient did verbalize understanding.  I did not feel the need to start the patient on an oral DMARD or give her information on the same.   -     C REACTIVE PROTEIN, QT   -     CYCLIC CITRUL PEPTIDE AB, IGG   -     RHEUMATOID FACTOR, QL   -     ANA, DIRECT, W/REFLEX      2. Elevated erythrocyte sedimentation rate: With regard to her elevated ESR I did proceed with checking a CRP today.  With regard to her low-grade fevers that she has experienced daily in the  evening I did instruct her that she would need to follow-up with her hematologist/oncologist and might be, be referred to an ID doctor for further work-up if all the labs that were drawn today come back negative/normal.   -     C REACTIVE PROTEIN, QT      I will see the patient back in 3 weeks time to review the labs that were drawn today with her in person.      I appreciate the consult and the opportunity to participate in the care of this patient.        Electronically signed by:   Jeffie Pollock, MD         This note was dictated using dragon voice recognition software.  It has been proofread, but there may still exist voice recognition errors that the author did not detect.

## 2018-05-05 NOTE — Addendum Note (Signed)
Addendum Note by Susy ManorMachimada, Tahsin Benyo J, MD at 05/05/18 1400                Author: Susy ManorMachimada, Desere Gwin J, MD  Service: --  Author Type: Physician       Filed: 05/10/18 0752  Encounter Date: 05/05/2018  Status: Signed          Editor: Susy ManorMachimada, Harley Mccartney J, MD (Physician)          Addended by: Susy ManorMACHIMADA, Rajendra Spiller J on: 05/10/2018 07:52 AM    Modules accepted: Level of Service

## 2018-05-05 NOTE — Progress Notes (Signed)
[facility]  Sarah Chavez, M.D.  [address], [place]. [place], [place], [place], [address]  Office : [phone], Fax: [phone]      05/05/2018    Dear Sarah Chavez,    Thank you for asking me to assist in the care of your patient Sarah Chavez who was seen in my office on 05/05/2018 for evaluation of joint pain with an elevated ESR. I have included the full consultation note below.  I will continue to follow your patient and will forward all future office visit notes to you.  If you have any questions or concerns about any aspect of your patient's care, please do not hesitate to contact me.    I look forward to continuing to care for this patient with you.    Sincerely,    Dr. Emmie Chavez          RHEUMATOLOGY INITIAL CONSULTATION NOTE  Date of Visit:  05/05/2018 2:12 PM    Patient Information:  Name:  Sarah Chavez  DOB:  11/10/1940  Age:  77 y.o.   Gender:  female    PHYSICIAN REQUESTING CONSULTATION:  Sarah Chavez    Chief Complaint:  Chief Complaint   Patient presents with   ??? New Patient   ??? Joint Pain   ??? Abnormal Lab Results         History of Present Illness:  Sarah Chavez is a 77 y.o. female who was referred for evaluation of joint pain with an elevated ESR and an elevated CRP.  On talking to the patient she states she has had joint pain for 20 years. The joint pain has gotten worse.  On talking to her futher she states that she does run a low-grade fever most of the days as well usually late in the evening.  She denies any associated chills with it.  Her current joint complaints are as mentioned below.    Medical records were thoroughly reviewed and history was also obtained from patient:      Overall, she says she is feeling "good"      Pain: 9/10  Location:  Right shoulder pain worse than the left shoulder pain. No para spinal muscle pain. Some neck stiffness with no pain with neck ROM. Has  had occasional frontal headaches. Some bilateral elbow discomfort with no swelling, warmth and redness. No pain in the small joints of her hands. Some mid back pain with some lower back pain.  Does complain of bilateral knee pain and swelling worse in the right knee than the left knee. Occasional buckling of the knees. Bilateral ankle swelling with no associated pain.   Quality:  Deep achy pain.  Modifying Factors:  End of the day the pain and stiffness is the worst.   Associated Symptoms:  AM Stiffness: 1 hour; Intermittent tingling and numbness from the shoulders down to the fingertips into the thumb and index fingers of both hands. No radiating pain down the arms. Has a weak grip with difficulty opening jars and some difficulty buttoning and unbuttoning.   Fatigue: 6/10  MDHAQ: 1.4  History Reviewed:    Denies any malar rash, no alopecia, no oral ulcers. Denies any dry eyes or dry mouth. Some dysphagia to solids but no liquids . Denies any h/o DVT's, PE's, or miscarriages. No h/o thyroid problems. Has h/o anemia and was told to start folic acid. No increased sun sensitivity. No history of Raynaud's.       Past Medical  History  Past Medical History:   Diagnosis Date   ??? Abdominal pain 07/22/2015   ??? Anemia    ??? Back pain    ??? Bursitis of shoulder    ??? CAD (coronary artery disease)    ??? Cerumen impaction    ??? Chest pain, atypical 07/22/2015   ??? Diabetes ([facility])     no meds, A1c 09/28/17 6.6; avg bs 105; denies ss of hypo   ??? Diabetes mellitus out of control ([place])    ??? Endocrine disease     thyroid   ??? Fatigue    ??? GERD (gastroesophageal reflux disease)     controlled with medicaitons   ??? Hip pain, right    ??? Hyperlipidemia, mild    ??? Hypertension     managed with medication   ??? Hypertension associated with diabetes ([facility]) 07/22/2015   ??? 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 Left carotid bruit    ??? Lumbago 07/22/2015   ??? Morbid obesity ([place]) 07/22/2015    BMI 35.3   ??? OSA on CPAP 07/22/2015     uses CPAP   ??? Other ill-defined conditions(799.89)     cholesterol   ??? Other ill-defined conditions(799.89)     heart cath 96   ??? Shoulder pain, acute    ??? Shoulder pain, bilateral    ??? Sinusitis, acute maxillary    ??? Unstable angina ([place]) 07/06/2015   ??? Vitamin D deficiency    ??? Weakness        Past Surgical History  Past Surgical History:   Procedure Laterality Date   ??? COLONOSCOPY N/A 03/01/2018    COLONOSCOPY/BMI 39 performed by Mazanec, Dorene Ar, MD at [facility] END[facility]COPY   ??? HX COLONOSCOPY     ??? HX HEART CATHETERIZATION  2016    without intervention   ??? HX HEENT      goiter/thyroid surgery   ??? HX THYROIDECTOMY         Family History  Family History   Problem Relation Age of Onset   ??? Stroke Sister    ??? Cancer Sister         cervical   ??? Stroke Brother    ??? Diabetes Other    ??? Hypertension Other         Social History  Social History     Socioeconomic History   ??? Marital status: SINGLE     Spouse name: Not on file   ??? Number of children: Not on file   ??? Years of education: Not on file   ??? Highest education level: Not on file   Occupational History   ??? Not on file   Social Needs   ??? Financial resource strain: Not on file   ??? Food insecurity:     Worry: Not on file     Inability: Not on file   ??? Transportation needs:     Medical: Not on file     Non-medical: Not on file   Tobacco Use   ??? Smoking status: Former Smoker     Last attempt to quit: 1971     Years since quitting: 48.6   ??? Smokeless tobacco: Never Used   ??? Tobacco comment: quit in 1971   Substance and Sexual Activity   ??? Alcohol use: No   ??? Drug use: No   ??? Sexual activity: Not on file   Lifestyle   ??? Physical activity:     Days per week: Not  on file     Minutes per session: Not on file   ??? Stress: Not on file   Relationships   ??? Social connections:     Talks on phone: Not on file     Gets together: Not on file     Attends religious service: Not on file     Active member of club or organization: Not on file      Attends meetings of clubs or organizations: Not on file     Relationship status: Not on file   ??? Intimate partner violence:     Fear of current or ex partner: Not on file     Emotionally abused: Not on file     Physically abused: Not on file     Forced sexual activity: Not on file   Other Topics Concern   ??? Not on file   Social History Narrative    ** Merged History Encounter **               Allergy:  Allergies   Allergen Reactions   ??? Ativan [Lorazepam] Other (comments)     Made her feel crazy and chest pressure   ??? Ativan [Lorazepam] Other (comments)     Chest fullness   ??? Other Medication Palpitations     Some type of decongestant   ??? Other Medication Other (comments)     Doesn't take decongestants, but can't recall why         Current Medications:  Current Outpatient Medications   Medication Sig Dispense Refill   ??? meloxicam (MOBIC) 15 mg tablet Take 15 mg by mouth daily.     ??? ondansetron hcl (ZOFRAN) 4 mg tablet Take 1 Tab by mouth every six (6) hours as needed for Nausea. 15 Tab 0   ??? pravastatin (PRAVACHOL) 80 mg tablet TK 1 T PO QPM  1   ??? lisinopril-hydroCHLOROthiazide (PRINZIDE, ZESTORETIC) 20-12.5 mg per tablet Take 1 Tab by mouth daily. 90 Tab 3   ??? famotidine (PEPCID) 20 mg tablet take 1 tablet by mouth once daily 90 Tab 3   ??? nitroglycerin (NITROSTAT) 0.4 mg SL tablet Place 1 sl under the tongue q 5 min prn cp, max 3 sl in a 15-min time period. Call 911 if no relief after the 3rd sl. 1 Bottle prn   ??? aspirin 81 mg chewable tablet Take 1 Tab by mouth daily. 30 Tab 11         REVIEW OF SYSTEMS: The complete review of systems (14 systems reviewed) questionnaire was reviewed today, discussed with patient, and scanned into patient's chart. The review of systems is negative except for the following:weight loss of about 40lbs with in a year,fatigue,pain,itching eyes,ringing in ears,difficulity in swallowing,pain in chest,HTN,SOB,swollen legs/feet,cough,wheezing,stomach pain relieved by  food or milk,increasing constipation,getting up at night to pass urine,joint pain and swelling, muscle weakness and tenderness,headaches,difficulity staying asleep.        Physical Exam:  Blood pressure 120/70, height '4\' 11"'  (1.499 m), weight 181 lb 12.8 oz (82.5 kg).  General:  Patient alert, cooperative and in no apparent distress.  HEENT: Pupils equally reactive to light and accomodation, no scleral injection. Neck supple, no lymphadenopathy, no thyromegaly.   Skin:  No rashes. No nail abnormalities.  Cardiac:  Normal rate and rhythm.  No rubs or gallops appreciated.  Lungs: Clear to auscultation bilaterally.  Abdomen: Soft, minimal right upper quadrant, epigastric and left lower quadrant tenderness with no rebound or guarding.  No hepatosplenomegaly  noted.  Neurologic:  Oriented, normal speech and affect.  Normal gait.    Extremities:  No edema in bilateral lower extremities with no cyanosis or clubbing.      Muskoskeletal Exam:     I examined the neck, spine, shoulders, elbows, wrists, MCPs, PIPs, DIPs, knees, hips, ankles and feet bilaterally for strength, range of motion, deformity, tenderness, swelling, and synovitis.      The findings are: Does have tenderness on palpation of the IP joint of the right thumb with no swelling, warmth or redness.  Her ability to flex and extend the fingers in both hands is intact.  Does have tenderness on palpation of the shoulders both anteriorly as well as superiorly with limited abduction to greater than 75 degrees in both the shoulders with limited internal rotation as well.  Does have tenderness on palpation of the C-spine as well as the paraspinal muscles of the neck with tenderness on palpation of the T-spine as well as the L-spine with bilateral SI joint tenderness.  Does have tenderness on palpation of the right knee in the mid joint line with associated synovitis but no overlying warmth or redness.  No tenderness on palpation  of the left knee in the mid joint line but does have trace synovitis with no overlying warmth or redness though.  Does have tenderness on palpation of the ankles laterally worse in the right than the left with no warmth or redness.  Does have metatarsalgia involving the right foot with tenderness on palpation of the right 1st-5th MTP joints with no synovitis, warmth or redness.  No metatarsalgia involving the left foot with no tenderness, synovitis, warmth or redness involving the left first of fifth MTP joints.      Patient otherwise has a normal joint exam without other evidence of joint tenderness, synovitis, warmth, erythema, decreased ROM, weakness or deformities.       Radiology Reports Reviewed (if available):  Last 3 months  Xr Chest Pa Lat    Result Date: 04/02/2018  History: Chest Pain Two views chest COMPARISON: 07/31/2017 Findings: The lungs are well expanded and clear. The cardiac silhouette is persistently enlarged, and the mediastinal contour, and osseous structures are stable.     Impression: Stable two-view chest.     Ct Urogram Wo Cont    Result Date: 04/02/2018  CT ABDOMEN AND PELVIS WITHOUT CONTRAST HISTORY: flank pain, 77 years Female Left flank pain Urinary frequency COMPARISON: CT abdomen March 20, 2008 TECHNIQUE: No oral or intravenous contrast administered. Axial helical CT images were obtained from above the diaphragm through the pelvis.  Radiation dose reduction techniques were used for this study:  Our CT scanners use one or all of the following: Automated exposure control, adjustment of the mA and/or kVp according to patient's size, iterative reconstruction. FINDINGS: ABDOMEN: Minimal dependent subsegmental atelectasis bilateral lung bases.  New small pericardial effusion partially visualized.  Normal-appearing liver, gallbladder, spleen, pancreas. Mild fullness of the bilateral adrenal glands unchanged, nonspecific.  Normal appearance of the bilateral kidneys.  There is no  evidence of hydronephrosis or nephrolithiasis.  Mild calcific atherosclerosis of a normal caliber abdominal aorta.  No evidence of significant lymphadenopathy. Normal-appearing small bowel.  No evidence of intraperitoneal free air or free fluid. PELVIS: Normal-appearing urinary bladder.  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  Normal-appearing bilateral adnexa. Normal-appearing colon and appendix.  No evidence of pelvic free fluid.  No evidence of significant inguinal or pelvic sidewall lymphadenopathy.  Visualized osseous  structures unremarkable.     IMPRESSION: 1.  No acute intra-abdominal pathology identified. 2.  New small pericardial effusion partially visualized. 3.  Other chronic findings as above.       Lab Reports Reviewed (if available): Last 3 months    Results for orders placed or performed during the hospital encounter of 04/02/18   CBC W/O DIFF   Result Value Ref Range    WBC 11.0 4.3 - 11.1 K/uL    RBC 3.33 (L) 4.05 - 5.2 M/uL    HGB 8.8 (L) 11.7 - 15.4 g/dL    HCT 28.0 (L) 35.8 - 46.3 %    MCV 84.1 79.6 - 97.8 FL    MCH 26.4 26.1 - 32.9 PG    MCHC 31.4 31.4 - 35.0 g/dL    RDW 15.5 (H) 11.9 - 14.6 %    PLATELET 240 150 - 450 K/uL    MPV 10.2 9.4 - 12.3 FL    ABSOLUTE NRBC 0.00 0.0 - 0.2 K/uL   METABOLIC PANEL, COMPREHENSIVE   Result Value Ref Range    Sodium 135 (L) 136 - 145 mmol/L    Potassium 3.7 3.5 - 5.1 mmol/L    Chloride 100 98 - 107 mmol/L    CO2 27 21 - 32 mmol/L    Anion gap 8 7 - 16 mmol/L    Glucose 102 (H) 65 - 100 mg/dL    BUN 9 8 - 23 MG/DL    Creatinine 1.04 (H) 0.6 - 1.0 MG/DL    GFR est AA >60 >60 ml/min/1.30m    GFR est non-AA 54 (L) >60 ml/min/1.776m   Calcium 8.8 8.3 - 10.4 MG/DL    Bilirubin, total 0.5 0.2 - 1.1 MG/DL    ALT (SGPT) 16 12 - 65 U/L    AST (SGOT) 18 15 - 37 U/L    Alk. phosphatase 65 50 - 136 U/L    Protein, total 9.1 (H) 6.3 - 8.2 g/dL    Albumin 2.9 (L) 3.2 - 4.6 g/dL    Globulin 6.2 (H) 2.3 - 3.5 g/dL     A-G Ratio 0.5 (L) 1.2 - 3.5     URINALYSIS W/ RFLX MICROSCOPIC   Result Value Ref Range    Color YELLOW      Appearance CLEAR      Specific gravity 1.011 1.001 - 1.023      pH (UA) 7.0 5.0 - 9.0      Protein NEGATIVE  NEG mg/dL    Glucose NEGATIVE  mg/dL    Ketone NEGATIVE  NEG mg/dL    Bilirubin NEGATIVE  NEG      Blood NEGATIVE  NEG      Urobilinogen 1.0 0.2 - 1.0 EU/dL    Nitrites NEGATIVE  NEG      Leukocyte Esterase NEGATIVE  NEG     LIPASE   Result Value Ref Range    Lipase 86 73 - 393 U/L   EKG, 12 LEAD, INITIAL   Result Value Ref Range    Ventricular Rate 86 BPM    Atrial Rate 86 BPM    P-R Interval 192 ms    QRS Duration 86 ms    Q-T Interval 382 ms    QTC Calculation (Bezet) 457 ms    Calculated P Axis 15 degrees    Calculated R Axis -3 degrees    Calculated T Axis 40 degrees    Diagnosis       Normal sinus rhythm  Normal ECG  When compared with ECG of 31-Jul-2017 17:09,  PR interval has decreased  Nonspecific T wave abnormality no longer evident in Lateral leads  Confirmed by BITTRICK  MD ([facility]), Orland Penman ([id number]) on 04/03/2018 8:31:23 AM           The results above were reviewed and discussed with patient.        Assessment/Plan:   Dajane Valli is a 77 y.o. female who presents with:     1. Polyarthralgia: With regard to polyarthralgias with no evidence of active synovitis suggestive of arthritis I did instruct the patient to continue meloxicam 15 mg once a day for now.  I did counsel her to avoid any over-the-counter NSAID's while on meloxicam.  Patient did verbalize understanding.  I did not feel the need to start the patient on an oral DMARD or give her information on the same.  -     C REACTIVE PROTEIN, QT  -     CYCLIC CITRUL PEPTIDE AB, IGG  -     RHEUMATOID FACTOR, QL  -     ANA, DIRECT, W/REFLEX    2. Elevated erythrocyte sedimentation rate: With regard to her elevated ESR I did proceed with checking a CRP today.  With regard to her low-grade  fevers that she has experienced daily in the evening I did instruct her that she would need to follow-up with her hematologist/oncologist and might be, be referred to an ID doctor for further work-up if all the labs that were drawn today come back negative/normal.  -     C REACTIVE PROTEIN, QT    I will see the patient back in 3 weeks time to review the labs that were drawn today with her in person.    I appreciate the consult and the opportunity to participate in the care of this patient.      Electronically signed by:  Sarah Pollock, MD      This note was dictated using dragon voice recognition software.  It has been proofread, but there may still exist voice recognition errors that the author did not detect.

## 2018-05-06 LAB — ANA, DIRECT, W/REFLEX
ANA, DIRECT: NEGATIVE
Antinuclear Antibodies Direct: NEGATIVE

## 2018-05-06 LAB — C REACTIVE PROTEIN, QT: C-Reactive Protein, Qt: 39 mg/L — ABNORMAL HIGH (ref 0–10)

## 2018-05-06 LAB — RHEUMATOID FACTOR, QL: Rheumatoid factor: <10 IU/mL (ref 0.0–13.9)

## 2018-05-06 LAB — C-REACTIVE PROTEIN: CRP: 39 mg/L — ABNORMAL HIGH (ref 0–10)

## 2018-05-06 LAB — RHEUMATOID FACTOR, QUALITATIVE: Rheumatoid Factor: <10 IU/mL (ref 0.0–13.9)

## 2018-05-07 LAB — CYCLIC CITRUL PEPTIDE AB, IGG
CCP ANTIBODIES IGG/IGA: 7 units (ref 0–19)
CCP Antibodies IgG/IgA: 7 units (ref 0–19)

## 2018-05-11 ENCOUNTER — Encounter

## 2018-05-12 ENCOUNTER — Ambulatory Visit: Attending: Hematology & Oncology | Primary: Family Medicine

## 2018-05-12 ENCOUNTER — Ambulatory Visit
Admit: 2018-05-12 | Discharge: 2018-05-12 | Payer: MEDICARE | Attending: Hematology & Oncology | Primary: Family Medicine

## 2018-05-12 ENCOUNTER — Encounter: Primary: Family Medicine

## 2018-05-12 ENCOUNTER — Inpatient Hospital Stay: Admit: 2018-05-12 | Payer: MEDICARE | Primary: Family Medicine

## 2018-05-12 DIAGNOSIS — R700 Elevated erythrocyte sedimentation rate: Secondary | ICD-10-CM

## 2018-05-12 DIAGNOSIS — D649 Anemia, unspecified: Secondary | ICD-10-CM

## 2018-05-12 LAB — TRANSFERRIN SATURATION
Iron: 33 ug/dL — ABNORMAL LOW (ref 35–150)
Iron: 33 ug/dL — ABNORMAL LOW (ref 35–150)
TIBC: 214 ug/dL — ABNORMAL LOW (ref 250–450)
TIBC: 214 ug/dL — ABNORMAL LOW (ref 250–450)
TRANSFERRIN SATURATION: 15 %
Transferrin Saturation: 15 %

## 2018-05-12 LAB — RETICULOCYTE COUNT
Absolute Retic Cnt.: 0.0544 M/ul (ref 0.026–0.095)
Absolute Retic Cnt.: 0.0544 M/ul (ref 0.026–0.095)
Immature Retic Fraction: 14.4 % (ref 3.0–15.9)
Immature Retic Fraction: 14.4 % (ref 3.0–15.9)
Retic Ct Pct: 1.6 % (ref 0.3–2.0)
Retic Hgb Conc.: 30 pg (ref 29–35)
Retic Hgb Conc.: 30 pg (ref 29–35)
Reticulocyte count: 1.6 % (ref 0.3–2.0)

## 2018-05-12 LAB — METABOLIC PANEL, COMPREHENSIVE
A-G Ratio: 0.5 — ABNORMAL LOW (ref 1.2–3.5)
ALT (SGPT): 15 U/L (ref 12–65)
AST (SGOT): 8 U/L — ABNORMAL LOW (ref 15–37)
Albumin: 2.9 g/dL — ABNORMAL LOW (ref 3.2–4.6)
Alk. phosphatase: 61 U/L (ref 50–136)
Anion gap: 9 mmol/L (ref 7–16)
BUN: 24 MG/DL — ABNORMAL HIGH (ref 8–23)
Bilirubin, total: 0.4 MG/DL (ref 0.2–1.1)
CO2: 26 mmol/L (ref 21–32)
Calcium: 9.4 MG/DL (ref 8.3–10.4)
Chloride: 106 mmol/L (ref 98–107)
Creatinine: 1.4 MG/DL — ABNORMAL HIGH (ref 0.6–1.0)
GFR est AA: 47 mL/min/{1.73_m2} — ABNORMAL LOW (ref >60)
GFR est non-AA: 39 mL/min/{1.73_m2} — ABNORMAL LOW (ref >60)
Globulin: 6 g/dL — ABNORMAL HIGH (ref 2.3–3.5)
Glucose: 153 mg/dL — ABNORMAL HIGH (ref 65–100)
Potassium: 3.6 mmol/L (ref 3.5–5.1)
Protein, total: 8.9 g/dL — ABNORMAL HIGH (ref 6.3–8.2)
Sodium: 141 mmol/L (ref 136–145)

## 2018-05-12 LAB — CBC WITH AUTOMATED DIFF
ABS. BASOPHILS: 0 10*3/uL (ref 0.0–0.2)
ABS. EOSINOPHILS: 0.1 10*3/uL (ref 0.0–0.8)
ABS. IMM. GRANS.: 0 10*3/uL (ref 0.0–0.5)
ABS. LYMPHOCYTES: 1.5 10*3/uL (ref 0.5–4.6)
ABS. MONOCYTES: 0.6 10*3/uL (ref 0.1–1.3)
ABS. NEUTROPHILS: 6 10*3/uL (ref 1.7–8.2)
ABSOLUTE NRBC: 0 10*3/uL (ref 0.0–0.2)
BASOPHILS: 0 % (ref 0.0–2.0)
EOSINOPHILS: 1 % (ref 0.5–7.8)
HCT: 28.4 % — ABNORMAL LOW (ref 35.8–46.3)
HGB: 8.9 g/dL — ABNORMAL LOW (ref 11.7–15.4)
IMMATURE GRANULOCYTES: 1 % (ref 0.0–5.0)
LYMPHOCYTES: 19 % (ref 13–44)
MCH: 26.2 PG (ref 26.1–32.9)
MCHC: 31.3 g/dL — ABNORMAL LOW (ref 31.4–35.0)
MCV: 83.5 FL (ref 79.6–97.8)
MONOCYTES: 7 % (ref 4.0–12.0)
MPV: 9.5 FL (ref 9.4–12.3)
NEUTROPHILS: 72 % (ref 43–78)
PLATELET: 208 10*3/uL (ref 150–450)
RBC: 3.4 M/uL — ABNORMAL LOW (ref 4.05–5.25)
RDW: 16.5 % — ABNORMAL HIGH (ref 11.9–14.6)
WBC: 8.3 10*3/uL (ref 4.3–11.1)

## 2018-05-12 LAB — FERRITIN
Ferritin: 557 NG/ML — ABNORMAL HIGH (ref 8–388)
Ferritin: 557 NG/ML — ABNORMAL HIGH (ref 8–388)

## 2018-05-12 LAB — SED RATE, AUTOMATED: Sed rate, automated: 115 mm/hr — ABNORMAL HIGH (ref 0–30)

## 2018-05-12 LAB — CBC WITH AUTO DIFFERENTIAL
Basophils %: 0 % (ref 0.0–2.0)
Basophils Absolute: 0 10*3/uL (ref 0.0–0.2)
Eosinophils %: 1 % (ref 0.5–7.8)
Eosinophils Absolute: 0.1 10*3/uL (ref 0.0–0.8)
Granulocyte Absolute Count: 0 10*3/uL (ref 0.0–0.5)
Hematocrit: 28.4 % — ABNORMAL LOW (ref 35.8–46.3)
Hemoglobin: 8.9 g/dL — ABNORMAL LOW (ref 11.7–15.4)
Immature Granulocytes: 1 % (ref 0.0–5.0)
Lymphocytes %: 19 % (ref 13–44)
Lymphocytes Absolute: 1.5 10*3/uL (ref 0.5–4.6)
MCH: 26.2 PG (ref 26.1–32.9)
MCHC: 31.3 g/dL — ABNORMAL LOW (ref 31.4–35.0)
MCV: 83.5 FL (ref 79.6–97.8)
MPV: 9.5 FL (ref 9.4–12.3)
Monocytes %: 7 % (ref 4.0–12.0)
Monocytes Absolute: 0.6 10*3/uL (ref 0.1–1.3)
NRBC Absolute: 0 10*3/uL (ref 0.0–0.2)
Neutrophils %: 72 % (ref 43–78)
Neutrophils Absolute: 6 10*3/uL (ref 1.7–8.2)
Platelets: 208 10*3/uL (ref 150–450)
RBC: 3.4 M/uL — ABNORMAL LOW (ref 4.05–5.25)
RDW: 16.5 % — ABNORMAL HIGH (ref 11.9–14.6)
WBC: 8.3 10*3/uL (ref 4.3–11.1)

## 2018-05-12 LAB — COMPREHENSIVE METABOLIC PANEL
ALT: 15 U/L (ref 12–65)
AST: 8 U/L — ABNORMAL LOW (ref 15–37)
Albumin/Globulin Ratio: 0.5 — ABNORMAL LOW (ref 1.2–3.5)
Albumin: 2.9 g/dL — ABNORMAL LOW (ref 3.2–4.6)
Alkaline Phosphatase: 61 U/L (ref 50–136)
Anion Gap: 9 mmol/L (ref 7–16)
BUN: 24 MG/DL — ABNORMAL HIGH (ref 8–23)
CO2: 26 mmol/L (ref 21–32)
Calcium: 9.4 MG/DL (ref 8.3–10.4)
Chloride: 106 mmol/L (ref 98–107)
Creatinine: 1.4 MG/DL — ABNORMAL HIGH (ref 0.6–1.0)
EGFR IF NonAfrican American: 39 mL/min/{1.73_m2} — ABNORMAL LOW (ref >60)
GFR African American: 47 mL/min/{1.73_m2} — ABNORMAL LOW (ref >60)
Globulin: 6 g/dL — ABNORMAL HIGH (ref 2.3–3.5)
Glucose: 153 mg/dL — ABNORMAL HIGH (ref 65–100)
Potassium: 3.6 mmol/L (ref 3.5–5.1)
Sodium: 141 mmol/L (ref 136–145)
Total Bilirubin: 0.4 MG/DL (ref 0.2–1.1)
Total Protein: 8.9 g/dL — ABNORMAL HIGH (ref 6.3–8.2)

## 2018-05-12 LAB — SEDIMENTATION RATE, AUTOMATED: Sed Rate: 115 mm/hr — ABNORMAL HIGH (ref 0–30)

## 2018-05-12 NOTE — Progress Notes (Signed)
Data Source: Patient, [facility] record.    05/12/2018    2:56 PM    Kelli Churn Mellissa Kohut [id number]    77 y.o.      Patient Encounter: [facility] Visit    Heme Diagnosis:  Anemia  Heme History (Copied from prior):   47 female,  ex-smoker history of CAD, DM, hypothyroidism, dyslipidemia, hypertension, obese habitus, vitamin D deficiency, more recently seen by primary care and treated for UTI.  On review of labs, patient was found to have worsening isolated anemia with hemoglobin at 10.2.  MCV and RDW normal.  Other cell lines unremarkable.  Her last normal hemoglobin was on 11/18, and this has subsequently dropped over time.  Now referred to [place] for further work-up and management. Denies any blood per rectum, change in color of stools, hematuria or vaginal bleeding. Denies ever being told of anemia in the past. Continues to report ongoing abdominal discomfort and dysuria for which she will f/u w her primary care.  Interval History:  05/12/2018: Anemia persists, in 8 range.  She has seen GI, status post EGD/Colo 03/01/2018 showing mild gastritis, small HH, small IH, tics, and benign polyp.  She has also seen rheumatology, work-up underway.  Given persistent isolated anemia, discussed BM biopsy for completion, she will think over this, and discuss further with next visit.  We will also await rheumatology work-up in interim.  NCCN Distress Score:  -    REVIEW OF SYSTEMS:  As mentioned above, all other systems were reviewed in full and are negative.    Past Medical History:   Diagnosis Date   ??? Abdominal pain 07/22/2015   ??? Anemia    ??? Back pain    ??? Bursitis of shoulder    ??? CAD (coronary artery disease)    ??? Cerumen impaction    ??? Chest pain, atypical 07/22/2015   ??? Diabetes ([place])     no meds, A1c 09/28/17 6.6; avg bs 105; denies ss of hypo   ??? Diabetes mellitus out of control ([place])    ??? Endocrine disease     thyroid   ??? Fatigue    ??? GERD (gastroesophageal reflux disease)      controlled with medicaitons   ??? Hip pain, right    ??? Hyperlipidemia, mild    ??? Hypertension     managed with medication   ??? Hypertension associated with diabetes ([place]) 07/22/2015   ??? 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 Left carotid bruit    ??? Lumbago 07/22/2015   ??? Morbid obesity ([place]) 07/22/2015    BMI 35.3   ??? OSA on CPAP 07/22/2015    uses CPAP   ??? Other ill-defined conditions(799.89)     cholesterol   ??? Other ill-defined conditions(799.89)     heart cath 96   ??? Shoulder pain, acute    ??? Shoulder pain, bilateral    ??? Sinusitis, acute maxillary    ??? Unstable angina ([place]) 07/06/2015   ??? Vitamin D deficiency    ??? Weakness        Past Surgical History:   Procedure Laterality Date   ??? COLONOSCOPY N/A 03/01/2018    COLONOSCOPY/BMI 61 performed by Mazanec, Dorene Ar, MD at [facility] ENDOSCOPY   ??? HX COLONOSCOPY     ??? HX HEART CATHETERIZATION  2016    without intervention   ??? HX HEENT      goiter/thyroid surgery   ??? HX THYROIDECTOMY  Current Outpatient Medications   Medication Sig Dispense Refill   ??? meloxicam (MOBIC) 15 mg tablet Take 15 mg by mouth daily.     ??? ondansetron hcl (ZOFRAN) 4 mg tablet Take 1 Tab by mouth every six (6) hours as needed for Nausea. 15 Tab 0   ??? pravastatin (PRAVACHOL) 80 mg tablet TK 1 T PO QPM  1   ??? lisinopril-hydroCHLOROthiazide (PRINZIDE, ZESTORETIC) 20-12.5 mg per tablet Take 1 Tab by mouth daily. 90 Tab 3   ??? famotidine (PEPCID) 20 mg tablet take 1 tablet by mouth once daily 90 Tab 3   ??? nitroglycerin (NITROSTAT) 0.4 mg SL tablet Place 1 sl under the tongue q 5 min prn cp, max 3 sl in a 15-min time period. Call 911 if no relief after the 3rd sl. 1 Bottle prn   ??? aspirin 81 mg chewable tablet Take 1 Tab by mouth daily. 30 Tab 11       Social History     Socioeconomic History   ??? Marital status: SINGLE     Spouse name: Not on file   ??? Number of children: Not on file   ??? Years of education: Not on file   ??? Highest education level: Not on file   Tobacco Use    ??? Smoking status: Former Smoker     Last attempt to quit: 1971     Years since quitting: 48.7   ??? Smokeless tobacco: Never Used   ??? Tobacco comment: quit in 1971   Substance and Sexual Activity   ??? Alcohol use: No   ??? Drug use: No   Social History Narrative    ** Merged History Encounter **            Family History   Problem Relation Age of Onset   ??? Stroke Sister    ??? Cancer Sister         cervical   ??? Stroke Brother    ??? Diabetes Other    ??? Hypertension Other        Allergies   Allergen Reactions   ??? Ativan [Lorazepam] Other (comments)     Made her feel crazy and chest pressure   ??? Ativan [Lorazepam] Other (comments)     Chest fullness   ??? Other Medication Palpitations     Some type of decongestant   ??? Other Medication Other (comments)     Doesn't take decongestants, but can't recall why       PHYSICAL EXAMINATION:  General Appearance: Healthy appearing patient in no acute distress  Vitals reviewed.   Visit Vitals  BP 134/62 (BP 1 Location: Left arm, BP Patient Position: Standing)   Pulse 92   Temp 98.2 ??F (36.8 ??C) (Oral)   Resp 16   Wt 180 lb (81.6 kg)   SpO2 99%   BMI 36.36 kg/m??     HEENT: No oral or pharyngeal masses, ulceration or thrush noted, no sinus tenderness.  Neck is supple with no thyromegaly or JVD noted.  Lymph Nodes: No lymphadenopathy noted in the occipital, pre and post auricular, cervical, supra and infraclavicular, axillary, epitrochlear, inguinal, and popliteal region.  Breasts: No palpable masses, nipple discharge or skin retraction  Lungs/Thorax: Clear to auscultation, no accessory muscles of respiration being used.  Heart: Regular rate and rhythm, normal S1, S2, no appreciable murmurs, rubs, gallops  Abdomen: Soft, nontender, bowel sounds present, no appreciable hepatosplenomegaly, no palpable masses  Extremeties: Good pulses bilaterally, no peripheral edema.  Skin:  Normal skin tone with no rash, petechiae, ecchymosis noted.   Musculoskeletal: No pain on palpation over bony prominence, no edema, no evidence of gout, no joint or bony deformity  Neurologic: Grossly intact        LABS/IMAGING:    Lab Results   Component Value Date/Time    WBC 8.3 05/12/2018 01:09 PM    HGB 8.9 (L) 05/12/2018 01:09 PM    HCT 28.4 (L) 05/12/2018 01:09 PM    PLATELET 208 05/12/2018 01:09 PM    MCV 83.5 05/12/2018 01:09 PM       Lab Results   Component Value Date/Time    Sodium 135 (L) 04/02/2018 11:53 AM    Potassium 3.7 04/02/2018 11:53 AM    Chloride 100 04/02/2018 11:53 AM    CO2 27 04/02/2018 11:53 AM    Anion gap 8 04/02/2018 11:53 AM    Glucose 102 (H) 04/02/2018 11:53 AM    BUN 9 04/02/2018 11:53 AM    Creatinine 1.04 (H) 04/02/2018 11:53 AM    BUN/Creatinine ratio 16 09/28/2017 10:32 AM    GFR est AA >60 04/02/2018 11:53 AM    GFR est non-AA 54 (L) 04/02/2018 11:53 AM    Calcium 8.8 04/02/2018 11:53 AM    AST (SGOT) 18 04/02/2018 11:53 AM    Alk. phosphatase 65 04/02/2018 11:53 AM    Protein, total 9.1 (H) 04/02/2018 11:53 AM    Albumin 2.9 (L) 04/02/2018 11:53 AM    Globulin 6.2 (H) 04/02/2018 11:53 AM    A-G Ratio 0.5 (L) 04/02/2018 11:53 AM    ALT (SGPT) 16 04/02/2018 11:53 AM             Above results reviewed with patient.    ASSESSMENT:  29 female,  ex-smoker history of CAD, DM, hypothyroidism, dyslipidemia, hypertension, obese habitus, vitamin D deficiency, more recently seen by primary care and treated for UTI.  On review of labs, patient was found to have worsening isolated anemia with hemoglobin at 10.2.  MCV and RDW normal.  Other cell lines unremarkable.  Her last normal hemoglobin was on 11/18, and this has subsequently dropped over time.  Now referred to [place] for further work-up and management. Denies any blood per rectum, change in color of stools, hematuria or vaginal bleeding. Denies ever being told of anemia in the past. Continues to report ongoing abdominal discomfort and dysuria for which she will f/u w her primary care.     Will initiate w/u as below:    02/16/2018: Ongoing isolated anemia with hemoglobin in 8 range.  Extensive blood work from last visit reviewed: Iron stores appear adequate.  Denies bleeding. No evidence of hemolysis.  B12 and folate normal range however HC elevated in setting of normal MMA: Will start on folic acid 1 mg daily.  Unremarkable thyroid function.  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  Significantly her ESR is over 110 with CRP over 7.  Concern for underlying vasculitis: Will refer to rheumatology.  Her anemia is likely related to chronic disease.  Will also refer to GI for completion.    05/12/2018: Anemia persists, in 8 range.  She has seen GI, status post EGD/Colo 03/01/2018 showing mild gastritis, small HH, small IH, tics, and benign polyp.  She has also seen rheumatology, work-up underway.  Given persistent isolated anemia, discussed BM biopsy for completion, she will think over this, and discuss further with next visit.  We will also await rheumatology work-up  in interim.        1. 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 Folic acid deficiency (High HC, normal MMA)  3. 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 Seeing rheumatology.   - Continue folic acid  - F/u w GI and gyn as needed    RTC in approx 3 months w labs, iron panel, ESR, rheumatology records. Will further discuss BM biopsy at that time if hgb no better.       Total time 25 min 50% in direct consultation about the patient's diagnosis and management. All questions answered.  Letha Cape, MD  [facility]  [facility]  [address] [place] [address]  Office : [phone]  Fax : [phone]

## 2018-05-12 NOTE — Progress Notes (Signed)
Data Source: Patient, [facility] record.    05/12/2018    2:56 PM    Sarah Chavez [id number]    77 y.o.      Patient Encounter: [facility] Visit    Heme Diagnosis:  Anemia  Heme History (Copied from prior):   69 female,  ex-smoker history of CAD, DM, hypothyroidism, dyslipidemia, hypertension, obese habitus, vitamin D deficiency, more recently seen by primary care and treated for UTI.  On review of labs, patient was found to have worsening isolated anemia with hemoglobin at 10.2.  MCV and RDW normal.  Other cell lines unremarkable.  Her last normal hemoglobin was on 11/18, and this has subsequently dropped over time.  Now referred to [place] for further work-up and management. Denies any blood per rectum, change in color of stools, hematuria or vaginal bleeding. Denies ever being told of anemia in the past. Continues to report ongoing abdominal discomfort and dysuria for which she will f/u w her primary care.  Interval History:  05/12/2018: Anemia persists, in 8 range.  She has seen GI, status post EGD/Colo 03/01/2018 showing mild gastritis, small HH, small IH, tics, and benign polyp.  She has also seen rheumatology, work-up underway.  Given persistent isolated anemia, discussed BM biopsy for completion, she will think over this, and discuss further with next visit.  We will also await rheumatology work-up in interim.  NCCN Distress Score:  -    REVIEW OF SYSTEMS:  As mentioned above, all other systems were reviewed in full and are negative.    Past Medical History:   Diagnosis Date   ??? Abdominal pain 07/22/2015   ??? Anemia    ??? Back pain    ??? Bursitis of shoulder    ??? CAD (coronary artery disease)    ??? Cerumen impaction    ??? Chest pain, atypical 07/22/2015   ??? Diabetes ([place])     no meds, A1c 09/28/17 6.6; avg bs 105; denies ss of hypo   ??? Diabetes mellitus out of control ([place])    ??? Endocrine disease     thyroid   ??? Fatigue    ??? GERD (gastroesophageal reflux disease)     controlled with  medicaitons   ??? Hip pain, right    ??? Hyperlipidemia, mild    ??? Hypertension     managed with medication   ??? Hypertension associated with diabetes ([place]) 07/22/2015   ??? 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 Left carotid bruit    ??? Lumbago 07/22/2015   ??? Morbid obesity ([place]) 07/22/2015    BMI 35.3   ??? OSA on CPAP 07/22/2015    uses CPAP   ??? Other ill-defined conditions(799.89)     cholesterol   ??? Other ill-defined conditions(799.89)     heart cath 96   ??? Shoulder pain, acute    ??? Shoulder pain, bilateral    ??? Sinusitis, acute maxillary    ??? Unstable angina ([place]) 07/06/2015   ??? Vitamin D deficiency    ??? Weakness        Past Surgical History:   Procedure Laterality Date   ??? COLONOSCOPY N/A 03/01/2018    COLONOSCOPY/BMI 64 performed by Mazanec, Dorene Ar, MD at [facility] ENDOSCOPY   ??? HX COLONOSCOPY     ??? HX HEART CATHETERIZATION  2016    without intervention   ??? HX HEENT      goiter/thyroid surgery   ??? HX THYROIDECTOMY  Current Outpatient Medications   Medication Sig Dispense Refill   ??? meloxicam (MOBIC) 15 mg tablet Take 15 mg by mouth daily.     ??? ondansetron hcl (ZOFRAN) 4 mg tablet Take 1 Tab by mouth every six (6) hours as needed for Nausea. 15 Tab 0   ??? pravastatin (PRAVACHOL) 80 mg tablet TK 1 T PO QPM  1   ??? lisinopril-hydroCHLOROthiazide (PRINZIDE, ZESTORETIC) 20-12.5 mg per tablet Take 1 Tab by mouth daily. 90 Tab 3   ??? famotidine (PEPCID) 20 mg tablet take 1 tablet by mouth once daily 90 Tab 3   ??? nitroglycerin (NITROSTAT) 0.4 mg SL tablet Place 1 sl under the tongue q 5 min prn cp, max 3 sl in a 15-min time period. Call 911 if no relief after the 3rd sl. 1 Bottle prn   ??? aspirin 81 mg chewable tablet Take 1 Tab by mouth daily. 30 Tab 11       Social History     Socioeconomic History   ??? Marital status: SINGLE     Spouse name: Not on file   ??? Number of children: Not on file   ??? Years of education: Not on file   ??? Highest education level: Not on file   Tobacco Use   ??? Smoking status: Former  Smoker     Last attempt to quit: 1971     Years since quitting: 48.7   ??? Smokeless tobacco: Never Used   ??? Tobacco comment: quit in 1971   Substance and Sexual Activity   ??? Alcohol use: No   ??? Drug use: No   Social History Narrative    ** Merged History Encounter **            Family History   Problem Relation Age of Onset   ??? Stroke Sister    ??? Cancer Sister         cervical   ??? Stroke Brother    ??? Diabetes Other    ??? Hypertension Other        Allergies   Allergen Reactions   ??? Ativan [Lorazepam] Other (comments)     Made her feel crazy and chest pressure   ??? Ativan [Lorazepam] Other (comments)     Chest fullness   ??? Other Medication Palpitations     Some type of decongestant   ??? Other Medication Other (comments)     Doesn't take decongestants, but can't recall why       PHYSICAL EXAMINATION:  General Appearance: Healthy appearing patient in no acute distress  Vitals reviewed.   Visit Vitals  BP 134/62 (BP 1 Location: Left arm, BP Patient Position: Standing)   Pulse 92   Temp 98.2 ??F (36.8 ??C) (Oral)   Resp 16   Wt 180 lb (81.6 kg)   SpO2 99%   BMI 36.36 kg/m??     HEENT: No oral or pharyngeal masses, ulceration or thrush noted, no sinus tenderness.  Neck is supple with no thyromegaly or JVD noted.  Lymph Nodes: No lymphadenopathy noted in the occipital, pre and post auricular, cervical, supra and infraclavicular, axillary, epitrochlear, inguinal, and popliteal region.  Breasts: No palpable masses, nipple discharge or skin retraction  Lungs/Thorax: Clear to auscultation, no accessory muscles of respiration being used.  Heart: Regular rate and rhythm, normal S1, S2, no appreciable murmurs, rubs, gallops  Abdomen: Soft, nontender, bowel sounds present, no appreciable hepatosplenomegaly, no palpable masses  Extremeties: Good pulses bilaterally, no peripheral edema.  Skin:  Normal skin tone with no rash, petechiae, ecchymosis noted.  Musculoskeletal: No pain on palpation over bony prominence, no edema, no evidence of  gout, no joint or bony deformity  Neurologic: Grossly intact        LABS/IMAGING:    Lab Results   Component Value Date/Time    WBC 8.3 05/12/2018 01:09 PM    HGB 8.9 (L) 05/12/2018 01:09 PM    HCT 28.4 (L) 05/12/2018 01:09 PM    PLATELET 208 05/12/2018 01:09 PM    MCV 83.5 05/12/2018 01:09 PM       Lab Results   Component Value Date/Time    Sodium 135 (L) 04/02/2018 11:53 AM    Potassium 3.7 04/02/2018 11:53 AM    Chloride 100 04/02/2018 11:53 AM    CO2 27 04/02/2018 11:53 AM    Anion gap 8 04/02/2018 11:53 AM    Glucose 102 (H) 04/02/2018 11:53 AM    BUN 9 04/02/2018 11:53 AM    Creatinine 1.04 (H) 04/02/2018 11:53 AM    BUN/Creatinine ratio 16 09/28/2017 10:32 AM    GFR est AA >60 04/02/2018 11:53 AM    GFR est non-AA 54 (L) 04/02/2018 11:53 AM    Calcium 8.8 04/02/2018 11:53 AM    AST (SGOT) 18 04/02/2018 11:53 AM    Alk. phosphatase 65 04/02/2018 11:53 AM    Protein, total 9.1 (H) 04/02/2018 11:53 AM    Albumin 2.9 (L) 04/02/2018 11:53 AM    Globulin 6.2 (H) 04/02/2018 11:53 AM    A-G Ratio 0.5 (L) 04/02/2018 11:53 AM    ALT (SGPT) 16 04/02/2018 11:53 AM             Above results reviewed with patient.    ASSESSMENT:  74 female,  ex-smoker history of CAD, DM, hypothyroidism, dyslipidemia, hypertension, obese habitus, vitamin D deficiency, more recently seen by primary care and treated for UTI.  On review of labs, patient was found to have worsening isolated anemia with hemoglobin at 10.2.  MCV and RDW normal.  Other cell lines unremarkable.  Her last normal hemoglobin was on 11/18, and this has subsequently dropped over time.  Now referred to [place] for further work-up and management. Denies any blood per rectum, change in color of stools, hematuria or vaginal bleeding. Denies ever being told of anemia in the past. Continues to report ongoing abdominal discomfort and dysuria for which she will f/u w her primary care.    Will initiate w/u as below:    02/16/2018: Ongoing isolated anemia with hemoglobin in 8 range.   Extensive blood work from last visit reviewed: Iron stores appear adequate.  Denies bleeding. No evidence of hemolysis.  B12 and folate normal range however HC elevated in setting of normal MMA: Will start on folic acid 1 mg daily.  Unremarkable thyroid function.  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  Significantly her ESR is over 110 with CRP over 7.  Concern for underlying vasculitis: Will refer to rheumatology.  Her anemia is likely related to chronic disease.  Will also refer to GI for completion.    05/12/2018: Anemia persists, in 8 range.  She has seen GI, status post EGD/Colo 03/01/2018 showing mild gastritis, small HH, small IH, tics, and benign polyp.  She has also seen rheumatology, work-up underway.  Given persistent isolated anemia, discussed BM biopsy for completion, she will think over this, and discuss further with next visit.  We will also await rheumatology work-up  in interim.        1. 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 Folic acid deficiency (High HC, normal MMA)  3. 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 Seeing rheumatology.   - Continue folic acid  - F/u w GI and gyn as needed    RTC in approx 3 months w labs, iron panel, ESR, rheumatology records. Will further discuss BM biopsy at that time if hgb no better.       Total time 25 min 50% in direct consultation about the patient's diagnosis and management. All questions answered.  Letha Cape, MD  [facility]  [facility]  [address] [place] [address]  Office : [phone]  Fax : [phone]

## 2018-05-12 NOTE — Patient Instructions (Signed)
Patient Instructions from Today's Visit    Reason for Visit:  Follow up    Plan:  None of the lab work we did     Follow Up:  Follow up in 3 months    Recent Lab Results:  Recent Results (from the past 12 hour(s))   CBC WITH AUTOMATED DIFF    Collection Time: 05/12/18  1:09 PM   Result Value Ref Range    WBC 8.3 4.3 - 11.1 K/uL    RBC 3.40 (L) 4.05 - 5.25 M/uL    HGB 8.9 (L) 11.7 - 15.4 g/dL    HCT 42.8 (L) 76.8 - 46.3 %    MCV 83.5 79.6 - 97.8 FL    MCH 26.2 26.1 - 32.9 PG    MCHC 31.3 (L) 31.4 - 35.0 g/dL    RDW 11.5 (H) 72.6 - 14.6 %    PLATELET 208 150 - 450 K/uL    MPV 9.5 9.4 - 12.3 FL    ABSOLUTE NRBC 0.00 0.0 - 0.2 K/uL    DF AUTOMATED      NEUTROPHILS 72 43 - 78 %    LYMPHOCYTES 19 13 - 44 %    MONOCYTES 7 4.0 - 12.0 %    EOSINOPHILS 1 0.5 - 7.8 %    BASOPHILS 0 0.0 - 2.0 %    IMMATURE GRANULOCYTES 1 0.0 - 5.0 %    ABS. NEUTROPHILS 6.0 1.7 - 8.2 K/UL    ABS. LYMPHOCYTES 1.5 0.5 - 4.6 K/UL    ABS. MONOCYTES 0.6 0.1 - 1.3 K/UL    ABS. EOSINOPHILS 0.1 0.0 - 0.8 K/UL    ABS. BASOPHILS 0.0 0.0 - 0.2 K/UL    ABS. IMM. GRANS. 0.0 0.0 - 0.5 K/UL   RETICULOCYTE COUNT    Collection Time: 05/12/18  1:09 PM   Result Value Ref Range    Reticulocyte count 1.6 0.3 - 2.0 %    Absolute Retic Cnt. 0.0544 0.026 - 0.095 M/ul    Immature Retic Fraction 14.4 3.0 - 15.9 %    Retic Hgb Conc. 30 29 - 35 pg       Treatment Summary has been discussed and given to patient: n/a        -------------------------------------------------------------------------------------------------------------------  Please call our office at [phone] if you have any  of the following symptoms:   ?? Fever of 100.5 or greater  ?? Chills  ?? Shortness of breath  ?? Swelling or pain in one leg    After office hours an answering service is available and will contact a provider for emergencies or if you are experiencing any of the above symptoms.    ? Patient did express an interest in My Chart.  My Chart log in  information explained on the after visit summary printout at the check-out desk.    Weyman Croon, RN

## 2018-05-26 ENCOUNTER — Encounter: Attending: Family Medicine | Primary: Family Medicine

## 2018-05-27 ENCOUNTER — Encounter: Attending: Rheumatology | Primary: Family Medicine

## 2018-06-01 ENCOUNTER — Ambulatory Visit: Attending: Cardiovascular Disease | Primary: Family Medicine

## 2018-06-01 ENCOUNTER — Other Ambulatory Visit: Admit: 2018-06-01 | Discharge: 2018-06-01 | Payer: MEDICARE | Primary: Family Medicine

## 2018-06-01 ENCOUNTER — Ambulatory Visit
Admit: 2018-06-01 | Discharge: 2018-06-01 | Payer: MEDICARE | Attending: Cardiovascular Disease | Primary: Family Medicine

## 2018-06-01 DIAGNOSIS — I10 Essential (primary) hypertension: Secondary | ICD-10-CM

## 2018-06-01 DIAGNOSIS — E559 Vitamin D deficiency, unspecified: Secondary | ICD-10-CM

## 2018-06-01 MED ORDER — PRAVASTATIN 80 MG TAB
80 mg | ORAL_TABLET | Freq: Every day | ORAL | 3 refills | Status: DC
Start: 2018-06-01 — End: 2018-08-16

## 2018-06-01 MED ORDER — LISINOPRIL-HYDROCHLOROTHIAZIDE 20 MG-12.5 MG TAB
ORAL_TABLET | Freq: Every day | ORAL | 3 refills | Status: DC
Start: 2018-06-01 — End: 2018-08-16

## 2018-06-01 NOTE — Progress Notes (Signed)
[address], [address]  [place], [place] [address]  PHONE: [phone]    Sarah Chavez  [birth year]      SUBJECTIVE:   Sarah Chavez is a 77 y.o. female seen for a follow up visit regarding the following:     Chief Complaint   Patient presents with   ??? Coronary Artery Disease     4 month visit and med review   ??? Hypertension       HPI:    77 year old female returns for follow-up history of some minor coronary disease and hypertension hyperlipidemia.  From that standpoint she is been stable.  She had a problem with a markedly elevated sed rate of 115 markedly elevated CRP 30.  She seen by oncology and they could not really find exact cause she also has a normocytic anemia underwent EGD and colonoscopy with some mild polyps but no obvious source for any blood loss.  She is now seeing rheumatology I was evaluating her as well.  It looks like her ANA and rheumatoid factors are all been negative.  She does have a lot of aching in her neck and shoulders and pelvis areas.  Certainly the diagnosis of polymyalgia rheumatica needs to be entertained and observe she will follow-up with a rheumatologist for this.    Hypertension    Pertinent negatives include no chest pain and no shortness of breath.       Past Medical History, Past Surgical History, Family history, Social History, and Medications were all reviewed with the patient today and updated as necessary.     Outpatient Medications Marked as Taking for the 06/01/18 encounter (Office Visit) with Maree Krabbe, MD   Medication Sig Dispense Refill   ??? meloxicam (MOBIC) 7.5 mg tablet Take 7.5 mg by mouth two (2) times a day.     ??? folic acid 400 mcg tablet Take 400 mcg by mouth daily.     ??? pravastatin (PRAVACHOL) 80 mg tablet Take 1 Tab by mouth daily. 90 Tab 3   ??? lisinopril-hydroCHLOROthiazide (PRINZIDE, ZESTORETIC) 20-12.5 mg per tablet Take 1 Tab by mouth daily. 90 Tab 3   ??? famotidine (PEPCID) 20 mg tablet take 1 tablet by mouth once daily 90  Tab 3   ??? nitroglycerin (NITROSTAT) 0.4 mg SL tablet Place 1 sl under the tongue q 5 min prn cp, max 3 sl in a 15-min time period. Call 911 if no relief after the 3rd sl. 1 Bottle prn   ??? aspirin 81 mg chewable tablet Take 1 Tab by mouth daily. 30 Tab 11     Allergies   Allergen Reactions   ??? Ativan [Lorazepam] Other (comments)     Made her feel crazy and chest pressure   ??? Ativan [Lorazepam] Other (comments)     Chest fullness   ??? Other Medication Palpitations     Some type of decongestant   ??? Other Medication Other (comments)     Doesn't take decongestants, but can't recall why     Past Medical History:   Diagnosis Date   ??? Abdominal pain 07/22/2015   ??? Anemia    ??? Back pain    ??? Bursitis of shoulder    ??? CAD (coronary artery disease)    ??? Cerumen impaction    ??? Chest pain, atypical 07/22/2015   ??? Diabetes (HCC)     no meds, A1c 09/28/17 6.6; avg bs 105; denies ss of hypo   ??? Diabetes mellitus out of control (  HCC)    ??? Endocrine disease     thyroid   ??? Fatigue    ??? GERD (gastroesophageal reflux disease)     controlled with medicaitons   ??? Hip pain, right    ??? Hyperlipidemia, mild    ??? Hypertension     managed with medication   ??? Hypertension associated with diabetes (HCC) 07/22/2015   ??? 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 Left carotid bruit    ??? Lumbago 07/22/2015   ??? Morbid obesity (HCC) 07/22/2015    BMI 35.3   ??? OSA on CPAP 07/22/2015    uses CPAP   ??? Other ill-defined conditions(799.89)     cholesterol   ??? Other ill-defined conditions(799.89)     heart cath 96   ??? Shoulder pain, acute    ??? Shoulder pain, bilateral    ??? Sinusitis, acute maxillary    ??? Unstable angina (HCC) 07/06/2015   ??? Vitamin D deficiency    ??? Weakness      Past Surgical History:   Procedure Laterality Date   ??? COLONOSCOPY N/A 03/01/2018    COLONOSCOPY/BMI 39 performed by Mazanec, Worthy Rancher, MD at [facility] END[facility]COPY   ??? HX COLONOSCOPY     ??? HX HEART CATHETERIZATION  2016    without intervention   ??? HX HEENT       goiter/thyroid surgery   ??? HX THYROIDECTOMY       Family History   Problem Relation Age of Onset   ??? Stroke Sister    ??? Cancer Sister         cervical   ??? Stroke Brother    ??? Diabetes Other    ??? Hypertension Other       Social History     Tobacco Use   ??? Smoking status: Former Smoker     Last attempt to quit: 1971     Years since quitting: 48.7   ??? Smokeless tobacco: Never Used   ??? Tobacco comment: quit in 1971   Substance Use Topics   ??? Alcohol use: No       ROS:    Review of Systems   Cardiovascular: Negative for chest pain and dyspnea on exertion.   Respiratory: Negative for shortness of breath.    Musculoskeletal: Positive for arthritis, back pain and stiffness.           PHYSICAL EXAM:    Visit Vitals  BP 138/64   Pulse 80   Ht 4\' 11"  (1.499 m)   Wt 181 lb (82.1 kg) Comment: with shoes   BMI 36.56 kg/m??          Wt Readings from Last 3 Encounters:   06/01/18 181 lb (82.1 kg)   05/12/18 180 lb (81.6 kg)   05/05/18 181 lb 12.8 oz (82.5 kg)     BP Readings from Last 3 Encounters:   06/01/18 138/64   05/12/18 134/62   05/05/18 120/70         Physical Exam   Constitutional: She appears well-developed.       Medical problems and test results were reviewed with the patient today.     No results found for any visits on 06/01/18.  Lab Results   Component Value Date/Time    Sodium 141 05/12/2018 01:09 PM    Potassium 3.6 05/12/2018 01:09 PM    Chloride 106 05/12/2018 01:09 PM    CO2 26 05/12/2018 01:09 PM    Anion gap 9 05/12/2018  01:09 PM    Glucose 153 (H) 05/12/2018 01:09 PM    BUN 24 (H) 05/12/2018 01:09 PM    Creatinine 1.40 (H) 05/12/2018 01:09 PM    BUN/Creatinine ratio 16 09/28/2017 10:32 AM    GFR est AA 47 (L) 05/12/2018 01:09 PM    GFR est non-AA 39 (L) 05/12/2018 01:09 PM    Calcium 9.4 05/12/2018 01:09 PM     Lab Results   Component Value Date/Time    Cholesterol, total 148 09/28/2017 10:32 AM    HDL Cholesterol 53 09/28/2017 10:32 AM    LDL, calculated 83 09/28/2017 10:32 AM     VLDL, calculated 12 09/28/2017 10:32 AM    Triglyceride 61 09/28/2017 10:32 AM    CHOL/HDL Ratio 2.5 07/07/2015 05:15 AM         ASSESSMENT and PLAN    Diagnoses and all orders for this visit:    1. Essential hypertension she is having stable blood pressure    2. Coronary artery disease involving native coronary artery of native heart without angina pectoris she does not have any active chest pain at this time    3. Controlled type 2 diabetes mellitus without complication, without long-term current use of insulin (HCC)    4. Severe obesity (HCC) continues to continue to lose weight.    5. Elevated sed rate is a marker of a sed rate and CRP it would suggest some obvious inflammation.  There is no obvious signs of any type of infection at this time.  Certainly I would consider the possibility of polymyalgia rheumatica.  Her rheumatoid factor ANA both negative she does have normocytic anemia which could potentially go along with it.  Not sure if that is the proper diagnosis but I would certainly go back to rheumatology discussed that with her today    6. Normocytic anemia is normocytic anemia followed by hematology.    7. Dyslipidemia due to statin dose.    Other orders  -     pravastatin (PRAVACHOL) 80 mg tablet; Take 1 Tab by mouth daily.  -     lisinopril-hydroCHLOROthiazide (PRINZIDE, ZESTORETIC) 20-12.5 mg per tablet; Take 1 Tab by mouth daily.               Follow-up and Dispositions    ?? Return in about 6 months (around 11/30/2018).         Maree Krabbe, MD  06/01/2018  1:45 PM

## 2018-06-01 NOTE — Progress Notes (Signed)
Progress  Notes by Maree Krabbe, MD at 06/01/18 1300                Author: Maree Krabbe, MD  Service: --  Author Type: Physician       Filed: 06/01/18 1348  Encounter Date: 06/01/2018  Status: Signed          Editor: Maree Krabbe, MD (Physician)                    [address], [address]   [place], [place] [address]   PHONE: [phone]      Kareli Hossain   05-Jun-1941         SUBJECTIVE:    Sarah Chavez is a 77 y.o.  female seen for a follow up visit regarding the following:         Chief Complaint       Patient presents with        ?  Coronary Artery Disease             4 month visit and med review        ?  Hypertension           HPI:      77 year old female returns for follow-up history of some minor coronary disease and hypertension hyperlipidemia.   From that standpoint she is been stable.  She had a problem with a markedly elevated sed rate of 115 markedly elevated CRP 30.  She seen by oncology and they could not really find exact cause she also has a normocytic anemia underwent EGD and colonoscopy  with some mild polyps but no obvious source for any blood loss.  She is now seeing rheumatology I was evaluating her as well.  It looks like her ANA and rheumatoid factors are all been negative.  She does have a lot of aching in her neck and shoulders  and pelvis areas.  Certainly the diagnosis of polymyalgia rheumatica needs to be entertained and observe she will follow-up with a rheumatologist for this.      Hypertension     Pertinent negatives include no chest pain and no shortness of breath.          Past Medical History, Past Surgical History, Family history, Social History, and Medications were all reviewed with the patient today and updated as necessary.         Outpatient Medications Marked as Taking for the 06/01/18 encounter (Office Visit) with Maree Krabbe, MD          Medication  Sig  Dispense  Refill           ?  meloxicam (MOBIC) 7.5 mg tablet  Take 7.5 mg by mouth  two (2) times a day.         ?  folic acid 400 mcg tablet  Take 400 mcg by mouth daily.         ?  pravastatin (PRAVACHOL) 80 mg tablet  Take 1 Tab by mouth daily.  90 Tab  3           ?  lisinopril-hydroCHLOROthiazide (PRINZIDE, ZESTORETIC) 20-12.5 mg per tablet  Take 1 Tab by mouth daily.  90 Tab  3           ?  famotidine (PEPCID) 20 mg tablet  take 1 tablet by mouth once daily  90 Tab  3     ?  nitroglycerin (NITROSTAT) 0.4 mg SL  tablet  Place 1 sl under the tongue q 5 min prn cp, max 3 sl in a 15-min time period. Call 911 if no relief after the 3rd sl.  1 Bottle  prn           ?  aspirin 81 mg chewable tablet  Take 1 Tab by mouth daily.  30 Tab  11          Allergies        Allergen  Reactions         ?  Ativan [Lorazepam]  Other (comments)             Made her feel crazy and chest pressure         ?  Ativan [Lorazepam]  Other (comments)             Chest fullness         ?  Other Medication  Palpitations             Some type of decongestant         ?  Other Medication  Other (comments)             Doesn't take decongestants, but can't recall why          Past Medical History:        Diagnosis  Date         ?  Abdominal pain  07/22/2015     ?  Anemia       ?  Back pain       ?  Bursitis of shoulder       ?  CAD (coronary artery disease)       ?  Cerumen impaction       ?  Chest pain, atypical  07/22/2015     ?  Diabetes (HCC)            no meds, A1c 09/28/17 6.6; avg bs 105; denies ss of hypo         ?  Diabetes mellitus out of control [facility])       ?  Endocrine disease            thyroid         ?  Fatigue       ?  GERD (gastroesophageal reflux disease)            controlled with medicaitons         ?  Hip pain, right       ?  Hyperlipidemia, mild       ?  Hypertension            managed with medication         ?  Hypertension associated with diabetes (HCC)  07/22/2015     ?  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  Left carotid bruit       ?  Lumbago  07/22/2015     ?  Morbid obesity  (HCC)  07/22/2015          BMI 35.3         ?  OSA on CPAP  07/22/2015          uses CPAP         ?  Other ill-defined conditions(799.89)            cholesterol         ?  Other ill-defined conditions(799.89)  heart cath 96         ?  Shoulder pain, acute           ?  Shoulder pain, bilateral           ?  Sinusitis, acute maxillary       ?  Unstable angina (HCC)  07/06/2015     ?  Vitamin D deficiency           ?  Weakness            Past Surgical History:         Procedure  Laterality  Date          ?  COLONOSCOPY  N/A  03/01/2018          COLONOSCOPY/BMI 39 performed by Mazanec, Worthy Rancher, MD at [facility] ENDOSCOPY          ?  HX COLONOSCOPY         ?  HX HEART CATHETERIZATION    2016          without intervention          ?  HX HEENT              goiter/thyroid surgery          ?  HX THYROIDECTOMY              Family History         Problem  Relation  Age of Onset          ?  Stroke  Sister       ?  Cancer  Sister                cervical          ?  Stroke  Brother       ?  Diabetes  Other            ?  Hypertension  Other             Social History          Tobacco Use         ?  Smoking status:  Former Smoker              Last attempt to quit:  1971         Years since quitting:  48.7         ?  Smokeless tobacco:  Never Used        ?  Tobacco comment: quit in 1971       Substance Use Topics         ?  Alcohol use:  No           ROS:      Review of Systems    Cardiovascular: Negative for chest pain and dyspnea on exertion.    Respiratory: Negative for shortness of breath.     Musculoskeletal: Positive for arthritis, back pain  and stiffness.               PHYSICAL EXAM:      Visit Vitals      BP  138/64     Pulse  80     Ht  4\' 11"  (1.499 m)         Wt  181 lb (82.1 kg)  Comment: with shoes        BMI  36.56 kg/m??  Wt Readings from Last 3 Encounters:        06/01/18  181 lb (82.1 kg)     05/12/18  180 lb (81.6 kg)        05/05/18  181 lb 12.8 oz (82.5 kg)          BP Readings from Last 3  Encounters:        06/01/18  138/64     05/12/18  134/62        05/05/18  120/70              Physical Exam    Constitutional: She appears well-developed.          Medical problems and test results were reviewed with the patient today.       No results found for any visits on 06/01/18.     Lab Results         Component  Value  Date/Time            Sodium  141  05/12/2018 01:09 PM       Potassium  3.6  05/12/2018 01:09 PM       Chloride  106  05/12/2018 01:09 PM       CO2  26  05/12/2018 01:09 PM       Anion gap  9  05/12/2018 01:09 PM       Glucose  153 (H)  05/12/2018 01:09 PM       BUN  24 (H)  05/12/2018 01:09 PM       Creatinine  1.40 (H)  05/12/2018 01:09 PM       BUN/Creatinine ratio  16  09/28/2017 10:32 AM       GFR est AA  47 (L)  05/12/2018 01:09 PM       GFR est non-AA  39 (L)  05/12/2018 01:09 PM            Calcium  9.4  05/12/2018 01:09 PM          Lab Results         Component  Value  Date/Time            Cholesterol, total  148  09/28/2017 10:32 AM       HDL Cholesterol  53  09/28/2017 10:32 AM       LDL, calculated  83  09/28/2017 10:32 AM       VLDL, calculated  12  09/28/2017 10:32 AM       Triglyceride  61  09/28/2017 10:32 AM            CHOL/HDL Ratio  2.5  07/07/2015 05:15 AM              ASSESSMENT and PLAN      Diagnoses and all orders for this visit:      1. Essential hypertension she is having stable blood pressure      2. Coronary artery disease involving native coronary artery of native heart without angina pectoris she does not have any active chest pain at this time      3. Controlled type 2 diabetes mellitus without complication, without long-term current use of insulin (HCC)      4. Severe obesity (HCC) continues to continue to lose weight.      5. Elevated sed rate is a marker of a sed rate and CRP it would suggest some obvious inflammation.  There is no obvious signs of any type of infection at this  time.  Certainly I would consider  the possibility of polymyalgia rheumatica.  Her  rheumatoid factor ANA both negative she does have normocytic anemia which could potentially go along with it.  Not sure if that is the proper diagnosis but I would certainly go back to rheumatology discussed  that with her today      6. Normocytic anemia is normocytic anemia followed by hematology.      7. Dyslipidemia due to statin dose.      Other orders   -     pravastatin (PRAVACHOL) 80 mg tablet; Take 1 Tab by mouth daily.   -     lisinopril-hydroCHLOROthiazide (PRINZIDE, ZESTORETIC) 20-12.5 mg per tablet; Take 1 Tab by mouth daily.                        Follow-up and Dispositions      ??  Return in about 6 months (around 11/30/2018).                Maree Krabbe, MD   06/01/2018   1:45 PM

## 2018-06-02 ENCOUNTER — Ambulatory Visit: Attending: Rheumatology | Primary: Family Medicine

## 2018-06-02 ENCOUNTER — Ambulatory Visit: Admit: 2018-06-02 | Discharge: 2018-06-02 | Payer: MEDICARE | Attending: Rheumatology | Primary: Family Medicine

## 2018-06-02 DIAGNOSIS — M353 Polymyalgia rheumatica: Secondary | ICD-10-CM

## 2018-06-02 LAB — HEMOGLOBIN A1C WITH EAG
Estimated average glucose: 126 mg/dL
Hemoglobin A1c: 6 % — ABNORMAL HIGH (ref 4.8–5.6)

## 2018-06-02 LAB — METABOLIC PANEL, COMPREHENSIVE
A-G Ratio: 0.9 — ABNORMAL LOW (ref 1.2–2.2)
ALT (SGPT): 9 IU/L (ref 0–32)
AST (SGOT): 12 IU/L (ref 0–40)
Albumin: 3.8 g/dL (ref 3.5–4.8)
Alk. phosphatase: 60 IU/L (ref 39–117)
BUN/Creatinine ratio: 14 (ref 12–28)
BUN: 15 mg/dL (ref 8–27)
Bilirubin, total: 0.3 mg/dL (ref 0.0–1.2)
CO2: 22 mmol/L (ref 20–29)
Calcium: 9.2 mg/dL (ref 8.7–10.3)
Chloride: 101 mmol/L (ref 96–106)
Creatinine: 1.07 mg/dL — ABNORMAL HIGH (ref 0.57–1.00)
GFR est AA: 57 mL/min/{1.73_m2} — ABNORMAL LOW (ref >59)
GFR est non-AA: 49 mL/min/{1.73_m2} — ABNORMAL LOW (ref >59)
GLOBULIN, TOTAL: 4.4 g/dL (ref 1.5–4.5)
Glucose: 89 mg/dL (ref 65–99)
Potassium: 4.2 mmol/L (ref 3.5–5.2)
Protein, total: 8.2 g/dL (ref 6.0–8.5)
Sodium: 140 mmol/L (ref 134–144)

## 2018-06-02 LAB — LIPID PANEL
Cholesterol, Total: 151 mg/dL (ref 100–199)
Cholesterol, total: 151 mg/dL (ref 100–199)
HDL Cholesterol: 55 mg/dL (ref >39)
HDL: 55 mg/dL (ref >39)
LDL Calculated: 80 mg/dL (ref 0–99)
LDL, calculated: 80 mg/dL (ref 0–99)
Triglyceride: 80 mg/dL (ref 0–149)
Triglycerides: 80 mg/dL (ref 0–149)
VLDL Cholesterol Calculated: 16 mg/dL (ref 5–40)
VLDL, calculated: 16 mg/dL (ref 5–40)

## 2018-06-02 LAB — VITAMIN D, 25 HYDROXY: VITAMIN D, 25-HYDROXY: 25.3 ng/mL — ABNORMAL LOW (ref 30.0–100.0)

## 2018-06-02 LAB — COMPREHENSIVE METABOLIC PANEL
ALT: 9 IU/L (ref 0–32)
AST: 12 IU/L (ref 0–40)
Albumin/Globulin Ratio: 0.9 NA — ABNORMAL LOW (ref 1.2–2.2)
Albumin: 3.8 g/dL (ref 3.5–4.8)
Alkaline Phosphatase: 60 IU/L (ref 39–117)
BUN: 15 mg/dL (ref 8–27)
Bun/Cre Ratio: 14 NA (ref 12–28)
CO2: 22 mmol/L (ref 20–29)
Calcium: 9.2 mg/dL (ref 8.7–10.3)
Chloride: 101 mmol/L (ref 96–106)
Creatinine: 1.07 mg/dL — ABNORMAL HIGH (ref 0.57–1.00)
EGFR IF NonAfrican American: 49 mL/min/{1.73_m2} — ABNORMAL LOW (ref >59)
GFR African American: 57 mL/min/{1.73_m2} — ABNORMAL LOW (ref >59)
Globulin, Total: 4.4 g/dL (ref 1.5–4.5)
Glucose: 89 mg/dL (ref 65–99)
Potassium: 4.2 mmol/L (ref 3.5–5.2)
Sodium: 140 mmol/L (ref 134–144)
Total Bilirubin: 0.3 mg/dL (ref 0.0–1.2)
Total Protein: 8.2 g/dL (ref 6.0–8.5)

## 2018-06-02 LAB — HEMOGLOBIN A1C W/EAG
Hemoglobin A1C: 6 % — ABNORMAL HIGH (ref 4.8–5.6)
eAG: 126 mg/dL

## 2018-06-02 LAB — VITAMIN D 25 HYDROXY: Vit D, 25-Hydroxy: 25.3 ng/mL — ABNORMAL LOW (ref 30.0–100.0)

## 2018-06-02 MED ORDER — PREDNISONE 20 MG TAB
20 mg | ORAL_TABLET | ORAL | 1 refills | Status: DC
Start: 2018-06-02 — End: 2018-09-05

## 2018-06-02 NOTE — Progress Notes (Signed)
Progress Notes by Ethlyn Daniels, MD at 06/02/18 1020                Author: Ethlyn Daniels, MD  Service: --  Author Type: Physician       Filed: 06/02/18 1136  Encounter Date: 06/02/2018  Status: Signed          Editor: Ethlyn Daniels, MD (Physician)               [facility]   Jeffie Pollock, M.D.   [address] [place]. [place], [place], [place], [address]   Office : [phone], Fax: [phone]         RHEUMATOLOGY OFFICE VISIT NOTE   Date of Visit:  06/02/2018 10:33  AM      Patient Information:   Name:  Sarah Chavez   DOB:  27-Jul-1941   Age:  77 y.o.    Gender:  female         Sarah Chavez is here today for  follow-up of lab results / first return.         Last visit: 05/05/2018         History of Present Illness: On talking to the patient today she states that she has been taking meloxicam which does seem to help ease some of her joint and muscle pain.  She still has ongoing  joint and muscle pain as mentioned below.      Since the last visit, patient is feeling "fair".         Pain: 8/10   Location:  Bilateral shoulder pain worse in the right shoulder than the left shoulder. Some right elbow pain with no swelling, warmth and redness. Some PIP joint pain worse than the MCP joint pain involving both hands. No swelling in the MCP and PIP joints  though. Some pain with neck ROM with occasional headaches. Some mid back pain. Some lower back pain. Bilateral hip and thigh pain and soreness. Some pain in bilateral groin areas. Some right knee pain worse than the left knee pain. Bilateral knee swelling  worse in the right knee than the left knee. Occasional buckling of the knees. No pain but has had occasional swelling of the feet on the dorsum with no associated warmth and redness.    Quality:  Deep achy pain.   Modifying Factors:  The pain keeps her awake from sleep at night.    Associated Symptoms:  AM Stiffness: 45 min; Some arm soreness with pain  with raising her shoulders. Some right arm weakness worse than the left arm weakness. No tingling, numbness or pain down the legs. Some right leg weakness worse than the left leg  weakness.    Fatigue: 6/10   MDHAQ: 1.3         Last TB screen:10+ years   TB result:negative   Current dose of steroids:none   How long on current dose of steroids:na   How long on continuous steroid therapy:na      Past DMARDs, if applicable (methotrexate, plaquenil/hydroxychloroquine, sulfasalazine, Arava/leflunomide):none      Past biologics, if applicable  (enbrel, humira, simponi, cimzia, xeljanz, orencia, remicade, simponi aria, actemra, rituximab, [place], stelara, cosentyx):none      Past NSAIDs, if applicable (motrin, aleve, naproxen, advil, ibuprofen, celebrex, voltaren/diclofenac, etc.) ibuprofen.         Last BMD: 2013   Past osteoporosis drugs, if applicable (fosamax, actonel, boniva, reclast, prolia, forteo):none      BMI:36.44  Current exercise regimen, if UYQ:IHKV   Current vitamin D dose:none   Current calcium dose:none   Fractures since last visit, if any: none         The patient otherwise has no significant interval changes in health or medical history to report.       History Reviewed:      Past Medical History     Past Medical History:        Diagnosis  Date         ?  Abdominal pain  07/22/2015     ?  Anemia       ?  Back pain       ?  Bursitis of shoulder       ?  CAD (coronary artery disease)       ?  Cerumen impaction       ?  Chest pain, atypical  07/22/2015     ?  Diabetes ([place])            no meds, A1c 09/28/17 6.6; avg bs 105; denies ss of hypo         ?  Diabetes mellitus out of control [facility])       ?  Endocrine disease            thyroid         ?  Fatigue       ?  GERD (gastroesophageal reflux disease)            controlled with medicaitons         ?  Hip pain, right       ?  Hyperlipidemia, mild       ?  Hypertension            managed with medication         ?  Hypertension associated with diabetes ([place])   07/22/2015     ?  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  Left carotid bruit       ?  Lumbago  07/22/2015     ?  Morbid obesity ([place])  07/22/2015          BMI 35.3         ?  OSA on CPAP  07/22/2015          uses CPAP         ?  Other ill-defined conditions(799.89)            cholesterol         ?  Other ill-defined conditions(799.89)            heart cath 96         ?  Shoulder pain, acute       ?  Shoulder pain, bilateral       ?  Sinusitis, acute maxillary       ?  Unstable angina ([place])  07/06/2015     ?  Vitamin D deficiency           ?  Weakness             Past Surgical History     Past Surgical History:         Procedure  Laterality  Date          ?  COLONOSCOPY  N/A  03/01/2018          COLONOSCOPY/BMI 39 performed by Mazanec, Dorene Ar, MD at  [facility]          ?  HX COLONOSCOPY         ?  HX HEART CATHETERIZATION    2016          without intervention          ?  HX HEENT              goiter/thyroid surgery          ?  HX THYROIDECTOMY               Family History     Family History         Problem  Relation  Age of Onset          ?  Stroke  Sister       ?  Cancer  Sister                cervical          ?  Stroke  Brother       ?  Diabetes  Other            ?  Hypertension  Other             Social History     Social History          Socioeconomic History         ?  Marital status:  SINGLE              Spouse name:  Not on file         ?  Number of children:  Not on file     ?  Years of education:  Not on file     ?  Highest education level:  Not on file       Tobacco Use         ?  Smoking status:  Former Smoker              Last attempt to quit:  1971         Years since quitting:  48.7         ?  Smokeless tobacco:  Never Used        ?  Tobacco comment: quit in 1971       Substance and Sexual Activity         ?  Alcohol use:  No     ?  Drug use:  No       Social History Narrative          ** Merged History Encounter **                                  Allergy:     Allergies         Allergen  Reactions         ?  Ativan [Lorazepam]  Other (comments)             Made her feel crazy and chest pressure         ?  Ativan [Lorazepam]  Other (comments)             Chest fullness         ?  Other Medication  Palpitations             Some type of decongestant         ?  Other Medication  Other (comments)             Doesn't take decongestants, but can't recall why              Current Medications:     Outpatient Encounter Medications as of 06/02/2018          Medication  Sig  Dispense  Refill           ?  predniSONE (DELTASONE) 20 mg tablet  Take 1 pill once a day after food.  30 Tab  1     ?  meloxicam (MOBIC) 7.5 mg tablet  Take 7.5 mg by mouth two (2) times a day.         ?  folic acid 606 mcg tablet  Take 400 mcg by mouth daily.         ?  pravastatin (PRAVACHOL) 80 mg tablet  Take 1 Tab by mouth daily.  90 Tab  3     ?  lisinopril-hydroCHLOROthiazide (PRINZIDE, ZESTORETIC) 20-12.5 mg per tablet  Take 1 Tab by mouth daily.  90 Tab  3     ?  famotidine (PEPCID) 20 mg tablet  take 1 tablet by mouth once daily  90 Tab  3     ?  nitroglycerin (NITROSTAT) 0.4 mg SL tablet  Place 1 sl under the tongue q 5 min prn cp, max 3 sl in a 15-min time period. Call 911 if no relief after the 3rd sl.  1 Bottle  prn           ?  aspirin 81 mg chewable tablet  Take 1 Tab by mouth daily.  30 Tab  11          No facility-administered encounter medications on file as of 06/02/2018.                  REVIEW OF SYSTEMS: The following systems were reviewed with patient today and were negative except for the following (depicted with an "X"):                        "X"  General    "X"  Head and Neck    "X"  Heart and Breathing    "X"  Gastrointestinal     x  Fever/chills      Hair loss    x  Shortness of breath      Upset stomach       Falls      Dry mouth    x  Coughing    x  Diarrhea / constipation     x  Wt loss      Mouth sores    x  Wheezing      Heartburn       Wt gain    x  Ringing ears      Chest pain      Dark or  bloody stools       Night sweats    x  Diff. swallowing      None of above      Nausea or vomiting       None of above      None of above            None of above                                                      "  X"  Skin    "X"  Neurology    "X"  Urinary/Gyn    "X"  Other       Easy bruising    x  Numbness/ tingling      Female problems      Depression       Rashes    x  Weakness    x  Problems with urination      Feeling anxious       Sun sensitivity    x  Headaches      None of above      Problems sleeping     X  None of above      None of above          X  None of above               Physical Exam:   Blood pressure 150/64, height _0  (1.499 m), weight 180 lb 6.4 oz (81.8 kg).   General:  Patient alert, cooperative and in no apparent distress.   HEENT: Pupils equally reactive to light and accommodation, noted scleral injection in both the eyes.  Neck supple, no lymphadenopathy, no thyromegaly.  No temporal tenderness with no jaw pain with opening and closing of her mouth noted.   Heart: Regular rate and rhythm, normal S1 and S2, audible murmur with no rubs or gallops.   Lungs: Clear to auscultation bilaterally.   Abdomen: Soft, nontender, no hepatosplenomegaly.   Skin:  No rashes. No nail abnormalities.   Neurologic:  Oriented, normal speech and affect.  Normal gait.     Extremities:  No edema in bilateral lower extremities with no cyanosis or clubbing.      Muskoskeletal Exam:       I examined the shoulders, elbows, wrists, MCPs, PIPs, DIPs and knees bilaterally for strength, range of motion, deformity, tenderness, swelling, and synovitis.        The findings are: Does have tenderness on palpation of the right and left 2nd-5th PIP joints with trace synovitis but no overlying warmth or redness.  Her ability to flex the  fingers is limited with intact extension with a weak grip in both hands.  Does have tenderness on palpation of the [facility] joint of both the thumbs with no synovitis, warmth or redness.  Does  have tenderness on palpation of the shoulders both anteriorly as  well as superiorly worse in the right than the left with limited abduction as well as internal rotation.   Does have tenderness on palpation of the C-spine as well as the paraspinal muscles of the neck with tenderness on palpation of the T-spine as well as the L-spine with bilateral SI joint tenderness.  Does have tenderness on palpation of the knees in the  mid joint line with associated synovitis and joint crepitus on flexion as well as extension of the knees.  Does have tenderness on palpation of the ankles laterally with trace synovitis but no overlying warmth or redness.  Does have metatarsalgia in both  feet with some tenderness on palpation of the right and left 1st-5th MTP joints with no overlying warmth or redness though.         Patient otherwise has a normal joint exam without other evidence of joint tenderness, synovitis, warmth, erythema, decreased ROM, weakness or deformities.             Radiology Reports Reviewed (if available):  Last 3 months   Xr Chest Pa Lat  Result Date: 04/02/2018   History: Chest Pain Two views chest COMPARISON: 07/31/2017 Findings: The lungs are well expanded and clear. The cardiac silhouette is persistently enlarged, and the mediastinal contour, and osseous structures are stable.       Impression: Stable two-view chest.       Ct Urogram Wo Cont      Result Date: 04/02/2018   CT ABDOMEN AND PELVIS WITHOUT CONTRAST HISTORY: flank pain, 77 years Female Left flank pain Urinary frequency COMPARISON: CT abdomen March 20, 2008 TECHNIQUE: No oral or intravenous contrast administered. Axial helical CT images were obtained from above  the diaphragm through the pelvis.  Radiation dose reduction techniques were used for this study:  Our CT scanners use one or all of the following: Automated exposure control, adjustment of the mA and/or kVp according to patient's size, iterative reconstruction.  FINDINGS: ABDOMEN: Minimal  dependent subsegmental atelectasis bilateral lung bases.  New small pericardial effusion partially visualized.  Normal-appearing liver, gallbladder, spleen, pancreas. Mild fullness of the bilateral adrenal glands unchanged,  nonspecific.  Normal appearance of the bilateral kidneys.  There is no evidence of hydronephrosis or nephrolithiasis.  Mild calcific atherosclerosis of a normal caliber abdominal aorta.  No evidence of significant lymphadenopathy. Normal-appearing small  bowel.  No evidence of intraperitoneal free air or free fluid. PELVIS: Normal-appearing urinary bladder.  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  Normal-appearing bilateral adnexa. Normal-appearing colon and  appendix.  No evidence of pelvic free fluid.  No evidence of significant inguinal or pelvic sidewall lymphadenopathy.  Visualized osseous structures unremarkable.       IMPRESSION: 1.  No acute intra-abdominal pathology identified. 2.  New small pericardial effusion partially visualized. 3.  Other chronic findings as above.          Lab Reports Reviewed (if available): Last 3 months        Lab Only on 06/01/2018           Component  Date  Value  Ref Range  Status            ?  VITAMIN D, 25-HYDROXY  06/01/2018  25.3*  30.0 - 100.0 ng/mL  Final          Comment: Vitamin D deficiency has been defined by the [place] practice guideline as a   level of serum 25-OH vitamin D less than 20 ng/mL (1,2).   The Endocrine Society went on to further define vitamin D   insufficiency as a level between 21 and 29 ng/mL (2).   1. IOM (Institute of Medicine). 2010. Dietary reference      intakes for calcium and D. [place]: The      [employer].   2. Holick MF, Binkley NC, Bischoff-Ferrari HA, et al.      Evaluation, treatment, and prevention of vitamin D      deficiency: an Endocrine Society clinical practice      guideline. JCEM. 2011 Jul; 96(7):1911-30.                ?  Cholesterol, total  06/01/2018  151   100 - 199 mg/dL  Final     ?  Triglyceride  06/01/2018  80   0 - 149 mg/dL  Final     ?  HDL Cholesterol  06/01/2018  55   >39 mg/dL  Final     ?  VLDL, calculated  06/01/2018  16  5 - 40 mg/dL  Final     ?  LDL, calculated  06/01/2018  80   0 - 99 mg/dL  Final     ?  Hemoglobin A1c  06/01/2018  6.0*  4.8 - 5.6 %  Final          Comment:          Prediabetes: 5.7 - 6.4            Diabetes: >6.4            Glycemic control for adults with diabetes: <7.0               ?  Estimated average glucose  06/01/2018  126   mg/dL  Final     ?  Glucose  06/01/2018  89   65 - 99 mg/dL  Final     ?  BUN  06/01/2018  15   8 - 27 mg/dL  Final     ?  Creatinine  06/01/2018  1.07*  0.57 - 1.00 mg/dL  Final     ?  GFR est non-AA  06/01/2018  49*  >59 mL/min/1.73  Final     ?  GFR est AA  06/01/2018  57*  >59 mL/min/1.73  Final     ?  BUN/Creatinine ratio  06/01/2018  14   12 - 28  Final     ?  Sodium  06/01/2018  140   134 - 144 mmol/L  Final     ?  Potassium  06/01/2018  4.2   3.5 - 5.2 mmol/L  Final     ?  Chloride  06/01/2018  101   96 - 106 mmol/L  Final     ?  CO2  06/01/2018  22   20 - 29 mmol/L  Final     ?  Calcium  06/01/2018  9.2   8.7 - 10.3 mg/dL  Final     ?  Protein, total  06/01/2018  8.2   6.0 - 8.5 g/dL  Final     ?  Albumin  06/01/2018  3.8   3.5 - 4.8 g/dL  Final     ?  GLOBULIN, TOTAL  06/01/2018  4.4   1.5 - 4.5 g/dL  Final     ?  A-G Ratio  06/01/2018  0.9*  1.2 - 2.2  Final     ?  Bilirubin, total  06/01/2018  0.3   0.0 - 1.2 mg/dL  Final     ?  Alk. phosphatase  06/01/2018  60   39 - 117 IU/L  Final     ?  AST (SGOT)  06/01/2018  12   0 - 40 IU/L  Final     ?  ALT (SGPT)  06/01/2018  9   0 - 32 IU/L  Final       Hospital Outpatient Visit on 05/12/2018           Component  Date  Value  Ref Range  Status            ?  WBC  05/12/2018  8.3   4.3 - 11.1 K/uL  Final     ?  RBC  05/12/2018  3.40*  4.05 - 5.25 M/uL  Final     ?  HGB  05/12/2018  8.9*  11.7 - 15.4  g/dL  Final     ?  HCT  05/12/2018  28.4*  35.8 - 46.3 %  Final     ?  MCV  05/12/2018  83.5   79.6 - 97.8 FL  Final     ?  MCH  05/12/2018  26.2   26.1 - 32.9 PG  Final     ?  MCHC  05/12/2018  31.3*  31.4 - 35.0 g/dL  Final     ?  RDW  05/12/2018  16.5*  11.9 - 14.6 %  Final     ?  PLATELET  05/12/2018  208   150 - 450 K/uL  Final     ?  MPV  05/12/2018  9.5   9.4 - 12.3 FL  Final     ?  ABSOLUTE NRBC  05/12/2018  0.00   0.0 - 0.2 K/uL  Final          **Note: Absolute NRBC parameter is now reported with Hemogram**            ?  DF  05/12/2018  AUTOMATED      Final     ?  NEUTROPHILS  05/12/2018  72   43 - 78 %  Final     ?  LYMPHOCYTES  05/12/2018  19   13 - 44 %  Final     ?  MONOCYTES  05/12/2018  7   4.0 - 12.0 %  Final     ?  EOSINOPHILS  05/12/2018  1   0.5 - 7.8 %  Final     ?  BASOPHILS  05/12/2018  0   0.0 - 2.0 %  Final     ?  IMMATURE GRANULOCYTES  05/12/2018  1   0.0 - 5.0 %  Final     ?  ABS. NEUTROPHILS  05/12/2018  6.0   1.7 - 8.2 K/UL  Final     ?  ABS. LYMPHOCYTES  05/12/2018  1.5   0.5 - 4.6 K/UL  Final     ?  ABS. MONOCYTES  05/12/2018  0.6   0.1 - 1.3 K/UL  Final     ?  ABS. EOSINOPHILS  05/12/2018  0.1   0.0 - 0.8 K/UL  Final     ?  ABS. BASOPHILS  05/12/2018  0.0   0.0 - 0.2 K/UL  Final     ?  ABS. IMM. GRANS.  05/12/2018  0.0   0.0 - 0.5 K/UL  Final     ?  Sodium  05/12/2018  141   136 - 145 mmol/L  Final     ?  Potassium  05/12/2018  3.6   3.5 - 5.1 mmol/L  Final     ?  Chloride  05/12/2018  106   98 - 107 mmol/L  Final     ?  CO2  05/12/2018  26   21 - 32 mmol/L  Final     ?  Anion gap  05/12/2018  9   7 - 16 mmol/L  Final     ?  Glucose  05/12/2018  153*  65 - 100 mg/dL  Final     ?  BUN  05/12/2018  24*  8 - 23 MG/DL  Final     ?  Creatinine  05/12/2018  1.40*  0.6 - 1.0 MG/DL  Final     ?  GFR est AA  05/12/2018  47*  >60 ml/min/1.34m  Final     ?  GFR est non-AA  05/12/2018  39*  >60 ml/min/1.729m Final  Comment: (NOTE)   Estimated GFR is calculated using the Modification of  Diet in Renal    Disease (MDRD) Study equation, reported for both African Americans    (GFRAA) and non-African Americans (GFRNA), and normalized to 1.24m    body surface area. The physician must decide which value applies to    the patient. The MDRD study equation should only be used in    individuals age 6229or older. It has not been validated for the    following: pregnant women, patients with serious comorbid conditions,    or on certain medications, or persons with extremes of body size,    muscle mass, or nutritional status.               ?  Calcium  05/12/2018  9.4   8.3 - 10.4 MG/DL  Final     ?  Bilirubin, total  05/12/2018  0.4   0.2 - 1.1 MG/DL  Final     ?  ALT (SGPT)  05/12/2018  15   12 - 65 U/L  Final     ?  AST (SGOT)  05/12/2018  8*  15 - 37 U/L  Final     ?  Alk. phosphatase  05/12/2018  61   50 - 136 U/L  Final     ?  Protein, total  05/12/2018  8.9*  6.3 - 8.2 g/dL  Final     ?  Albumin  05/12/2018  2.9*  3.2 - 4.6 g/dL  Final     ?  Globulin  05/12/2018  6.0*  2.3 - 3.5 g/dL  Final     ?  A-G Ratio  05/12/2018  0.5*  1.2 - 3.5    Final     ?  Ferritin  05/12/2018  557*  8 - 388 NG/ML  Final     ?  Reticulocyte count  05/12/2018  1.6   0.3 - 2.0 %  Final     ?  Absolute Retic Cnt.  05/12/2018  0.0544   0.026 - 0.095 M/ul  Final     ?  Immature Retic Fraction  05/12/2018  14.4   3.0 - 15.9 %  Final     ?  Retic Hgb Conc.  05/12/2018  30   29 - 35 pg  Final     ?  Iron  05/12/2018  33*  35 - 150 ug/dL  Final          Comment: Known Interfering Substances section:   "Iron values may be falsely elevated in   serum samples from patients with   anticoagulants (e.g., hemodialysis patients)."   Limitations of Procedure section:   "Turbidity resulting from precipitation of   fibrinogen in the serum of patients treated   with anticoagulants (e.g. hemodialysis   patients) may cause spuriously elevated   iron results."               ?  TIBC  05/12/2018  214*  250 - 450 ug/dL  Final     ?  Transferrin  Saturation  05/12/2018  15   %  Final     ?  Sed rate, automated  05/12/2018  115*  0 - 30 mm/hr  Final       Office Visit on 05/05/2018           Component  Date  Value  Ref Range  Status            ?  C-Reactive Protein, Qt  05/05/2018  39*  0 - 10 mg/L  Final     ?  CCP Antibodies IgG/IgA  05/05/2018  7   0 - 19 units  Final          Comment:                           Negative               <20                             Weak positive      20 - 39                             Moderate positive  40 - 59                             Strong positive        >59               ?  Rheumatoid factor  05/05/2018  <10.0   0.0 - 13.9 IU/mL  Final     ?  Antinuclear Antibodies Direct  05/05/2018  Negative   Negative  Final       Admission on 04/02/2018, Discharged on 04/02/2018           Component  Date  Value  Ref Range  Status            ?  WBC  04/02/2018  11.0   4.3 - 11.1 K/uL  Final     ?  RBC  04/02/2018  3.33*  4.05 - 5.2 M/uL  Final     ?  HGB  04/02/2018  8.8*  11.7 - 15.4 g/dL  Final     ?  HCT  04/02/2018  28.0*  35.8 - 46.3 %  Final     ?  MCV  04/02/2018  84.1   79.6 - 97.8 FL  Final     ?  MCH  04/02/2018  26.4   26.1 - 32.9 PG  Final     ?  MCHC  04/02/2018  31.4   31.4 - 35.0 g/dL  Final     ?  RDW  04/02/2018  15.5*  11.9 - 14.6 %  Final     ?  PLATELET  04/02/2018  240   150 - 450 K/uL  Final     ?  MPV  04/02/2018  10.2   9.4 - 12.3 FL  Final     ?  ABSOLUTE NRBC  04/02/2018  0.00   0.0 - 0.2 K/uL  Final          **Note: Absolute NRBC parameter is now reported with Hemogram**            ?  Sodium  04/02/2018  135*  136 - 145 mmol/L  Final     ?  Potassium  04/02/2018  3.7   3.5 - 5.1 mmol/L  Final     ?  Chloride  04/02/2018  100   98 - 107 mmol/L  Final     ?  CO2  04/02/2018  27   21 - 32 mmol/L  Final     ?  Anion gap  04/02/2018  8   7 - 16 mmol/L  Final     ?  Glucose  04/02/2018  102*  65 - 100 mg/dL  Final          Comment: 47 - 60 mg/dl Consistent with, but not fully diagnostic of hypoglycemia.    101 - 125 mg/dl Impaired fasting glucose/consistent with pre-diabetes mellitus   > 126 mg/dl Fasting glucose consistent with overt diabetes mellitus               ?  BUN  04/02/2018  9   8 - 23 MG/DL  Final     ?  Creatinine  04/02/2018  1.04*  0.6 - 1.0 MG/DL  Final     ?  GFR est AA  04/02/2018  >60   >60 ml/min/1.39m  Final     ?  GFR est non-AA  04/02/2018  54*  >60 ml/min/1.738m Final          Comment: (NOTE)   Estimated GFR is calculated using the Modification of Diet in Renal    Disease (MDRD) Study equation, reported for both African Americans    (GFRAA) and non-African Americans (GFRNA), and normalized to 1.7341m  body surface area. The physician must decide which value applies to    the patient. The MDRD study equation should only be used in    individuals age 109 64 older. It has not been validated for the    following: pregnant women, patients with serious comorbid conditions,    or on certain medications, or persons with extremes of body size,    muscle mass, or nutritional status.               ?  Calcium  04/02/2018  8.8   8.3 - 10.4 MG/DL  Final     ?  Bilirubin, total  04/02/2018  0.5   0.2 - 1.1 MG/DL  Final     ?  ALT (SGPT)  04/02/2018  16   12 - 65 U/L  Final     ?  AST (SGOT)  04/02/2018  18   15 - 37 U/L  Final     ?  Alk. phosphatase  04/02/2018  65   50 - 136 U/L  Final     ?  Protein, total  04/02/2018  9.1*  6.3 - 8.2 g/dL  Final     ?  Albumin  04/02/2018  2.9*  3.2 - 4.6 g/dL  Final     ?  Globulin  04/02/2018  6.2*  2.3 - 3.5 g/dL  Final     ?  A-G Ratio  04/02/2018  0.5*  1.2 - 3.5    Final     ?  Color  04/02/2018  YELLOW      Final     ?  Appearance  04/02/2018  CLEAR      Final     ?  Specific gravity  04/02/2018  1.011   1.001 - 1.023    Final     ?  pH (UA)  04/02/2018  7.0   5.0 - 9.0    Final     ?  Protein  04/02/2018  NEGATIVE    NEG mg/dL  Final     ?  Glucose  04/02/2018  NEGATIVE    mg/dL  Final     ?  Ketone  04/02/2018  NEGATIVE    NEG mg/dL  Final     ?  Bilirubin   04/02/2018  NEGATIVE    NEG    Final     ?  Blood  04/02/2018  NEGATIVE    NEG    Final     ?  Urobilinogen  04/02/2018  1.0   0.2 - 1.0 EU/dL  Final     ?  Nitrites  04/02/2018  NEGATIVE    NEG    Final     ?  Leukocyte Esterase  04/02/2018  NEGATIVE    NEG    Final     ?  Lipase  04/02/2018  86   73 - 393 U/L  Final     ?  Ventricular Rate  04/02/2018  86   BPM  Final     ?  Atrial Rate  04/02/2018  86   BPM  Final     ?  P-R Interval  04/02/2018  192   ms  Final     ?  QRS Duration  04/02/2018  86   ms  Final     ?  Q-T Interval  04/02/2018  382   ms  Final     ?  QTC Calculation ([facility])  04/02/2018  457   ms  Final     ?  Calculated P Axis  04/02/2018  15   degrees  Final     ?  Calculated R Axis  04/02/2018  -3   degrees  Final     ?  Calculated T Axis  04/02/2018  40   degrees  Final     ?  Diagnosis  04/02/2018       Final                        Value:Normal sinus rhythm   Normal ECG   When compared with ECG of 31-Jul-2017 17:09,   PR interval has decreased   Nonspecific T wave abnormality no longer evident in Lateral leads   Confirmed by BITTRICK  MD ([facility]), Orland Penman ([id number]) on 04/03/2018 8:31:23 AM          Office Visit on 03/31/2018           Component  Date  Value  Ref Range  Status            ?  Color (UA POC)  03/31/2018  Yellow     Final     ?  Clarity (UA POC)  03/31/2018  Slightly Cloudy     Final     ?  Glucose (UA POC)  03/31/2018  Negative   Negative  Final     ?  Bilirubin (UA POC)  03/31/2018  Negative   Negative  Final     ?  Ketones (UA POC)  03/31/2018  Negative   Negative  Final     ?  Specific gravity (UA POC)  03/31/2018  1.015   1.001 - 1.035  Final     ?  Blood (UA POC)  03/31/2018  Negative   Negative  Final     ?  pH (UA POC)  03/31/2018  7.0   4.6 - 8.0  Final     ?  Protein (UA POC)  03/31/2018  Trace   Negative  Final     ?  Urobilinogen (UA POC)  03/31/2018  normal   0.2 - 1  Final     ?  Nitrites (UA POC)  03/31/2018  Negative   Negative  Final     ?  Leukocyte esterase (UA POC)   03/31/2018  1+   Negative  Final          SMALL            ?  Urine Culture, Routine  03/31/2018       Final                        Value:Mixed urogenital flora   Less than 10,000 colonies/mL                 The results above were reviewed and discussed with patient.            Assessment/Plan:    Sarah Chavez is a 77 y.o.  female who presents with:       1. PMR (polymyalgia rheumatica) ([place]): Was instructed to stop meloxicam and was started on prednisone 20 mg 1 pill to be taken once a day after breakfast.  I did explain to  the patient that she would need to avoid any over-the-counter NSAID's while on prednisone as well.  Patient and daughter did express understanding.   -     predniSONE (DELTASONE) 20 mg tablet; Take 1 pill once a day after food.      2. Elevated erythrocyte sedimentation rate: I do expect to repeat the ESR as well as CRP on her follow-up visit with me to see if the prednisone does seem to have gotten her inflammatory markers  down.         Disease activity plan:  As stated above.      Steroid management plan:  As stated above, if applicable.      Pain management plan:  As stated above, if applicable.      Weight management plan:  Weight loss through diet and exercise is always encouraged      Disease prognosis: Good            I appreciate the opportunity to continue to participate in the care of this patient.         Follow-up and Dispositions      ??  Return in about 6 weeks (around 07/14/2018).             Electronically signed by:   Jeffie Pollock, MD         This note was dictated using dragon voice recognition software.  It has been proofread, but there may still exist voice recognition errors that the author did not detect.

## 2018-06-02 NOTE — Progress Notes (Signed)
[facility]  Jeffie Pollock, M.D.  [address] [place], [place], [place], [address]  Office : [phone], Fax: [phone]      RHEUMATOLOGY OFFICE VISIT NOTE  Date of Visit:  06/02/2018 10:33 AM    Patient Information:  Name:  Sarah Chavez  DOB:  20-Jun-1941  Age:  77 y.o.   Gender:  female      Ms. Upperman is here today for follow-up of lab results / first return.      Last visit: 05/05/2018      History of Present Illness: On talking to the patient today she states that she has been taking meloxicam which does seem to help ease some of her joint and muscle pain.  She still has ongoing joint and muscle pain as mentioned below.    Since the last visit, patient is feeling "fair".      Pain: 8/10  Location:  Bilateral shoulder pain worse in the right shoulder than the left shoulder. Some right elbow pain with no swelling, warmth and redness. Some PIP joint pain worse than the MCP joint pain involving both hands. No swelling in the MCP and PIP joints though. Some pain with neck ROM with occasional headaches. Some mid back pain. Some lower back pain. Bilateral hip and thigh pain and soreness. Some pain in bilateral groin areas. Some right knee pain worse than the left knee pain. Bilateral knee swelling worse in the right knee than the left knee. Occasional buckling of the knees. No pain but has had occasional swelling of the feet on the dorsum with no associated warmth and redness.   Quality:  Deep achy pain.  Modifying Factors:  The pain keeps her awake from sleep at night.   Associated Symptoms:  AM Stiffness: 45 min; Some arm soreness with pain with raising her shoulders. Some right arm weakness worse than the left arm weakness. No tingling, numbness or pain down the legs. Some right leg weakness worse than the left leg weakness.   Fatigue: 6/10  MDHAQ: 1.3      Last TB screen:10+ years  TB result:negative  Current dose of steroids:none   How long on current dose of steroids:na  How long on continuous steroid therapy:na    Past DMARDs, if applicable (methotrexate, plaquenil/hydroxychloroquine, sulfasalazine, Arava/leflunomide):none    Past biologics, if applicable (enbrel, humira, simponi, cimzia, xeljanz, orencia, remicade, simponi aria, actemra, rituximab, [place], stelara, cosentyx):none    Past NSAIDs, if applicable (motrin, aleve, naproxen, advil, ibuprofen, celebrex, voltaren/diclofenac, etc.) ibuprofen.      Last BMD: 2013  Past osteoporosis drugs, if applicable (fosamax, actonel, boniva, reclast, prolia, forteo):none    BMI:36.44  Current exercise regimen, if [facility]:URKY  Current vitamin D dose:none  Current calcium dose:none  Fractures since last visit, if any: none      The patient otherwise has no significant interval changes in health or medical history to report.     History Reviewed:    Past Medical History  Past Medical History:   Diagnosis Date   ??? Abdominal pain 07/22/2015   ??? Anemia    ??? Back pain    ??? Bursitis of shoulder    ??? CAD (coronary artery disease)    ??? Cerumen impaction    ??? Chest pain, atypical 07/22/2015   ??? Diabetes ([place])     no meds, A1c 09/28/17 6.6; avg bs 105; denies ss of hypo   ??? Diabetes mellitus out of control ([place])    ???  Endocrine disease     thyroid   ??? Fatigue    ??? GERD (gastroesophageal reflux disease)     controlled with medicaitons   ??? Hip pain, right    ??? Hyperlipidemia, mild    ??? Hypertension     managed with medication   ??? Hypertension associated with diabetes ([place]) 07/22/2015   ??? 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 Left carotid bruit    ??? Lumbago 07/22/2015   ??? Morbid obesity ([place]) 07/22/2015    BMI 35.3   ??? OSA on CPAP 07/22/2015    uses CPAP   ??? Other ill-defined conditions(799.89)     cholesterol   ??? Other ill-defined conditions(799.89)     heart cath 96   ??? Shoulder pain, acute    ??? Shoulder pain, bilateral    ??? Sinusitis, acute maxillary    ??? Unstable angina ([place]) 07/06/2015    ??? Vitamin D deficiency    ??? Weakness        Past Surgical History  Past Surgical History:   Procedure Laterality Date   ??? COLONOSCOPY N/A 03/01/2018    COLONOSCOPY/BMI 39 performed by Mazanec, Dorene Ar, MD at [facility]   ??? HX COLONOSCOPY     ??? HX HEART CATHETERIZATION  2016    without intervention   ??? HX HEENT      goiter/thyroid surgery   ??? HX THYROIDECTOMY         Family History  Family History   Problem Relation Age of Onset   ??? Stroke Sister    ??? Cancer Sister         cervical   ??? Stroke Brother    ??? Diabetes Other    ??? Hypertension Other        Social History  Social History     Socioeconomic History   ??? Marital status: SINGLE     Spouse name: Not on file   ??? Number of children: Not on file   ??? Years of education: Not on file   ??? Highest education level: Not on file   Tobacco Use   ??? Smoking status: Former Smoker     Last attempt to quit: 1971     Years since quitting: 48.7   ??? Smokeless tobacco: Never Used   ??? Tobacco comment: quit in 1971   Substance and Sexual Activity   ??? Alcohol use: No   ??? Drug use: No   Social History Narrative    ** Merged History Encounter **                    Allergy:  Allergies   Allergen Reactions   ??? Ativan [Lorazepam] Other (comments)     Made her feel crazy and chest pressure   ??? Ativan [Lorazepam] Other (comments)     Chest fullness   ??? Other Medication Palpitations     Some type of decongestant   ??? Other Medication Other (comments)     Doesn't take decongestants, but can't recall why         Current Medications:  Outpatient Encounter Medications as of 06/02/2018   Medication Sig Dispense Refill   ??? predniSONE (DELTASONE) 20 mg tablet Take 1 pill once a day after food. 30 Tab 1   ??? meloxicam (MOBIC) 7.5 mg tablet Take 7.5 mg by mouth two (2) times a day.     ??? folic acid 102 mcg tablet Take 400 mcg by mouth daily.     ???  pravastatin (PRAVACHOL) 80 mg tablet Take 1 Tab by mouth daily. 90 Tab 3   ??? lisinopril-hydroCHLOROthiazide (PRINZIDE, ZESTORETIC) 20-12.5 mg per  tablet Take 1 Tab by mouth daily. 90 Tab 3   ??? famotidine (PEPCID) 20 mg tablet take 1 tablet by mouth once daily 90 Tab 3   ??? nitroglycerin (NITROSTAT) 0.4 mg SL tablet Place 1 sl under the tongue q 5 min prn cp, max 3 sl in a 15-min time period. Call 911 if no relief after the 3rd sl. 1 Bottle prn   ??? aspirin 81 mg chewable tablet Take 1 Tab by mouth daily. 30 Tab 11     No facility-administered encounter medications on file as of 06/02/2018.            REVIEW OF SYSTEMS: The following systems were reviewed with patient today and were negative except for the following (depicted with an "X"):        "X" General  "X" Head and Neck  "X" Heart and Breathing  "X" Gastrointestinal   x Fever/chills   Hair loss  x Shortness of breath   Upset stomach    Falls   Dry mouth  x Coughing  x Diarrhea / constipation   x Wt loss   Mouth sores  x Wheezing   Heartburn    Wt gain  x Ringing ears   Chest pain   Dark or bloody stools    Night sweats  x Diff. swallowing   None of above   Nausea or vomiting    None of above   None of above      None of above                "X" Skin  "X" Neurology  "X" Urinary/Gyn  "X" Other    Easy bruising  x Numbness/ tingling   Female problems   Depression    Rashes  x Weakness  x Problems with urination   Feeling anxious    Sun sensitivity  x Headaches   None of above   Problems sleeping   X None of above   None of above     X None of above          Physical Exam:  Blood pressure 150/64, height '4\' 11"'  (1.499 m), weight 180 lb 6.4 oz (81.8 kg).  General:  Patient alert, cooperative and in no apparent distress.  HEENT: Pupils equally reactive to light and accommodation, noted scleral injection in both the eyes.  Neck supple, no lymphadenopathy, no thyromegaly.  No temporal tenderness with no jaw pain with opening and closing of her mouth noted.  Heart: Regular rate and rhythm, normal S1 and S2, audible murmur with no rubs or gallops.  Lungs: Clear to auscultation bilaterally.   Abdomen: Soft, nontender, no hepatosplenomegaly.  Skin:  No rashes. No nail abnormalities.  Neurologic:  Oriented, normal speech and affect.  Normal gait.    Extremities:  No edema in bilateral lower extremities with no cyanosis or clubbing.    Muskoskeletal Exam:     I examined the shoulders, elbows, wrists, MCPs, PIPs, DIPs and knees bilaterally for strength, range of motion, deformity, tenderness, swelling, and synovitis.      The findings are: Does have tenderness on palpation of the right and left 2nd-5th PIP joints with trace synovitis but no overlying warmth or redness.  Her ability to flex the fingers is limited with intact extension with a weak grip in both hands.  Does  have tenderness on palpation of the [facility] joint of both the thumbs with no synovitis, warmth or redness.  Does have tenderness on palpation of the shoulders both anteriorly as well as superiorly worse in the right than the left with limited abduction as well as internal rotation.  Does have tenderness on palpation of the C-spine as well as the paraspinal muscles of the neck with tenderness on palpation of the T-spine as well as the L-spine with bilateral SI joint tenderness.  Does have tenderness on palpation of the knees in the mid joint line with associated synovitis and joint crepitus on flexion as well as extension of the knees.  Does have tenderness on palpation of the ankles laterally with trace synovitis but no overlying warmth or redness.  Does have metatarsalgia in both feet with some tenderness on palpation of the right and left 1st-5th MTP joints with no overlying warmth or redness though.      Patient otherwise has a normal joint exam without other evidence of joint tenderness, synovitis, warmth, erythema, decreased ROM, weakness or deformities.         Radiology Reports Reviewed (if available):  Last 3 months  Xr Chest Pa Lat    Result Date: 04/02/2018  History: Chest Pain Two views chest COMPARISON: 07/31/2017 Findings: The  lungs are well expanded and clear. The cardiac silhouette is persistently enlarged, and the mediastinal contour, and osseous structures are stable.     Impression: Stable two-view chest.     Ct Urogram Wo Cont    Result Date: 04/02/2018  CT ABDOMEN AND PELVIS WITHOUT CONTRAST HISTORY: flank pain, 77 years Female Left flank pain Urinary frequency COMPARISON: CT abdomen March 20, 2008 TECHNIQUE: No oral or intravenous contrast administered. Axial helical CT images were obtained from above the diaphragm through the pelvis.  Radiation dose reduction techniques were used for this study:  Our CT scanners use one or all of the following: Automated exposure control, adjustment of the mA and/or kVp according to patient's size, iterative reconstruction. FINDINGS: ABDOMEN: Minimal dependent subsegmental atelectasis bilateral lung bases.  New small pericardial effusion partially visualized.  Normal-appearing liver, gallbladder, spleen, pancreas. Mild fullness of the bilateral adrenal glands unchanged, nonspecific.  Normal appearance of the bilateral kidneys.  There is no evidence of hydronephrosis or nephrolithiasis.  Mild calcific atherosclerosis of a normal caliber abdominal aorta.  No evidence of significant lymphadenopathy. Normal-appearing small bowel.  No evidence of intraperitoneal free air or free fluid. PELVIS: Normal-appearing urinary bladder.  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  Normal-appearing bilateral adnexa. Normal-appearing colon and appendix.  No evidence of pelvic free fluid.  No evidence of significant inguinal or pelvic sidewall lymphadenopathy.  Visualized osseous structures unremarkable.     IMPRESSION: 1.  No acute intra-abdominal pathology identified. 2.  New small pericardial effusion partially visualized. 3.  Other chronic findings as above.       Lab Reports Reviewed (if available): Last 3 months    Lab Only on 06/01/2018    Component Date Value Ref Range Status   ??? VITAMIN D, 25-HYDROXY 06/01/2018 25.3* 30.0 - 100.0 ng/mL Final    Comment: Vitamin D deficiency has been defined by the [place] practice guideline as a  level of serum 25-OH vitamin D less than 20 ng/mL (1,2).  The Endocrine Society went on to further define vitamin D  insufficiency as a level between 21 and 29 ng/mL (2).  1. IOM (Institute  of Medicine). 2010. Dietary reference     intakes for calcium and D. [place]: The     [employer].  2. Holick MF, Binkley NC, Bischoff-Ferrari HA, et al.     Evaluation, treatment, and prevention of vitamin D     deficiency: an Endocrine Society clinical practice     guideline. JCEM. 2011 Jul; 96(7):1911-30.     ??? Cholesterol, total 06/01/2018 151  100 - 199 mg/dL Final   ??? Triglyceride 06/01/2018 80  0 - 149 mg/dL Final   ??? HDL Cholesterol 06/01/2018 55  >39 mg/dL Final   ??? VLDL, calculated 06/01/2018 16  5 - 40 mg/dL Final   ??? LDL, calculated 06/01/2018 80  0 - 99 mg/dL Final   ??? Hemoglobin A1c 06/01/2018 6.0* 4.8 - 5.6 % Final    Comment:          Prediabetes: 5.7 - 6.4           Diabetes: >6.4           Glycemic control for adults with diabetes: <7.0     ??? Estimated average glucose 06/01/2018 126  mg/dL Final   ??? Glucose 06/01/2018 89  65 - 99 mg/dL Final   ??? BUN 06/01/2018 15  8 - 27 mg/dL Final   ??? Creatinine 06/01/2018 1.07* 0.57 - 1.00 mg/dL Final   ??? GFR est non-AA 06/01/2018 49* >59 mL/min/1.73 Final   ??? GFR est AA 06/01/2018 57* >59 mL/min/1.73 Final   ??? BUN/Creatinine ratio 06/01/2018 14  12 - 28 Final   ??? Sodium 06/01/2018 140  134 - 144 mmol/L Final   ??? Potassium 06/01/2018 4.2  3.5 - 5.2 mmol/L Final   ??? Chloride 06/01/2018 101  96 - 106 mmol/L Final   ??? CO2 06/01/2018 22  20 - 29 mmol/L Final   ??? Calcium 06/01/2018 9.2  8.7 - 10.3 mg/dL Final   ??? Protein, total 06/01/2018 8.2  6.0 - 8.5 g/dL Final   ??? Albumin 06/01/2018 3.8  3.5 - 4.8 g/dL Final    ??? GLOBULIN, TOTAL 06/01/2018 4.4  1.5 - 4.5 g/dL Final   ??? A-G Ratio 06/01/2018 0.9* 1.2 - 2.2 Final   ??? Bilirubin, total 06/01/2018 0.3  0.0 - 1.2 mg/dL Final   ??? Alk. phosphatase 06/01/2018 60  39 - 117 IU/L Final   ??? AST (SGOT) 06/01/2018 12  0 - 40 IU/L Final   ??? ALT (SGPT) 06/01/2018 9  0 - 32 IU/L Final   Hospital Outpatient Visit on 05/12/2018   Component Date Value Ref Range Status   ??? WBC 05/12/2018 8.3  4.3 - 11.1 K/uL Final   ??? RBC 05/12/2018 3.40* 4.05 - 5.25 M/uL Final   ??? HGB 05/12/2018 8.9* 11.7 - 15.4 g/dL Final   ??? HCT 05/12/2018 28.4* 35.8 - 46.3 % Final   ??? MCV 05/12/2018 83.5  79.6 - 97.8 FL Final   ??? MCH 05/12/2018 26.2  26.1 - 32.9 PG Final   ??? MCHC 05/12/2018 31.3* 31.4 - 35.0 g/dL Final   ??? RDW 05/12/2018 16.5* 11.9 - 14.6 % Final   ??? PLATELET 05/12/2018 208  150 - 450 K/uL Final   ??? MPV 05/12/2018 9.5  9.4 - 12.3 FL Final   ??? ABSOLUTE NRBC 05/12/2018 0.00  0.0 - 0.2 K/uL Final    **Note: Absolute NRBC parameter is now reported with Hemogram**   ??? DF 05/12/2018 AUTOMATED    Final   ??? NEUTROPHILS 05/12/2018  72  43 - 78 % Final   ??? LYMPHOCYTES 05/12/2018 19  13 - 44 % Final   ??? MONOCYTES 05/12/2018 7  4.0 - 12.0 % Final   ??? EOSINOPHILS 05/12/2018 1  0.5 - 7.8 % Final   ??? BASOPHILS 05/12/2018 0  0.0 - 2.0 % Final   ??? IMMATURE GRANULOCYTES 05/12/2018 1  0.0 - 5.0 % Final   ??? ABS. NEUTROPHILS 05/12/2018 6.0  1.7 - 8.2 K/UL Final   ??? ABS. LYMPHOCYTES 05/12/2018 1.5  0.5 - 4.6 K/UL Final   ??? ABS. MONOCYTES 05/12/2018 0.6  0.1 - 1.3 K/UL Final   ??? ABS. EOSINOPHILS 05/12/2018 0.1  0.0 - 0.8 K/UL Final   ??? ABS. BASOPHILS 05/12/2018 0.0  0.0 - 0.2 K/UL Final   ??? ABS. IMM. GRANS. 05/12/2018 0.0  0.0 - 0.5 K/UL Final   ??? Sodium 05/12/2018 141  136 - 145 mmol/L Final   ??? Potassium 05/12/2018 3.6  3.5 - 5.1 mmol/L Final   ??? Chloride 05/12/2018 106  98 - 107 mmol/L Final   ??? CO2 05/12/2018 26  21 - 32 mmol/L Final   ??? Anion gap 05/12/2018 9  7 - 16 mmol/L Final    ??? Glucose 05/12/2018 153* 65 - 100 mg/dL Final   ??? BUN 05/12/2018 24* 8 - 23 MG/DL Final   ??? Creatinine 05/12/2018 1.40* 0.6 - 1.0 MG/DL Final   ??? GFR est AA 05/12/2018 47* >60 ml/min/1.33m Final   ??? GFR est non-AA 05/12/2018 39* >60 ml/min/1.760mFinal    Comment: (NOTE)  Estimated GFR is calculated using the Modification of Diet in Renal   Disease (MDRD) Study equation, reported for both African Americans   (GFRAA) and non-African Americans (GFRNA), and normalized to 1.7316m body surface area. The physician must decide which value applies to   the patient. The MDRD study equation should only be used in   individuals age 64 49 older. It has not been validated for the   following: pregnant women, patients with serious comorbid conditions,   or on certain medications, or persons with extremes of body size,   muscle mass, or nutritional status.     ??? Calcium 05/12/2018 9.4  8.3 - 10.4 MG/DL Final   ??? Bilirubin, total 05/12/2018 0.4  0.2 - 1.1 MG/DL Final   ??? ALT (SGPT) 05/12/2018 15  12 - 65 U/L Final   ??? AST (SGOT) 05/12/2018 8* 15 - 37 U/L Final   ??? Alk. phosphatase 05/12/2018 61  50 - 136 U/L Final   ??? Protein, total 05/12/2018 8.9* 6.3 - 8.2 g/dL Final   ??? Albumin 05/12/2018 2.9* 3.2 - 4.6 g/dL Final   ??? Globulin 05/12/2018 6.0* 2.3 - 3.5 g/dL Final   ??? A-G Ratio 05/12/2018 0.5* 1.2 - 3.5   Final   ??? Ferritin 05/12/2018 557* 8 - 388 NG/ML Final   ??? Reticulocyte count 05/12/2018 1.6  0.3 - 2.0 % Final   ??? Absolute Retic Cnt. 05/12/2018 0.0544  0.026 - 0.095 M/ul Final   ??? Immature Retic Fraction 05/12/2018 14.4  3.0 - 15.9 % Final   ??? Retic Hgb Conc. 05/12/2018 30  29 - 35 pg Final   ??? Iron 05/12/2018 33* 35 - 150 ug/dL Final    Comment: Known Interfering Substances section:  "Iron values may be falsely elevated in  serum samples from patients with  anticoagulants (e.g., hemodialysis patients)."  Limitations of Procedure section:  "Turbidity resulting from precipitation of  fibrinogen in the serum of patients treated  with anticoagulants (e.g. hemodialysis  patients) may cause spuriously elevated  iron results."     ??? TIBC 05/12/2018 214* 250 - 450 ug/dL Final   ??? Transferrin Saturation 05/12/2018 15  % Final   ??? Sed rate, automated 05/12/2018 115* 0 - 30 mm/hr Final   Office Visit on 05/05/2018   Component Date Value Ref Range Status   ??? C-Reactive Protein, Qt 05/05/2018 39* 0 - 10 mg/L Final   ??? CCP Antibodies IgG/IgA 05/05/2018 7  0 - 19 units Final    Comment:                           Negative               <20                            Weak positive      20 - 39                            Moderate positive  40 - 59                            Strong positive        >59     ??? Rheumatoid factor 05/05/2018 <10.0  0.0 - 13.9 IU/mL Final   ??? Antinuclear Antibodies Direct 05/05/2018 Negative  Negative Final   Admission on 04/02/2018, Discharged on 04/02/2018   Component Date Value Ref Range Status   ??? WBC 04/02/2018 11.0  4.3 - 11.1 K/uL Final   ??? RBC 04/02/2018 3.33* 4.05 - 5.2 M/uL Final   ??? HGB 04/02/2018 8.8* 11.7 - 15.4 g/dL Final   ??? HCT 04/02/2018 28.0* 35.8 - 46.3 % Final   ??? MCV 04/02/2018 84.1  79.6 - 97.8 FL Final   ??? MCH 04/02/2018 26.4  26.1 - 32.9 PG Final   ??? MCHC 04/02/2018 31.4  31.4 - 35.0 g/dL Final   ??? RDW 04/02/2018 15.5* 11.9 - 14.6 % Final   ??? PLATELET 04/02/2018 240  150 - 450 K/uL Final   ??? MPV 04/02/2018 10.2  9.4 - 12.3 FL Final   ??? ABSOLUTE NRBC 04/02/2018 0.00  0.0 - 0.2 K/uL Final    **Note: Absolute NRBC parameter is now reported with Hemogram**   ??? Sodium 04/02/2018 135* 136 - 145 mmol/L Final   ??? Potassium 04/02/2018 3.7  3.5 - 5.1 mmol/L Final   ??? Chloride 04/02/2018 100  98 - 107 mmol/L Final   ??? CO2 04/02/2018 27  21 - 32 mmol/L Final   ??? Anion gap 04/02/2018 8  7 - 16 mmol/L Final   ??? Glucose 04/02/2018 102* 65 - 100 mg/dL Final    Comment: 47 - 60 mg/dl Consistent with, but not fully diagnostic of hypoglycemia.   101 - 125 mg/dl Impaired fasting glucose/consistent with pre-diabetes mellitus  > 126 mg/dl Fasting glucose consistent with overt diabetes mellitus     ??? BUN 04/02/2018 9  8 - 23 MG/DL Final   ??? Creatinine 04/02/2018 1.04* 0.6 - 1.0 MG/DL Final   ??? GFR est AA 04/02/2018 >60  >60 ml/min/1.78m Final   ??? GFR est non-AA 04/02/2018 54* >60 ml/min/1.766mFinal    Comment: (NOTE)  Estimated GFR  is calculated using the Modification of Diet in Renal   Disease (MDRD) Study equation, reported for both African Americans   (GFRAA) and non-African Americans (GFRNA), and normalized to 1.18m   body surface area. The physician must decide which value applies to   the patient. The MDRD study equation should only be used in   individuals age 5825or older. It has not been validated for the   following: pregnant women, patients with serious comorbid conditions,   or on certain medications, or persons with extremes of body size,   muscle mass, or nutritional status.     ??? Calcium 04/02/2018 8.8  8.3 - 10.4 MG/DL Final   ??? Bilirubin, total 04/02/2018 0.5  0.2 - 1.1 MG/DL Final   ??? ALT (SGPT) 04/02/2018 16  12 - 65 U/L Final   ??? AST (SGOT) 04/02/2018 18  15 - 37 U/L Final   ??? Alk. phosphatase 04/02/2018 65  50 - 136 U/L Final   ??? Protein, total 04/02/2018 9.1* 6.3 - 8.2 g/dL Final   ??? Albumin 04/02/2018 2.9* 3.2 - 4.6 g/dL Final   ??? Globulin 04/02/2018 6.2* 2.3 - 3.5 g/dL Final   ??? A-G Ratio 04/02/2018 0.5* 1.2 - 3.5   Final   ??? Color 04/02/2018 YELLOW    Final   ??? Appearance 04/02/2018 CLEAR    Final   ??? Specific gravity 04/02/2018 1.011  1.001 - 1.023   Final   ??? pH (UA) 04/02/2018 7.0  5.0 - 9.0   Final   ??? Protein 04/02/2018 NEGATIVE   NEG mg/dL Final   ??? Glucose 04/02/2018 NEGATIVE   mg/dL Final   ??? Ketone 04/02/2018 NEGATIVE   NEG mg/dL Final   ??? Bilirubin 04/02/2018 NEGATIVE   NEG   Final   ??? Blood 04/02/2018 NEGATIVE   NEG   Final   ??? Urobilinogen 04/02/2018 1.0  0.2 - 1.0 EU/dL Final    ??? Nitrites 04/02/2018 NEGATIVE   NEG   Final   ??? Leukocyte Esterase 04/02/2018 NEGATIVE   NEG   Final   ??? Lipase 04/02/2018 86  73 - 393 U/L Final   ??? Ventricular Rate 04/02/2018 86  BPM Final   ??? Atrial Rate 04/02/2018 86  BPM Final   ??? P-R Interval 04/02/2018 192  ms Final   ??? QRS Duration 04/02/2018 86  ms Final   ??? Q-T Interval 04/02/2018 382  ms Final   ??? QTC Calculation ([place]) 04/02/2018 457  ms Final   ??? Calculated P Axis 04/02/2018 15  degrees Final   ??? Calculated R Axis 04/02/2018 -3  degrees Final   ??? Calculated T Axis 04/02/2018 40  degrees Final   ??? Diagnosis 04/02/2018    Final                    Value:Normal sinus rhythm  Normal ECG  When compared with ECG of 31-Jul-2017 17:09,  PR interval has decreased  Nonspecific T wave abnormality no longer evident in Lateral leads  Confirmed by BITTRICK  MD ([facility]), JOrland Penman(([id number] on 04/03/2018 8:31:23 AM     Office Visit on 03/31/2018   Component Date Value Ref Range Status   ??? Color (UA POC) 03/31/2018 Yellow   Final   ??? Clarity (UA POC) 03/31/2018 Slightly Cloudy   Final   ??? Glucose (UA POC) 03/31/2018 Negative  Negative Final   ??? Bilirubin (UA POC) 03/31/2018 Negative  Negative Final   ??? Ketones (UA  POC) 03/31/2018 Negative  Negative Final   ??? Specific gravity (UA POC) 03/31/2018 1.015  1.001 - 1.035 Final   ??? Blood (UA POC) 03/31/2018 Negative  Negative Final   ??? pH (UA POC) 03/31/2018 7.0  4.6 - 8.0 Final   ??? Protein (UA POC) 03/31/2018 Trace  Negative Final   ??? Urobilinogen (UA POC) 03/31/2018 normal  0.2 - 1 Final   ??? Nitrites (UA POC) 03/31/2018 Negative  Negative Final   ??? Leukocyte esterase (UA POC) 03/31/2018 1+  Negative Final    SMALL   ??? Urine Culture, Routine 03/31/2018    Final                    Value:Mixed urogenital flora  Less than 10,000 colonies/mL           The results above were reviewed and discussed with patient.         Assessment/Plan:   Arta Stump is a 77 y.o. female who presents with:      1. PMR (polymyalgia rheumatica) ([place]): Was instructed to stop meloxicam and was started on prednisone 20 mg 1 pill to be taken once a day after breakfast.  I did explain to the patient that she would need to avoid any over-the-counter NSAID's while on prednisone as well.  Patient and daughter did express understanding.  -     predniSONE (DELTASONE) 20 mg tablet; Take 1 pill once a day after food.    2. Elevated erythrocyte sedimentation rate: I do expect to repeat the ESR as well as CRP on her follow-up visit with me to see if the prednisone does seem to have gotten her inflammatory markers down.      Disease activity plan:  As stated above.    Steroid management plan:  As stated above, if applicable.    Pain management plan:  As stated above, if applicable.    Weight management plan:  Weight loss through diet and exercise is always encouraged    Disease prognosis: Good        I appreciate the opportunity to continue to participate in the care of this patient.     Follow-up and Dispositions    ?? Return in about 6 weeks (around 07/14/2018).       Electronically signed by:  Jeffie Pollock, MD      This note was dictated using dragon voice recognition software.  It has been proofread, but there may still exist voice recognition errors that the author did not detect.

## 2018-06-13 ENCOUNTER — Ambulatory Visit: Attending: Family Medicine | Primary: Family Medicine

## 2018-06-13 ENCOUNTER — Ambulatory Visit: Admit: 2018-06-13 | Discharge: 2018-06-13 | Payer: MEDICARE | Attending: Family Medicine | Primary: Family Medicine

## 2018-06-13 DIAGNOSIS — R700 Elevated erythrocyte sedimentation rate: Secondary | ICD-10-CM

## 2018-06-13 NOTE — Progress Notes (Signed)
SUBJECTIVE:   Sarah Chavez is a 77 y.o. female past medical history significant for hypertension, diabetes, high cholesterol, CAD and vitamin D deficiency.  Several visits ago I discovered the patient to  have a normocytic anemia.  I referred her to hematology.  Her work-up was essentially unremarkable except for an increase in sed rate.  She was subsequently referred to rheumatology for ultimately she was diagnosed with polymyalgia rheumatica.  She is been placed on 20 mg of prednisone.  She reports that her general myalgias and arthralgias have significantly improved.  She states that she is being followed closely by the rheumatologist regarding her condition as well as pertaining to the administration of prednisone.  She reports no adverse effects from the prednisone.  No GI distress is reported.  Review of systems reveals no complaints of chest pain, shortness of breath, orthopnea or PND.    HPI  See above    Past Medical History, Past Surgical History, Family history, Social History, and Medications were all reviewed with the patient today and updated as necessary.       Current Outpatient Medications   Medication Sig Dispense Refill   ??? predniSONE (DELTASONE) 20 mg tablet Take 1 pill once a day after food. 30 Tab 1   ??? folic acid 400 mcg tablet Take 400 mcg by mouth daily.     ??? pravastatin (PRAVACHOL) 80 mg tablet Take 1 Tab by mouth daily. 90 Tab 3   ??? lisinopril-hydroCHLOROthiazide (PRINZIDE, ZESTORETIC) 20-12.5 mg per tablet Take 1 Tab by mouth daily. 90 Tab 3   ??? famotidine (PEPCID) 20 mg tablet take 1 tablet by mouth once daily 90 Tab 3   ??? nitroglycerin (NITROSTAT) 0.4 mg SL tablet Place 1 sl under the tongue q 5 min prn cp, max 3 sl in a 15-min time period. Call 911 if no relief after the 3rd sl. 1 Bottle prn   ??? aspirin 81 mg chewable tablet Take 1 Tab by mouth daily. 30 Tab 11     Allergies   Allergen Reactions   ??? Ativan [Lorazepam] Other (comments)     Made her feel crazy and chest  pressure   ??? Ativan [Lorazepam] Other (comments)     Chest fullness   ??? Other Medication Palpitations     Some type of decongestant   ??? Other Medication Other (comments)     Doesn't take decongestants, but can't recall why     Patient Active Problem List   Diagnosis Code   ??? HTN (hypertension) I10   ??? Diabetes mellitus type 2, controlled (HCC) E11.9   ??? Coronary artery disease involving native coronary artery of native heart without angina pectoris I25.10   ??? Dyslipidemia E78.5   ??? OSA on CPAP G47.33, Z99.89   ??? Severe obesity (HCC) E66.01   ??? Elevated sed rate R70.0   ??? Normocytic anemia D64.9     Past Medical History:   Diagnosis Date   ??? Abdominal pain 07/22/2015   ??? Anemia    ??? Back pain    ??? Bursitis of shoulder    ??? CAD (coronary artery disease)    ??? Cerumen impaction    ??? Chest pain, atypical 07/22/2015   ??? Diabetes (HCC)     no meds, A1c 09/28/17 6.6; avg bs 105; denies ss of hypo   ??? Diabetes mellitus out of control (HCC)    ??? Endocrine disease     thyroid   ??? Fatigue    ???  GERD (gastroesophageal reflux disease)     controlled with medicaitons   ??? Hip pain, right    ??? Hyperlipidemia, mild    ??? Hypertension     managed with medication   ??? Hypertension associated with diabetes (HCC) 07/22/2015   ??? 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 Left carotid bruit    ??? Lumbago 07/22/2015   ??? Morbid obesity (HCC) 07/22/2015    BMI 35.3   ??? OSA on CPAP 07/22/2015    uses CPAP   ??? Other ill-defined conditions(799.89)     cholesterol   ??? Other ill-defined conditions(799.89)     heart cath 96   ??? Shoulder pain, acute    ??? Shoulder pain, bilateral    ??? Sinusitis, acute maxillary    ??? Unstable angina (HCC) 07/06/2015   ??? Vitamin D deficiency    ??? Weakness      Past Surgical History:   Procedure Laterality Date   ??? COLONOSCOPY N/A 03/01/2018    COLONOSCOPY/BMI 39 performed by Mazanec, Worthy Rancher, MD at [facility] ENDOSCOPY   ??? HX COLONOSCOPY     ??? HX HEART CATHETERIZATION  2016    without intervention   ??? HX HEENT       goiter/thyroid surgery   ??? HX THYROIDECTOMY       Family History   Problem Relation Age of Onset   ??? Stroke Sister    ??? Cancer Sister         cervical   ??? Stroke Brother    ??? Diabetes Other    ??? Hypertension Other      Social History     Tobacco Use   ??? Smoking status: Former Smoker     Last attempt to quit: 1971     Years since quitting: 48.7   ??? Smokeless tobacco: Never Used   ??? Tobacco comment: quit in 1971   Substance Use Topics   ??? Alcohol use: No         Review of Systems  See above    OBJECTIVE:  Visit Vitals  BP 128/72   Ht 4\' 11"  (1.499 m)   Wt 177 lb (80.3 kg)   BMI 35.75 kg/m??        Physical Exam   Constitutional: She is oriented to person, place, and time. She appears well-developed and well-nourished.   HENT:   Head: Normocephalic and atraumatic.   Eyes: Pupils are equal, round, and reactive to light. EOM are normal.   Neck: Normal range of motion. Neck supple. No JVD present. No thyromegaly present.   Cardiovascular: Normal rate, regular rhythm and normal heart sounds. Exam reveals no gallop and no friction rub.   No murmur heard.  Pulmonary/Chest: Effort normal. No respiratory distress. She has no wheezes. She has no rales.   Abdominal: Soft. There is no tenderness. There is no rebound and no guarding.   Musculoskeletal: Normal range of motion. She exhibits no edema.   Neurological: She is alert and oriented to person, place, and time.   Skin: No rash noted.   Psychiatric: She has a normal mood and affect.       Medical problems and test results were reviewed with the patient today.         ASSESSMENT and PLAN    1.  Increased sed rate.  Follow-up with rheumatology.    2.  Polymyalgia rheumatica.  Per rheumatology.    3.  Hypertension.  Blood pressure is well maintained.  Continue current therapy.    4.  Diabetes.  A1c is 6.0.  Monitor blood sugars closely with the addition of prednisone.  Encourage yearly retinal exams daily feet exams.  Offered flu shot today.  Patient respectfully declines.    5.   High cholesterol.  LDL is 80.  Previously 83.  Continue current therapy.  Liver enzymes remain normal.  May need to switch to different statin if LDL remains above goal.    6.  Vitamin D deficiency.  Vitamin D level is 25.  Previously 17.  Congratulated the patient on improving numbers.  Continue.    7.  CAD.  No chest pain reported.    8.  Normocytic anemia.  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  Continue to follow clinically and follow-up as scheduled with hematology.

## 2018-06-13 NOTE — Progress Notes (Signed)
SUBJECTIVE:   Sarah Chavez is a 77 y.o. female past medical history significant for hypertension, diabetes, high cholesterol, CAD and vitamin D deficiency.  Several visits ago I discovered the patient to  have a normocytic anemia.  I referred her to hematology.  Her work-up was essentially unremarkable except for an increase in sed rate.  She was subsequently referred to rheumatology for ultimately she was diagnosed with polymyalgia rheumatica.  She is been placed on 20 mg of prednisone.  She reports that her general myalgias and arthralgias have significantly improved.  She states that she is being followed closely by the rheumatologist regarding her condition as well as pertaining to the administration of prednisone.  She reports no adverse effects from the prednisone.  No GI distress is reported.  Review of systems reveals no complaints of chest pain, shortness of breath, orthopnea or PND.    HPI  See above    Past Medical History, Past Surgical History, Family history, Social History, and Medications were all reviewed with the patient today and updated as necessary.       Current Outpatient Medications   Medication Sig Dispense Refill   ??? predniSONE (DELTASONE) 20 mg tablet Take 1 pill once a day after food. 30 Tab 1   ??? folic acid 400 mcg tablet Take 400 mcg by mouth daily.     ??? pravastatin (PRAVACHOL) 80 mg tablet Take 1 Tab by mouth daily. 90 Tab 3   ??? lisinopril-hydroCHLOROthiazide (PRINZIDE, ZESTORETIC) 20-12.5 mg per tablet Take 1 Tab by mouth daily. 90 Tab 3   ??? famotidine (PEPCID) 20 mg tablet take 1 tablet by mouth once daily 90 Tab 3   ??? nitroglycerin (NITROSTAT) 0.4 mg SL tablet Place 1 sl under the tongue q 5 min prn cp, max 3 sl in a 15-min time period. Call 911 if no relief after the 3rd sl. 1 Bottle prn   ??? aspirin 81 mg chewable tablet Take 1 Tab by mouth daily. 30 Tab 11     Allergies   Allergen Reactions   ??? Ativan [Lorazepam] Other (comments)      Made her feel crazy and chest pressure   ??? Ativan [Lorazepam] Other (comments)     Chest fullness   ??? Other Medication Palpitations     Some type of decongestant   ??? Other Medication Other (comments)     Doesn't take decongestants, but can't recall why     Patient Active Problem List   Diagnosis Code   ??? HTN (hypertension) I10   ??? Diabetes mellitus type 2, controlled (HCC) E11.9   ??? Coronary artery disease involving native coronary artery of native heart without angina pectoris I25.10   ??? Dyslipidemia E78.5   ??? OSA on CPAP G47.33, Z99.89   ??? Severe obesity (HCC) E66.01   ??? Elevated sed rate R70.0   ??? Normocytic anemia D64.9     Past Medical History:   Diagnosis Date   ??? Abdominal pain 07/22/2015   ??? Anemia    ??? Back pain    ??? Bursitis of shoulder    ??? CAD (coronary artery disease)    ??? Cerumen impaction    ??? Chest pain, atypical 07/22/2015   ??? Diabetes (HCC)     no meds, A1c 09/28/17 6.6; avg bs 105; denies ss of hypo   ??? Diabetes mellitus out of control (HCC)    ??? Endocrine disease     thyroid   ??? Fatigue    ???  GERD (gastroesophageal reflux disease)     controlled with medicaitons   ??? Hip pain, right    ??? Hyperlipidemia, mild    ??? Hypertension     managed with medication   ??? Hypertension associated with diabetes (HCC) 07/22/2015   ??? 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 Left carotid bruit    ??? Lumbago 07/22/2015   ??? Morbid obesity (HCC) 07/22/2015    BMI 35.3   ??? OSA on CPAP 07/22/2015    uses CPAP   ??? Other ill-defined conditions(799.89)     cholesterol   ??? Other ill-defined conditions(799.89)     heart cath 96   ??? Shoulder pain, acute    ??? Shoulder pain, bilateral    ??? Sinusitis, acute maxillary    ??? Unstable angina (HCC) 07/06/2015   ??? Vitamin D deficiency    ??? Weakness      Past Surgical History:   Procedure Laterality Date   ??? COLONOSCOPY N/A 03/01/2018    COLONOSCOPY/BMI 39 performed by Mazanec, Worthy Rancher, MD at [facility] ENDOSCOPY   ??? HX COLONOSCOPY     ??? HX HEART CATHETERIZATION  2016     without intervention   ??? HX HEENT      goiter/thyroid surgery   ??? HX THYROIDECTOMY       Family History   Problem Relation Age of Onset   ??? Stroke Sister    ??? Cancer Sister         cervical   ??? Stroke Brother    ??? Diabetes Other    ??? Hypertension Other      Social History     Tobacco Use   ??? Smoking status: Former Smoker     Last attempt to quit: 1971     Years since quitting: 48.7   ??? Smokeless tobacco: Never Used   ??? Tobacco comment: quit in 1971   Substance Use Topics   ??? Alcohol use: No         Review of Systems  See above    OBJECTIVE:  Visit Vitals  BP 128/72   Ht 4\' 11"  (1.499 m)   Wt 177 lb (80.3 kg)   BMI 35.75 kg/m??        Physical Exam   Constitutional: She is oriented to person, place, and time. She appears well-developed and well-nourished.   HENT:   Head: Normocephalic and atraumatic.   Eyes: Pupils are equal, round, and reactive to light. EOM are normal.   Neck: Normal range of motion. Neck supple. No JVD present. No thyromegaly present.   Cardiovascular: Normal rate, regular rhythm and normal heart sounds. Exam reveals no gallop and no friction rub.   No murmur heard.  Pulmonary/Chest: Effort normal. No respiratory distress. She has no wheezes. She has no rales.   Abdominal: Soft. There is no tenderness. There is no rebound and no guarding.   Musculoskeletal: Normal range of motion. She exhibits no edema.   Neurological: She is alert and oriented to person, place, and time.   Skin: No rash noted.   Psychiatric: She has a normal mood and affect.       Medical problems and test results were reviewed with the patient today.         ASSESSMENT and PLAN    1.  Increased sed rate.  Follow-up with rheumatology.    2.  Polymyalgia rheumatica.  Per rheumatology.    3.  Hypertension.  Blood pressure is well maintained.  Continue current therapy.    4.  Diabetes.  A1c is 6.0.  Monitor blood sugars closely with the addition of prednisone.  Encourage yearly retinal exams daily feet exams.  Offered  flu shot today.  Patient respectfully declines.    5.  High cholesterol.  LDL is 80.  Previously 83.  Continue current therapy.  Liver enzymes remain normal.  May need to switch to different statin if LDL remains above goal.    6.  Vitamin D deficiency.  Vitamin D level is 25.  Previously 17.  Congratulated the patient on improving numbers.  Continue.    7.  CAD.  No chest pain reported.    8.  Normocytic anemia.  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  Continue to follow clinically and follow-up as scheduled with hematology.

## 2018-07-13 ENCOUNTER — Ambulatory Visit: Attending: Rheumatology | Primary: Family Medicine

## 2018-07-13 ENCOUNTER — Ambulatory Visit: Admit: 2018-07-13 | Discharge: 2018-07-13 | Payer: MEDICARE | Attending: Rheumatology | Primary: Family Medicine

## 2018-07-13 DIAGNOSIS — M353 Polymyalgia rheumatica: Secondary | ICD-10-CM

## 2018-07-13 MED ORDER — PREDNISONE 10 MG TAB
10 mg | ORAL_TABLET | Freq: Every day | ORAL | 0 refills | Status: DC
Start: 2018-07-13 — End: 2018-09-05

## 2018-07-13 NOTE — Progress Notes (Signed)
Progress Notes by Ethlyn Daniels, MD at 07/13/18 1440                Author: Ethlyn Daniels, MD  Service: --  Author Type: Physician       Filed: 07/13/18 2249  Encounter Date: 07/13/2018  Status: Signed          Editor: Ethlyn Daniels, MD (Physician)               [facility]   Jeffie Pollock, M.D.   [address], [place], [place], [place], [address]   Office : [phone], Fax: [phone]         RHEUMATOLOGY OFFICE VISIT NOTE   Date of Visit:  07/13/2018 3:07  PM      Patient Information:   Name:  Sarah Chavez   DOB:  31-May-1941   Age:  77 y.o.    Gender:  female         Sarah Chavez is here today for  follow-up of PMR.         Last visit: 06/02/2018         History of Present Illness: On talking to the patient today she states that she has had a cough with white productive sputum with no fever or chills. Has not had any shortness of breath but has  had occasional chest pain secondary to being in a lot of stress. This episode of her chest pain was just once.  With the chest pain she has not had any radiating pain down the left arm or associated diaphoresis.  Denies any temporal headaches with no  double vision with no blurring of vision either. Has not had any jaw pain.       Since the last visit, patient is feeling "better"         Pain: 8/10   Location:  Bilateral shoulder pain with some neck stiffness with some pain with neck ROM. No headaches with no pain in the para spinal muscles. No wrist pain or swelling with no warmth and redness. Occasional lower back pain with some mid back pain. Some  right knee pain with swelling with occasional buckling with no warmth and redness.    Quality:  Deep achy pain.    Modifying Factors:  End of the day the pain and stiffness is the worst.    Associated Symptoms:  AM Stiffness: 1 hour; Intermittent tingling and numbness from the right shoulder to the fingertips with no radiating pain down the right  arm. Some limitations with washing her hair with no limitations with dressing and undressing.  Some difficulty opening jars and buttoning and unbuttoning. Intermittent tingling and numbness of the left hand. No tingling,  numbness or pain down the legs. Some right leg weakness.    Fatigue: 3/10   MDHAQ: 1.2         Last TB screen:10+ years   TB result:negative   Current dose of steroids:none   How long on current dose of steroids:na   How long on continuous steroid therapy:na   ??   Past DMARDs, if applicable (methotrexate, plaquenil/hydroxychloroquine, sulfasalazine, Arava/leflunomide):none   ??   Past biologics, if applicable  (enbrel, humira, simponi, cimzia, [place], [place], remicade, simponi aria, actemra, rituximab, [place], cosentyx):none   ??   Past NSAIDs, if applicable (motrin, aleve, naproxen, advil, ibuprofen, celebrex, voltaren/diclofenac, etc.) ibuprofen.   ??   ??   Last BMD: 2013   Past osteoporosis  drugs, if applicable (fosamax, actonel, boniva, reclast, prolia, forteo):none   ??   BMI:36.48   Current exercise regimen, if NFA:OZHY   Current vitamin D dose:none   Current calcium dose:none   Fractures since last visit, if any: none   ??         The patient otherwise has no significant interval changes in health or medical history to report.       History Reviewed:      Past Medical History     Past Medical History:        Diagnosis  Date         ?  Abdominal pain  07/22/2015     ?  Anemia       ?  Back pain       ?  Bursitis of shoulder       ?  CAD (coronary artery disease)       ?  Cerumen impaction       ?  Chest pain, atypical  07/22/2015     ?  Diabetes ([place])            no meds, A1c 09/28/17 6.6; avg bs 105; denies ss of hypo         ?  Diabetes mellitus out of control [facility])       ?  Endocrine disease            thyroid         ?  Fatigue       ?  GERD (gastroesophageal reflux disease)            controlled with medicaitons         ?  Hip pain, right       ?  Hyperlipidemia, mild       ?   Hypertension            managed with medication         ?  Hypertension associated with diabetes ([place])  07/22/2015     ?  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  Left carotid bruit       ?  Lumbago  07/22/2015     ?  Morbid obesity ([place])  07/22/2015          BMI 35.3         ?  OSA on CPAP  07/22/2015          uses CPAP         ?  Other ill-defined conditions(799.89)            cholesterol         ?  Other ill-defined conditions(799.89)            heart cath 96         ?  Shoulder pain, acute       ?  Shoulder pain, bilateral       ?  Sinusitis, acute maxillary       ?  Unstable angina ([place])  07/06/2015     ?  Vitamin D deficiency           ?  Weakness             Past Surgical History     Past Surgical History:         Procedure  Laterality  Date          ?  COLONOSCOPY  N/A  03/01/2018          COLONOSCOPY/BMI 39 performed by Mazanec, Dorene Ar, MD at [facility]COPY          ?  HX COLONOSCOPY         ?  HX HEART CATHETERIZATION    2016          without intervention          ?  HX HEENT              goiter/thyroid surgery          ?  HX THYROIDECTOMY               Family History     Family History         Problem  Relation  Age of Onset          ?  Stroke  Sister       ?  Cancer  Sister                cervical          ?  Stroke  Brother       ?  Diabetes  Other            ?  Hypertension  Other             Social History     Social History          Socioeconomic History         ?  Marital status:  SINGLE              Spouse name:  Not on file         ?  Number of children:  Not on file     ?  Years of education:  Not on file     ?  Highest education level:  Not on file       Tobacco Use         ?  Smoking status:  Former Smoker              Last attempt to quit:  1971         Years since quitting:  48.8         ?  Smokeless tobacco:  Never Used        ?  Tobacco comment: quit in 1971       Substance and Sexual Activity         ?  Alcohol use:  No     ?  Drug use:  No       Social History  Narrative          ** Merged History Encounter **                                  Allergy:     Allergies        Allergen  Reactions         ?  Ativan [Lorazepam]  Other (comments)             Made her feel crazy and chest pressure         ?  Ativan [Lorazepam]  Other (comments)             Chest fullness         ?  Other Medication  Palpitations  Some type of decongestant         ?  Other Medication  Other (comments)             Doesn't take decongestants, but can't recall why              Current Medications:     Outpatient Encounter Medications as of 07/13/2018          Medication  Sig  Dispense  Refill           ?  predniSONE (DELTASONE) 10 mg tablet  Take 10 mg by mouth daily (with breakfast). Take 1 pill once a day after breakfast.  30 Tab  0     ?  predniSONE (DELTASONE) 20 mg tablet  Take 1 pill once a day after food.  30 Tab  1     ?  folic acid 128 mcg tablet  Take 400 mcg by mouth daily.         ?  pravastatin (PRAVACHOL) 80 mg tablet  Take 1 Tab by mouth daily.  90 Tab  3     ?  lisinopril-hydroCHLOROthiazide (PRINZIDE, ZESTORETIC) 20-12.5 mg per tablet  Take 1 Tab by mouth daily.  90 Tab  3     ?  famotidine (PEPCID) 20 mg tablet  take 1 tablet by mouth once daily  90 Tab  3     ?  nitroglycerin (NITROSTAT) 0.4 mg SL tablet  Place 1 sl under the tongue q 5 min prn cp, max 3 sl in a 15-min time period. Call 911 if no relief after the 3rd sl.  1 Bottle  prn           ?  aspirin 81 mg chewable tablet  Take 1 Tab by mouth daily.  30 Tab  11          No facility-administered encounter medications on file as of 07/13/2018.                  REVIEW OF SYSTEMS: The following systems were reviewed with patient today and were negative except for the following (depicted with an "X"):                        "X"  General    "X"  Head and Neck    "X"  Heart and Breathing    "X"  Gastrointestinal       Fever/chills      Hair loss      Shortness of breath      Upset stomach     x  [place]      Dry mouth    x   Coughing      Diarrhea / constipation       Wt loss      Mouth sores      Wheezing      Heartburn       Wt gain      Ringing ears    x  Chest pain      Dark or bloody stools       Night sweats      Diff. swallowing      None of above      Nausea or vomiting       None of above    X  None of above          X  None of above                                                      "  X"  Skin    "X"  Neurology    "X"  Urinary/Gyn    "X"  Other       Easy bruising    x  Numbness/ tingling      Female problems      Depression       Rashes      Weakness      Problems with urination      Feeling anxious       Sun sensitivity      Headaches    X  None of above      Problems sleeping     X  None of above      None of above          X  None of above               Physical Exam:   Blood pressure 116/60, height 4' 11"  (1.499 m), weight 180 lb 9.6 oz (81.9 kg).   General:  Patient alert, cooperative and in no apparent distress.   HEENT: Pupils equally reactive to light and accommodation, minimal scleral injection noted.  Neck supple, no lymphadenopathy, no thyromegaly.  No temporal tenderness with no jaw pain on opening and closing of her mouth.   Heart: Regular rate and rhythm, normal S1 and S2, no rubs or gallops.   Lungs: Clear to auscultation bilaterally.   Abdomen: Soft, nontender, no hepatosplenomegaly.   Skin:  No rashes. No nail abnormalities.   Neurologic:  Oriented, normal speech and affect.  Normal gait.     Extremities:  No edema in bilateral lower extremities with no cyanosis or clubbing.      Muskoskeletal Exam:       I examined the shoulders, elbows, wrists, MCPs, PIPs, DIPs and knees bilaterally for strength, range of motion, deformity, tenderness, swelling, and synovitis.        The findings are: Noted tenderness of the IP joint of both the thumbs with tenderness on palpation of the right index finger MCP joint with no synovitis, warmth or redness.   Her ability to flex and extend the fingers is intact but she does  have a weak grip.  Does have tenderness on palpation of the shoulders both anteriorly as well as superiorly with limited abduction to greater than 90 degrees with limited internal rotation  as well.   Does have tenderness on palpation of the paraspinal muscles of the neck with some C-spine tenderness.  Does have L-spine tenderness with bilateral SI joint tenderness.  Does have tenderness on palpation of the knees worse in the right knee than the left  knee in the mid joint line with synovitis of the left knee but not the right knee with joint crepitus on flexion as well as extension of both the knees.      Patient otherwise has a normal joint exam without other evidence of joint tenderness, synovitis, warmth, erythema, decreased ROM, weakness or deformities.       Radiology Reports Reviewed (if available):  Last 3 months   No results found.      Lab Reports Reviewed (if available): Last 3 months        Lab Only on 06/01/2018           Component  Date  Value  Ref Range  Status            ?  VITAMIN D, 25-HYDROXY  06/01/2018  25.3*  30.0 - 100.0 ng/mL  Final  Comment: Vitamin D deficiency has been defined by the [place] practice guideline as a   level of serum 25-OH vitamin D less than 20 ng/mL (1,2).   The Endocrine Society went on to further define vitamin D   insufficiency as a level between 21 and 29 ng/mL (2).   1. IOM (Institute of Medicine). 2010. Dietary reference      intakes for calcium and D. [place]: The      [employer].   2. Holick MF, Binkley NC, Bischoff-Ferrari HA, et al.      Evaluation, treatment, and prevention of vitamin D      deficiency: an Endocrine Society clinical practice      guideline. JCEM. 2011 Jul; 96(7):1911-30.               ?  Cholesterol, total  06/01/2018  151   100 - 199 mg/dL  Final     ?  Triglyceride  06/01/2018  80   0 - 149 mg/dL  Final     ?  HDL Cholesterol  06/01/2018  55   >39 mg/dL  Final     ?  VLDL,  calculated  06/01/2018  16   5 - 40 mg/dL  Final     ?  LDL, calculated  06/01/2018  80   0 - 99 mg/dL  Final     ?  Hemoglobin A1c  06/01/2018  6.0*  4.8 - 5.6 %  Final          Comment:          Prediabetes: 5.7 - 6.4            Diabetes: >6.4            Glycemic control for adults with diabetes: <7.0               ?  Estimated average glucose  06/01/2018  126   mg/dL  Final     ?  Glucose  06/01/2018  89   65 - 99 mg/dL  Final     ?  BUN  06/01/2018  15   8 - 27 mg/dL  Final     ?  Creatinine  06/01/2018  1.07*  0.57 - 1.00 mg/dL  Final     ?  GFR est non-AA  06/01/2018  49*  >59 mL/min/1.73  Final     ?  GFR est AA  06/01/2018  57*  >59 mL/min/1.73  Final     ?  BUN/Creatinine ratio  06/01/2018  14   12 - 28  Final     ?  Sodium  06/01/2018  140   134 - 144 mmol/L  Final     ?  Potassium  06/01/2018  4.2   3.5 - 5.2 mmol/L  Final     ?  Chloride  06/01/2018  101   96 - 106 mmol/L  Final     ?  CO2  06/01/2018  22   20 - 29 mmol/L  Final     ?  Calcium  06/01/2018  9.2   8.7 - 10.3 mg/dL  Final     ?  Protein, total  06/01/2018  8.2   6.0 - 8.5 g/dL  Final     ?  Albumin  06/01/2018  3.8   3.5 - 4.8 g/dL  Final     ?  GLOBULIN, TOTAL  06/01/2018  4.4  1.5 - 4.5 g/dL  Final     ?  A-G Ratio  06/01/2018  0.9*  1.2 - 2.2  Final     ?  Bilirubin, total  06/01/2018  0.3   0.0 - 1.2 mg/dL  Final     ?  Alk. phosphatase  06/01/2018  60   39 - 117 IU/L  Final     ?  AST (SGOT)  06/01/2018  12   0 - 40 IU/L  Final     ?  ALT (SGPT)  06/01/2018  9   0 - 32 IU/L  Final       Hospital Outpatient Visit on 05/12/2018           Component  Date  Value  Ref Range  Status            ?  WBC  05/12/2018  8.3   4.3 - 11.1 K/uL  Final     ?  RBC  05/12/2018  3.40*  4.05 - 5.25 M/uL  Final     ?  HGB  05/12/2018  8.9*  11.7 - 15.4 g/dL  Final     ?  HCT  05/12/2018  28.4*  35.8 - 46.3 %  Final     ?  MCV  05/12/2018  83.5   79.6 - 97.8 FL  Final     ?  MCH  05/12/2018  26.2   26.1 - 32.9 PG  Final     ?  MCHC  05/12/2018  31.3*  31.4  - 35.0 g/dL  Final     ?  RDW  05/12/2018  16.5*  11.9 - 14.6 %  Final     ?  PLATELET  05/12/2018  208   150 - 450 K/uL  Final     ?  MPV  05/12/2018  9.5   9.4 - 12.3 FL  Final     ?  ABSOLUTE NRBC  05/12/2018  0.00   0.0 - 0.2 K/uL  Final          **Note: Absolute NRBC parameter is now reported with Hemogram**            ?  DF  05/12/2018  AUTOMATED      Final     ?  NEUTROPHILS  05/12/2018  72   43 - 78 %  Final     ?  LYMPHOCYTES  05/12/2018  19   13 - 44 %  Final     ?  MONOCYTES  05/12/2018  7   4.0 - 12.0 %  Final     ?  EOSINOPHILS  05/12/2018  1   0.5 - 7.8 %  Final     ?  BASOPHILS  05/12/2018  0   0.0 - 2.0 %  Final     ?  IMMATURE GRANULOCYTES  05/12/2018  1   0.0 - 5.0 %  Final     ?  ABS. NEUTROPHILS  05/12/2018  6.0   1.7 - 8.2 K/UL  Final     ?  ABS. LYMPHOCYTES  05/12/2018  1.5   0.5 - 4.6 K/UL  Final     ?  ABS. MONOCYTES  05/12/2018  0.6   0.1 - 1.3 K/UL  Final     ?  ABS. EOSINOPHILS  05/12/2018  0.1   0.0 - 0.8 K/UL  Final     ?  ABS. BASOPHILS  05/12/2018  0.0   0.0 -  0.2 K/UL  Final     ?  ABS. IMM. GRANS.  05/12/2018  0.0   0.0 - 0.5 K/UL  Final     ?  Sodium  05/12/2018  141   136 - 145 mmol/L  Final     ?  Potassium  05/12/2018  3.6   3.5 - 5.1 mmol/L  Final     ?  Chloride  05/12/2018  106   98 - 107 mmol/L  Final     ?  CO2  05/12/2018  26   21 - 32 mmol/L  Final     ?  Anion gap  05/12/2018  9   7 - 16 mmol/L  Final     ?  Glucose  05/12/2018  153*  65 - 100 mg/dL  Final     ?  BUN  05/12/2018  24*  8 - 23 MG/DL  Final     ?  Creatinine  05/12/2018  1.40*  0.6 - 1.0 MG/DL  Final     ?  GFR est AA  05/12/2018  47*  >60 ml/min/1.13m  Final     ?  GFR est non-AA  05/12/2018  39*  >60 ml/min/1.76m Final          Comment: (NOTE)   Estimated GFR is calculated using the Modification of Diet in Renal    Disease (MDRD) Study equation, reported for both African Americans    (GFRAA) and non-African Americans (GFRNA), and normalized to 1.7385m  body surface area. The physician must decide which  value applies to    the patient. The MDRD study equation should only be used in    individuals age 49 68 older. It has not been validated for the    following: pregnant women, patients with serious comorbid conditions,    or on certain medications, or persons with extremes of body size,    muscle mass, or nutritional status.               ?  Calcium  05/12/2018  9.4   8.3 - 10.4 MG/DL  Final     ?  Bilirubin, total  05/12/2018  0.4   0.2 - 1.1 MG/DL  Final     ?  ALT (SGPT)  05/12/2018  15   12 - 65 U/L  Final     ?  AST (SGOT)  05/12/2018  8*  15 - 37 U/L  Final     ?  Alk. phosphatase  05/12/2018  61   50 - 136 U/L  Final     ?  Protein, total  05/12/2018  8.9*  6.3 - 8.2 g/dL  Final     ?  Albumin  05/12/2018  2.9*  3.2 - 4.6 g/dL  Final     ?  Globulin  05/12/2018  6.0*  2.3 - 3.5 g/dL  Final     ?  A-G Ratio  05/12/2018  0.5*  1.2 - 3.5    Final     ?  Ferritin  05/12/2018  557*  8 - 388 NG/ML  Final     ?  Reticulocyte count  05/12/2018  1.6   0.3 - 2.0 %  Final     ?  Absolute Retic Cnt.  05/12/2018  0.0544   0.026 - 0.095 M/ul  Final     ?  Immature Retic Fraction  05/12/2018  14.4   3.0 - 15.9 %  Final     ?  Retic Hgb Conc.  05/12/2018  30   29 - 35 pg  Final     ?  Iron  05/12/2018  33*  35 - 150 ug/dL  Final          Comment: Known Interfering Substances section:   "Iron values may be falsely elevated in   serum samples from patients with   anticoagulants (e.g., hemodialysis patients)."   Limitations of Procedure section:   "Turbidity resulting from precipitation of   fibrinogen in the serum of patients treated   with anticoagulants (e.g. hemodialysis   patients) may cause spuriously elevated   iron results."               ?  TIBC  05/12/2018  214*  250 - 450 ug/dL  Final     ?  Transferrin Saturation  05/12/2018  15   %  Final     ?  Sed rate, automated  05/12/2018  115*  0 - 30 mm/hr  Final       Office Visit on 05/05/2018           Component  Date  Value  Ref Range  Status            ?  C-Reactive  Protein, Qt  05/05/2018  39*  0 - 10 mg/L  Final     ?  CCP Antibodies IgG/IgA  05/05/2018  7   0 - 19 units  Final          Comment:                           Negative               <20                             Weak positive      20 - 39                             Moderate positive  40 - 59                             Strong positive        >59               ?  Rheumatoid factor  05/05/2018  <10.0   0.0 - 13.9 IU/mL  Final            ?  Antinuclear Antibodies Direct  05/05/2018  Negative   Negative  Final              The results above were reviewed and discussed with patient.            Assessment/Plan:    Guelda Batson is a 77 y.o.  female who presents with:       1. PMR (polymyalgia rheumatica) ([place]): Was instructed to come down on the prednisone to 15 mg once a day starting tomorrow on and in roughly 2 weeks time I did instruct her  to decrease the prednisone dose down to 10 mg once a day.  I did counsel the patient to make sure she took the prednisone after food and not on an empty stomach.  She is also aware that she would need to  avoid any over-the-counter NSAID's while on prednisone.   -     C REACTIVE PROTEIN, QT   -     SED RATE (ESR)   -     predniSONE (DELTASONE) 10 mg tablet; Take 10 mg by mouth daily (with breakfast). Take 1 pill once a day after breakfast.      Disease activity plan:  As stated above.      Steroid management plan:  As stated above, if applicable.      Pain management plan:  As stated above, if applicable.      Weight management plan:  Weight loss through diet and exercise is always encouraged      Disease prognosis: Good            I appreciate the opportunity to continue to participate in the care of this patient.         Follow-up and Dispositions      ??  Return in about 6 weeks (around 08/24/2018).             Electronically signed by:   Jeffie Pollock, MD         This note was dictated using dragon voice recognition software.  It has been proofread, but there may  still exist voice recognition errors that the author did not detect.

## 2018-07-13 NOTE — Progress Notes (Signed)
[facility]  Jeffie Pollock, M.D.  [address], [place], [place], [place], [address]  Office : [phone], Fax: [phone]      RHEUMATOLOGY OFFICE VISIT NOTE  Date of Visit:  07/13/2018 3:07 PM    Patient Information:  Name:  Sarah Chavez  DOB:  01-27-1941  Age:  77 y.o.   Gender:  female      Sarah Chavez is here today for follow-up of PMR.      Last visit: 06/02/2018      History of Present Illness: On talking to the patient today she states that she has had a cough with white productive sputum with no fever or chills. Has not had any shortness of breath but has had occasional chest pain secondary to being in a lot of stress. This episode of her chest pain was just once.  With the chest pain she has not had any radiating pain down the left arm or associated diaphoresis.  Denies any temporal headaches with no double vision with no blurring of vision either. Has not had any jaw pain.     Since the last visit, patient is feeling "better"      Pain: 8/10  Location:  Bilateral shoulder pain with some neck stiffness with some pain with neck ROM. No headaches with no pain in the para spinal muscles. No wrist pain or swelling with no warmth and redness. Occasional lower back pain with some mid back pain. Some right knee pain with swelling with occasional buckling with no warmth and redness.   Quality:  Deep achy pain.   Modifying Factors:  End of the day the pain and stiffness is the worst.   Associated Symptoms:  AM Stiffness: 1 hour; Intermittent tingling and numbness from the right shoulder to the fingertips with no radiating pain down the right arm. Some limitations with washing her hair with no limitations with dressing and undressing. Some difficulty opening jars and buttoning and unbuttoning. Intermittent tingling and numbness of the left hand. No tingling,  numbness or pain down the legs. Some right leg weakness.   Fatigue: 3/10  MDHAQ: 1.2       Last TB screen:10+ years  TB result:negative  Current dose of steroids:none  How long on current dose of steroids:na  How long on continuous steroid therapy:na  ??  Past DMARDs, if applicable (methotrexate, plaquenil/hydroxychloroquine, sulfasalazine, Arava/leflunomide):none  ??  Past biologics, if applicable (enbrel, humira, simponi, cimzia, [place], [place], remicade, simponi aria, actemra, rituximab, Leonie Man, cosentyx):none  ??  Past NSAIDs, if applicable (motrin, aleve, naproxen, advil, ibuprofen, celebrex, voltaren/diclofenac, etc.) ibuprofen.  ??  ??  Last BMD: 2013  Past osteoporosis drugs, if applicable (fosamax, actonel, boniva, reclast, prolia, forteo):none  ??  BMI:36.48  Current exercise regimen, if ONG:EXBM  Current vitamin D dose:none  Current calcium dose:none  Fractures since last visit, if any: none  ??      The patient otherwise has no significant interval changes in health or medical history to report.     History Reviewed:    Past Medical History  Past Medical History:   Diagnosis Date   ??? Abdominal pain 07/22/2015   ??? Anemia    ??? Back pain    ??? Bursitis of shoulder    ??? CAD (coronary artery disease)    ??? Cerumen impaction    ??? Chest pain, atypical 07/22/2015   ??? Diabetes ([place])     no meds, A1c 09/28/17 6.6; avg bs  105; denies ss of hypo   ??? Diabetes mellitus out of control ([place])    ??? Endocrine disease     thyroid   ??? Fatigue    ??? GERD (gastroesophageal reflux disease)     controlled with medicaitons   ??? Hip pain, right    ??? Hyperlipidemia, mild    ??? Hypertension     managed with medication   ??? Hypertension associated with diabetes ([place]) 07/22/2015   ??? 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 Left carotid bruit    ??? Lumbago 07/22/2015   ??? Morbid obesity ([place]) 07/22/2015    BMI 35.3   ??? OSA on CPAP 07/22/2015    uses CPAP   ??? Other ill-defined conditions(799.89)     cholesterol   ??? Other ill-defined conditions(799.89)     heart cath 96   ??? Shoulder pain, acute     ??? Shoulder pain, bilateral    ??? Sinusitis, acute maxillary    ??? Unstable angina ([place]) 07/06/2015   ??? Vitamin D deficiency    ??? Weakness        Past Surgical History  Past Surgical History:   Procedure Laterality Date   ??? COLONOSCOPY N/A 03/01/2018    COLONOSCOPY/BMI 39 performed by Mazanec, Dorene Ar, MD at [facility]   ??? HX COLONOSCOPY     ??? HX HEART CATHETERIZATION  2016    without intervention   ??? HX HEENT      goiter/thyroid surgery   ??? HX THYROIDECTOMY         Family History  Family History   Problem Relation Age of Onset   ??? Stroke Sister    ??? Cancer Sister         cervical   ??? Stroke Brother    ??? Diabetes Other    ??? Hypertension Other        Social History  Social History     Socioeconomic History   ??? Marital status: SINGLE     Spouse name: Not on file   ??? Number of children: Not on file   ??? Years of education: Not on file   ??? Highest education level: Not on file   Tobacco Use   ??? Smoking status: Former Smoker     Last attempt to quit: 1971     Years since quitting: 48.8   ??? Smokeless tobacco: Never Used   ??? Tobacco comment: quit in 1971   Substance and Sexual Activity   ??? Alcohol use: No   ??? Drug use: No   Social History Narrative    ** Merged History Encounter **                    Allergy:  Allergies   Allergen Reactions   ??? Ativan [Lorazepam] Other (comments)     Made her feel crazy and chest pressure   ??? Ativan [Lorazepam] Other (comments)     Chest fullness   ??? Other Medication Palpitations     Some type of decongestant   ??? Other Medication Other (comments)     Doesn't take decongestants, but can't recall why         Current Medications:  Outpatient Encounter Medications as of 07/13/2018   Medication Sig Dispense Refill   ??? predniSONE (DELTASONE) 10 mg tablet Take 10 mg by mouth daily (with breakfast). Take 1 pill once a day after breakfast. 30 Tab 0   ??? predniSONE (DELTASONE) 20 mg tablet  Take 1 pill once a day after food. 30 Tab 1   ??? folic acid 161 mcg tablet Take 400 mcg by mouth daily.      ??? pravastatin (PRAVACHOL) 80 mg tablet Take 1 Tab by mouth daily. 90 Tab 3   ??? lisinopril-hydroCHLOROthiazide (PRINZIDE, ZESTORETIC) 20-12.5 mg per tablet Take 1 Tab by mouth daily. 90 Tab 3   ??? famotidine (PEPCID) 20 mg tablet take 1 tablet by mouth once daily 90 Tab 3   ??? nitroglycerin (NITROSTAT) 0.4 mg SL tablet Place 1 sl under the tongue q 5 min prn cp, max 3 sl in a 15-min time period. Call 911 if no relief after the 3rd sl. 1 Bottle prn   ??? aspirin 81 mg chewable tablet Take 1 Tab by mouth daily. 30 Tab 11     No facility-administered encounter medications on file as of 07/13/2018.            REVIEW OF SYSTEMS: The following systems were reviewed with patient today and were negative except for the following (depicted with an "X"):        "X" General  "X" Head and Neck  "X" Heart and Breathing  "X" Gastrointestinal    Fever/chills   Hair loss   Shortness of breath   Upset stomach   x Falls   Dry mouth  x Coughing   Diarrhea / constipation    Wt loss   Mouth sores   Wheezing   Heartburn    Wt gain   Ringing ears  x Chest pain   Dark or bloody stools    Night sweats   Diff. swallowing   None of above   Nausea or vomiting    None of above  X None of above     X None of above                "X" Skin  "X" Neurology  "X" Urinary/Gyn  "X" Other    Easy bruising  x Numbness/ tingling   Female problems   Depression    Rashes   Weakness   Problems with urination   Feeling anxious    Sun sensitivity   Headaches  X None of above   Problems sleeping   X None of above   None of above     X None of above          Physical Exam:  Blood pressure 116/60, height '4\' 11"'  (1.499 m), weight 180 lb 9.6 oz (81.9 kg).  General:  Patient alert, cooperative and in no apparent distress.  HEENT: Pupils equally reactive to light and accommodation, minimal scleral injection noted.  Neck supple, no lymphadenopathy, no thyromegaly.  No temporal tenderness with no jaw pain on opening and closing of her mouth.   Heart: Regular rate and rhythm, normal S1 and S2, no rubs or gallops.  Lungs: Clear to auscultation bilaterally.  Abdomen: Soft, nontender, no hepatosplenomegaly.  Skin:  No rashes. No nail abnormalities.  Neurologic:  Oriented, normal speech and affect.  Normal gait.    Extremities:  No edema in bilateral lower extremities with no cyanosis or clubbing.    Muskoskeletal Exam:     I examined the shoulders, elbows, wrists, MCPs, PIPs, DIPs and knees bilaterally for strength, range of motion, deformity, tenderness, swelling, and synovitis.      The findings are: Noted tenderness of the IP joint of both the thumbs with tenderness on palpation of the right index finger MCP  joint with no synovitis, warmth or redness.  Her ability to flex and extend the fingers is intact but she does have a weak grip.  Does have tenderness on palpation of the shoulders both anteriorly as well as superiorly with limited abduction to greater than 90 degrees with limited internal rotation as well.  Does have tenderness on palpation of the paraspinal muscles of the neck with some C-spine tenderness.  Does have L-spine tenderness with bilateral SI joint tenderness.  Does have tenderness on palpation of the knees worse in the right knee than the left knee in the mid joint line with synovitis of the left knee but not the right knee with joint crepitus on flexion as well as extension of both the knees.    Patient otherwise has a normal joint exam without other evidence of joint tenderness, synovitis, warmth, erythema, decreased ROM, weakness or deformities.     Radiology Reports Reviewed (if available):  Last 3 months  No results found.    Lab Reports Reviewed (if available): Last 3 months    Lab Only on 06/01/2018   Component Date Value Ref Range Status   ??? VITAMIN D, 25-HYDROXY 06/01/2018 25.3* 30.0 - 100.0 ng/mL Final    Comment: Vitamin D deficiency has been defined by the [place] practice guideline as a  level of serum 25-OH vitamin D less than 20 ng/mL (1,2).  The Endocrine Society went on to further define vitamin D  insufficiency as a level between 21 and 29 ng/mL (2).  1. IOM (Institute of Medicine). 2010. Dietary reference     intakes for calcium and D. [place]: The     [employer].  2. Holick MF, Binkley NC, Bischoff-Ferrari HA, et al.     Evaluation, treatment, and prevention of vitamin D     deficiency: an Endocrine Society clinical practice     guideline. JCEM. 2011 Jul; 96(7):1911-30.     ??? Cholesterol, total 06/01/2018 151  100 - 199 mg/dL Final   ??? Triglyceride 06/01/2018 80  0 - 149 mg/dL Final   ??? HDL Cholesterol 06/01/2018 55  >39 mg/dL Final   ??? VLDL, calculated 06/01/2018 16  5 - 40 mg/dL Final   ??? LDL, calculated 06/01/2018 80  0 - 99 mg/dL Final   ??? Hemoglobin A1c 06/01/2018 6.0* 4.8 - 5.6 % Final    Comment:          Prediabetes: 5.7 - 6.4           Diabetes: >6.4           Glycemic control for adults with diabetes: <7.0     ??? Estimated average glucose 06/01/2018 126  mg/dL Final   ??? Glucose 06/01/2018 89  65 - 99 mg/dL Final   ??? BUN 06/01/2018 15  8 - 27 mg/dL Final   ??? Creatinine 06/01/2018 1.07* 0.57 - 1.00 mg/dL Final   ??? GFR est non-AA 06/01/2018 49* >59 mL/min/1.73 Final   ??? GFR est AA 06/01/2018 57* >59 mL/min/1.73 Final   ??? BUN/Creatinine ratio 06/01/2018 14  12 - 28 Final   ??? Sodium 06/01/2018 140  134 - 144 mmol/L Final   ??? Potassium 06/01/2018 4.2  3.5 - 5.2 mmol/L Final   ??? Chloride 06/01/2018 101  96 - 106 mmol/L Final   ??? CO2 06/01/2018 22  20 - 29 mmol/L Final   ??? Calcium 06/01/2018 9.2  8.7 - 10.3 mg/dL Final   ???  Protein, total 06/01/2018 8.2  6.0 - 8.5 g/dL Final   ??? Albumin 06/01/2018 3.8  3.5 - 4.8 g/dL Final   ??? GLOBULIN, TOTAL 06/01/2018 4.4  1.5 - 4.5 g/dL Final   ??? A-G Ratio 06/01/2018 0.9* 1.2 - 2.2 Final   ??? Bilirubin, total 06/01/2018 0.3  0.0 - 1.2 mg/dL Final    ??? Alk. phosphatase 06/01/2018 60  39 - 117 IU/L Final   ??? AST (SGOT) 06/01/2018 12  0 - 40 IU/L Final   ??? ALT (SGPT) 06/01/2018 9  0 - 32 IU/L Final   Hospital Outpatient Visit on 05/12/2018   Component Date Value Ref Range Status   ??? WBC 05/12/2018 8.3  4.3 - 11.1 K/uL Final   ??? RBC 05/12/2018 3.40* 4.05 - 5.25 M/uL Final   ??? HGB 05/12/2018 8.9* 11.7 - 15.4 g/dL Final   ??? HCT 05/12/2018 28.4* 35.8 - 46.3 % Final   ??? MCV 05/12/2018 83.5  79.6 - 97.8 FL Final   ??? MCH 05/12/2018 26.2  26.1 - 32.9 PG Final   ??? MCHC 05/12/2018 31.3* 31.4 - 35.0 g/dL Final   ??? RDW 05/12/2018 16.5* 11.9 - 14.6 % Final   ??? PLATELET 05/12/2018 208  150 - 450 K/uL Final   ??? MPV 05/12/2018 9.5  9.4 - 12.3 FL Final   ??? ABSOLUTE NRBC 05/12/2018 0.00  0.0 - 0.2 K/uL Final    **Note: Absolute NRBC parameter is now reported with Hemogram**   ??? DF 05/12/2018 AUTOMATED    Final   ??? NEUTROPHILS 05/12/2018 72  43 - 78 % Final   ??? LYMPHOCYTES 05/12/2018 19  13 - 44 % Final   ??? MONOCYTES 05/12/2018 7  4.0 - 12.0 % Final   ??? EOSINOPHILS 05/12/2018 1  0.5 - 7.8 % Final   ??? BASOPHILS 05/12/2018 0  0.0 - 2.0 % Final   ??? IMMATURE GRANULOCYTES 05/12/2018 1  0.0 - 5.0 % Final   ??? ABS. NEUTROPHILS 05/12/2018 6.0  1.7 - 8.2 K/UL Final   ??? ABS. LYMPHOCYTES 05/12/2018 1.5  0.5 - 4.6 K/UL Final   ??? ABS. MONOCYTES 05/12/2018 0.6  0.1 - 1.3 K/UL Final   ??? ABS. EOSINOPHILS 05/12/2018 0.1  0.0 - 0.8 K/UL Final   ??? ABS. BASOPHILS 05/12/2018 0.0  0.0 - 0.2 K/UL Final   ??? ABS. IMM. GRANS. 05/12/2018 0.0  0.0 - 0.5 K/UL Final   ??? Sodium 05/12/2018 141  136 - 145 mmol/L Final   ??? Potassium 05/12/2018 3.6  3.5 - 5.1 mmol/L Final   ??? Chloride 05/12/2018 106  98 - 107 mmol/L Final   ??? CO2 05/12/2018 26  21 - 32 mmol/L Final   ??? Anion gap 05/12/2018 9  7 - 16 mmol/L Final   ??? Glucose 05/12/2018 153* 65 - 100 mg/dL Final   ??? BUN 05/12/2018 24* 8 - 23 MG/DL Final   ??? Creatinine 05/12/2018 1.40* 0.6 - 1.0 MG/DL Final   ??? GFR est AA 05/12/2018 47* >60 ml/min/1.51m Final    ??? GFR est non-AA 05/12/2018 39* >60 ml/min/1.752mFinal    Comment: (NOTE)  Estimated GFR is calculated using the Modification of Diet in Renal   Disease (MDRD) Study equation, reported for both African Americans   (GFRAA) and non-African Americans (GFRNA), and normalized to 1.7325m body surface area. The physician must decide which value applies to   the patient. The MDRD study equation should only be used in  individuals age 91 or older. It has not been validated for the   following: pregnant women, patients with serious comorbid conditions,   or on certain medications, or persons with extremes of body size,   muscle mass, or nutritional status.     ??? Calcium 05/12/2018 9.4  8.3 - 10.4 MG/DL Final   ??? Bilirubin, total 05/12/2018 0.4  0.2 - 1.1 MG/DL Final   ??? ALT (SGPT) 05/12/2018 15  12 - 65 U/L Final   ??? AST (SGOT) 05/12/2018 8* 15 - 37 U/L Final   ??? Alk. phosphatase 05/12/2018 61  50 - 136 U/L Final   ??? Protein, total 05/12/2018 8.9* 6.3 - 8.2 g/dL Final   ??? Albumin 05/12/2018 2.9* 3.2 - 4.6 g/dL Final   ??? Globulin 05/12/2018 6.0* 2.3 - 3.5 g/dL Final   ??? A-G Ratio 05/12/2018 0.5* 1.2 - 3.5   Final   ??? Ferritin 05/12/2018 557* 8 - 388 NG/ML Final   ??? Reticulocyte count 05/12/2018 1.6  0.3 - 2.0 % Final   ??? Absolute Retic Cnt. 05/12/2018 0.0544  0.026 - 0.095 M/ul Final   ??? Immature Retic Fraction 05/12/2018 14.4  3.0 - 15.9 % Final   ??? Retic Hgb Conc. 05/12/2018 30  29 - 35 pg Final   ??? Iron 05/12/2018 33* 35 - 150 ug/dL Final    Comment: Known Interfering Substances section:  "Iron values may be falsely elevated in  serum samples from patients with  anticoagulants (e.g., hemodialysis patients)."  Limitations of Procedure section:  "Turbidity resulting from precipitation of  fibrinogen in the serum of patients treated  with anticoagulants (e.g. hemodialysis  patients) may cause spuriously elevated  iron results."     ??? TIBC 05/12/2018 214* 250 - 450 ug/dL Final    ??? Transferrin Saturation 05/12/2018 15  % Final   ??? Sed rate, automated 05/12/2018 115* 0 - 30 mm/hr Final   Office Visit on 05/05/2018   Component Date Value Ref Range Status   ??? C-Reactive Protein, Qt 05/05/2018 39* 0 - 10 mg/L Final   ??? CCP Antibodies IgG/IgA 05/05/2018 7  0 - 19 units Final    Comment:                           Negative               <20                            Weak positive      20 - 39                            Moderate positive  40 - 59                            Strong positive        >59     ??? Rheumatoid factor 05/05/2018 <10.0  0.0 - 13.9 IU/mL Final   ??? Antinuclear Antibodies Direct 05/05/2018 Negative  Negative Final         The results above were reviewed and discussed with patient.         Assessment/Plan:   Sarah Chavez is a 77 y.o. female who presents with:     1. PMR (polymyalgia rheumatica) ([place]): Was instructed to come  down on the prednisone to 15 mg once a day starting tomorrow on and in roughly 2 weeks time I did instruct her to decrease the prednisone dose down to 10 mg once a day.  I did counsel the patient to make sure she took the prednisone after food and not on an empty stomach.  She is also aware that she would need to avoid any over-the-counter NSAID's while on prednisone.  -     C REACTIVE PROTEIN, QT  -     SED RATE (ESR)  -     predniSONE (DELTASONE) 10 mg tablet; Take 10 mg by mouth daily (with breakfast). Take 1 pill once a day after breakfast.    Disease activity plan:  As stated above.    Steroid management plan:  As stated above, if applicable.    Pain management plan:  As stated above, if applicable.    Weight management plan:  Weight loss through diet and exercise is always encouraged    Disease prognosis: Good        I appreciate the opportunity to continue to participate in the care of this patient.     Follow-up and Dispositions    ?? Return in about 6 weeks (around 08/24/2018).       Electronically signed by:  Jeffie Pollock, MD       This note was dictated using dragon voice recognition software.  It has been proofread, but there may still exist voice recognition errors that the author did not detect.

## 2018-07-14 LAB — C REACTIVE PROTEIN, QT: C-Reactive Protein, Qt: 1 mg/L (ref 0–10)

## 2018-07-14 LAB — SED RATE (ESR): Sed rate (ESR): 66 mm/hr — ABNORMAL HIGH (ref 0–40)

## 2018-07-14 LAB — C-REACTIVE PROTEIN: CRP: 1 mg/L (ref 0–10)

## 2018-07-14 LAB — SEDIMENTATION RATE: Sed Rate: 66 mm/hr — ABNORMAL HIGH (ref 0–40)

## 2018-08-10 ENCOUNTER — Encounter

## 2018-08-11 ENCOUNTER — Encounter: Attending: Hematology & Oncology | Primary: Family Medicine

## 2018-08-11 ENCOUNTER — Encounter: Primary: Family Medicine

## 2018-08-16 MED ORDER — FAMOTIDINE 20 MG TAB
20 mg | ORAL_TABLET | ORAL | 3 refills | Status: DC
Start: 2018-08-16 — End: 2019-11-20

## 2018-08-16 MED ORDER — PRAVASTATIN 80 MG TAB
80 mg | ORAL_TABLET | Freq: Every day | ORAL | 3 refills | Status: DC
Start: 2018-08-16 — End: 2019-10-18

## 2018-08-16 MED ORDER — LISINOPRIL-HYDROCHLOROTHIAZIDE 20 MG-12.5 MG TAB
ORAL_TABLET | Freq: Every day | ORAL | 3 refills | Status: DC
Start: 2018-08-16 — End: 2019-07-21

## 2018-08-16 NOTE — Telephone Encounter (Signed)
Please send pravchol lisinopril and pepcid to [facility] at [place]

## 2018-09-05 ENCOUNTER — Ambulatory Visit: Attending: Rheumatology | Primary: Family Medicine

## 2018-09-05 ENCOUNTER — Ambulatory Visit: Admit: 2018-09-05 | Discharge: 2018-09-05 | Payer: MEDICARE | Attending: Rheumatology | Primary: Family Medicine

## 2018-09-05 DIAGNOSIS — M353 Polymyalgia rheumatica: Secondary | ICD-10-CM

## 2018-09-05 MED ORDER — PREDNISONE 5 MG TAB
5 mg | ORAL_TABLET | ORAL | 2 refills | Status: DC
Start: 2018-09-05 — End: 2018-10-18

## 2018-09-05 MED ORDER — PREDNISONE 1 MG TAB
1 mg | ORAL_TABLET | ORAL | 1 refills | Status: DC
Start: 2018-09-05 — End: 2018-10-18

## 2018-09-05 NOTE — Progress Notes (Signed)
[facility]  Jeffie Pollock, M.D.  [address] [place], [place], [place], [address]  Office : [phone], Fax: [phone]      RHEUMATOLOGY OFFICE VISIT NOTE  Date of Visit:  09/05/2018 2:53 PM    Patient Information:  Name:  Sarah Chavez  DOB:  Oct 09, 1940  Age:  77 y.o.   Gender:  female      Sarah Chavez is here today for follow-up of PMR.      Last visit: 07/13/2018      History of Present Illness: On talking to the patient today she states that she has been on prednisone 10 mg once a day for the last 2 weeks. Has not had any jaw pain with no double vision but occasional blurring of vision though.  She has otherwise tolerated the prednisone with no side effects such as reflux or abdominal pain.  Her current joint complaints are as mentioned below.    Since the last visit, patient is feeling "fair".      Pain: 8/10  Location:  Bilateral shoulder and occasional right knee pain. Occasional swelling of the right knee with occasional buckling of the right knee as well. Some right para spinal muscle pain more than the left para spinal muscle pain. Some neck stiffness with some pain with neck ROM. No headaches. Occasional lower back pain. Bilateral ankle pain with swelling worse in the right ankle than the left ankle.   Quality:  Deep achy pain.   Modifying Factors:  Sitting for a while worsens the right knee pain. Laying on either side worsens the shoulder pain.   Associated Symptoms:  AM Stiffness: 1 hour; Intermittent tingling and numbness from the knees to the toes bilaterally. No radiating pain down the LE's. Intermittent pain from the right shoulder to the proximal forearm with intermittent pain from the left shoulder to the elbow. No tingling and numbness down the arms. Some difficulty opening jars and buttoning and unbuttoning.   Fatigue: 9/10  MDHAQ: 1.3    Last TB screen:10+ years  TB result:negative    Current dose of steroids:prednisone 10 mg    How long on current dose of steroids: 1-2  Months per pt   How long on continuous steroid therapy:3 - 4  months  ??  Past DMARDs, if applicable (methotrexate, plaquenil/hydroxychloroquine, sulfasalazine, Arava/leflunomide):none  ??  Past biologics,??if applicable??(enbrel, humira, simponi, cimzia, [place], [place], remicade, simponi aria, actemra, rituximab, Leonie Man, cosentyx):none  ??  Past NSAIDs, if applicable (motrin, aleve, naproxen, advil, ibuprofen, celebrex, voltaren/diclofenac, etc.)??ibuprofen.  ??  ??  Last BMD:??2013  Past osteoporosis drugs, if applicable (fosamax, actonel, boniva, reclast, prolia, forteo):none  ??  BMI:38.01    Current exercise regimen, if IHK:VQQV  Current vitamin D dose:none  Current calcium dose:none  Fractures since last visit, if any:??none      The patient otherwise has no significant interval changes in health or medical history to report.     History Reviewed:    Past Medical History  Past Medical History:   Diagnosis Date   ??? Abdominal pain 07/22/2015   ??? Anemia    ??? Back pain    ??? Bursitis of shoulder    ??? CAD (coronary artery disease)    ??? Cerumen impaction    ??? Chest pain, atypical 07/22/2015   ??? Diabetes ([place])     no meds, A1c 09/28/17 6.6; avg bs 105; denies ss of hypo   ??? Diabetes mellitus out of control ([place])    ???  Endocrine disease     thyroid   ??? Fatigue    ??? GERD (gastroesophageal reflux disease)     controlled with medicaitons   ??? Hip pain, right    ??? Hyperlipidemia, mild    ??? Hypertension     managed with medication   ??? Hypertension associated with diabetes ([place]) 07/22/2015   ??? 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 Left carotid bruit    ??? Lumbago 07/22/2015   ??? Morbid obesity ([place]) 07/22/2015    BMI 35.3   ??? OSA on CPAP 07/22/2015    uses CPAP   ??? Other ill-defined conditions(799.89)     cholesterol   ??? Other ill-defined conditions(799.89)     heart cath 96   ??? Shoulder pain, acute    ??? Shoulder pain, bilateral    ??? Sinusitis, acute maxillary     ??? Unstable angina ([place]) 07/06/2015   ??? Vitamin D deficiency    ??? Weakness        Past Surgical History  Past Surgical History:   Procedure Laterality Date   ??? COLONOSCOPY N/A 03/01/2018    COLONOSCOPY/BMI 39 performed by Mazanec, Dorene Ar, MD at [facility] ENDOSCOPY   ??? HX COLONOSCOPY     ??? HX HEART CATHETERIZATION  2016    without intervention   ??? HX HEENT      goiter/thyroid surgery   ??? HX THYROIDECTOMY         Family History  Family History   Problem Relation Age of Onset   ??? Stroke Sister    ??? Cancer Sister         cervical   ??? Stroke Brother    ??? Diabetes Other    ??? Hypertension Other        Social History  Social History     Socioeconomic History   ??? Marital status: SINGLE     Spouse name: Not on file   ??? Number of children: Not on file   ??? Years of education: Not on file   ??? Highest education level: Not on file   Tobacco Use   ??? Smoking status: Former Smoker     Last attempt to quit: 1971     Years since quitting: 49.0   ??? Smokeless tobacco: Never Used   ??? Tobacco comment: quit in 1971   Substance and Sexual Activity   ??? Alcohol use: No   ??? Drug use: No   Social History Narrative    ** Merged History Encounter **                    Allergy:  Allergies   Allergen Reactions   ??? Ativan [Lorazepam] Other (comments)     Made her feel crazy and chest pressure   ??? Ativan [Lorazepam] Other (comments)     Chest fullness   ??? Other Medication Palpitations     Some type of decongestant   ??? Other Medication Other (comments)     Doesn't take decongestants, but can't recall why         Current Medications:  Outpatient Encounter Medications as of 09/05/2018   Medication Sig Dispense Refill   ??? predniSONE (DELTASONE) 5 mg tablet Take 1 pill once a day after breakfast. 30 Tab 2   ??? predniSONE (DELTASONE) 1 mg tablet Take as directed. 120 Tab 1   ??? lisinopril-hydroCHLOROthiazide (PRINZIDE, ZESTORETIC) 20-12.5 mg per tablet Take 1 Tab by mouth daily. 90 Tab 3   ???  famotidine (PEPCID) 20 mg tablet take 1 tablet by mouth once daily 90  Tab 3   ??? folic acid 329 mcg tablet Take 400 mcg by mouth daily.     ??? nitroglycerin (NITROSTAT) 0.4 mg SL tablet Place 1 sl under the tongue q 5 min prn cp, max 3 sl in a 15-min time period. Call 911 if no relief after the 3rd sl. 1 Bottle prn   ??? aspirin 81 mg chewable tablet Take 1 Tab by mouth daily. 30 Tab 11   ??? pravastatin (PRAVACHOL) 80 mg tablet Take 1 Tab by mouth daily. 90 Tab 3   ??? [DISCONTINUED] predniSONE (DELTASONE) 10 mg tablet Take 10 mg by mouth daily (with breakfast). Take 1 pill once a day after breakfast. 30 Tab 0   ??? [DISCONTINUED] predniSONE (DELTASONE) 20 mg tablet Take 1 pill once a day after food. 30 Tab 1     No facility-administered encounter medications on file as of 09/05/2018.            REVIEW OF SYSTEMS: The following systems were reviewed with patient today and were negative except for the following (depicted with an "X"):        "X" General  "X" Head and Neck  "X" Heart and Breathing  "X" Gastrointestinal    Fever/chills   Hair loss  x Shortness of breath  x Upset stomach    Falls   Dry mouth  x Coughing   Diarrhea / constipation    Wt loss   Mouth sores  x Wheezing   Heartburn    Wt gain   Ringing ears   Chest pain   Dark or bloody stools    Night sweats  x Diff. swallowing   None of above  x Nausea or vomiting   X None of above   None of above      None of above                "X" Skin  "X" Neurology  "X" Urinary/Gyn  "X" Other    Easy bruising   Numbness/ tingling   Female problems   Depression    Rashes   Weakness   Problems with urination   Feeling anxious    Sun sensitivity  x Headaches  X None of above  x Problems sleeping   X None of above   None of above      None of above          Physical Exam:  Blood pressure 144/80, height '4\' 11"'$  (1.499 m), weight 188 lb 3.2 oz (85.4 kg).  General:  Patient alert, cooperative and in no apparent distress.  HEENT: Pupils equal reactive to light and accommodation, minimal scleral  injection noted.  Neck supple, no lymphadenopathy, no thyromegaly.  Heart: Regular rate and rhythm, normal S1 and S2, no rubs or gallops.  Lungs: Clear to auscultation bilaterally.  Abdomen: Soft, nontender, no hepatosplenomegaly.  Skin:  No rashes. No nail abnormalities.  Neurologic:  Oriented, normal speech and affect.  Normal gait.    Extremities:  No edema in bilateral lower extremities with no cyanosis or clubbing.    Muskoskeletal Exam:     I examined the shoulders, elbows, wrists, MCPs, PIPs, DIPs and knees bilaterally for strength, range of motion, deformity, tenderness, swelling, and synovitis.      The findings are: Noted tenderness of the right index and little finger MCP joints with no synovitis, warmth or redness.  Noted tenderness  of the left 2nd-5th PIP joints with no synovitis, warmth or redness.  Her ability to flex and extend the fingers in both hands is intact.  Does have tenderness on palpation of the shoulders both anteriorly as well as superiorly with intact range of motion to abduction as well as internal rotation of the shoulders.  Does have tenderness on palpation of C-spine as well as the paraspinal muscles of the neck with tenderness on palpation of the L-spine with bilateral SI joint tenderness.  Does have tenderness with internal as well as external rotation of the right hip in the groin.  Does have tenderness on palpation of the right knee in the mid joint line with trace synovitis but no overlying warmth or redness.  Does have tenderness on palpation of both the ankles laterally with trace synovitis but no overlying warmth or redness.  Does have metatarsalgia in both feet with synovitis involving the dorsum of the right foot but not the left foot with no tenderness, warmth or redness involving the right or left 1st-5th MTP joints.    Patient otherwise has a normal joint exam without other evidence of joint tenderness, synovitis, warmth, erythema, decreased ROM, weakness or  deformities.         Radiology Reports Reviewed (if available):  Last 3 months  No results found.    Lab Reports Reviewed (if available): Last 3 months    Office Visit on 07/13/2018   Component Date Value Ref Range Status   ??? C-Reactive Protein, Qt 07/13/2018 1  0 - 10 mg/L Final   ??? Sed rate (ESR) 07/13/2018 66* 0 - 40 mm/hr Final         The results above were reviewed and discussed with patient.         Assessment/Plan:   Jetaun Colbath is a 77 y.o. female who presents with:     1. PMR (polymyalgia rheumatica) ([place]): Was instructed to decrease the dosage of prednisone from 10 mg daily to 9 mg daily for 2 weeks starting 4 days from now.  2 weeks after being on the 9 mg daily dosage of prednisone she would come down to 8 mg daily dosage of prednisone for the following 2 weeks and then come down to 7 mg dosage of prednisone daily, the dose that she will remain on until her follow-up visit with me.  If she does have recurrence of temporal headaches or jaw pain with shoulder or hip girdle stiffness and pain she was instructed to call and keep me informed.  She was given written instructions on how to take the prednisone.  -     predniSONE (DELTASONE) 5 mg tablet; Take 1 pill once a day after breakfast.  -     predniSONE (DELTASONE) 1 mg tablet; Take as directed.  -     C REACTIVE PROTEIN, QT  -     SED RATE (ESR)      Disease activity plan:  As stated above.    Steroid management plan:  As stated above, if applicable.    Pain management plan:  As stated above, if applicable.    Weight management plan:  Weight loss through diet and exercise is always encouraged    Disease prognosis: Good        I appreciate the opportunity to continue to participate in the care of this patient.     Follow-up and Dispositions    ?? Return in about 6 weeks (around 10/17/2018).  Electronically signed by:  Jeffie Pollock, MD      This note was dictated using dragon voice recognition software.  It has  been proofread, but there may still exist voice recognition errors that the author did not detect.

## 2018-09-05 NOTE — Progress Notes (Signed)
Progress Notes by Ethlyn Daniels, MD at 09/05/18 1500                Author: Ethlyn Daniels, MD  Service: --  Author Type: Physician       Filed: 09/05/18 1603  Encounter Date: 09/05/2018  Status: Signed          Editor: Ethlyn Daniels, MD (Physician)               [facility]   Jeffie Pollock, M.D.   [address], [place] [place], [place], [place], [address]   Office : [phone], Fax: [phone]         RHEUMATOLOGY OFFICE VISIT NOTE   Date of Visit:  09/05/2018 2:53  PM      Patient Information:   Name:  Sarah Chavez   DOB:  June 08, 1941   Age:  77 y.o.    Gender:  female         Ms. Mckissic is here today for  follow-up of PMR.         Last visit: 07/13/2018         History of Present Illness: On talking to the patient today she states that she has been on prednisone 10 mg once a day for the last 2 weeks. Has not had any jaw pain with no double vision but  occasional blurring of vision though.  She has otherwise tolerated the prednisone with no side effects such as reflux or abdominal pain.  Her current joint complaints are as mentioned below.      Since the last visit, patient is feeling "fair".         Pain: 8/10   Location:  Bilateral shoulder and occasional right knee pain. Occasional swelling of the right knee with occasional buckling of the right knee as well. Some right para spinal muscle pain more than the left para spinal muscle pain. Some neck stiffness  with some pain with neck ROM. No headaches. Occasional lower back pain. Bilateral ankle pain with swelling worse in the right ankle than the left ankle.    Quality:  Deep achy pain.    Modifying Factors:  Sitting for a while worsens the right knee pain. Laying on either side worsens the shoulder pain.    Associated Symptoms:  AM Stiffness: 1 hour; Intermittent tingling and numbness from the knees to the toes bilaterally. No radiating pain down the LE's. Intermittent pain from the right  shoulder to the proximal forearm with intermittent pain from the left  shoulder to the elbow. No tingling and numbness down the arms. Some difficulty opening jars and buttoning and unbuttoning.    Fatigue: 9/10   MDHAQ: 1.3      Last TB screen:10+ years   TB result:negative      Current dose of steroids:prednisone 10 mg    How long on current dose of steroids: 1-2  Months per pt    How long on continuous steroid therapy:3 - 4  months   ??   Past DMARDs, if applicable (methotrexate, plaquenil/hydroxychloroquine, sulfasalazine, Arava/leflunomide):none   ??   Past biologics,??if applicable ??(enbrel, humira, simponi, cimzia, Satartia, Helen, remicade, simponi aria, actemra, rituximab, Leonie Man, cosentyx):none   ??   Past NSAIDs, if applicable (motrin, aleve, naproxen, advil, ibuprofen, celebrex, voltaren/diclofenac, etc.)??ibuprofen.   ??   ??   Last BMD:??2013   Past osteoporosis drugs, if applicable (fosamax, actonel, boniva, reclast, prolia, forteo):none   ??  BMI:38.01      Current exercise regimen, if ZOX:WRUE   Current vitamin D dose:none   Current calcium dose:none   Fractures since last visit, if any:??none         The patient otherwise has no significant interval changes in health or medical history to report.       History Reviewed:      Past Medical History     Past Medical History:        Diagnosis  Date         ?  Abdominal pain  07/22/2015     ?  Anemia       ?  Back pain       ?  Bursitis of shoulder       ?  CAD (coronary artery disease)       ?  Cerumen impaction       ?  Chest pain, atypical  07/22/2015     ?  Diabetes ([place])            no meds, A1c 09/28/17 6.6; avg bs 105; denies ss of hypo         ?  Diabetes mellitus out of control [facility])       ?  Endocrine disease            thyroid         ?  Fatigue       ?  GERD (gastroesophageal reflux disease)            controlled with medicaitons         ?  Hip pain, right       ?  Hyperlipidemia, mild       ?  Hypertension            managed with medication          ?  Hypertension associated with diabetes ([place])  07/22/2015     ?  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  Left carotid bruit       ?  Lumbago  07/22/2015     ?  Morbid obesity ([place])  07/22/2015          BMI 35.3         ?  OSA on CPAP  07/22/2015          uses CPAP         ?  Other ill-defined conditions(799.89)            cholesterol         ?  Other ill-defined conditions(799.89)            heart cath 96         ?  Shoulder pain, acute       ?  Shoulder pain, bilateral       ?  Sinusitis, acute maxillary       ?  Unstable angina ([place])  07/06/2015     ?  Vitamin D deficiency           ?  Weakness             Past Surgical History     Past Surgical History:         Procedure  Laterality  Date          ?  COLONOSCOPY  N/A  03/01/2018          COLONOSCOPY/BMI 39 performed by  Mazanec, Dorene Ar, MD at [facility] ENDOSCOPY          ?  HX COLONOSCOPY         ?  HX HEART CATHETERIZATION    2016          without intervention          ?  HX HEENT              goiter/thyroid surgery          ?  HX THYROIDECTOMY               Family History     Family History         Problem  Relation  Age of Onset          ?  Stroke  Sister       ?  Cancer  Sister                cervical          ?  Stroke  Brother       ?  Diabetes  Other            ?  Hypertension  Other             Social History     Social History          Socioeconomic History         ?  Marital status:  SINGLE              Spouse name:  Not on file         ?  Number of children:  Not on file     ?  Years of education:  Not on file     ?  Highest education level:  Not on file       Tobacco Use         ?  Smoking status:  Former Smoker              Last attempt to quit:  1971         Years since quitting:  49.0         ?  Smokeless tobacco:  Never Used        ?  Tobacco comment: quit in 1971       Substance and Sexual Activity         ?  Alcohol use:  No     ?  Drug use:  No       Social History Narrative          ** Merged History Encounter **                                   Allergy:     Allergies        Allergen  Reactions         ?  Ativan [Lorazepam]  Other (comments)             Made her feel crazy and chest pressure         ?  Ativan [Lorazepam]  Other (comments)             Chest fullness         ?  Other Medication  Palpitations             Some type of decongestant         ?  Other Medication  Other (comments)             Doesn't take decongestants, but can't recall why              Current Medications:     Outpatient Encounter Medications as of 09/05/2018          Medication  Sig  Dispense  Refill           ?  predniSONE (DELTASONE) 5 mg tablet  Take 1 pill once a day after breakfast.  30 Tab  2     ?  predniSONE (DELTASONE) 1 mg tablet  Take as directed.  120 Tab  1     ?  lisinopril-hydroCHLOROthiazide (PRINZIDE, ZESTORETIC) 20-12.5 mg per tablet  Take 1 Tab by mouth daily.  90 Tab  3     ?  famotidine (PEPCID) 20 mg tablet  take 1 tablet by mouth once daily  90 Tab  3     ?  folic acid 098 mcg tablet  Take 400 mcg by mouth daily.         ?  nitroglycerin (NITROSTAT) 0.4 mg SL tablet  Place 1 sl under the tongue q 5 min prn cp, max 3 sl in a 15-min time period. Call 911 if no relief after the 3rd sl.  1 Bottle  prn     ?  aspirin 81 mg chewable tablet  Take 1 Tab by mouth daily.  30 Tab  11     ?  pravastatin (PRAVACHOL) 80 mg tablet  Take 1 Tab by mouth daily.  90 Tab  3     ?  [DISCONTINUED] predniSONE (DELTASONE) 10 mg tablet  Take 10 mg by mouth daily (with breakfast). Take 1 pill once a day after breakfast.  30 Tab  0           ?  [DISCONTINUED] predniSONE (DELTASONE) 20 mg tablet  Take 1 pill once a day after food.  30 Tab  1          No facility-administered encounter medications on file as of 09/05/2018.                  REVIEW OF SYSTEMS: The following systems were reviewed with patient today and were negative except for the following (depicted with an "X"):                        "X"  General    "X"  Head and Neck    "X"  Heart and  Breathing    "X"  Gastrointestinal       Fever/chills      Hair loss    x  Shortness of breath    x  Upset stomach       Falls      Dry mouth    x  Coughing      Diarrhea / constipation       Wt loss      Mouth sores    x  Wheezing      Heartburn       Wt gain      Ringing ears      Chest pain      Dark or bloody stools       Night sweats    x  Diff. swallowing      None of above    x  Nausea or vomiting  X  None of above      None of above            None of above                                                      "X"  Skin    "X"  Neurology    "X"  Urinary/Gyn    "X"  Other       Easy bruising      Numbness/ tingling      Female problems      Depression       Rashes      Weakness      Problems with urination      Feeling anxious       Sun sensitivity    x  Headaches    X  None of above    x  Problems sleeping     X  None of above      None of above            None of above               Physical Exam:   Blood pressure 144/80, height _0  (1.499 m), weight 188 lb 3.2 oz (85.4 kg).   General:  Patient alert, cooperative and in no apparent distress.   HEENT: Pupils equal reactive to light and accommodation, minimal scleral injection noted.  Neck supple, no lymphadenopathy, no thyromegaly.   Heart: Regular rate and rhythm, normal S1 and S2, no rubs or gallops.   Lungs: Clear to auscultation bilaterally.   Abdomen: Soft, nontender, no hepatosplenomegaly.   Skin:  No rashes. No nail abnormalities.   Neurologic:  Oriented, normal speech and affect.  Normal gait.     Extremities:  No edema in bilateral lower extremities with no cyanosis or clubbing.      Muskoskeletal Exam:       I examined the shoulders, elbows, wrists, MCPs, PIPs, DIPs and knees bilaterally for strength, range of motion, deformity, tenderness, swelling, and synovitis.        The findings are: Noted tenderness of the right index and little finger MCP joints with no synovitis, warmth or redness.  Noted tenderness of the left 2nd-5th PIP joints with   no synovitis, warmth or redness.  Her ability to flex and extend the fingers in both hands is intact.  Does have tenderness on palpation of the shoulders both anteriorly as well as superiorly with intact range of motion to abduction as well as internal  rotation of the shoulders.   Does have tenderness on palpation of C-spine as well as the paraspinal muscles of the neck with tenderness on palpation of the L-spine with bilateral SI joint tenderness.  Does have tenderness with internal as well as external rotation of the right hip  in the groin.  Does have tenderness on palpation of the right knee in the mid joint line with trace synovitis but no overlying warmth or redness.  Does have tenderness on palpation of both the ankles laterally with trace synovitis but no overlying warmth  or redness.  Does have metatarsalgia in both feet with synovitis involving the dorsum of the right foot but not the left foot with no tenderness, warmth or redness involving the right or left  1st-5th MTP joints.      Patient otherwise has a normal joint exam without other evidence of joint tenderness, synovitis, warmth, erythema, decreased ROM, weakness or deformities.             Radiology Reports Reviewed (if available):  Last 3 months   No results found.      Lab Reports Reviewed (if available): Last 3 months        Office Visit on 07/13/2018           Component  Date  Value  Ref Range  Status            ?  C-Reactive Protein, Qt  07/13/2018  1   0 - 10 mg/L  Final            ?  Sed rate (ESR)  07/13/2018  66*  0 - 40 mm/hr  Final              The results above were reviewed and discussed with patient.            Assessment/Plan:    Caia Lofaro is a 77 y.o.  female who presents with:       1. PMR (polymyalgia rheumatica) ([place]): Was instructed to decrease the dosage of prednisone from 10 mg daily to 9 mg daily for 2 weeks starting 4 days from now.  2 weeks after  being on the 9 mg daily dosage of prednisone she would come  down to 8 mg daily dosage of prednisone for the following 2 weeks and then come down to 7 mg dosage of prednisone daily, the dose that she will remain on until her follow-up visit with me.  If  she does have recurrence of temporal headaches or jaw pain with shoulder or hip girdle stiffness and pain she was instructed to call and keep me informed.  She was given written instructions on how to take the prednisone.   -     predniSONE (DELTASONE) 5 mg tablet; Take 1 pill once a day after breakfast.   -     predniSONE (DELTASONE) 1 mg tablet; Take as directed.   -     C REACTIVE PROTEIN, QT   -     SED RATE (ESR)         Disease activity plan:  As stated above.      Steroid management plan:  As stated above, if applicable.      Pain management plan:  As stated above, if applicable.      Weight management plan:  Weight loss through diet and exercise is always encouraged      Disease prognosis: Good            I appreciate the opportunity to continue to participate in the care of this patient.         Follow-up and Dispositions      ??  Return in about 6 weeks (around 10/17/2018).             Electronically signed by:   Jeffie Pollock, MD         This note was dictated using dragon voice recognition software.  It has been proofread, but there may still exist voice recognition errors that the author did not detect.

## 2018-09-06 LAB — C REACTIVE PROTEIN, QT: C-Reactive Protein, Qt: 1 mg/L (ref 0–10)

## 2018-09-06 LAB — SED RATE (ESR): Sed rate (ESR): 63 mm/hr — ABNORMAL HIGH (ref 0–40)

## 2018-09-06 LAB — C-REACTIVE PROTEIN: CRP: 1 mg/L (ref 0–10)

## 2018-09-06 LAB — SEDIMENTATION RATE: Sed Rate: 63 mm/hr — ABNORMAL HIGH (ref 0–40)

## 2018-10-12 ENCOUNTER — Ambulatory Visit: Attending: Family Medicine | Primary: Family Medicine

## 2018-10-12 ENCOUNTER — Ambulatory Visit: Admit: 2018-10-12 | Discharge: 2018-10-12 | Payer: MEDICARE | Attending: Family Medicine | Primary: Family Medicine

## 2018-10-12 DIAGNOSIS — Z0000 Encounter for general adult medical examination without abnormal findings: Secondary | ICD-10-CM

## 2018-10-12 LAB — AMB POC COMPLETE CBC,AUTOMATED ENTER
ABS. GRANS (POC): 6.4 10*3/uL (ref 2.0–7.8)
ABS. LYMPHS (POC): 1.7 10*3/uL (ref 0.6–4.1)
GRANULOCYTES (POC): 74.3 % (ref 37.0–92.0)
Granulocytes %, POC: 74.3 % (ref 37.0–92.0)
Granulocytes Abs: 6.4 10*3/uL (ref 2.0–7.8)
HCT (POC): 41.4 % (ref 37.0–51.0)
HGB (POC): 13.1 g/dL (ref 12.0–18.0)
Hematocrit, POC: 41.4 % (ref 37.0–51.0)
Hemoglobin, POC: 13.1 g/dL (ref 12.0–18.0)
LYMPHOCYTES (POC): 20.3 % (ref 10.0–58.5)
Lymphocyte %: 20.3 % (ref 10.0–58.5)
Lymphs Abs: 1.7 10*3/uL (ref 0.6–4.1)
MCH (POC): 28.4 pg (ref 26.0–32.0)
MCH: 28.4 pg (ref 26.0–32.0)
MCHC (POC): 31.6 g/dL (ref 31.0–36.0)
MCHC: 31.6 g/dL (ref 31.0–36.0)
MCV (POC): 89.7 fL (ref 80.0–97.0)
MCV: 89.7 fL (ref 80.0–97.0)
MID% POC: 5.4 % (ref 0.1–24.0)
MPV (POC): 7.3 fL (ref 0.0–49.9)
MPV POC: 7.3 fL (ref 0.0–49.9)
Mid # (POC): 0.5 10*3/uL (ref 0.0–1.8)
Mid Cells %, POC: 5.4 % (ref 0.1–24.0)
Mid Cells Absoulute POC: 0.5 10*3/uL (ref 0.0–1.8)
PLATELET (POC): 203 10*3/uL (ref 140–440)
Platelet Count, POC: 203 10*3/uL (ref 140–440)
RBC (POC): 4.62 10*6/uL (ref 4.20–6.30)
RBC, POC: 4.62 10*6/uL (ref 4.20–6.30)
RDW (POC): 12.5 % (ref 11.5–14.5)
RDW, POC: 12.5 % (ref 11.5–14.5)
WBC (POC): 8.6 10*3/uL (ref 4.1–10.9)
WBC, POC: 8.6 10*3/uL (ref 4.1–10.9)

## 2018-10-12 LAB — AMB POC URINE, MICROALBUMIN, SEMIQUANTITATIVE
Microalbumin urine (POC): 20 MG/L
Microalbumin urine, POC: 20 MG/L

## 2018-10-12 NOTE — ACP (Advance Care Planning) (Signed)
Advanced care planning discussed with the patient including the importance of living will and power of attorney.  Patient reports both documents need to be created and will discuss with family members to get completed.  Patient understands the importance of these documents.

## 2018-10-12 NOTE — Addendum Note (Signed)
Addended by: Delana Meyer on: 10/20/2018 01:59 PM     Modules accepted: Level of Service

## 2018-10-12 NOTE — Progress Notes (Signed)
SUBJECTIVE:   Sarah Chavez is a 78 y.o. female who reports that she is noticed a discoloration of her skin on her left lower calf.  It does not itch or hurt.  She is not sure how long it is been this way.  She thinks is been at least several months.  No new soaps, detergents or fabric softeners are reported.  Patient has been on prednisone chronically for polymyalgia rheumatica.  She reports that this is doing relatively well and her PMR seems to be doing okay.  She reports no visual disturbance or loss.  She reports no headaches.  She is followed closely by rheumatology.  In addition the patient has a past medical history significant for normocytic anemia felt to be anemia of chronic disease, diabetes, hypertension, high cholesterol, CAD, vitamin D deficiency and obesity.  Patient denies chest pain, shortness of breath, orthopnea or PND.  GI and GU review of systems is unremarkable.    HPI  See above    Past Medical History, Past Surgical History, Family history, Social History, and Medications were all reviewed with the patient today and updated as necessary.       Current Outpatient Medications   Medication Sig Dispense Refill   ??? amoxicillin-clavulanate (AUGMENTIN) 875-125 mg per tablet TK 1 T PO BID FOR 10 DAYS     ??? predniSONE (DELTASONE) 5 mg tablet Take 1 pill once a day after breakfast. 30 Tab 2   ??? predniSONE (DELTASONE) 1 mg tablet Take as directed. 120 Tab 1   ??? pravastatin (PRAVACHOL) 80 mg tablet Take 1 Tab by mouth daily. 90 Tab 3   ??? lisinopril-hydroCHLOROthiazide (PRINZIDE, ZESTORETIC) 20-12.5 mg per tablet Take 1 Tab by mouth daily. 90 Tab 3   ??? famotidine (PEPCID) 20 mg tablet take 1 tablet by mouth once daily 90 Tab 3   ??? folic acid 400 mcg tablet Take 400 mcg by mouth daily.     ??? nitroglycerin (NITROSTAT) 0.4 mg SL tablet Place 1 sl under the tongue q 5 min prn cp, max 3 sl in a 15-min time period. Call 911 if no relief after the 3rd sl. 1 Bottle prn    ??? aspirin 81 mg chewable tablet Take 1 Tab by mouth daily. 30 Tab 11     Allergies   Allergen Reactions   ??? Ativan [Lorazepam] Other (comments)     Made her feel crazy and chest pressure   ??? Ativan [Lorazepam] Other (comments)     Chest fullness   ??? Other Medication Palpitations     Some type of decongestant   ??? Other Medication Other (comments)     Doesn't take decongestants, but can't recall why     Patient Active Problem List   Diagnosis Code   ??? HTN (hypertension) I10   ??? Diabetes mellitus type 2, controlled (HCC) E11.9   ??? Coronary artery disease involving native coronary artery of native heart without angina pectoris I25.10   ??? Dyslipidemia E78.5   ??? OSA on CPAP G47.33, Z99.89   ??? Severe obesity (HCC) E66.01   ??? Elevated sed rate R70.0   ??? Normocytic anemia D64.9     Past Medical History:   Diagnosis Date   ??? Abdominal pain 07/22/2015   ??? Anemia    ??? Back pain    ??? Bursitis of shoulder    ??? CAD (coronary artery disease)    ??? Cerumen impaction    ??? Chest pain, atypical 07/22/2015   ???  Diabetes (HCC)     no meds, A1c 09/28/17 6.6; avg bs 105; denies ss of hypo   ??? Diabetes mellitus out of control (HCC)    ??? Endocrine disease     thyroid   ??? Fatigue    ??? GERD (gastroesophageal reflux disease)     controlled with medicaitons   ??? Hip pain, right    ??? Hyperlipidemia, mild    ??? Hypertension     managed with medication   ??? Hypertension associated with diabetes (HCC) 07/22/2015   ??? 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 Left carotid bruit    ??? Lumbago 07/22/2015   ??? Morbid obesity (HCC) 07/22/2015    BMI 35.3   ??? OSA on CPAP 07/22/2015    uses CPAP   ??? Other ill-defined conditions(799.89)     cholesterol   ??? Other ill-defined conditions(799.89)     heart cath 96   ??? Shoulder pain, acute    ??? Shoulder pain, bilateral    ??? Sinusitis, acute maxillary    ??? Unstable angina (HCC) 07/06/2015   ??? Vitamin D deficiency    ??? Weakness      Past Surgical History:   Procedure Laterality Date    ??? COLONOSCOPY N/A 03/01/2018    COLONOSCOPY/BMI 39 performed by Mazanec, Worthy Rancher, MD at [facility] ENDOSCOPY   ??? HX COLONOSCOPY     ??? HX HEART CATHETERIZATION  2016    without intervention   ??? HX HEENT      goiter/thyroid surgery   ??? HX THYROIDECTOMY       Family History   Problem Relation Age of Onset   ??? Stroke Sister    ??? Cancer Sister         cervical   ??? Stroke Brother    ??? Diabetes Other    ??? Hypertension Other      Social History     Tobacco Use   ??? Smoking status: Former Smoker     Last attempt to quit: 1971     Years since quitting: 49.1   ??? Smokeless tobacco: Never Used   ??? Tobacco comment: quit in 1971   Substance Use Topics   ??? Alcohol use: No         Review of Systems  See above    OBJECTIVE:  Visit Vitals  BP 128/80   Ht 4\' 11"  (1.499 m)   Wt 190 lb (86.2 kg)   BMI 38.38 kg/m??        Physical Exam  Constitutional:       Appearance: She is well-developed. She is obese.   HENT:      Head: Normocephalic and atraumatic.      Right Ear: External ear normal.      Left Ear: External ear normal.      Nose: Nose normal.      Mouth/Throat:      Pharynx: No oropharyngeal exudate.   Eyes:      General: No scleral icterus.        Right eye: No discharge.         Left eye: No discharge.      Pupils: Pupils are equal, round, and reactive to light.   Neck:      Musculoskeletal: Normal range of motion and neck supple.      Thyroid: No thyromegaly.      Vascular: No JVD.      Trachea: No tracheal deviation.   Cardiovascular:  Rate and Rhythm: Normal rate and regular rhythm.      Heart sounds: Normal heart sounds. No murmur. No friction rub. No gallop.    Pulmonary:      Effort: Pulmonary effort is normal. No respiratory distress.      Breath sounds: Normal breath sounds. No wheezing or rales.   Chest:      Chest wall: No tenderness.   Abdominal:      General: Bowel sounds are normal. There is no distension.      Palpations: Abdomen is soft. There is no mass.       Tenderness: There is no abdominal tenderness. There is no guarding or rebound.   Musculoskeletal: Normal range of motion.         General: No tenderness.      Comments: Patient has some mild symmetric weakness in both upper and lower extremities which appears to be unchanged from previous exams.   Lymphadenopathy:      Cervical: No cervical adenopathy.   Skin:     General: Skin is warm and dry.      Findings: No erythema or rash.      Comments: Area of plaque-like hyperpigmentation noted on the patient's left lateral lower extremity.   Neurological:      Mental Status: She is alert and oriented to person, place, and time.      Cranial Nerves: No cranial nerve deficit.      Motor: No abnormal muscle tone.      Coordination: Coordination normal.      Deep Tendon Reflexes: Reflexes are normal and symmetric. Reflexes normal.   Psychiatric:         Behavior: Behavior normal.         Thought Content: Thought content normal.         Judgment: Judgment normal.         Medical problems and test results were reviewed with the patient today.         ASSESSMENT and PLAN    1.  Rash.  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  Most likely related to the patient's obesity and diabetes.  Offered referral to dermatology for second opinion.  Patient elects to observe for now.    2.  PMR.  Per rheumatology.  Continue prednisone.    3.  Normocytic anemia.  Followed by hematology.  Felt to be anemia of chronic disease.  Checking CBC.    4.  Diabetes.  Check hemoglobin A1c and urine microalbumin.  Encourage yearly retinal exams and daily feet exams.  Patient reports eye exams are up-to-date.    5.  Hypertension.  Check metabolic panel and TSH.  Blood pressure is 128/80.    6.  High cholesterol.  Check lipid panel.    7.  CAD.  Per cardiology.  No chest pain reported.    8.  Vitamin D deficiency.  Checking vitamin D level.    9.  Severe obesity.  BMI is 38.3.  Dietary counseling provided.     Elements of this note have been dictated using speech recognition software. As a result, errors of speech recognition may have occurred.

## 2018-10-12 NOTE — Progress Notes (Signed)
This is the Subsequent Medicare Annual Wellness Exam, performed 12 months or more after the Initial AWV or the last Subsequent AWV    I have reviewed the patient's medical history in detail and updated the computerized patient record.     History     Patient Active Problem List   Diagnosis Code   ??? HTN (hypertension) I10   ??? Diabetes mellitus type 2, controlled (HCC) E11.9   ??? Coronary artery disease involving native coronary artery of native heart without angina pectoris I25.10   ??? Dyslipidemia E78.5   ??? OSA on CPAP G47.33, Z99.89   ??? Severe obesity (HCC) E66.01   ??? Elevated sed rate R70.0   ??? Normocytic anemia D64.9     Past Medical History:   Diagnosis Date   ??? Abdominal pain 07/22/2015   ??? Anemia    ??? Back pain    ??? Bursitis of shoulder    ??? CAD (coronary artery disease)    ??? Cerumen impaction    ??? Chest pain, atypical 07/22/2015   ??? Diabetes (HCC)     no meds, A1c 09/28/17 6.6; avg bs 105; denies ss of hypo   ??? Diabetes mellitus out of control (HCC)    ??? Endocrine disease     thyroid   ??? Fatigue    ??? GERD (gastroesophageal reflux disease)     controlled with medicaitons   ??? Hip pain, right    ??? Hyperlipidemia, mild    ??? Hypertension     managed with medication   ??? Hypertension associated with diabetes (HCC) 07/22/2015   ??? 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 Left carotid bruit    ??? Lumbago 07/22/2015   ??? Morbid obesity (HCC) 07/22/2015    BMI 35.3   ??? OSA on CPAP 07/22/2015    uses CPAP   ??? Other ill-defined conditions(799.89)     cholesterol   ??? Other ill-defined conditions(799.89)     heart cath 96   ??? Shoulder pain, acute    ??? Shoulder pain, bilateral    ??? Sinusitis, acute maxillary    ??? Unstable angina (HCC) 07/06/2015   ??? Vitamin D deficiency    ??? Weakness       Past Surgical History:   Procedure Laterality Date   ??? COLONOSCOPY N/A 03/01/2018    COLONOSCOPY/BMI 39 performed by Mazanec, Worthy Rancher, MD at [facility] ENDOSCOPY   ??? HX COLONOSCOPY     ??? HX HEART CATHETERIZATION  2016    without  intervention   ??? HX HEENT      goiter/thyroid surgery   ??? HX THYROIDECTOMY       Current Outpatient Medications   Medication Sig Dispense Refill   ??? amoxicillin-clavulanate (AUGMENTIN) 875-125 mg per tablet TK 1 T PO BID FOR 10 DAYS     ??? predniSONE (DELTASONE) 5 mg tablet Take 1 pill once a day after breakfast. 30 Tab 2   ??? predniSONE (DELTASONE) 1 mg tablet Take as directed. 120 Tab 1   ??? pravastatin (PRAVACHOL) 80 mg tablet Take 1 Tab by mouth daily. 90 Tab 3   ??? lisinopril-hydroCHLOROthiazide (PRINZIDE, ZESTORETIC) 20-12.5 mg per tablet Take 1 Tab by mouth daily. 90 Tab 3   ??? famotidine (PEPCID) 20 mg tablet take 1 tablet by mouth once daily 90 Tab 3   ??? folic acid 400 mcg tablet Take 400 mcg by mouth daily.     ??? nitroglycerin (NITROSTAT) 0.4 mg SL tablet Place 1  sl under the tongue q 5 min prn cp, max 3 sl in a 15-min time period. Call 911 if no relief after the 3rd sl. 1 Bottle prn   ??? aspirin 81 mg chewable tablet Take 1 Tab by mouth daily. 30 Tab 11     Allergies   Allergen Reactions   ??? Ativan [Lorazepam] Other (comments)     Made her feel crazy and chest pressure   ??? Ativan [Lorazepam] Other (comments)     Chest fullness   ??? Other Medication Palpitations     Some type of decongestant   ??? Other Medication Other (comments)     Doesn't take decongestants, but can't recall why       Family History   Problem Relation Age of Onset   ??? Stroke Sister    ??? Cancer Sister         cervical   ??? Stroke Brother    ??? Diabetes Other    ??? Hypertension Other      Social History     Tobacco Use   ??? Smoking status: Former Smoker     Last attempt to quit: 1971     Years since quitting: 49.1   ??? Smokeless tobacco: Never Used   ??? Tobacco comment: quit in 1971   Substance Use Topics   ??? Alcohol use: No       Depression Risk Factor Screening:     3 most recent PHQ Screens 10/12/2018   Little interest or pleasure in doing things Not at all   Feeling down, depressed, irritable, or hopeless Not at all   Total Score PHQ 2 0   Trouble  falling or staying asleep, or sleeping too much -   Feeling tired or having little energy -   Poor appetite, weight loss, or overeating -   Feeling bad about yourself - or that you are a failure or have let yourself or your family down -   Trouble concentrating on things such as school, work, reading, or watching TV -   Moving or speaking so slowly that other people could have noticed; or the opposite being so fidgety that others notice -   Thoughts of being better off dead, or hurting yourself in some way -   PHQ 9 Score -   How difficult have these problems made it for you to do your work, take care of your home and get along with others -       Alcohol Risk Factor Screening:   Do you average 1 drink per night or more than 7 drinks a week:  No    On any one occasion in the past three months have you have had more than 3 drinks containing alcohol:  No      Functional Ability and Level of Safety:   Hearing: Hearing is good.    Activities of Daily Living:  The home contains: no safety equipment.  Patient does total self care    Ambulation: with mild difficulty    Fall Risk:  Fall Risk Assessment, last 12 mths 07/13/2018   Able to walk? Yes   Fall in past 12 months? Yes   Fall with injury? No   Number of falls in past 12 months 2   Fall Risk Score 2       Abuse Screen:  Patient is not abused    Cognitive Screening   Has your family/caregiver stated any concerns about your memory: no  Cognitive Screening: Formal  cognitive screening does not appear to be clinically indicated.    Patient Care Team   Patient Care Team:  Delana MeyerNewman, Seidy Labreck M, MD as PCP - General (Family Practice)  Delana MeyerNewman, Avalynne Diver M, MD as PCP - [facility] Empaneled Provider  Maree KrabbeFreeman, Ned D, MD (Cardiology)    Assessment/Plan   Education and counseling provided:  Are appropriate based on today's review and evaluation  End-of-Life planning (with patient's consent)    Diagnoses and all orders for this visit:    1. Medicare annual wellness visit, subsequent    Patient is  here for Medicare wellness exam.  Anticipatory guidance discussed including the importance of sunscreen use, helmet use and seatbelt use.  No depressive symptoms are reported.  Risk for falls is minimal.  Cognitive functioning appears to be intact.  Pneumococcal vaccines including Prevnar 13 and Pneumovax 23 are up-to-date.  Screening colonoscopy is up-to-date.  Advanced care planning discussed with the patient and documented in a separate note.      Health Maintenance Due   Topic Date Due   ??? Foot Exam Q1  12/04/1950   ??? DTaP/Tdap/Td series (1 - Tdap) 12/04/1949   ??? Shingrix Vaccine Age 42> (1 of 2) 12/04/1988   ??? GLAUCOMA SCREENING Q2Y  04/09/2017   ??? Eye Exam Retinal or Dilated  04/09/2017   ??? Influenza Age 20 to Adult  04/07/2018   ??? MICROALBUMIN Q1  09/28/2018

## 2018-10-12 NOTE — Addendum Note (Signed)
Addendum Note by Delana Meyer, MD at 10/12/18 1400                Author: Delana Meyer, MD  Service: --  Author Type: Physician       Filed: 10/20/18 1359  Encounter Date: 10/12/2018  Status: Signed          Editor: Delana Meyer, MD (Physician)          Addended by: Delana Meyer on: 10/20/2018 01:59 PM    Modules accepted: Level of Service

## 2018-10-12 NOTE — Progress Notes (Signed)
SUBJECTIVE:   Sarah Chavez is a 78 y.o. female who reports that she is noticed a discoloration of her skin on her left lower calf.  It does not itch or hurt.  She is not sure how long it is been this way.  She thinks is been at least several months.  No new soaps, detergents or fabric softeners are reported.  Patient has been on prednisone chronically for polymyalgia rheumatica.  She reports that this is doing relatively well and her PMR seems to be doing okay.  She reports no visual disturbance or loss.  She reports no headaches.  She is followed closely by rheumatology.  In addition the patient has a past medical history significant for normocytic anemia felt to be anemia of chronic disease, diabetes, hypertension, high cholesterol, CAD, vitamin D deficiency and obesity.  Patient denies chest pain, shortness of breath, orthopnea or PND.  GI and GU review of systems is unremarkable.    HPI  See above    Past Medical History, Past Surgical History, Family history, Social History, and Medications were all reviewed with the patient today and updated as necessary.       Current Outpatient Medications   Medication Sig Dispense Refill   ??? amoxicillin-clavulanate (AUGMENTIN) 875-125 mg per tablet TK 1 T PO BID FOR 10 DAYS     ??? predniSONE (DELTASONE) 5 mg tablet Take 1 pill once a day after breakfast. 30 Tab 2   ??? predniSONE (DELTASONE) 1 mg tablet Take as directed. 120 Tab 1   ??? pravastatin (PRAVACHOL) 80 mg tablet Take 1 Tab by mouth daily. 90 Tab 3   ??? lisinopril-hydroCHLOROthiazide (PRINZIDE, ZESTORETIC) 20-12.5 mg per tablet Take 1 Tab by mouth daily. 90 Tab 3   ??? famotidine (PEPCID) 20 mg tablet take 1 tablet by mouth once daily 90 Tab 3   ??? folic acid 400 mcg tablet Take 400 mcg by mouth daily.     ??? nitroglycerin (NITROSTAT) 0.4 mg SL tablet Place 1 sl under the tongue q 5 min prn cp, max 3 sl in a 15-min time period. Call 911 if no relief after the 3rd sl. 1 Bottle prn   ??? aspirin 81 mg chewable tablet  Take 1 Tab by mouth daily. 30 Tab 11     Allergies   Allergen Reactions   ??? Ativan [Lorazepam] Other (comments)     Made her feel crazy and chest pressure   ??? Ativan [Lorazepam] Other (comments)     Chest fullness   ??? Other Medication Palpitations     Some type of decongestant   ??? Other Medication Other (comments)     Doesn't take decongestants, but can't recall why     Patient Active Problem List   Diagnosis Code   ??? HTN (hypertension) I10   ??? Diabetes mellitus type 2, controlled (HCC) E11.9   ??? Coronary artery disease involving native coronary artery of native heart without angina pectoris I25.10   ??? Dyslipidemia E78.5   ??? OSA on CPAP G47.33, Z99.89   ??? Severe obesity (HCC) E66.01   ??? Elevated sed rate R70.0   ??? Normocytic anemia D64.9     Past Medical History:   Diagnosis Date   ??? Abdominal pain 07/22/2015   ??? Anemia    ??? Back pain    ??? Bursitis of shoulder    ??? CAD (coronary artery disease)    ??? Cerumen impaction    ??? Chest pain, atypical 07/22/2015   ???  Diabetes (HCC)     no meds, A1c 09/28/17 6.6; avg bs 105; denies ss of hypo   ??? Diabetes mellitus out of control (HCC)    ??? Endocrine disease     thyroid   ??? Fatigue    ??? GERD (gastroesophageal reflux disease)     controlled with medicaitons   ??? Hip pain, right    ??? Hyperlipidemia, mild    ??? Hypertension     managed with medication   ??? Hypertension associated with diabetes (HCC) 07/22/2015   ??? 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 Left carotid bruit    ??? Lumbago 07/22/2015   ??? Morbid obesity (HCC) 07/22/2015    BMI 35.3   ??? OSA on CPAP 07/22/2015    uses CPAP   ??? Other ill-defined conditions(799.89)     cholesterol   ??? Other ill-defined conditions(799.89)     heart cath 96   ??? Shoulder pain, acute    ??? Shoulder pain, bilateral    ??? Sinusitis, acute maxillary    ??? Unstable angina (HCC) 07/06/2015   ??? Vitamin D deficiency    ??? Weakness      Past Surgical History:   Procedure Laterality Date   ??? COLONOSCOPY N/A 03/01/2018    COLONOSCOPY/BMI 39  performed by Mazanec, Worthy Rancher, MD at [facility] ENDOSCOPY   ??? HX COLONOSCOPY     ??? HX HEART CATHETERIZATION  2016    without intervention   ??? HX HEENT      goiter/thyroid surgery   ??? HX THYROIDECTOMY       Family History   Problem Relation Age of Onset   ??? Stroke Sister    ??? Cancer Sister         cervical   ??? Stroke Brother    ??? Diabetes Other    ??? Hypertension Other      Social History     Tobacco Use   ??? Smoking status: Former Smoker     Last attempt to quit: 1971     Years since quitting: 49.1   ??? Smokeless tobacco: Never Used   ??? Tobacco comment: quit in 1971   Substance Use Topics   ??? Alcohol use: No         Review of Systems  See above    OBJECTIVE:  Visit Vitals  BP 128/80   Ht 4\' 11"  (1.499 m)   Wt 190 lb (86.2 kg)   BMI 38.38 kg/m??        Physical Exam  Constitutional:       Appearance: She is well-developed. She is obese.   HENT:      Head: Normocephalic and atraumatic.      Right Ear: External ear normal.      Left Ear: External ear normal.      Nose: Nose normal.      Mouth/Throat:      Pharynx: No oropharyngeal exudate.   Eyes:      General: No scleral icterus.        Right eye: No discharge.         Left eye: No discharge.      Pupils: Pupils are equal, round, and reactive to light.   Neck:      Musculoskeletal: Normal range of motion and neck supple.      Thyroid: No thyromegaly.      Vascular: No JVD.      Trachea: No tracheal deviation.   Cardiovascular:  Rate and Rhythm: Normal rate and regular rhythm.      Heart sounds: Normal heart sounds. No murmur. No friction rub. No gallop.    Pulmonary:      Effort: Pulmonary effort is normal. No respiratory distress.      Breath sounds: Normal breath sounds. No wheezing or rales.   Chest:      Chest wall: No tenderness.   Abdominal:      General: Bowel sounds are normal. There is no distension.      Palpations: Abdomen is soft. There is no mass.      Tenderness: There is no abdominal tenderness. There is no guarding or rebound.   Musculoskeletal: Normal range  of motion.         General: No tenderness.      Comments: Patient has some mild symmetric weakness in both upper and lower extremities which appears to be unchanged from previous exams.   Lymphadenopathy:      Cervical: No cervical adenopathy.   Skin:     General: Skin is warm and dry.      Findings: No erythema or rash.      Comments: Area of plaque-like hyperpigmentation noted on the patient's left lateral lower extremity.   Neurological:      Mental Status: She is alert and oriented to person, place, and time.      Cranial Nerves: No cranial nerve deficit.      Motor: No abnormal muscle tone.      Coordination: Coordination normal.      Deep Tendon Reflexes: Reflexes are normal and symmetric. Reflexes normal.   Psychiatric:         Behavior: Behavior normal.         Thought Content: Thought content normal.         Judgment: Judgment normal.         Medical problems and test results were reviewed with the patient today.         ASSESSMENT and PLAN    1.  Rash.  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  Most likely related to the patient's obesity and diabetes.  Offered referral to dermatology for second opinion.  Patient elects to observe for now.    2.  PMR.  Per rheumatology.  Continue prednisone.    3.  Normocytic anemia.  Followed by hematology.  Felt to be anemia of chronic disease.  Checking CBC.    4.  Diabetes.  Check hemoglobin A1c and urine microalbumin.  Encourage yearly retinal exams and daily feet exams.  Patient reports eye exams are up-to-date.    5.  Hypertension.  Check metabolic panel and TSH.  Blood pressure is 128/80.    6.  High cholesterol.  Check lipid panel.    7.  CAD.  Per cardiology.  No chest pain reported.    8.  Vitamin D deficiency.  Checking vitamin D level.    9.  Severe obesity.  BMI is 38.3.  Dietary counseling provided.    Elements of this note have been dictated using speech recognition software. As a result, errors of speech recognition may have occurred.

## 2018-10-12 NOTE — Patient Instructions (Signed)
Medicare Wellness Visit, Female     The best way to live healthy is to have a lifestyle where you eat a well-balanced diet, exercise regularly, limit alcohol use, and quit all forms of tobacco/nicotine, if applicable.     Regular preventive services are another way to keep healthy. Preventive services (vaccines, screening tests, monitoring & exams) can help personalize your care plan, which helps you manage your own care. Screening tests can find health problems at the earliest stages, when they are easiest to treat.   [facility] follows the current, evidence-based guidelines published by the [place] [employer] (USPSTF) when recommending preventive services for our patients. Because we follow these guidelines, sometimes recommendations change over time as research supports it. (For example, mammograms used to be recommended annually. Even though Medicare will still pay for an annual mammogram, the newer guidelines recommend a mammogram every two years for women of average risk).  Of course, you and your doctor may decide to screen more often for some diseases, based on your risk and your co-morbidities (chronic disease you are already diagnosed with).     Preventive services for you include:  - Medicare offers their members a free annual wellness visit, which is time for you and your primary care provider to discuss and plan for your preventive service needs. Take advantage of this benefit every year!  -All adults over the age of 7 should receive the recommended pneumonia vaccines. Current USPSTF guidelines recommend a series of two vaccines for the best pneumonia protection.   -All adults should have a flu vaccine yearly and a tetanus vaccine every 10 years.   -All adults age 84 and older should receive the shingles vaccines (series of two vaccines).      -All adults age 70-70 who are overweight should have a diabetes screening test once every three years.    -All adults born between [birth year] and 1965 should be screened once for Hepatitis C.  -Other screening tests and preventive services for persons with diabetes include: an eye exam to screen for diabetic retinopathy, a kidney function test, a foot exam, and stricter control over your cholesterol.   -Cardiovascular screening for adults with routine risk involves an electrocardiogram (ECG) at intervals determined by your doctor.   -Colorectal cancer screenings should be done for adults age 64-75 with no increased risk factors for colorectal cancer.  There are a number of acceptable methods of screening for this type of cancer. Each test has its own benefits and drawbacks. Discuss with your doctor what is most appropriate for you during your annual wellness visit. The different tests include: colonoscopy (considered the best screening method), a fecal occult blood test, a fecal DNA test, and sigmoidoscopy.    -A bone mass density test is recommended when a woman turns 65 to screen for osteoporosis. This test is only recommended one time, as a screening. Some providers will use this same test as a disease monitoring tool if you already have osteoporosis.  -Breast cancer screenings are recommended every other year for women of normal risk, age 60-74.  -Cervical cancer screenings for women over age 40 are only recommended with certain risk factors.     Here is a list of your current Health Maintenance items (your personalized list of preventive services) with a due date:  Health Maintenance Due   Topic Date Due   ??? Diabetic Foot Care  12/04/1950   ??? DTaP/Tdap/Td  (1 - Tdap) 12/04/1949   ???  Shingles Vaccine (1 of 2) 12/04/1988   ??? Glaucoma Screening   04/09/2017   ??? Eye Exam  04/09/2017   ??? Flu Vaccine  04/07/2018   ??? Albumin Urine Test  09/28/2018

## 2018-10-12 NOTE — Progress Notes (Signed)
This is the Subsequent Medicare Annual Wellness Exam, performed 12 months or more after the Initial AWV or the last Subsequent AWV    I have reviewed the patient's medical history in detail and updated the computerized patient record.     History     Patient Active Problem List   Diagnosis Code   ??? HTN (hypertension) I10   ??? Diabetes mellitus type 2, controlled (HCC) E11.9   ??? Coronary artery disease involving native coronary artery of native heart without angina pectoris I25.10   ??? Dyslipidemia E78.5   ??? OSA on CPAP G47.33, Z99.89   ??? Severe obesity (HCC) E66.01   ??? Elevated sed rate R70.0   ??? Normocytic anemia D64.9     Past Medical History:   Diagnosis Date   ??? Abdominal pain 07/22/2015   ??? Anemia    ??? Back pain    ??? Bursitis of shoulder    ??? CAD (coronary artery disease)    ??? Cerumen impaction    ??? Chest pain, atypical 07/22/2015   ??? Diabetes (HCC)     no meds, A1c 09/28/17 6.6; avg bs 105; denies ss of hypo   ??? Diabetes mellitus out of control (HCC)    ??? Endocrine disease     thyroid   ??? Fatigue    ??? GERD (gastroesophageal reflux disease)     controlled with medicaitons   ??? Hip pain, right    ??? Hyperlipidemia, mild    ??? Hypertension     managed with medication   ??? Hypertension associated with diabetes (HCC) 07/22/2015   ??? 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 Left carotid bruit    ??? Lumbago 07/22/2015   ??? Morbid obesity (HCC) 07/22/2015    BMI 35.3   ??? OSA on CPAP 07/22/2015    uses CPAP   ??? Other ill-defined conditions(799.89)     cholesterol   ??? Other ill-defined conditions(799.89)     heart cath 96   ??? Shoulder pain, acute    ??? Shoulder pain, bilateral    ??? Sinusitis, acute maxillary    ??? Unstable angina (HCC) 07/06/2015   ??? Vitamin D deficiency    ??? Weakness       Past Surgical History:   Procedure Laterality Date   ??? COLONOSCOPY N/A 03/01/2018    COLONOSCOPY/BMI 39 performed by Mazanec, Worthy Rancher, MD at [facility] ENDOSCOPY   ??? HX COLONOSCOPY     ??? HX HEART CATHETERIZATION  2016     without intervention   ??? HX HEENT      goiter/thyroid surgery   ??? HX THYROIDECTOMY       Current Outpatient Medications   Medication Sig Dispense Refill   ??? amoxicillin-clavulanate (AUGMENTIN) 875-125 mg per tablet TK 1 T PO BID FOR 10 DAYS     ??? predniSONE (DELTASONE) 5 mg tablet Take 1 pill once a day after breakfast. 30 Tab 2   ??? predniSONE (DELTASONE) 1 mg tablet Take as directed. 120 Tab 1   ??? pravastatin (PRAVACHOL) 80 mg tablet Take 1 Tab by mouth daily. 90 Tab 3   ??? lisinopril-hydroCHLOROthiazide (PRINZIDE, ZESTORETIC) 20-12.5 mg per tablet Take 1 Tab by mouth daily. 90 Tab 3   ??? famotidine (PEPCID) 20 mg tablet take 1 tablet by mouth once daily 90 Tab 3   ??? folic acid 400 mcg tablet Take 400 mcg by mouth daily.     ??? nitroglycerin (NITROSTAT) 0.4 mg SL tablet Place 1  sl under the tongue q 5 min prn cp, max 3 sl in a 15-min time period. Call 911 if no relief after the 3rd sl. 1 Bottle prn   ??? aspirin 81 mg chewable tablet Take 1 Tab by mouth daily. 30 Tab 11     Allergies   Allergen Reactions   ??? Ativan [Lorazepam] Other (comments)     Made her feel crazy and chest pressure   ??? Ativan [Lorazepam] Other (comments)     Chest fullness   ??? Other Medication Palpitations     Some type of decongestant   ??? Other Medication Other (comments)     Doesn't take decongestants, but can't recall why       Family History   Problem Relation Age of Onset   ??? Stroke Sister    ??? Cancer Sister         cervical   ??? Stroke Brother    ??? Diabetes Other    ??? Hypertension Other      Social History     Tobacco Use   ??? Smoking status: Former Smoker     Last attempt to quit: 1971     Years since quitting: 49.1   ??? Smokeless tobacco: Never Used   ??? Tobacco comment: quit in 1971   Substance Use Topics   ??? Alcohol use: No       Depression Risk Factor Screening:     3 most recent PHQ Screens 10/12/2018   Little interest or pleasure in doing things Not at all   Feeling down, depressed, irritable, or hopeless Not at all   Total Score PHQ 2 0    Trouble falling or staying asleep, or sleeping too much -   Feeling tired or having little energy -   Poor appetite, weight loss, or overeating -   Feeling bad about yourself - or that you are a failure or have let yourself or your family down -   Trouble concentrating on things such as school, work, reading, or watching TV -   Moving or speaking so slowly that other people could have noticed; or the opposite being so fidgety that others notice -   Thoughts of being better off dead, or hurting yourself in some way -   PHQ 9 Score -   How difficult have these problems made it for you to do your work, take care of your home and get along with others -       Alcohol Risk Factor Screening:   Do you average 1 drink per night or more than 7 drinks a week:  No    On any one occasion in the past three months have you have had more than 3 drinks containing alcohol:  No      Functional Ability and Level of Safety:   Hearing: Hearing is good.    Activities of Daily Living:  The home contains: no safety equipment.  Patient does total self care    Ambulation: with mild difficulty    Fall Risk:  Fall Risk Assessment, last 12 mths 07/13/2018   Able to walk? Yes   Fall in past 12 months? Yes   Fall with injury? No   Number of falls in past 12 months 2   Fall Risk Score 2       Abuse Screen:  Patient is not abused    Cognitive Screening   Has your family/caregiver stated any concerns about your memory: no  Cognitive Screening: Formal  cognitive screening does not appear to be clinically indicated.    Patient Care Team   Patient Care Team:  Delana Meyer, MD as PCP - General (Family Practice)  Delana Meyer, MD as PCP - [facility] Empaneled Provider  Maree Krabbe, MD (Cardiology)    Assessment/Plan   Education and counseling provided:  Are appropriate based on today's review and evaluation  End-of-Life planning (with patient's consent)    Diagnoses and all orders for this visit:    1. Medicare annual wellness visit, subsequent     Patient is here for Medicare wellness exam.  Anticipatory guidance discussed including the importance of sunscreen use, helmet use and seatbelt use.  No depressive symptoms are reported.  Risk for falls is minimal.  Cognitive functioning appears to be intact.  Pneumococcal vaccines including Prevnar 13 and Pneumovax 23 are up-to-date.  Screening colonoscopy is up-to-date.  Advanced care planning discussed with the patient and documented in a separate note.      Health Maintenance Due   Topic Date Due   ??? Foot Exam Q1  12/04/1948   ??? DTaP/Tdap/Td series (1 - Tdap) 12/04/1949   ??? Shingrix Vaccine Age 43> (1 of 2) 12/04/1988   ??? GLAUCOMA SCREENING Q2Y  04/09/2017   ??? Eye Exam Retinal or Dilated  04/09/2017   ??? Influenza Age 73 to Adult  04/07/2018   ??? MICROALBUMIN Q1  09/28/2018

## 2018-10-13 LAB — METABOLIC PANEL, COMPREHENSIVE
A-G Ratio: 1.4 (ref 1.2–2.2)
ALT (SGPT): 18 IU/L (ref 0–32)
AST (SGOT): 20 IU/L (ref 0–40)
Albumin: 4.3 g/dL (ref 3.7–4.7)
Alk. phosphatase: 46 IU/L (ref 39–117)
BUN/Creatinine ratio: 25 (ref 12–28)
BUN: 26 mg/dL (ref 8–27)
Bilirubin, total: 0.5 mg/dL (ref 0.0–1.2)
CO2: 22 mmol/L (ref 20–29)
Calcium: 9.6 mg/dL (ref 8.7–10.3)
Chloride: 101 mmol/L (ref 96–106)
Creatinine: 1.06 mg/dL — ABNORMAL HIGH (ref 0.57–1.00)
GFR est AA: 58 mL/min/{1.73_m2} — ABNORMAL LOW (ref >59)
GFR est non-AA: 50 mL/min/{1.73_m2} — ABNORMAL LOW (ref >59)
GLOBULIN, TOTAL: 3.1 g/dL (ref 1.5–4.5)
Glucose: 129 mg/dL — ABNORMAL HIGH (ref 65–99)
Potassium: 4.5 mmol/L (ref 3.5–5.2)
Protein, total: 7.4 g/dL (ref 6.0–8.5)
Sodium: 140 mmol/L (ref 134–144)

## 2018-10-13 LAB — VITAMIN D, 25 HYDROXY: VITAMIN D, 25-HYDROXY: 12 ng/mL — ABNORMAL LOW (ref 30.0–100.0)

## 2018-10-13 LAB — LIPID PANEL
Cholesterol, Total: 204 mg/dL — ABNORMAL HIGH (ref 100–199)
Cholesterol, total: 204 mg/dL — ABNORMAL HIGH (ref 100–199)
HDL Cholesterol: 78 mg/dL (ref >39)
HDL: 78 mg/dL (ref >39)
LDL Calculated: 110 mg/dL — ABNORMAL HIGH (ref 0–99)
LDL, calculated: 110 mg/dL — ABNORMAL HIGH (ref 0–99)
Triglyceride: 82 mg/dL (ref 0–149)
Triglycerides: 82 mg/dL (ref 0–149)
VLDL Cholesterol Calculated: 16 mg/dL (ref 5–40)
VLDL, calculated: 16 mg/dL (ref 5–40)

## 2018-10-13 LAB — HEMOGLOBIN A1C WITH EAG
Estimated average glucose: 157 mg/dL
Hemoglobin A1c: 7.1 % — ABNORMAL HIGH (ref 4.8–5.6)

## 2018-10-13 LAB — TSH 3RD GENERATION
TSH: 0.536 u[IU]/mL (ref 0.450–4.500)
TSH: 0.536 u[IU]/mL (ref 0.450–4.500)

## 2018-10-13 LAB — COMPREHENSIVE METABOLIC PANEL
ALT: 18 IU/L (ref 0–32)
AST: 20 IU/L (ref 0–40)
Albumin/Globulin Ratio: 1.4 NA (ref 1.2–2.2)
Albumin: 4.3 g/dL (ref 3.7–4.7)
Alkaline Phosphatase: 46 IU/L (ref 39–117)
BUN: 26 mg/dL (ref 8–27)
Bun/Cre Ratio: 25 NA (ref 12–28)
CO2: 22 mmol/L (ref 20–29)
Calcium: 9.6 mg/dL (ref 8.7–10.3)
Chloride: 101 mmol/L (ref 96–106)
Creatinine: 1.06 mg/dL — ABNORMAL HIGH (ref 0.57–1.00)
EGFR IF NonAfrican American: 50 mL/min/{1.73_m2} — ABNORMAL LOW (ref >59)
GFR African American: 58 mL/min/{1.73_m2} — ABNORMAL LOW (ref >59)
Globulin, Total: 3.1 g/dL (ref 1.5–4.5)
Glucose: 129 mg/dL — ABNORMAL HIGH (ref 65–99)
Potassium: 4.5 mmol/L (ref 3.5–5.2)
Sodium: 140 mmol/L (ref 134–144)
Total Bilirubin: 0.5 mg/dL (ref 0.0–1.2)
Total Protein: 7.4 g/dL (ref 6.0–8.5)

## 2018-10-13 LAB — VITAMIN D 25 HYDROXY: Vit D, 25-Hydroxy: 12 ng/mL — ABNORMAL LOW (ref 30.0–100.0)

## 2018-10-13 LAB — HEMOGLOBIN A1C W/EAG
Hemoglobin A1C: 7.1 % — ABNORMAL HIGH (ref 4.8–5.6)
eAG: 157 mg/dL

## 2018-10-18 ENCOUNTER — Ambulatory Visit: Attending: Rheumatology | Primary: Family Medicine

## 2018-10-18 ENCOUNTER — Ambulatory Visit: Admit: 2018-10-18 | Discharge: 2018-10-18 | Payer: MEDICARE | Attending: Rheumatology | Primary: Family Medicine

## 2018-10-18 DIAGNOSIS — M353 Polymyalgia rheumatica: Secondary | ICD-10-CM

## 2018-10-18 MED ORDER — PREDNISONE 5 MG TAB
5 mg | ORAL_TABLET | ORAL | 1 refills | Status: DC
Start: 2018-10-18 — End: 2019-04-04

## 2018-10-18 MED ORDER — PREDNISONE 1 MG TAB
1 mg | ORAL_TABLET | ORAL | 0 refills | Status: DC
Start: 2018-10-18 — End: 2018-11-16

## 2018-10-18 NOTE — Progress Notes (Signed)
[facility] Rheumat[facility]  Jeffie Pollock, M.D.  [address], [place] [address]  Office : [phone], Fax: [phone]     RHEUMATOLOGY OFFICE VISIT NOTE  Date of Visit:  10/18/2018 9:50 AM    Patient Information:  Name:  Sarah Chavez  DOB:  05-30-1941  Age:  78 y.o.   Gender:  female      Sarah Chavez is here today for follow-up of PMR.      Last visit: 09/05/2018      History of Present Illness: On talking to the patient today she states that she has been on prednisone 7 mg once a day and has not had recurrence of temporal headaches. Has not had any double vision with no blurring of vision.  Denies any jaw pain either.  Her ongoing joint complaints are as mentioned below.    Since the last visit, patient is feeling "fair"      Pain: 8/10  Location:  Right shoulder pain worse than the left shoulder pain. Some pain with neck ROM with occasional right sided tension headaches. Occasional lower back pain. Occasional pain with no swelling of the right knee. Occasional buckling of the right knee. Occasional swelling with pain in the right ankle. No warmth and redness of the right ankle. Occasional pain and swelling of the right foot on the dorsum with no associated warmth and redness.   Quality: Deep achy pain in the shoulders.   Modifying Factors:  Movement and laying on either sides worsens the shoulder pain.   Associated Symptoms:  AM Stiffness: 30 min; No tingling, numbness or pain down the arms with constant tingling and numbness of both the hands. No pain, tingling and numbness of the legs except for numbness but no tingling of both the feet. Some right LE weakness. Has a weak grip with some difficulty opening jars with some difficulty buttoning and unbuttoning as well.   Fatigue: 6/10  MDHAQ: 1.3      Last TB screen:10+ years  TB result:negative  ??  Current dose of steroids:prednisone 7 mg   How long on current dose of steroids: 10 days    How long on continuous steroid therapy:3 - 4   months  ??  Past DMARDs, if applicable (methotrexate, plaquenil/hydroxychloroquine, sulfasalazine, Arava/leflunomide):none  ??  Past biologics,??if applicable??(enbrel, humira, simponi, cimzia, [place], [place], remicade, simponi aria, actemra, rituximab, Leonie Man, cosentyx):none  ??  Past NSAIDs, if applicable (motrin, aleve, naproxen, advil, ibuprofen, celebrex, voltaren/diclofenac, etc.)??ibuprofen.  ??  ??  Last BMD:??2013  Past osteoporosis drugs, if applicable (fosamax, actonel, boniva, reclast, prolia, forteo):none  ??  BMI:38.25  ??  Current exercise regimen, if JSE:GBTD  Current vitamin D dose:none  Current calcium dose:none  Fractures since last visit, if any:??none        The patient otherwise has no significant interval changes in health or medical history to report.     History Reviewed:    Past Medical History  Past Medical History:   Diagnosis Date   ??? Abdominal pain 07/22/2015   ??? Anemia    ??? Back pain    ??? Bursitis of shoulder    ??? CAD (coronary artery disease)    ??? Cerumen impaction    ??? Chest pain, atypical 07/22/2015   ??? Diabetes ([place])     no meds, A1c 09/28/17 6.6; avg bs 105; denies ss of hypo   ??? Diabetes mellitus out of control ([place])    ??? Endocrine disease  thyroid   ??? Fatigue    ??? GERD (gastroesophageal reflux disease)     controlled with medicaitons   ??? Hip pain, right    ??? Hyperlipidemia, mild    ??? Hypertension     managed with medication   ??? Hypertension associated with diabetes ([place]) 07/22/2015   ??? Hypertensive pulmonary vascular disease ([place]) 07/22/2015   ??? Left carotid bruit    ??? Lumbago 07/22/2015   ??? Morbid obesity ([place]) 07/22/2015    BMI 35.3   ??? OSA on CPAP 07/22/2015    uses CPAP   ??? Other ill-defined conditions(799.89)     cholesterol   ??? Other ill-defined conditions(799.89)     heart cath 96   ??? Shoulder pain, acute    ??? Shoulder pain, bilateral    ??? Sinusitis, acute maxillary    ??? Unstable angina ([place]) 07/06/2015   ??? Vitamin D deficiency    ??? Weakness        Past Surgical  History  Past Surgical History:   Procedure Laterality Date   ??? COLONOSCOPY N/A 03/01/2018    COLONOSCOPY/BMI 39 performed by Mazanec, Dorene Ar, MD at [facility] ENDOSCOPY   ??? HX COLONOSCOPY     ??? HX HEART CATHETERIZATION  2016    without intervention   ??? HX HEENT      goiter/thyroid surgery   ??? HX THYROIDECTOMY         Family History  Family History   Problem Relation Age of Onset   ??? Stroke Sister    ??? Cancer Sister         cervical   ??? Stroke Brother    ??? Diabetes Other    ??? Hypertension Other        Social History  Social History     Socioeconomic History   ??? Marital status: SINGLE     Spouse name: Not on file   ??? Number of children: Not on file   ??? Years of education: Not on file   ??? Highest education level: Not on file   Tobacco Use   ??? Smoking status: Former Smoker     Last attempt to quit: 1971     Years since quitting: 49.1   ??? Smokeless tobacco: Never Used   ??? Tobacco comment: quit in 1971   Substance and Sexual Activity   ??? Alcohol use: No   ??? Drug use: No   Social History Narrative    ** Merged History Encounter **                    Allergy:  Allergies   Allergen Reactions   ??? Ativan [Lorazepam] Other (comments)     Made her feel crazy and chest pressure   ??? Ativan [Lorazepam] Other (comments)     Chest fullness   ??? Other Medication Palpitations     Some type of decongestant   ??? Other Medication Other (comments)     Doesn't take decongestants, but can't recall why         Current Medications:  Outpatient Encounter Medications as of 10/18/2018   Medication Sig Dispense Refill   ??? predniSONE (DELTASONE) 5 mg tablet Take 1 pill once a day after breakfast. 30 Tab 1   ??? predniSONE (DELTASONE) 1 mg tablet Take 1 pill once a day after breakfast for 3 weeks and then stop. 21 Tab 0   ??? pravastatin (PRAVACHOL) 80 mg tablet Take 1 Tab by mouth daily. Protection  Tab 3   ??? lisinopril-hydroCHLOROthiazide (PRINZIDE, ZESTORETIC) 20-12.5 mg per tablet Take 1 Tab by mouth daily. 90 Tab 3   ??? famotidine (PEPCID) 20 mg tablet take 1 tablet  by mouth once daily 90 Tab 3   ??? folic acid 660 mcg tablet Take 400 mcg by mouth daily.     ??? nitroglycerin (NITROSTAT) 0.4 mg SL tablet Place 1 sl under the tongue q 5 min prn cp, max 3 sl in a 15-min time period. Call 911 if no relief after the 3rd sl. 1 Bottle prn   ??? aspirin 81 mg chewable tablet Take 1 Tab by mouth daily. 30 Tab 11   ??? [DISCONTINUED] amoxicillin-clavulanate (AUGMENTIN) 875-125 mg per tablet TK 1 T PO BID FOR 10 DAYS     ??? [DISCONTINUED] predniSONE (DELTASONE) 5 mg tablet Take 1 pill once a day after breakfast. 30 Tab 2   ??? [DISCONTINUED] predniSONE (DELTASONE) 1 mg tablet Take as directed. 120 Tab 1     No facility-administered encounter medications on file as of 10/18/2018.            REVIEW OF SYSTEMS: The following systems were reviewed with patient today and were negative except for the following (depicted with an "X"):        "X" General  "X" Head and Neck  "X" Heart and Breathing  "X" Gastrointestinal    Fever/chills   Hair loss  x Shortness of breath   Upset stomach    Falls   Dry mouth  x Coughing   Diarrhea / constipation    Wt loss   Mouth sores   Wheezing   Heartburn   x Wt gain  x Ringing ears  x Chest pain   Dark or bloody stools    Night sweats   Diff. swallowing   None of above   Nausea or vomiting    None of above   None of above     X None of above                "X" Skin  "X" Neurology  "X" Urinary/Gyn  "X" Other    Easy bruising  x Numbness/ tingling   Female problems   Depression   x Rashes  x Weakness   Problems with urination   Feeling anxious    Sun sensitivity  x Headaches  X None of above  x Problems sleeping    None of above   None of above      None of above          Physical Exam:  Blood pressure 126/60, height '4\' 11"'$  (1.499 m), weight 189 lb 6.4 oz (85.9 kg).  General:  Patient alert, cooperative and in no apparent distress.  HEENT: Pupils equally reactive to light and accommodation, minimal scleral injection noted.  Absence of temporal tenderness on palpation with  no jaw pain with opening and closing of her mouth.  Neck supple, no lymphadenopathy, no thyromegaly.  Heart: Regular rate and rhythm, normal S1 and S2, audible murmur with no rubs or gallops.  Lungs: Clear to auscultation bilaterally.  Abdomen: Soft, nontender, no hepatosplenomegaly.  Skin:  No rashes. No nail abnormalities.  Neurologic:  Oriented, normal speech and affect.  Normal gait.    Extremities:  No edema in bilateral lower extremities with no cyanosis or clubbing.    Muskoskeletal Exam:     I examined the shoulders, elbows, wrists, MCPs, PIPs, DIPs and knees bilaterally for strength, range  of motion, deformity, tenderness, swelling, and synovitis.      The findings are: Does have tenderness on palpation of the [facility] joint of both thumbs with noted squaring but no synovitis, warmth or redness.  Does have tenderness on palpation of the wrists on the dorsum with no synovitis, warmth or redness.  Does have intact range of motion to flexion as well as extension of the fingers in both hands as well as the wrists.  Does have tenderness on palpation of the shoulders both anteriorly as well as superiorly with limited abduction to greater than 90 degrees with limited internal rotation as well.  Does have tenderness on palpation of the C-spine as well as the paraspinal muscles of the neck with tenderness on palpation of the L-spine with bilateral SI joint tenderness.  Does have tenderness on palpation of the knees in the mid joint line with associated synovitis but no overlying warmth or redness.  Does have joint crepitus on flexion as well as extension of the knees.  Does have tenderness on palpation of the ankles laterally with trace synovitis but no overlying warmth or redness.  Does have metatarsalgia in both feet with no tenderness, synovitis, warmth or redness involving the right or left first of fifth MTP joints.    Patient otherwise has a normal joint exam without other evidence of joint tenderness, synovitis,  warmth, erythema, decreased ROM, weakness or deformities.         Radiology Reports Reviewed (if available):  Last 3 months  No results found.    Lab Reports Reviewed (if available): Last 3 months    Office Visit on 10/12/2018   Component Date Value Ref Range Status   ??? WBC (POC) 10/12/2018 8.6  4.1 - 10.9 10^3/ul Final   ??? ABS. LYMPHS (POC) 10/12/2018 1.7  0.6 - 4.1 10^3/ul Final   ??? Mid # (POC) 10/12/2018 0.5  0.0 - 1.8 10^3/ul Final   ??? ABS. GRANS (POC) 10/12/2018 6.4  2.0 - 7.8 10^3/ul Final   ??? LYMPHOCYTES (POC) 10/12/2018 20.3  10.0 - 58.5 % Final   ??? MID% POC 10/12/2018 5.4  0.1 - 24.0 % Final   ??? GRANULOCYTES (POC) 10/12/2018 74.3  37.0 - 92.0 % Final   ??? RBC (POC) 10/12/2018 4.62  4.20 - 6.30 10^6/ul Final   ??? HGB (POC) 10/12/2018 13.1  12.0 - 18.0 g/dL Final   ??? HCT (POC) 10/12/2018 41.4  37.0 - 51.0 % Final   ??? MCV (POC) 10/12/2018 89.7  80.0 - 97.0 fL Final   ??? MCH (POC) 10/12/2018 28.4  26.0 - 32.0 pg Final   ??? MCHC (POC) 10/12/2018 31.6  31.0 - 36.0 g/dL Final   ??? RDW (POC) 10/12/2018 12.5  11.5 - 14.5 % Final   ??? PLATELET (POC) 10/12/2018 203  140 - 440 10^3/ul Final   ??? MPV (POC) 10/12/2018 7.3  0.0 - 49.9 fL Final   ??? Glucose 10/12/2018 129* 65 - 99 mg/dL Final   ??? BUN 10/12/2018 26  8 - 27 mg/dL Final   ??? Creatinine 10/12/2018 1.06* 0.57 - 1.00 mg/dL Final   ??? GFR est non-AA 10/12/2018 50* >59 mL/min/1.73 Final   ??? GFR est AA 10/12/2018 58* >59 mL/min/1.73 Final   ??? BUN/Creatinine ratio 10/12/2018 25  12 - 28 Final   ??? Sodium 10/12/2018 140  134 - 144 mmol/L Final   ??? Potassium 10/12/2018 4.5  3.5 - 5.2 mmol/L Final   ??? Chloride 10/12/2018 101  96 -  106 mmol/L Final   ??? CO2 10/12/2018 22  20 - 29 mmol/L Final   ??? Calcium 10/12/2018 9.6  8.7 - 10.3 mg/dL Final   ??? Protein, total 10/12/2018 7.4  6.0 - 8.5 g/dL Final   ??? Albumin 10/12/2018 4.3  3.7 - 4.7 g/dL Final                  **Please note reference interval change**   ??? GLOBULIN, TOTAL 10/12/2018 3.1  1.5 - 4.5 g/dL Final   ??? A-G Ratio  10/12/2018 1.4  1.2 - 2.2 Final   ??? Bilirubin, total 10/12/2018 0.5  0.0 - 1.2 mg/dL Final   ??? Alk. phosphatase 10/12/2018 46  39 - 117 IU/L Final   ??? AST (SGOT) 10/12/2018 20  0 - 40 IU/L Final   ??? ALT (SGPT) 10/12/2018 18  0 - 32 IU/L Final   ??? Hemoglobin A1c 10/12/2018 7.1* 4.8 - 5.6 % Final    Comment:          Prediabetes: 5.7 - 6.4           Diabetes: >6.4           Glycemic control for adults with diabetes: <7.0     ??? Estimated average glucose 10/12/2018 157  mg/dL Final   ??? Cholesterol, total 10/12/2018 204* 100 - 199 mg/dL Final   ??? Triglyceride 10/12/2018 82  0 - 149 mg/dL Final   ??? HDL Cholesterol 10/12/2018 78  >39 mg/dL Final   ??? VLDL, calculated 10/12/2018 16  5 - 40 mg/dL Final   ??? LDL, calculated 10/12/2018 110* 0 - 99 mg/dL Final   ??? TSH 10/12/2018 0.536  0.450 - 4.500 uIU/mL Final   ??? VITAMIN D, 25-HYDROXY 10/12/2018 12.0* 30.0 - 100.0 ng/mL Final    Comment: Vitamin D deficiency has been defined by the Institute of  Medicine and an Endocrine Society practice guideline as a  level of serum 25-OH vitamin D less than 20 ng/mL (1,2).  The Endocrine Society went on to further define vitamin D  insufficiency as a level between 21 and 29 ng/mL (2).  1. IOM (Institute of Medicine). 2010. Dietary reference     intakes for calcium and D. [place]: The     [employer].  2. Holick MF, Binkley NC, Bischoff-Ferrari HA, et al.     Evaluation, treatment, and prevention of vitamin D     deficiency: an Endocrine Society clinical practice     guideline. JCEM. 2011 Jul; 96(7):1911-30.     ??? Microalbumin urine (POC) 10/12/2018 20  MG/L Final   Office Visit on 09/05/2018   Component Date Value Ref Range Status   ??? C-Reactive Protein, Qt 09/05/2018 1  0 - 10 mg/L Final   ??? Sed rate (ESR) 09/05/2018 63* 0 - 40 mm/hr Final         The results above were reviewed and discussed with patient.         Assessment/Plan:   Sarah Chavez is a 78 y.o. female who presents with:     1. PMR (polymyalgia  rheumatica) ([place]): Starting tomorrow I did instruct the patient to decrease the prednisone dose down to 6 mg daily which she will remain on for the next 3 weeks following which she will come down to 5 mg of prednisone daily and she will remain on that dose until her follow-up visit with me.  If she does have recurrence of temporal headaches or jaw  pain with double vision or blurring of vision she was instructed to call me or go to the nearest ER ASAP.  -     predniSONE (DELTASONE) 5 mg tablet; Take 1 pill once a day after breakfast.  -     predniSONE (DELTASONE) 1 mg tablet; Take 1 pill once a day after breakfast for 3 weeks and then stop.  -     C REACTIVE PROTEIN, QT  -     SED RATE (ESR)    Disease activity plan:  As stated above.    Steroid management plan:  As stated above, if applicable.    Pain management plan:  As stated above, if applicable.    Weight management plan:  Weight loss through diet and exercise is always encouraged    Disease prognosis: Good        I appreciate the opportunity to continue to participate in the care of this patient.     Follow-up and Dispositions    ?? Return in about 6 weeks (around 11/29/2018).       Electronically signed by:  Jeffie Pollock, MD      This note was dictated using dragon voice recognition software.  It has been proofread, but there may still exist voice recognition errors that the author did not detect.                --------------------------------------------------------------------------------------------------------------------------------------------------------------------------------------------------------------------------------

## 2018-10-18 NOTE — Progress Notes (Signed)
[facility]  Jeffie Pollock, M.D.  [address], [place] [address]  Office : [phone], Fax: [phone]     RHEUMATOLOGY OFFICE VISIT NOTE  Date of Visit:  10/18/2018 9:50 AM    Patient Information:  Name:  Sarah Chavez  DOB:  [birth year]  Age:  78 y.o.   Gender:  female      Sarah Chavez is here today for follow-up of PMR.      Last visit: 09/05/2018      History of Present Illness: On talking to the patient today she states that she has been on prednisone 7 mg once a day and has not had recurrence of temporal headaches. Has not had any double vision with no blurring of vision.  Denies any jaw pain either.  Her ongoing joint complaints are as mentioned below.    Since the last visit, patient is feeling "fair"      Pain: 8/10  Location:  Right shoulder pain worse than the left shoulder pain. Some pain with neck ROM with occasional right sided tension headaches. Occasional lower back pain. Occasional pain with no swelling of the right knee. Occasional buckling of the right knee. Occasional swelling with pain in the right ankle. No warmth and redness of the right ankle. Occasional pain and swelling of the right foot on the dorsum with no associated warmth and redness.   Quality: Deep achy pain in the shoulders.   Modifying Factors:  Movement and laying on either sides worsens the shoulder pain.   Associated Symptoms:  AM Stiffness: 30 min; No tingling, numbness or pain down the arms with constant tingling and numbness of both the hands. No pain, tingling and numbness of the legs except for numbness but no tingling of both the feet. Some right LE weakness. Has a weak grip with some difficulty opening jars with some difficulty buttoning and unbuttoning as well.   Fatigue: 6/10  MDHAQ: 1.3      Last TB screen:10+ years  TB result:negative  ??  Current dose of steroids:prednisone 7 mg   How long on current dose of steroids: 10 days     How long on continuous steroid therapy:3 - 4  months  ??  Past DMARDs, if applicable (methotrexate, plaquenil/hydroxychloroquine, sulfasalazine, Arava/leflunomide):none  ??  Past biologics,??if applicable??(enbrel, humira, simponi, cimzia, [place], [place], remicade, simponi aria, actemra, rituximab, [place]ie [place], cosentyx):none  ??  Past NSAIDs, if applicable (motrin, aleve, naproxen, advil, ibuprofen, celebrex, voltaren/diclofenac, etc.)??ibuprofen.  ??  ??  Last BMD:??2013  Past osteoporosis drugs, if applicable (fosamax, actonel, boniva, reclast, prolia, forteo):none  ??  BMI:38.25  ??  Current exercise regimen, if SEG:BTDV  Current vitamin D dose:none  Current calcium dose:none  Fractures since last visit, if any:??none        The patient otherwise has no significant interval changes in health or medical history to report.     History Reviewed:    Past Medical History  Past Medical History:   Diagnosis Date   ??? Abdominal pain 07/22/2015   ??? Anemia    ??? Back pain    ??? Bursitis of shoulder    ??? CAD (coronary artery disease)    ??? Cerumen impaction    ??? Chest pain, atypical 07/22/2015   ??? Diabetes ([place])     no meds, A1c 09/28/17 6.6; avg bs 105; denies ss of hypo   ??? Diabetes mellitus out of control ([place])    ??? Endocrine disease  thyroid   ??? Fatigue    ??? GERD (gastroesophageal reflux disease)     controlled with medicaitons   ??? Hip pain, right    ??? Hyperlipidemia, mild    ??? Hypertension     managed with medication   ??? Hypertension associated with diabetes ([place]) 07/22/2015   ??? 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 Left carotid bruit    ??? Lumbago 07/22/2015   ??? Morbid obesity ([place]) 07/22/2015    BMI 35.3   ??? OSA on CPAP 07/22/2015    uses CPAP   ??? Other ill-defined conditions(799.89)     cholesterol   ??? Other ill-defined conditions(799.89)     heart cath 96   ??? Shoulder pain, acute    ??? Shoulder pain, bilateral    ??? Sinusitis, acute maxillary    ??? Unstable angina ([place]) 07/06/2015    ??? Vitamin D deficiency    ??? Weakness        Past Surgical History  Past Surgical History:   Procedure Laterality Date   ??? COLONOSCOPY N/A 03/01/2018    COLONOSCOPY/BMI 39 performed by Mazanec, Dorene Ar, MD at [facility] ENDOSCOPY   ??? HX COLONOSCOPY     ??? HX HEART CATHETERIZATION  2016    without intervention   ??? HX HEENT      goiter/thyroid surgery   ??? HX THYROIDECTOMY         Family History  Family History   Problem Relation Age of Onset   ??? Stroke Sister    ??? Cancer Sister         cervical   ??? Stroke Brother    ??? Diabetes Other    ??? Hypertension Other        Social History  Social History     Socioeconomic History   ??? Marital status: SINGLE     Spouse name: Not on file   ??? Number of children: Not on file   ??? Years of education: Not on file   ??? Highest education level: Not on file   Tobacco Use   ??? Smoking status: Former Smoker     Last attempt to quit: 1971     Years since quitting: 49.1   ??? Smokeless tobacco: Never Used   ??? Tobacco comment: quit in 1971   Substance and Sexual Activity   ??? Alcohol use: No   ??? Drug use: No   Social History Narrative    ** Merged History Encounter **                    Allergy:  Allergies   Allergen Reactions   ??? Ativan [Lorazepam] Other (comments)     Made her feel crazy and chest pressure   ??? Ativan [Lorazepam] Other (comments)     Chest fullness   ??? Other Medication Palpitations     Some type of decongestant   ??? Other Medication Other (comments)     Doesn't take decongestants, but can't recall why         Current Medications:  Outpatient Encounter Medications as of 10/18/2018   Medication Sig Dispense Refill   ??? predniSONE (DELTASONE) 5 mg tablet Take 1 pill once a day after breakfast. 30 Tab 1   ??? predniSONE (DELTASONE) 1 mg tablet Take 1 pill once a day after breakfast for 3 weeks and then stop. 21 Tab 0   ??? pravastatin (PRAVACHOL) 80 mg tablet Take 1 Tab by mouth daily. [place]  Tab 3   ??? lisinopril-hydroCHLOROthiazide (PRINZIDE, ZESTORETIC) 20-12.5 mg per  tablet Take 1 Tab by mouth daily. 90 Tab 3   ??? famotidine (PEPCID) 20 mg tablet take 1 tablet by mouth once daily 90 Tab 3   ??? folic acid 161 mcg tablet Take 400 mcg by mouth daily.     ??? nitroglycerin (NITROSTAT) 0.4 mg SL tablet Place 1 sl under the tongue q 5 min prn cp, max 3 sl in a 15-min time period. Call 911 if no relief after the 3rd sl. 1 Bottle prn   ??? aspirin 81 mg chewable tablet Take 1 Tab by mouth daily. 30 Tab 11   ??? [DISCONTINUED] amoxicillin-clavulanate (AUGMENTIN) 875-125 mg per tablet TK 1 T PO BID FOR 10 DAYS     ??? [DISCONTINUED] predniSONE (DELTASONE) 5 mg tablet Take 1 pill once a day after breakfast. 30 Tab 2   ??? [DISCONTINUED] predniSONE (DELTASONE) 1 mg tablet Take as directed. 120 Tab 1     No facility-administered encounter medications on file as of 10/18/2018.            REVIEW OF SYSTEMS: The following systems were reviewed with patient today and were negative except for the following (depicted with an "X"):        "X" General  "X" Head and Neck  "X" Heart and Breathing  "X" Gastrointestinal    Fever/chills   Hair loss  x Shortness of breath   Upset stomach    Falls   Dry mouth  x Coughing   Diarrhea / constipation    Wt loss   Mouth sores   Wheezing   Heartburn   x Wt gain  x Ringing ears  x Chest pain   Dark or bloody stools    Night sweats   Diff. swallowing   None of above   Nausea or vomiting    None of above   None of above     X None of above                "X" Skin  "X" Neurology  "X" Urinary/Gyn  "X" Other    Easy bruising  x Numbness/ tingling   Female problems   Depression   x Rashes  x Weakness   Problems with urination   Feeling anxious    Sun sensitivity  x Headaches  X None of above  x Problems sleeping    None of above   None of above      None of above          Physical Exam:  Blood pressure 126/60, height '4\' 11"'$  (1.499 m), weight 189 lb 6.4 oz (85.9 kg).  General:  Patient alert, cooperative and in no apparent distress.   HEENT: Pupils equally reactive to light and accommodation, minimal scleral injection noted.  Absence of temporal tenderness on palpation with no jaw pain with opening and closing of her mouth.  Neck supple, no lymphadenopathy, no thyromegaly.  Heart: Regular rate and rhythm, normal S1 and S2, audible murmur with no rubs or gallops.  Lungs: Clear to auscultation bilaterally.  Abdomen: Soft, nontender, no hepatosplenomegaly.  Skin:  No rashes. No nail abnormalities.  Neurologic:  Oriented, normal speech and affect.  Normal gait.    Extremities:  No edema in bilateral lower extremities with no cyanosis or clubbing.    Muskoskeletal Exam:     I examined the shoulders, elbows, wrists, MCPs, PIPs, DIPs and knees bilaterally for strength, range  of motion, deformity, tenderness, swelling, and synovitis.      The findings are: Does have tenderness on palpation of the [facility] joint of both thumbs with noted squaring but no synovitis, warmth or redness.  Does have tenderness on palpation of the wrists on the dorsum with no synovitis, warmth or redness.  Does have intact range of motion to flexion as well as extension of the fingers in both hands as well as the wrists.  Does have tenderness on palpation of the shoulders both anteriorly as well as superiorly with limited abduction to greater than 90 degrees with limited internal rotation as well.  Does have tenderness on palpation of the C-spine as well as the paraspinal muscles of the neck with tenderness on palpation of the L-spine with bilateral SI joint tenderness.  Does have tenderness on palpation of the knees in the mid joint line with associated synovitis but no overlying warmth or redness.  Does have joint crepitus on flexion as well as extension of the knees.  Does have tenderness on palpation of the ankles laterally with trace synovitis but no overlying warmth or redness.  Does have metatarsalgia in both feet with no tenderness, synovitis, warmth or  redness involving the right or left first of fifth MTP joints.    Patient otherwise has a normal joint exam without other evidence of joint tenderness, synovitis, warmth, erythema, decreased ROM, weakness or deformities.         Radiology Reports Reviewed (if available):  Last 3 months  No results found.    Lab Reports Reviewed (if available): Last 3 months    Office Visit on 10/12/2018   Component Date Value Ref Range Status   ??? WBC (POC) 10/12/2018 8.6  4.1 - 10.9 10^3/ul Final   ??? ABS. LYMPHS (POC) 10/12/2018 1.7  0.6 - 4.1 10^3/ul Final   ??? Mid # (POC) 10/12/2018 0.5  0.0 - 1.8 10^3/ul Final   ??? ABS. GRANS (POC) 10/12/2018 6.4  2.0 - 7.8 10^3/ul Final   ??? LYMPHOCYTES (POC) 10/12/2018 20.3  10.0 - 58.5 % Final   ??? MID% POC 10/12/2018 5.4  0.1 - 24.0 % Final   ??? GRANULOCYTES (POC) 10/12/2018 74.3  37.0 - 92.0 % Final   ??? RBC (POC) 10/12/2018 4.62  4.20 - 6.30 10^6/ul Final   ??? HGB (POC) 10/12/2018 13.1  12.0 - 18.0 g/dL Final   ??? HCT (POC) 10/12/2018 41.4  37.0 - 51.0 % Final   ??? MCV (POC) 10/12/2018 89.7  80.0 - 97.0 fL Final   ??? MCH (POC) 10/12/2018 28.4  26.0 - 32.0 pg Final   ??? MCHC (POC) 10/12/2018 31.6  31.0 - 36.0 g/dL Final   ??? RDW (POC) 10/12/2018 12.5  11.5 - 14.5 % Final   ??? PLATELET (POC) 10/12/2018 203  140 - 440 10^3/ul Final   ??? MPV (POC) 10/12/2018 7.3  0.0 - 49.9 fL Final   ??? Glucose 10/12/2018 129* 65 - 99 mg/dL Final   ??? BUN 10/12/2018 26  8 - 27 mg/dL Final   ??? Creatinine 10/12/2018 1.06* 0.57 - 1.00 mg/dL Final   ??? GFR est non-AA 10/12/2018 50* >59 mL/min/1.73 Final   ??? GFR est AA 10/12/2018 58* >59 mL/min/1.73 Final   ??? BUN/Creatinine ratio 10/12/2018 25  12 - 28 Final   ??? Sodium 10/12/2018 140  134 - 144 mmol/L Final   ??? Potassium 10/12/2018 4.5  3.5 - 5.2 mmol/L Final   ??? Chloride 10/12/2018 101  96 -  106 mmol/L Final   ??? CO2 10/12/2018 22  20 - 29 mmol/L Final   ??? Calcium 10/12/2018 9.6  8.7 - 10.3 mg/dL Final   ??? Protein, total 10/12/2018 7.4  6.0 - 8.5 g/dL Final    ??? Albumin 10/12/2018 4.3  3.7 - 4.7 g/dL Final                  **Please note reference interval change**   ??? GLOBULIN, TOTAL 10/12/2018 3.1  1.5 - 4.5 g/dL Final   ??? A-G Ratio 10/12/2018 1.4  1.2 - 2.2 Final   ??? Bilirubin, total 10/12/2018 0.5  0.0 - 1.2 mg/dL Final   ??? Alk. phosphatase 10/12/2018 46  39 - 117 IU/L Final   ??? AST (SGOT) 10/12/2018 20  0 - 40 IU/L Final   ??? ALT (SGPT) 10/12/2018 18  0 - 32 IU/L Final   ??? Hemoglobin A1c 10/12/2018 7.1* 4.8 - 5.6 % Final    Comment:          Prediabetes: 5.7 - 6.4           Diabetes: >6.4           Glycemic control for adults with diabetes: <7.0     ??? Estimated average glucose 10/12/2018 157  mg/dL Final   ??? Cholesterol, total 10/12/2018 204* 100 - 199 mg/dL Final   ??? Triglyceride 10/12/2018 82  0 - 149 mg/dL Final   ??? HDL Cholesterol 10/12/2018 78  >39 mg/dL Final   ??? VLDL, calculated 10/12/2018 16  5 - 40 mg/dL Final   ??? LDL, calculated 10/12/2018 110* 0 - 99 mg/dL Final   ??? TSH 10/12/2018 0.536  0.450 - 4.500 uIU/mL Final   ??? VITAMIN D, 25-HYDROXY 10/12/2018 12.0* 30.0 - 100.0 ng/mL Final    Comment: Vitamin D deficiency has been defined by the Institute of  Medicine and an Endocrine Society practice guideline as a  level of serum 25-OH vitamin D less than 20 ng/mL (1,2).  The Endocrine Society went on to further define vitamin D  insufficiency as a level between 21 and 29 ng/mL (2).  1. IOM (Institute of Medicine). 2010. Dietary reference     intakes for calcium and D. [place]: The     [employer].  2. Holick MF, Binkley NC, Bischoff-Ferrari HA, et al.     Evaluation, treatment, and prevention of vitamin D     deficiency: an Endocrine Society clinical practice     guideline. JCEM. 2011 Jul; 96(7):1911-30.     ??? Microalbumin urine (POC) 10/12/2018 20  MG/L Final   Office Visit on 09/05/2018   Component Date Value Ref Range Status   ??? C-Reactive Protein, Qt 09/05/2018 1  0 - 10 mg/L Final   ??? Sed rate (ESR) 09/05/2018 63* 0 - 40 mm/hr Final          The results above were reviewed and discussed with patient.         Assessment/Plan:   Sarah Chavez is a 77 y.o. female who presents with:     1. PMR (polymyalgia rheumatica) ([place]): Starting tomorrow I did instruct the patient to decrease the prednisone dose down to 6 mg daily which she will remain on for the next 3 weeks following which she will come down to 5 mg of prednisone daily and she will remain on that dose until her follow-up visit with me.  If she does have recurrence of temporal headaches or jaw  pain with double vision or blurring of vision she was instructed to call me or go to the nearest ER ASAP.  -     predniSONE (DELTASONE) 5 mg tablet; Take 1 pill once a day after breakfast.  -     predniSONE (DELTASONE) 1 mg tablet; Take 1 pill once a day after breakfast for 3 weeks and then stop.  -     C REACTIVE PROTEIN, QT  -     SED RATE (ESR)    Disease activity plan:  As stated above.    Steroid management plan:  As stated above, if applicable.    Pain management plan:  As stated above, if applicable.    Weight management plan:  Weight loss through diet and exercise is always encouraged    Disease prognosis: Good        I appreciate the opportunity to continue to participate in the care of this patient.     Follow-up and Dispositions    ?? Return in about 6 weeks (around 11/29/2018).       Electronically signed by:  Jeffie Pollock, MD      This note was dictated using dragon voice recognition software.  It has been proofread, but there may still exist voice recognition errors that the author did not detect.                --------------------------------------------------------------------------------------------------------------------------------------------------------------------------------------------------------------------------------

## 2018-10-19 LAB — C REACTIVE PROTEIN, QT: C-Reactive Protein, Qt: 2 mg/L (ref 0–10)

## 2018-10-19 LAB — SED RATE (ESR): Sed rate (ESR): 40 mm/hr (ref 0–40)

## 2018-10-19 LAB — C-REACTIVE PROTEIN: CRP: 2 mg/L (ref 0–10)

## 2018-10-19 LAB — SEDIMENTATION RATE: Sed Rate: 40 mm/hr (ref 0–40)

## 2018-11-16 ENCOUNTER — Ambulatory Visit: Attending: Family Medicine | Primary: Family Medicine

## 2018-11-16 ENCOUNTER — Encounter: Attending: Family Medicine | Primary: Family Medicine

## 2018-11-16 ENCOUNTER — Ambulatory Visit: Admit: 2018-11-16 | Discharge: 2018-11-16 | Payer: MEDICARE | Attending: Family Medicine | Primary: Family Medicine

## 2018-11-16 DIAGNOSIS — E119 Type 2 diabetes mellitus without complications: Secondary | ICD-10-CM

## 2018-11-16 MED ORDER — GLIPIZIDE SR 2.5 MG 24 HR TAB
2.5 mg | ORAL_TABLET | Freq: Every day | ORAL | 5 refills | Status: DC
Start: 2018-11-16 — End: 2019-07-05

## 2018-11-16 MED ORDER — CHOLECALCIFEROL (VITAMIN D3) 2,000 UNIT CAPSULE
ORAL_CAPSULE | ORAL | 5 refills | Status: DC
Start: 2018-11-16 — End: 2019-07-05

## 2018-11-16 NOTE — Progress Notes (Signed)
SUBJECTIVE:   Sarah Chavez is a 78 y.o. female who has a  past medical history significant for normocytic anemia felt to be anemia of chronic disease, PMR, diabetes, hypertension, high cholesterol, CAD, vitamin D deficiency and obesity.  Patient reports no chest pain, shortness of breath, orthopnea or PND.  GI and GU review of systems is unremarkable.  She does report that she has been intermittently having some palpitations.  She states that this is a longstanding problem for her but seems to be becoming more frequent.  These are not associated with exertion and occur randomly.  They are not associated with any shortness of breath.  Patient states that she has made an appointment with her cardiologist.  Current medicines are listed in the EMR and reviewed today.    HPI  See above    Past Medical History, Past Surgical History, Family history, Social History, and Medications were all reviewed with the patient today and updated as necessary.       Current Outpatient Medications   Medication Sig Dispense Refill   ??? glipiZIDE SR (GLUCOTROL XL) 2.5 mg CR tablet Take 1 Tab by mouth daily. 30 Tab 5   ??? cholecalciferol (VITAMIN D3) (2,000 UNITS /50 MCG) cap capsule 1 tablet once a day 30 Cap 5   ??? predniSONE (DELTASONE) 5 mg tablet Take 1 pill once a day after breakfast. 30 Tab 1   ??? pravastatin (PRAVACHOL) 80 mg tablet Take 1 Tab by mouth daily. 90 Tab 3   ??? lisinopril-hydroCHLOROthiazide (PRINZIDE, ZESTORETIC) 20-12.5 mg per tablet Take 1 Tab by mouth daily. 90 Tab 3   ??? famotidine (PEPCID) 20 mg tablet take 1 tablet by mouth once daily 90 Tab 3   ??? folic acid 400 mcg tablet Take 400 mcg by mouth daily.     ??? nitroglycerin (NITROSTAT) 0.4 mg SL tablet Place 1 sl under the tongue q 5 min prn cp, max 3 sl in a 15-min time period. Call 911 if no relief after the 3rd sl. 1 Bottle prn   ??? aspirin 81 mg chewable tablet Take 1 Tab by mouth daily. 30 Tab 11     Allergies   Allergen Reactions   ??? Ativan [Lorazepam]  Other (comments)     Made her feel crazy and chest pressure   ??? Ativan [Lorazepam] Other (comments)     Chest fullness   ??? Other Medication Palpitations     Some type of decongestant   ??? Other Medication Other (comments)     Doesn't take decongestants, but can't recall why     Patient Active Problem List   Diagnosis Code   ??? HTN (hypertension) I10   ??? Diabetes mellitus type 2, controlled (HCC) E11.9   ??? Coronary artery disease involving native coronary artery of native heart without angina pectoris I25.10   ??? Dyslipidemia E78.5   ??? OSA on CPAP G47.33, Z99.89   ??? Severe obesity (HCC) E66.01   ??? Elevated sed rate R70.0   ??? Normocytic anemia D64.9     Past Medical History:   Diagnosis Date   ??? Abdominal pain 07/22/2015   ??? Anemia    ??? Back pain    ??? Bursitis of shoulder    ??? CAD (coronary artery disease)    ??? Cerumen impaction    ??? Chest pain, atypical 07/22/2015   ??? Diabetes (HCC)     no meds, A1c 09/28/17 6.6; avg bs 105; denies ss of hypo   ??? Diabetes  mellitus out of control [facility])    ??? Endocrine disease     thyroid   ??? Fatigue    ??? GERD (gastroesophageal reflux disease)     controlled with medicaitons   ??? Hip pain, right    ??? Hyperlipidemia, mild    ??? Hypertension     managed with medication   ??? Hypertension associated with diabetes (HCC) 07/22/2015   ??? 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 Left carotid bruit    ??? Lumbago 07/22/2015   ??? Morbid obesity (HCC) 07/22/2015    BMI 35.3   ??? OSA on CPAP 07/22/2015    uses CPAP   ??? Other ill-defined conditions(799.89)     cholesterol   ??? Other ill-defined conditions(799.89)     heart cath 96   ??? Shoulder pain, acute    ??? Shoulder pain, bilateral    ??? Sinusitis, acute maxillary    ??? Unstable angina (HCC) 07/06/2015   ??? Vitamin D deficiency    ??? Weakness      Past Surgical History:   Procedure Laterality Date   ??? COLONOSCOPY N/A 03/01/2018    COLONOSCOPY/BMI 39 performed by Mazanec, Worthy Rancher, MD at [facility] ENDOSCOPY   ??? HX COLONOSCOPY     ??? HX HEART  CATHETERIZATION  2016    without intervention   ??? HX HEENT      goiter/thyroid surgery   ??? HX THYROIDECTOMY       Family History   Problem Relation Age of Onset   ??? Stroke Sister    ??? Cancer Sister         cervical   ??? Stroke Brother    ??? Diabetes Other    ??? Hypertension Other      Social History     Tobacco Use   ??? Smoking status: Former Smoker     Packs/day: 0.50     Years: 10.00     Pack years: 5.00     Last attempt to quit: 1971     Years since quitting: 49.2   ??? Smokeless tobacco: Never Used   ??? Tobacco comment: quit in 1971   Substance Use Topics   ??? Alcohol use: No         Review of Systems  See above    OBJECTIVE:  Visit Vitals  BP 132/80 (BP 1 Location: Left arm, BP Patient Position: Sitting)   Ht 4\' 11"  (1.499 m)   Wt 196 lb (88.9 kg)   BMI 39.59 kg/m??        Physical Exam  Constitutional:       Appearance: She is well-developed. She is obese.   HENT:      Head: Normocephalic and atraumatic.   Eyes:      Pupils: Pupils are equal, round, and reactive to light.   Neck:      Musculoskeletal: Normal range of motion and neck supple.      Thyroid: No thyromegaly.      Vascular: No JVD.   Cardiovascular:      Rate and Rhythm: Normal rate and regular rhythm.      Heart sounds: Normal heart sounds. No murmur. No friction rub. No gallop.    Pulmonary:      Effort: Pulmonary effort is normal. No respiratory distress.      Breath sounds: No wheezing or rales.   Abdominal:      Palpations: Abdomen is soft.      Tenderness: There is no  abdominal tenderness. There is no guarding or rebound.   Musculoskeletal: Normal range of motion.   Skin:     Findings: No rash.   Neurological:      Mental Status: She is alert and oriented to person, place, and time.         Medical problems and test results were reviewed with the patient today.         ASSESSMENT and PLAN    1.  Diabetes.  A1c is 7.1.  Previously 6.0.  Patient is on long-term prednisone for her polymyalgia rheumatica.  I do believe it would be wise to initiate  pharmacologic therapy.  She has been on metformin in the past and did not like it.  Will start glipizide XL 2.5 mg 1 a day with food.  Reevaluate in 4 months.  Discuss hypoglycemia precautions.    2.  High cholesterol.  LDL is 110.  Previously 80.  Patient reports poor dietary habits and admits to eating "the wrong foods".  We will reevaluate in 4 months.  If cholesterol does not show improvement we will consider altering statin therapy.  She is currently on pravastatin 80 mg a day.  Liver enzymes remain normal.    3.  Hypertension.  Blood pressures well maintained.  Continue current therapy.    4.  Vitamin D deficiency.  Vitamin D levels 12.  Previously 25.  Start vitamin D 2000 IUs a day.  Recheck at follow-up in 4 months.    5.  CAD.  Per cardiology.  Palpitations reported but patient states are not new and that she will discuss with her cardiologist.  Thyroid function is normal.  There may be an anxiety component as the patient does report anxiousness.  Encouraged her to minimize caffeine intake.  May need Holter monitoring.    6.  Polymyalgia rheumatica.  Continue follow-up with rheumatology.    7.  Anemia of chronic disease.  Hemoglobin is 13.1.  Continue follow-up with hematology.    8.  Severe obesity.  BMI is 39.5.  Dietary counseling provided.    Elements of this note have been dictated using speech recognition software. As a result, errors of speech recognition may have occurred.

## 2018-11-16 NOTE — Progress Notes (Signed)
SUBJECTIVE:   Sarah Chavez is a 78 y.o. female who has a  past medical history significant for normocytic anemia felt to be anemia of chronic disease, PMR, diabetes, hypertension, high cholesterol, CAD, vitamin D deficiency and obesity.  Patient reports no chest pain, shortness of breath, orthopnea or PND.  GI and GU review of systems is unremarkable.  She does report that she has been intermittently having some palpitations.  She states that this is a longstanding problem for her but seems to be becoming more frequent.  These are not associated with exertion and occur randomly.  They are not associated with any shortness of breath.  Patient states that she has made an appointment with her cardiologist.  Current medicines are listed in the EMR and reviewed today.    HPI  See above    Past Medical History, Past Surgical History, Family history, Social History, and Medications were all reviewed with the patient today and updated as necessary.       Current Outpatient Medications   Medication Sig Dispense Refill   ??? glipiZIDE SR (GLUCOTROL XL) 2.5 mg CR tablet Take 1 Tab by mouth daily. 30 Tab 5   ??? cholecalciferol (VITAMIN D3) (2,000 UNITS /50 MCG) cap capsule 1 tablet once a day 30 Cap 5   ??? predniSONE (DELTASONE) 5 mg tablet Take 1 pill once a day after breakfast. 30 Tab 1   ??? pravastatin (PRAVACHOL) 80 mg tablet Take 1 Tab by mouth daily. 90 Tab 3   ??? lisinopril-hydroCHLOROthiazide (PRINZIDE, ZESTORETIC) 20-12.5 mg per tablet Take 1 Tab by mouth daily. 90 Tab 3   ??? famotidine (PEPCID) 20 mg tablet take 1 tablet by mouth once daily 90 Tab 3   ??? folic acid 400 mcg tablet Take 400 mcg by mouth daily.     ??? nitroglycerin (NITROSTAT) 0.4 mg SL tablet Place 1 sl under the tongue q 5 min prn cp, max 3 sl in a 15-min time period. Call 911 if no relief after the 3rd sl. 1 Bottle prn   ??? aspirin 81 mg chewable tablet Take 1 Tab by mouth daily. 30 Tab 11     Allergies   Allergen Reactions    ??? Ativan [Lorazepam] Other (comments)     Made her feel crazy and chest pressure   ??? Ativan [Lorazepam] Other (comments)     Chest fullness   ??? Other Medication Palpitations     Some type of decongestant   ??? Other Medication Other (comments)     Doesn't take decongestants, but can't recall why     Patient Active Problem List   Diagnosis Code   ??? HTN (hypertension) I10   ??? Diabetes mellitus type 2, controlled (HCC) E11.9   ??? Coronary artery disease involving native coronary artery of native heart without angina pectoris I25.10   ??? Dyslipidemia E78.5   ??? OSA on CPAP G47.33, Z99.89   ??? Severe obesity (HCC) E66.01   ??? Elevated sed rate R70.0   ??? Normocytic anemia D64.9     Past Medical History:   Diagnosis Date   ??? Abdominal pain 07/22/2015   ??? Anemia    ??? Back pain    ??? Bursitis of shoulder    ??? CAD (coronary artery disease)    ??? Cerumen impaction    ??? Chest pain, atypical 07/22/2015   ??? Diabetes (HCC)     no meds, A1c 09/28/17 6.6; avg bs 105; denies ss of hypo   ??? Diabetes  mellitus out of control [facility])    ??? Endocrine disease     thyroid   ??? Fatigue    ??? GERD (gastroesophageal reflux disease)     controlled with medicaitons   ??? Hip pain, right    ??? Hyperlipidemia, mild    ??? Hypertension     managed with medication   ??? Hypertension associated with diabetes (HCC) 07/22/2015   ??? 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 Left carotid bruit    ??? Lumbago 07/22/2015   ??? Morbid obesity (HCC) 07/22/2015    BMI 35.3   ??? OSA on CPAP 07/22/2015    uses CPAP   ??? Other ill-defined conditions(799.89)     cholesterol   ??? Other ill-defined conditions(799.89)     heart cath 96   ??? Shoulder pain, acute    ??? Shoulder pain, bilateral    ??? Sinusitis, acute maxillary    ??? Unstable angina (HCC) 07/06/2015   ??? Vitamin D deficiency    ??? Weakness      Past Surgical History:   Procedure Laterality Date   ??? COLONOSCOPY N/A 03/01/2018    COLONOSCOPY/BMI 39 performed by Mazanec, Worthy Rancher, MD at [facility] ENDOSCOPY   ??? HX COLONOSCOPY      ??? HX HEART CATHETERIZATION  2016    without intervention   ??? HX HEENT      goiter/thyroid surgery   ??? HX THYROIDECTOMY       Family History   Problem Relation Age of Onset   ??? Stroke Sister    ??? Cancer Sister         cervical   ??? Stroke Brother    ??? Diabetes Other    ??? Hypertension Other      Social History     Tobacco Use   ??? Smoking status: Former Smoker     Packs/day: 0.50     Years: 10.00     Pack years: 5.00     Last attempt to quit: 1971     Years since quitting: 49.2   ??? Smokeless tobacco: Never Used   ??? Tobacco comment: quit in 1971   Substance Use Topics   ??? Alcohol use: No         Review of Systems  See above    OBJECTIVE:  Visit Vitals  BP 132/80 (BP 1 Location: Left arm, BP Patient Position: Sitting)   Ht  (1.499 m)   Wt 196 lb (88.9 kg)   BMI 39.59 kg/m??        Physical Exam  Constitutional:       Appearance: She is well-developed. She is obese.   HENT:      Head: Normocephalic and atraumatic.   Eyes:      Pupils: Pupils are equal, round, and reactive to light.   Neck:      Musculoskeletal: Normal range of motion and neck supple.      Thyroid: No thyromegaly.      Vascular: No JVD.   Cardiovascular:      Rate and Rhythm: Normal rate and regular rhythm.      Heart sounds: Normal heart sounds. No murmur. No friction rub. No gallop.    Pulmonary:      Effort: Pulmonary effort is normal. No respiratory distress.      Breath sounds: No wheezing or rales.   Abdominal:      Palpations: Abdomen is soft.      Tenderness: There is no  abdominal tenderness. There is no guarding or rebound.   Musculoskeletal: Normal range of motion.   Skin:     Findings: No rash.   Neurological:      Mental Status: She is alert and oriented to person, place, and time.         Medical problems and test results were reviewed with the patient today.         ASSESSMENT and PLAN    1.  Diabetes.  A1c is 7.1.  Previously 6.0.  Patient is on long-term prednisone for her polymyalgia rheumatica.  I do believe it would be wise  to initiate pharmacologic therapy.  She has been on metformin in the past and did not like it.  Will start glipizide XL 2.5 mg 1 a day with food.  Reevaluate in 4 months.  Discuss hypoglycemia precautions.    2.  High cholesterol.  LDL is 110.  Previously 80.  Patient reports poor dietary habits and admits to eating "the wrong foods".  We will reevaluate in 4 months.  If cholesterol does not show improvement we will consider altering statin therapy.  She is currently on pravastatin 80 mg a day.  Liver enzymes remain normal.    3.  Hypertension.  Blood pressures well maintained.  Continue current therapy.    4.  Vitamin D deficiency.  Vitamin D levels 12.  Previously 25.  Start vitamin D 2000 IUs a day.  Recheck at follow-up in 4 months.    5.  CAD.  Per cardiology.  Palpitations reported but patient states are not new and that she will discuss with her cardiologist.  Thyroid function is normal.  There may be an anxiety component as the patient does report anxiousness.  Encouraged her to minimize caffeine intake.  May need Holter monitoring.    6.  Polymyalgia rheumatica.  Continue follow-up with rheumatology.    7.  Anemia of chronic disease.  Hemoglobin is 13.1.  Continue follow-up with hematology.    8.  Severe obesity.  BMI is 39.5.  Dietary counseling provided.    Elements of this note have been dictated using speech recognition software. As a result, errors of speech recognition may have occurred.

## 2018-12-01 ENCOUNTER — Encounter: Attending: Rheumatology | Primary: Family Medicine

## 2018-12-05 ENCOUNTER — Encounter: Attending: Rheumatology | Primary: Family Medicine

## 2018-12-08 ENCOUNTER — Encounter: Attending: Cardiovascular Disease | Primary: Family Medicine

## 2019-01-23 ENCOUNTER — Encounter: Attending: Cardiovascular Disease | Primary: Family Medicine

## 2019-03-22 ENCOUNTER — Other Ambulatory Visit: Admit: 2019-03-22 | Discharge: 2019-03-22 | Payer: MEDICARE | Primary: Family Medicine

## 2019-03-22 DIAGNOSIS — E559 Vitamin D deficiency, unspecified: Secondary | ICD-10-CM

## 2019-03-23 LAB — METABOLIC PANEL, COMPREHENSIVE
A-G Ratio: 1 — ABNORMAL LOW (ref 1.2–2.2)
ALT (SGPT): 7 IU/L (ref 0–32)
AST (SGOT): 12 IU/L (ref 0–40)
Albumin: 4 g/dL (ref 3.7–4.7)
Alk. phosphatase: 67 IU/L (ref 39–117)
BUN/Creatinine ratio: 13 (ref 12–28)
BUN: 17 mg/dL (ref 8–27)
Bilirubin, total: 0.6 mg/dL (ref 0.0–1.2)
CO2: 23 mmol/L (ref 20–29)
Calcium: 9.4 mg/dL (ref 8.7–10.3)
Chloride: 101 mmol/L (ref 96–106)
Creatinine: 1.28 mg/dL — ABNORMAL HIGH (ref 0.57–1.00)
GFR est AA: 46 mL/min/{1.73_m2} — ABNORMAL LOW (ref >59)
GFR est non-AA: 40 mL/min/{1.73_m2} — ABNORMAL LOW (ref >59)
GLOBULIN, TOTAL: 4.1 g/dL (ref 1.5–4.5)
Glucose: 92 mg/dL (ref 65–99)
Potassium: 4.2 mmol/L (ref 3.5–5.2)
Protein, total: 8.1 g/dL (ref 6.0–8.5)
Sodium: 142 mmol/L (ref 134–144)

## 2019-03-23 LAB — LIPID PANEL
Cholesterol, Total: 148 mg/dL (ref 100–199)
Cholesterol, total: 148 mg/dL (ref 100–199)
HDL Cholesterol: 52 mg/dL (ref >39)
HDL: 52 mg/dL (ref >39)
LDL Calculated: 83 mg/dL (ref 0–99)
LDL, calculated: 83 mg/dL (ref 0–99)
Triglyceride: 65 mg/dL (ref 0–149)
Triglycerides: 65 mg/dL (ref 0–149)
VLDL Cholesterol Calculated: 13 mg/dL (ref 5–40)
VLDL, calculated: 13 mg/dL (ref 5–40)

## 2019-03-23 LAB — HEMOGLOBIN A1C WITH EAG
Estimated average glucose: 140 mg/dL
Hemoglobin A1c: 6.5 % — ABNORMAL HIGH (ref 4.8–5.6)

## 2019-03-23 LAB — VITAMIN D, 25 HYDROXY: VITAMIN D, 25-HYDROXY: 33.7 ng/mL (ref 30.0–100.0)

## 2019-03-23 LAB — COMPREHENSIVE METABOLIC PANEL
ALT: 7 IU/L (ref 0–32)
AST: 12 IU/L (ref 0–40)
Albumin/Globulin Ratio: 1 NA — ABNORMAL LOW (ref 1.2–2.2)
Albumin: 4 g/dL (ref 3.7–4.7)
Alkaline Phosphatase: 67 IU/L (ref 39–117)
BUN: 17 mg/dL (ref 8–27)
Bun/Cre Ratio: 13 NA (ref 12–28)
CO2: 23 mmol/L (ref 20–29)
Calcium: 9.4 mg/dL (ref 8.7–10.3)
Chloride: 101 mmol/L (ref 96–106)
Creatinine: 1.28 mg/dL — ABNORMAL HIGH (ref 0.57–1.00)
EGFR IF NonAfrican American: 40 mL/min/{1.73_m2} — ABNORMAL LOW (ref >59)
GFR African American: 46 mL/min/{1.73_m2} — ABNORMAL LOW (ref >59)
Globulin, Total: 4.1 g/dL (ref 1.5–4.5)
Glucose: 92 mg/dL (ref 65–99)
Potassium: 4.2 mmol/L (ref 3.5–5.2)
Sodium: 142 mmol/L (ref 134–144)
Total Bilirubin: 0.6 mg/dL (ref 0.0–1.2)
Total Protein: 8.1 g/dL (ref 6.0–8.5)

## 2019-03-23 LAB — HEMOGLOBIN A1C W/EAG
Hemoglobin A1C: 6.5 % — ABNORMAL HIGH (ref 4.8–5.6)
eAG: 140 mg/dL

## 2019-03-23 LAB — VITAMIN D 25 HYDROXY: Vit D, 25-Hydroxy: 33.7 ng/mL (ref 30.0–100.0)

## 2019-04-04 ENCOUNTER — Ambulatory Visit: Attending: Family Medicine | Primary: Family Medicine

## 2019-04-04 ENCOUNTER — Ambulatory Visit: Admit: 2019-04-04 | Discharge: 2019-04-04 | Payer: MEDICARE | Attending: Family Medicine | Primary: Family Medicine

## 2019-04-04 DIAGNOSIS — R300 Dysuria: Secondary | ICD-10-CM

## 2019-04-04 LAB — AMB POC COMPLETE CBC,AUTOMATED ENTER
ABS. GRANS (POC): 6.8 10*3/uL (ref 2.0–7.8)
ABS. LYMPHS (POC): 2.2 10*3/uL (ref 0.6–4.1)
GRANULOCYTES (POC): 69.4 % (ref 37.0–92.0)
Granulocytes %, POC: 69.4 % (ref 37.0–92.0)
Granulocytes Abs: 6.8 10*3/uL (ref 2.0–7.8)
HCT (POC): 32.5 % — AB (ref 37.0–51.0)
HGB (POC): 10.3 g/dL — AB (ref 12.0–18.0)
Hematocrit, POC: 32.5 % — AB (ref 37.0–51.0)
Hemoglobin, POC: 10.3 g/dL — AB (ref 12.0–18.0)
LYMPHOCYTES (POC): 22.1 % (ref 10.0–58.5)
Lymphocyte %: 22.1 % (ref 10.0–58.5)
Lymphs Abs: 2.2 10*3/uL (ref 0.6–4.1)
MCH (POC): 26.8 pg (ref 26.0–32.0)
MCH: 26.8 pg (ref 26.0–32.0)
MCHC (POC): 31.7 g/dL (ref 31.0–36.0)
MCHC: 31.7 g/dL (ref 31.0–36.0)
MCV (POC): 84.7 fL (ref 80.0–97.0)
MCV: 84.7 fL (ref 80.0–97.0)
MID% POC: 8.5 % (ref 0.1–24.0)
MPV (POC): 8.1 fL (ref 0.0–49.9)
MPV POC: 8.1 fL (ref 0.0–49.9)
Mid # (POC): 0.8 10*3/uL (ref 0.0–1.8)
Mid Cells %, POC: 8.5 % (ref 0.1–24.0)
Mid Cells Absoulute POC: 0.8 10*3/uL (ref 0.0–1.8)
PLATELET (POC): 236 10*3/uL (ref 140–440)
Platelet Count, POC: 236 10*3/uL (ref 140–440)
RBC (POC): 3.84 10*6/uL — AB (ref 4.20–6.30)
RBC, POC: 3.84 10*6/uL — AB (ref 4.20–6.30)
RDW (POC): 14.1 % (ref 11.5–14.5)
RDW, POC: 14.1 % (ref 11.5–14.5)
WBC (POC): 9.8 10*3/uL (ref 4.1–10.9)
WBC, POC: 9.8 10*3/uL (ref 4.1–10.9)

## 2019-04-04 LAB — AMB POC URINALYSIS DIP STICK AUTO W/ MICRO
Bilirubin (UA POC): NEGATIVE
Bilirubin, Urine, POC: NEGATIVE
Blood (UA POC): NEGATIVE
Blood (UA POC): NEGATIVE
Glucose (UA POC): NEGATIVE
Glucose, Urine, POC: NEGATIVE
Ketones (UA POC): NEGATIVE
Ketones, Urine, POC: NEGATIVE
Leukocyte Esterase, Urine, POC: NEGATIVE
Leukocyte esterase (UA POC): NEGATIVE
Nitrite, Urine, POC: NEGATIVE
Nitrites (UA POC): NEGATIVE
Protein (UA POC): NEGATIVE
Protein, Urine, POC: NEGATIVE
Specific Gravity, Urine, POC: 1.015 NA (ref 1.001–1.035)
Specific gravity (UA POC): 1.015 (ref 1.001–1.035)
Urobilinogen (UA POC): NORMAL (ref 0.2–1)
Urobilinogen, POC: NORMAL (ref 0.2–1)
pH (UA POC): 5.5 (ref 4.6–8.0)
pH, Urine, POC: 5.5 NA (ref 4.6–8.0)

## 2019-04-04 MED ORDER — PANTOPRAZOLE 40 MG TAB, DELAYED RELEASE
40 mg | ORAL_TABLET | Freq: Every day | ORAL | 5 refills | Status: DC
Start: 2019-04-04 — End: 2019-09-11

## 2019-04-04 MED ORDER — TRIMETHOPRIM-SULFAMETHOXAZOLE 160 MG-800 MG TAB
160-800 mg | ORAL_TABLET | Freq: Two times a day (BID) | ORAL | 0 refills | Status: AC
Start: 2019-04-04 — End: 2019-04-11

## 2019-04-04 NOTE — Addendum Note (Signed)
Addended by: Shary Key on: 04/04/2019 04:05 PM     Modules accepted: Orders

## 2019-04-04 NOTE — Progress Notes (Signed)
SUBJECTIVE:   Sarah Chavez is a 78 y.o. female who has a ??past medical history significant for normocytic anemia felt to be anemia of chronic disease, PMR, diabetes, hypertension, high cholesterol, CAD, vitamin D deficiency and obesity.    Patient presents today with several issues and complaints.  Firstly she reports that for about a week she has been experiencing dysuria, lower abdominal discomfort and urgency.  No nausea or vomiting or fevers reported.  She denies chest pain, shortness of breath, cough.  In addition the patient reports that she has been experiencing for several months worsening dysphagia with both solids and liquids.  She reports of breakthrough reflux symptoms despite over-the-counter Pepcid.  She reports no melena or hematochezia.  She reports no hematemesis.  She reports no weight loss or weight gain.    Patient reports that she stopped taking prednisone the end of March.  She was on this for her polymyalgia rheumatica.  She is a poor historian and it is unclear as to whether or not she stopped this on her own or was instructed to stop it by her rheumatologist.  She reports no weakness, difficulty arising from a chair or seated position, headache, visual loss or disturbance, difficulty with ambulation.  Current medicines are listed in the EMR and reviewed today.    HPI  See above    Past Medical History, Past Surgical History, Family history, Social History, and Medications were all reviewed with the patient today and updated as necessary.       Current Outpatient Medications   Medication Sig Dispense Refill   ??? glipiZIDE SR (GLUCOTROL XL) 2.5 mg CR tablet Take 1 Tab by mouth daily. 30 Tab 5   ??? cholecalciferol (VITAMIN D3) (2,000 UNITS /50 MCG) cap capsule 1 tablet once a day 30 Cap 5   ??? pravastatin (PRAVACHOL) 80 mg tablet Take 1 Tab by mouth daily. 90 Tab 3   ??? lisinopril-hydroCHLOROthiazide (PRINZIDE, ZESTORETIC) 20-12.5 mg per tablet Take 1 Tab by mouth daily. 90 Tab 3    ??? famotidine (PEPCID) 20 mg tablet take 1 tablet by mouth once daily 90 Tab 3   ??? folic acid 400 mcg tablet Take 400 mcg by mouth daily.     ??? nitroglycerin (NITROSTAT) 0.4 mg SL tablet Place 1 sl under the tongue q 5 min prn cp, max 3 sl in a 15-min time period. Call 911 if no relief after the 3rd sl. 1 Bottle prn   ??? aspirin 81 mg chewable tablet Take 1 Tab by mouth daily. 30 Tab 11     Allergies   Allergen Reactions   ??? Ativan [Lorazepam] Other (comments)     Made her feel crazy and chest pressure   ??? Ativan [Lorazepam] Other (comments)     Chest fullness   ??? Other Medication Palpitations     Some type of decongestant   ??? Other Medication Other (comments)     Doesn't take decongestants, but can't recall why     Patient Active Problem List   Diagnosis Code   ??? HTN (hypertension) I10   ??? Diabetes mellitus type 2, controlled (HCC) E11.9   ??? Coronary artery disease involving native coronary artery of native heart without angina pectoris I25.10   ??? Dyslipidemia E78.5   ??? OSA on CPAP G47.33, Z99.89   ??? Severe obesity (HCC) E66.01   ??? Elevated sed rate R70.0   ??? Normocytic anemia D64.9     Past Medical History:   Diagnosis Date   ???  Abdominal pain 07/22/2015   ??? Anemia    ??? Back pain    ??? Bursitis of shoulder    ??? CAD (coronary artery disease)    ??? Cerumen impaction    ??? Chest pain, atypical 07/22/2015   ??? Diabetes ([place])     no meds, A1c 09/28/17 6.6; avg bs 105; denies ss of hypo   ??? Diabetes mellitus out of control ([place])    ??? Endocrine disease     thyroid   ??? Fatigue    ??? GERD (gastroesophageal reflux disease)     controlled with medicaitons   ??? Hip pain, right    ??? Hyperlipidemia, mild    ??? Hypertension     managed with medication   ??? Hypertension associated with diabetes ([place]) 07/22/2015   ??? Hypertensive pulmonary vascular disease ([place]) 07/22/2015   ??? Left carotid bruit    ??? Lumbago 07/22/2015   ??? Morbid obesity ([place]) 07/22/2015    BMI 35.3   ??? OSA on CPAP 07/22/2015    uses CPAP    ??? Other ill-defined conditions(799.89)     cholesterol   ??? Other ill-defined conditions(799.89)     heart cath 96   ??? Shoulder pain, acute    ??? Shoulder pain, bilateral    ??? Sinusitis, acute maxillary    ??? Unstable angina ([place]) 07/06/2015   ??? Vitamin D deficiency    ??? Weakness      Past Surgical History:   Procedure Laterality Date   ??? COLONOSCOPY N/A 03/01/2018    COLONOSCOPY/BMI 56 performed by Mazanec, Dorene Ar, MD at [facility] ENDOSCOPY   ??? HX COLONOSCOPY     ??? HX HEART CATHETERIZATION  2016    without intervention   ??? HX HEENT      goiter/thyroid surgery   ??? HX THYROIDECTOMY       Family History   Problem Relation Age of Onset   ??? Stroke Sister    ??? Cancer Sister         cervical   ??? Stroke Brother    ??? Diabetes Other    ??? Hypertension Other      Social History     Tobacco Use   ??? Smoking status: Former Smoker     Packs/day: 0.50     Years: 10.00     Pack years: 5.00     Last attempt to quit: 1971     Years since quitting: 49.6   ??? Smokeless tobacco: Never Used   ??? Tobacco comment: quit in 1971   Substance Use Topics   ??? Alcohol use: No         Review of Systems  See above    OBJECTIVE:  Visit Vitals  BP 146/74   Ht 4\' 11"  (1.499 m)   Wt 190 lb 6.4 oz (86.4 kg)   BMI 38.46 kg/m??        Physical Exam  Constitutional:       Appearance: She is well-developed. She is obese.   HENT:      Head: Normocephalic and atraumatic.   Eyes:      Pupils: Pupils are equal, round, and reactive to light.   Neck:      Musculoskeletal: Normal range of motion and neck supple.      Thyroid: No thyromegaly.      Vascular: No JVD.   Cardiovascular:      Rate and Rhythm: Normal rate and regular rhythm.      Heart  sounds: Normal heart sounds. No murmur. No friction rub. No gallop.    Pulmonary:      Effort: Pulmonary effort is normal. No respiratory distress.      Breath sounds: No wheezing or rales.   Abdominal:      Palpations: Abdomen is soft.      Tenderness: There is no abdominal tenderness. There is no guarding or rebound.    Musculoskeletal: Normal range of motion.   Skin:     Findings: No rash.   Neurological:      Mental Status: She is alert and oriented to person, place, and time.         Medical problems and test results were reviewed with the patient today.         ASSESSMENT and PLAN    1.  Dysuria.  UA is clear.  Symptoms however very consistent with UTI.  Treat empirically with Septra DS twice a day for a week.  Culture urine.  Further recommendations pending.    2.  Lower abdominal pain.  As above.  CBC shows normal white count.  However does show a drop in hemoglobin to 10.3.  Previously 13.  MCV is 84.7.  Refer back to hematology regarding the drop in hemoglobin.    3.  Dysphagia.  Start Protonix 40 mg a day added to her Pepcid.  If persist refer to GI.    4.  Reflux.  As above.  Encourage weight loss and dietary management.    5.  Diabetes.  Last A1c was 7.1.    6.  Hypertension.  Blood pressure is mildly elevated at 146/74.  Recheck a scheduled follow-up.  Previously 132/80.    7.  PMR.  Patient encouraged to follow-up with her rheumatologist.  She states that she will do so.  She is off prednisone.    8.  Anemia of chronic disease.  Refer back to hematology with dropping hemoglobin.  May need to see GI but we will await recommendations of hematology.    Elements of this note have been dictated using speech recognition software. As a result, errors of speech recognition may have occurred.

## 2019-04-04 NOTE — Progress Notes (Signed)
SUBJECTIVE:   Sarah Chavez is a 78 y.o. female who has a ??past medical history significant for normocytic anemia felt to be anemia of chronic disease, PMR, diabetes, hypertension, high cholesterol, CAD, vitamin D deficiency and obesity.    Patient presents today with several issues and complaints.  Firstly she reports that for about a week she has been experiencing dysuria, lower abdominal discomfort and urgency.  No nausea or vomiting or fevers reported.  She denies chest pain, shortness of breath, cough.  In addition the patient reports that she has been experiencing for several months worsening dysphagia with both solids and liquids.  She reports of breakthrough reflux symptoms despite over-the-counter Pepcid.  She reports no melena or hematochezia.  She reports no hematemesis.  She reports no weight loss or weight gain.    Patient reports that she stopped taking prednisone the end of March.  She was on this for her polymyalgia rheumatica.  She is a poor historian and it is unclear as to whether or not she stopped this on her own or was instructed to stop it by her rheumatologist.  She reports no weakness, difficulty arising from a chair or seated position, headache, visual loss or disturbance, difficulty with ambulation.  Current medicines are listed in the EMR and reviewed today.    HPI  See above    Past Medical History, Past Surgical History, Family history, Social History, and Medications were all reviewed with the patient today and updated as necessary.       Current Outpatient Medications   Medication Sig Dispense Refill   ??? glipiZIDE SR (GLUCOTROL XL) 2.5 mg CR tablet Take 1 Tab by mouth daily. 30 Tab 5   ??? cholecalciferol (VITAMIN D3) (2,000 UNITS /50 MCG) cap capsule 1 tablet once a day 30 Cap 5   ??? pravastatin (PRAVACHOL) 80 mg tablet Take 1 Tab by mouth daily. 90 Tab 3   ??? lisinopril-hydroCHLOROthiazide (PRINZIDE, ZESTORETIC) 20-12.5 mg per tablet Take 1 Tab by mouth daily. 90 Tab 3   ???  famotidine (PEPCID) 20 mg tablet take 1 tablet by mouth once daily 90 Tab 3   ??? folic acid 400 mcg tablet Take 400 mcg by mouth daily.     ??? nitroglycerin (NITROSTAT) 0.4 mg SL tablet Place 1 sl under the tongue q 5 min prn cp, max 3 sl in a 15-min time period. Call 911 if no relief after the 3rd sl. 1 Bottle prn   ??? aspirin 81 mg chewable tablet Take 1 Tab by mouth daily. 30 Tab 11     Allergies   Allergen Reactions   ??? Ativan [Lorazepam] Other (comments)     Made her feel crazy and chest pressure   ??? Ativan [Lorazepam] Other (comments)     Chest fullness   ??? Other Medication Palpitations     Some type of decongestant   ??? Other Medication Other (comments)     Doesn't take decongestants, but can't recall why     Patient Active Problem List   Diagnosis Code   ??? HTN (hypertension) I10   ??? Diabetes mellitus type 2, controlled (HCC) E11.9   ??? Coronary artery disease involving native coronary artery of native heart without angina pectoris I25.10   ??? Dyslipidemia E78.5   ??? OSA on CPAP G47.33, Z99.89   ??? Severe obesity (HCC) E66.01   ??? Elevated sed rate R70.0   ??? Normocytic anemia D64.9     Past Medical History:   Diagnosis Date   ???  Abdominal pain 07/22/2015   ??? Anemia    ??? Back pain    ??? Bursitis of shoulder    ??? CAD (coronary artery disease)    ??? Cerumen impaction    ??? Chest pain, atypical 07/22/2015   ??? Diabetes (HCC)     no meds, A1c 09/28/17 6.6; avg bs 105; denies ss of hypo   ??? Diabetes mellitus out of control (HCC)    ??? Endocrine disease     thyroid   ??? Fatigue    ??? GERD (gastroesophageal reflux disease)     controlled with medicaitons   ??? Hip pain, right    ??? Hyperlipidemia, mild    ??? Hypertension     managed with medication   ??? Hypertension associated with diabetes (HCC) 07/22/2015   ??? 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 Left carotid bruit    ??? Lumbago 07/22/2015   ??? Morbid obesity (HCC) 07/22/2015    BMI 35.3   ??? OSA on CPAP 07/22/2015    uses CPAP   ??? Other ill-defined conditions(799.89)      cholesterol   ??? Other ill-defined conditions(799.89)     heart cath 96   ??? Shoulder pain, acute    ??? Shoulder pain, bilateral    ??? Sinusitis, acute maxillary    ??? Unstable angina (HCC) 07/06/2015   ??? Vitamin D deficiency    ??? Weakness      Past Surgical History:   Procedure Laterality Date   ??? COLONOSCOPY N/A 03/01/2018    COLONOSCOPY/BMI 39 performed [facility] Mazanec, Worthy RancherPaul A[facility]   ??? HX COLONOSCOPY     ??? HX HEART CATHETERIZATION  2016    without intervention   ??? HX HEENT      goiter/thyroid surgery   ??? HX THYROIDECTOMY       Family History   Problem Relation Age of Onset   ??? Stroke Sister    ??? Cancer Sister         cervical   ??? Stroke Brother    ??? Diabetes Other    ??? Hypertension Other      Social History     Tobacco Use   ??? Smoking status: Former Smoker     Packs/day: 0.50     Years: 10.00     Pack years: 5.00     Last attempt to quit: 1971     Years since quitting: 49.6   ??? Smokeless tobacco: Never Used   ??? Tobacco comment: quit in 1971   Substance Use Topics   ??? Alcohol use: No         Review of Systems  See above    OBJECTIVE:  Visit Vitals  BP 146/74   Ht 4\' 11"  (1.499 m)   Wt 190 lb 6.4 oz (86.4 kg)   BMI 38.46 kg/m??        Physical Exam  Constitutional:       Appearance: She is well-developed. She is obese.   HENT:      Head: Normocephalic and atraumatic.   Eyes:      Pupils: Pupils are equal, round, and reactive to light.   Neck:      Musculoskeletal: Normal range of motion and neck supple.      Thyroid: No thyromegaly.      Vascular: No JVD.   Cardiovascular:      Rate and Rhythm: Normal rate and regular rhythm.      Heart  sounds: Normal heart sounds. No murmur. No friction rub. No gallop.    Pulmonary:      Effort: Pulmonary effort is normal. No respiratory distress.      Breath sounds: No wheezing or rales.   Abdominal:      Palpations: Abdomen is soft.      Tenderness: There is no abdominal tenderness. There is no guarding or rebound.   Musculoskeletal: Normal range of motion.   Skin:      Findings: No rash.   Neurological:      Mental Status: She is alert and oriented to person, place, and time.         Medical problems and test results were reviewed with the patient today.         ASSESSMENT and PLAN    1.  Dysuria.  UA is clear.  Symptoms however very consistent with UTI.  Treat empirically with Septra DS twice a day for a week.  Culture urine.  Further recommendations pending.    2.  Lower abdominal pain.  As above.  CBC shows normal white count.  However does show a drop in hemoglobin to 10.3.  Previously 13.  MCV is 84.7.  Refer back to hematology regarding the drop in hemoglobin.    3.  Dysphagia.  Start Protonix 40 mg a day added to her Pepcid.  If persist refer to GI.    4.  Reflux.  As above.  Encourage weight loss and dietary management.    5.  Diabetes.  Last A1c was 7.1.    6.  Hypertension.  Blood pressure is mildly elevated at 146/74.  Recheck a scheduled follow-up.  Previously 132/80.    7.  PMR.  Patient encouraged to follow-up with her rheumatologist.  She states that she will do so.  She is off prednisone.    8.  Anemia of chronic disease.  Refer back to hematology with dropping hemoglobin.  May need to see GI but we will await recommendations of hematology.    Elements of this note have been dictated using speech recognition software. As a result, errors of speech recognition may have occurred.

## 2019-04-04 NOTE — Addendum Note (Signed)
Addendum Note  by Shary Key at 04/04/19 1440                Author: Shary Key  Service: --  Author Type: Technician       Filed: 04/04/19 1605  Encounter Date: 04/04/2019  Status: Signed          Editor: Shary Key (Technician)          Addended by: Shary Key on: 04/04/2019 04:05 PM    Modules accepted: Orders

## 2019-04-06 LAB — CULTURE, URINE
Urine Culture, Routine: NO GROWTH
Urine Culture, Routine: NO GROWTH

## 2019-04-07 NOTE — Telephone Encounter (Signed)
Msg received from Dr. Georgina Quint at [facility] requesting f/u appt for pt with Dr. Rose Fillers for normocytic anemia. TE entered and routed to [employer] for scheduling.

## 2019-04-10 NOTE — Telephone Encounter (Signed)
Called patient, no answer no voicemail.

## 2019-04-12 ENCOUNTER — Ambulatory Visit: Attending: Family Medicine | Primary: Family Medicine

## 2019-04-12 ENCOUNTER — Ambulatory Visit: Admit: 2019-04-12 | Discharge: 2019-04-12 | Payer: MEDICARE | Attending: Family Medicine | Primary: Family Medicine

## 2019-04-12 DIAGNOSIS — E119 Type 2 diabetes mellitus without complications: Secondary | ICD-10-CM

## 2019-04-12 MED ORDER — SOLIFENACIN 5 MG TAB
5 mg | ORAL_TABLET | Freq: Every day | ORAL | 5 refills | Status: DC
Start: 2019-04-12 — End: 2019-07-21

## 2019-04-12 NOTE — Progress Notes (Signed)
SUBJECTIVE:   Sarah Chavez is a 78 y.o. female who has a past medical history significant for hypertension, anemia of chronic disease, vitamin D deficiency, high cholesterol, diabetes, obesity.  At previous visit the patient complained of dysuria and urinary frequency.  UA was negative but she was empirically treated with antibiotic.  Dysuria resolved but frequency of urination has persisted.  She describes an urgency to urinate.  This is been ongoing for months she states.    In addition at previous visit she had complained of dysphagia.  Protonix was added to her Pepcid.  This is helped minimally.  This is both solid and liquid dysphagia.  She reports no weight loss, melena or hematochezia, fevers or night sweats.    Patient has been referred back to hematology regarding her anemia of chronic disease.  She reports compliance with her medications which are listed in the EMR and reviewed today.  She reports no chest pain, shortness of breath, orthopnea or PND.    HPI  See above    Past Medical History, Past Surgical History, Family history, Social History, and Medications were all reviewed with the patient today and updated as necessary.       Current Outpatient Medications   Medication Sig Dispense Refill   ??? solifenacin (VESICARE) 5 mg tablet Take 1 Tab by mouth daily. 30 Tab 5   ??? pantoprazole (PROTONIX) 40 mg tablet Take 1 Tab by mouth daily. 30 Tab 5   ??? glipiZIDE SR (GLUCOTROL XL) 2.5 mg CR tablet Take 1 Tab by mouth daily. 30 Tab 5   ??? cholecalciferol (VITAMIN D3) (2,000 UNITS /50 MCG) cap capsule 1 tablet once a day 30 Cap 5   ??? pravastatin (PRAVACHOL) 80 mg tablet Take 1 Tab by mouth daily. 90 Tab 3   ??? lisinopril-hydroCHLOROthiazide (PRINZIDE, ZESTORETIC) 20-12.5 mg per tablet Take 1 Tab by mouth daily. 90 Tab 3   ??? famotidine (PEPCID) 20 mg tablet take 1 tablet by mouth once daily 90 Tab 3   ??? folic acid 400 mcg tablet Take 400 mcg by mouth daily.      ??? nitroglycerin (NITROSTAT) 0.4 mg SL tablet Place 1 sl under the tongue q 5 min prn cp, max 3 sl in a 15-min time period. Call 911 if no relief after the 3rd sl. 1 Bottle prn   ??? aspirin 81 mg chewable tablet Take 1 Tab by mouth daily. 30 Tab 11     Allergies   Allergen Reactions   ??? Ativan [Lorazepam] Other (comments)     Made her feel crazy and chest pressure   ??? Ativan [Lorazepam] Other (comments)     Chest fullness   ??? Other Medication Palpitations     Some type of decongestant   ??? Other Medication Other (comments)     Doesn't take decongestants, but can't recall why     Patient Active Problem List   Diagnosis Code   ??? HTN (hypertension) I10   ??? Diabetes mellitus type 2, controlled (HCC) E11.9   ??? Coronary artery disease involving native coronary artery of native heart without angina pectoris I25.10   ??? Dyslipidemia E78.5   ??? OSA on CPAP G47.33, Z99.89   ??? Severe obesity (HCC) E66.01   ??? Elevated sed rate R70.0   ??? Normocytic anemia D64.9   ??? Vitamin D deficiency E55.9     Past Medical History:   Diagnosis Date   ??? Abdominal pain 07/22/2015   ??? Anemia    ???  Back pain    ??? Bursitis of shoulder    ??? CAD (coronary artery disease)    ??? Cerumen impaction    ??? Chest pain, atypical 07/22/2015   ??? Diabetes (HCC)     no meds, A1c 09/28/17 6.6; avg bs 105; denies ss of hypo   ??? Diabetes mellitus out of control (HCC)    ??? Endocrine disease     thyroid   ??? Fatigue    ??? GERD (gastroesophageal reflux disease)     controlled with medicaitons   ??? Hip pain, right    ??? Hyperlipidemia, mild    ??? Hypertension     managed with medication   ??? Hypertension associated with diabetes (HCC) 07/22/2015   ??? 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 Left carotid bruit    ??? Lumbago 07/22/2015   ??? Morbid obesity (HCC) 07/22/2015    BMI 35.3   ??? OSA on CPAP 07/22/2015    uses CPAP   ??? Other ill-defined conditions(799.89)     cholesterol   ??? Other ill-defined conditions(799.89)     heart cath 96   ??? Shoulder pain, acute     ??? Shoulder pain, bilateral    ??? Sinusitis, acute maxillary    ??? Unstable angina (HCC) 07/06/2015   ??? Vitamin D deficiency    ??? Vitamin D deficiency 04/12/2019   ??? Weakness      Past Surgical History:   Procedure Laterality Date   ??? COLONOSCOPY N/A 03/01/2018    COLON[facility]COPY/BMI 39 [facility]   ??? HX COLONOSCOPY     ??? HX HEART CATHETERIZATION  2016    without intervention   ??? HX HEENT      goiter/thyroid surgery   ??? HX THYROIDECTOMY       Family History   Problem Relation Age of Onset   ??? Stroke Sister    ??? Cancer Sister         cervical   ??? Stroke Brother    ??? Diabetes Other    ??? Hypertension Other      Social History     Tobacco Use   ??? Smoking status: Former Smoker     Packs/day: 0.50     Years: 10.00     Pack years: 5.00     Last attempt to quit: 1971     Years since quitting: 49.6   ??? Smokeless tobacco: Never Used   ??? Tobacco comment: quit in 1971   Substance Use Topics   ??? Alcohol use: No         Review of Systems  See above    OBJECTIVE:  Visit Vitals  BP 116/72   Ht 4\' 11"  (1.499 m)   BMI 38.46 kg/m??        Physical Exam  Constitutional:       Appearance: She is well-developed. She is obese.   HENT:      Head: Normocephalic and atraumatic.   Eyes:      Pupils: Pupils are equal, round, and reactive to light.   Neck:      Musculoskeletal: Normal range of motion and neck supple.      Thyroid: No thyromegaly.      Vascular: No JVD.   Cardiovascular:      Rate and Rhythm: Normal rate and regular rhythm.      Heart sounds: Normal heart sounds. No murmur. No friction rub. No gallop.  Pulmonary:      Effort: Pulmonary effort is normal. No respiratory distress.      Breath sounds: No wheezing or rales.   Abdominal:      Palpations: Abdomen is soft.      Tenderness: There is no abdominal tenderness. There is no guarding or rebound.   Musculoskeletal: Normal range of motion.   Skin:     Findings: No rash.   Neurological:       Mental Status: She is alert and oriented to person, place, and time.         Medical problems and test results were reviewed with the patient today.         ASSESSMENT and PLAN    1.  Diabetes.  A1c is 6.5.  Previously 7.1.  Encourage yearly retinal exams and daily feet exams.    2.  Hypertension.  Blood pressure shows improvement of her previous visit.  Currently 116/72.  Electrolytes are normal.  Renal function shows creatinine of 1.2.    3.  Dysphagia.  Persistent despite combination of Protonix and Pepcid.  Refer back to GI.    4.  Anemia of chronic disease.  Has been referred back to hematology.  Please refer to prior notes for details.    5.  Vitamin D deficiency.  Vitamin D level is 33.  Previously 12.  Continue supplementation.    6.  High cholesterol.  LDL is 80.  Previously 110.  Continue Pravachol 80 mg a day.  Encouraged continued adjustments in diet as well.    7.  Severe obesity.  BMI is 38.4.  Encourage dietary management.    8.  Frequency.  Most likely represents overactive bladder.  Trial of Vesicare 5 mg 1 a day.  She will let me know in a couple of weeks if this is not helping.    9.  Overactive bladder.  As above.  Recent UA was clear and patient failed empiric treatment with antibiotic therapy.      Elements of this note have been dictated using speech recognition software. As a result, errors of speech recognition may have occurred.

## 2019-04-12 NOTE — Progress Notes (Signed)
SUBJECTIVE:   Sarah Chavez is a 78 y.o. female who has a past medical history significant for hypertension, anemia of chronic disease, vitamin D deficiency, high cholesterol, diabetes, obesity.  At previous visit the patient complained of dysuria and urinary frequency.  UA was negative but she was empirically treated with antibiotic.  Dysuria resolved but frequency of urination has persisted.  She describes an urgency to urinate.  This is been ongoing for months she states.    In addition at previous visit she had complained of dysphagia.  Protonix was added to her Pepcid.  This is helped minimally.  This is both solid and liquid dysphagia.  She reports no weight loss, melena or hematochezia, fevers or night sweats.    Patient has been referred back to hematology regarding her anemia of chronic disease.  She reports compliance with her medications which are listed in the EMR and reviewed today.  She reports no chest pain, shortness of breath, orthopnea or PND.    HPI  See above    Past Medical History, Past Surgical History, Family history, Social History, and Medications were all reviewed with the patient today and updated as necessary.       Current Outpatient Medications   Medication Sig Dispense Refill   ??? solifenacin (VESICARE) 5 mg tablet Take 1 Tab by mouth daily. 30 Tab 5   ??? pantoprazole (PROTONIX) 40 mg tablet Take 1 Tab by mouth daily. 30 Tab 5   ??? glipiZIDE SR (GLUCOTROL XL) 2.5 mg CR tablet Take 1 Tab by mouth daily. 30 Tab 5   ??? cholecalciferol (VITAMIN D3) (2,000 UNITS /50 MCG) cap capsule 1 tablet once a day 30 Cap 5   ??? pravastatin (PRAVACHOL) 80 mg tablet Take 1 Tab by mouth daily. 90 Tab 3   ??? lisinopril-hydroCHLOROthiazide (PRINZIDE, ZESTORETIC) 20-12.5 mg per tablet Take 1 Tab by mouth daily. 90 Tab 3   ??? famotidine (PEPCID) 20 mg tablet take 1 tablet by mouth once daily 90 Tab 3   ??? folic acid 259 mcg tablet Take 400 mcg by mouth daily.     ??? nitroglycerin (NITROSTAT) 0.4 mg SL tablet  Place 1 sl under the tongue q 5 min prn cp, max 3 sl in a 15-min time period. Call 911 if no relief after the 3rd sl. 1 Bottle prn   ??? aspirin 81 mg chewable tablet Take 1 Tab by mouth daily. 30 Tab 11     Allergies   Allergen Reactions   ??? Ativan [Lorazepam] Other (comments)     Made her feel crazy and chest pressure   ??? Ativan [Lorazepam] Other (comments)     Chest fullness   ??? Other Medication Palpitations     Some type of decongestant   ??? Other Medication Other (comments)     Doesn't take decongestants, but can't recall why     Patient Active Problem List   Diagnosis Code   ??? HTN (hypertension) I10   ??? Diabetes mellitus type 2, controlled ([place]) E11.9   ??? Coronary artery disease involving native coronary artery of native heart without angina pectoris I25.10   ??? Dyslipidemia E78.5   ??? OSA on CPAP G47.33, Z99.89   ??? Severe obesity (HCC) E66.01   ??? Elevated sed rate R70.0   ??? Normocytic anemia D64.9   ??? Vitamin D deficiency E55.9     Past Medical History:   Diagnosis Date   ??? Abdominal pain 07/22/2015   ??? Anemia    ???  Back pain    ??? Bursitis of shoulder    ??? CAD (coronary artery disease)    ??? Cerumen impaction    ??? Chest pain, atypical 07/22/2015   ??? Diabetes (HCC)     no meds, A1c 09/28/17 6.6; avg bs 105; denies ss of hypo   ??? Diabetes mellitus out of control (HCC)    ??? Endocrine disease     thyroid   ??? Fatigue    ??? GERD (gastroesophageal reflux disease)     controlled with medicaitons   ??? Hip pain, right    ??? Hyperlipidemia, mild    ??? Hypertension     managed with medication   ??? Hypertension associated with diabetes (HCC) 07/22/2015   ??? 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 Left carotid bruit    ??? Lumbago 07/22/2015   ??? Morbid obesity (HCC) 07/22/2015    BMI 35.3   ??? OSA on CPAP 07/22/2015    uses CPAP   ??? Other ill-defined conditions(799.89)     cholesterol   ??? Other ill-defined conditions(799.89)     heart cath 96   ??? Shoulder pain, acute    ??? Shoulder pain, bilateral    ??? Sinusitis, acute  maxillary    ??? Unstable angina (HCC) 07/06/2015   ??? Vitamin D deficiency    ??? Vitamin D deficiency 04/12/2019   ??? Weakness      Past Surgical History:   Procedure Laterality Date   ??? COLONOSCOPY N/A 03/01/2018    [facility]/BMI 39 [facility]   ??? HX COLONOSCOPY     ??? HX HEART CATHETERIZATION  2016    without intervention   ??? HX HEENT      goiter/thyroid surgery   ??? HX THYROIDECTOMY       Family History   Problem Relation Age of Onset   ??? Stroke Sister    ??? Cancer Sister         cervical   ??? Stroke Brother    ??? Diabetes Other    ??? Hypertension Other      Social History     Tobacco Use   ??? Smoking status: Former Smoker     Packs/day: 0.50     Years: 10.00     Pack years: 5.00     Last attempt to quit: 1971     Years since quitting: 49.6   ??? Smokeless tobacco: Never Used   ??? Tobacco comment: quit in 1971   Substance Use Topics   ??? Alcohol use: No         Review of Systems  See above    OBJECTIVE:  Visit Vitals  BP 116/72   Ht 4\' 11"  (1.499 m)   BMI 38.46 kg/m??        Physical Exam  Constitutional:       Appearance: She is well-developed. She is obese.   HENT:      Head: Normocephalic and atraumatic.   Eyes:      Pupils: Pupils are equal, round, and reactive to light.   Neck:      Musculoskeletal: Normal range of motion and neck supple.      Thyroid: No thyromegaly.      Vascular: No JVD.   Cardiovascular:      Rate and Rhythm: Normal rate and regular rhythm.      Heart sounds: Normal heart sounds. No murmur. No friction rub. No gallop.  Pulmonary:      Effort: Pulmonary effort is normal. No respiratory distress.      Breath sounds: No wheezing or rales.   Abdominal:      Palpations: Abdomen is soft.      Tenderness: There is no abdominal tenderness. There is no guarding or rebound.   Musculoskeletal: Normal range of motion.   Skin:     Findings: No rash.   Neurological:      Mental Status: She is alert and oriented to person, place, and time.         Medical problems and test  results were reviewed with the patient today.         ASSESSMENT and PLAN    1.  Diabetes.  A1c is 6.5.  Previously 7.1.  Encourage yearly retinal exams and daily feet exams.    2.  Hypertension.  Blood pressure shows improvement of her previous visit.  Currently 116/72.  Electrolytes are normal.  Renal function shows creatinine of 1.2.    3.  Dysphagia.  Persistent despite combination of Protonix and Pepcid.  Refer back to GI.    4.  Anemia of chronic disease.  Has been referred back to hematology.  Please refer to prior notes for details.    5.  Vitamin D deficiency.  Vitamin D level is 33.  Previously 12.  Continue supplementation.    6.  High cholesterol.  LDL is 80.  Previously 110.  Continue Pravachol 80 mg a day.  Encouraged continued adjustments in diet as well.    7.  Severe obesity.  BMI is 38.4.  Encourage dietary management.    8.  Frequency.  Most likely represents overactive bladder.  Trial of Vesicare 5 mg 1 a day.  She will let me know in a couple of weeks if this is not helping.    9.  Overactive bladder.  As above.  Recent UA was clear and patient failed empiric treatment with antibiotic therapy.      Elements of this note have been dictated using speech recognition software. As a result, errors of speech recognition may have occurred.

## 2019-04-24 ENCOUNTER — Encounter: Payer: MEDICARE | Attending: Rheumatology | Primary: Family Medicine

## 2019-04-25 ENCOUNTER — Ambulatory Visit: Attending: Rheumatology | Primary: Family Medicine

## 2019-04-25 ENCOUNTER — Ambulatory Visit: Admit: 2019-04-25 | Discharge: 2019-04-25 | Payer: MEDICARE | Attending: Rheumatology | Primary: Family Medicine

## 2019-04-25 ENCOUNTER — Encounter

## 2019-04-25 DIAGNOSIS — M353 Polymyalgia rheumatica: Secondary | ICD-10-CM

## 2019-04-25 NOTE — Progress Notes (Signed)
[facility] Rheumatology  Jeffie Pollock, M.D.  [address], [place] [address]  Office : [phone], Fax: [phone]     RHEUMATOLOGY OFFICE VISIT NOTE  Date of Visit:  04/25/2019 11:25 AM    Patient Information:  Name:  Sarah Chavez  DOB:  December 18, 1940  Age:  78 y.o.   Gender:  female      Ms. Probert is here today for follow-up of PMR.      Last visit: 04/24/2019      History of Present Illness: On talking to the patient today she states that she has had a low grade fever with no chills. Has not had any cough or congestion either. Has been off the prednisone for 2 months now and has not had any temporal headaches with no jaw pain or double vision since coming off the prednisone.  Her current joint complaints are as mentioned below.    Since the last visit, patient is feeling "fair".      Pain: 8/10  Location:  Some lower back pain with no mid back pain. Some para spinal muscle pain. Bilateral shoulder pain. Some right ankle pain with swelling with occasional buckling with no overlying warmth and redness. Some right knee pain with swelling with occasional buckling with no overlying warmth and redness.   Quality: Deep achy pain.   Modifying Factors:  1st thing in the morning the pain and stiffness is the worst.   Associated Symptoms:  AM Stiffness: 15 min; No tingling, numbness or pain down the arms or legs except for constant tingling and numbness of the right and left thumb and index fingers and intermittent tingling and numbness of the other fingers of both the hands.   Fatigue: 9/10  MDHAQ: 1.3      Last TB screen:10+ years  TB result:negative  ??  Current dose of steroids:prednisone 7 mg??  How long on current dose of steroids:??10 days    How long on continuous steroid therapy:3 - 4 ??months  ??  Past DMARDs, if applicable (methotrexate, plaquenil/hydroxychloroquine, sulfasalazine, Arava/leflunomide):none  ??  Past biologics,??if applicable??(enbrel, humira, simponi, cimzia, [place],  [place], remicade, simponi aria, actemra, rituximab, Leonie Man, cosentyx):none  ??  Past NSAIDs, if applicable (motrin, aleve, naproxen, advil, ibuprofen, celebrex, voltaren/diclofenac, etc.)??ibuprofen.  ??  ??  Last BMD:??2013  Past osteoporosis drugs, if applicable (fosamax, actonel, boniva, reclast, prolia, forteo):none  ??  BMI:37.69  ??  Current exercise regimen, if ZHY:QMVH  Current vitamin D dose:OTC VIT D   Current calcium dose:none  Fractures since last visit, if any:??none      The patient otherwise has no significant interval changes in health or medical history to report.     History Reviewed:    Past Medical History  Past Medical History:   Diagnosis Date   ??? Abdominal pain 07/22/2015   ??? Anemia    ??? Back pain    ??? Bursitis of shoulder    ??? CAD (coronary artery disease)    ??? Cerumen impaction    ??? Chest pain, atypical 07/22/2015   ??? Diabetes ([place])     no meds, A1c 09/28/17 6.6; avg bs 105; denies ss of hypo   ??? Diabetes mellitus out of control ([place])    ??? Endocrine disease     thyroid   ??? Fatigue    ??? GERD (gastroesophageal reflux disease)     controlled with medicaitons   ??? Hip pain, right    ??? Hyperlipidemia, mild    ???  Hypertension     managed with medication   ??? Hypertension associated with diabetes ([place]) 07/22/2015   ??? 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 Left carotid bruit    ??? Lumbago 07/22/2015   ??? Morbid obesity ([place]) 07/22/2015    BMI 35.3   ??? OSA on CPAP 07/22/2015    uses CPAP   ??? Other ill-defined conditions(799.89)     cholesterol   ??? Other ill-defined conditions(799.89)     heart cath 96   ??? Shoulder pain, acute    ??? Shoulder pain, bilateral    ??? Sinusitis, acute maxillary    ??? Unstable angina ([place]) 07/06/2015   ??? Vitamin D deficiency    ??? Vitamin D deficiency 04/12/2019   ??? Weakness        Past Surgical History  Past Surgical History:   Procedure Laterality Date   ??? COLONOSCOPY N/A 03/01/2018    COLONOSCOPY/BMI 39 performed by Mazanec, Dorene Ar, MD at [facility]    ??? HX COLONOSCOPY     ??? HX HEART CATHETERIZATION  2016    without intervention   ??? HX HEENT      goiter/thyroid surgery   ??? HX THYROIDECTOMY         Family History  Family History   Problem Relation Age of Onset   ??? Stroke Sister    ??? Cancer Sister         cervical   ??? Stroke Brother    ??? Diabetes Other    ??? Hypertension Other        Social History  Social History     Socioeconomic History   ??? Marital status: SINGLE     Spouse name: Not on file   ??? Number of children: Not on file   ??? Years of education: Not on file   ??? Highest education level: Not on file   Tobacco Use   ??? Smoking status: Former Smoker     Packs/day: 0.50     Years: 10.00     Pack years: 5.00     Last attempt to quit: 1971     Years since quitting: 49.6   ??? Smokeless tobacco: Never Used   ??? Tobacco comment: quit in 1971   Substance and Sexual Activity   ??? Alcohol use: No   ??? Drug use: No   Social History Narrative    ** Merged History Encounter **                    Allergy:  Allergies   Allergen Reactions   ??? Ativan [Lorazepam] Other (comments)     Made her feel crazy and chest pressure   ??? Ativan [Lorazepam] Other (comments)     Chest fullness   ??? Other Medication Palpitations     Some type of decongestant   ??? Other Medication Other (comments)     Doesn't take decongestants, but can't recall why         Current Medications:  Outpatient Encounter Medications as of 04/25/2019   Medication Sig Dispense Refill   ??? solifenacin (VESICARE) 5 mg tablet Take 1 Tab by mouth daily. 30 Tab 5   ??? pantoprazole (PROTONIX) 40 mg tablet Take 1 Tab by mouth daily. 30 Tab 5   ??? glipiZIDE SR (GLUCOTROL XL) 2.5 mg CR tablet Take 1 Tab by mouth daily. 30 Tab 5   ??? cholecalciferol (VITAMIN D3) (2,000 UNITS /50 MCG) cap capsule 1 tablet once  a day 30 Cap 5   ??? pravastatin (PRAVACHOL) 80 mg tablet Take 1 Tab by mouth daily. 90 Tab 3   ??? lisinopril-hydroCHLOROthiazide (PRINZIDE, ZESTORETIC) 20-12.5 mg per tablet Take 1 Tab by mouth daily. 90 Tab 3    ??? famotidine (PEPCID) 20 mg tablet take 1 tablet by mouth once daily 90 Tab 3   ??? folic acid 258 mcg tablet Take 400 mcg by mouth daily.     ??? nitroglycerin (NITROSTAT) 0.4 mg SL tablet Place 1 sl under the tongue q 5 min prn cp, max 3 sl in a 15-min time period. Call 911 if no relief after the 3rd sl. 1 Bottle prn   ??? aspirin 81 mg chewable tablet Take 1 Tab by mouth daily. 30 Tab 11     No facility-administered encounter medications on file as of 04/25/2019.            REVIEW OF SYSTEMS: The following systems were reviewed with patient today and were negative except for the following (depicted with an "X"):        "X" General  "X" Head and Neck  "X" Heart and Breathing  "X" Gastrointestinal    Fever/chills   Hair loss   Shortness of breath   Upset stomach    Falls   Dry mouth   Coughing   Diarrhea / constipation    Wt loss   Mouth sores   Wheezing   Heartburn    Wt gain   Ringing ears   Chest pain   Dark or bloody stools    Night sweats  x Diff. swallowing  X None of above   Nausea or vomiting   X None of above   None of above     X None of above                "X" Skin  "X" Neurology  "X" Urinary/Gyn  "X" Other    Easy bruising   Numbness/ tingling   Female problems   Depression    Rashes   Weakness  x Problems with urination   Feeling anxious    Sun sensitivity  x Headaches   None of above  x Problems sleeping   X None of above   None of above      None of above          Physical Exam:  Blood pressure 156/76, height '4\' 11"'$  (1.499 m), weight 186 lb 9.6 oz (84.6 kg).  General:  Patient alert, cooperative and in no apparent distress.  HEENT: Pupils equally reactive to light and accommodation, minimal scleral injection noted.  Neck supple, no lymphadenopathy, no thyromegaly.  Heart: Regular rate and rhythm, normal S1 and S2, no rubs or gallops.  Lungs: Clear to auscultation bilaterally.  Abdomen: Soft, minimal epigastric as well as right and left upper quadrant  tenderness with no rebound or guarding.  No hepatosplenomegaly.  Skin:  No rashes. No nail abnormalities.  Neurologic:  Oriented, normal speech and affect.  Normal gait.    Extremities:  No edema in bilateral lower extremities with no cyanosis or clubbing.    Muskoskeletal Exam:     I examined the shoulders, elbows, wrists, MCPs, PIPs, DIPs and knees bilaterally for strength, range of motion, deformity, tenderness, swelling, and synovitis.      The findings are: Does have tenderness on palpation of the shoulders both anteriorly as well as superiorly with intact range of motion to abduction but limited internal rotation  though.  Does have tenderness on palpation of the C-spine as well as the paraspinal muscles of the neck with T-spine tenderness.  No L-spine tenderness with no SI joint tenderness.  Does have tenderness on palpation of the right knee in the mid joint line with associated synovitis but no overlying warmth or redness.  Does have joint crepitus on flexion as well as extension of the right knee.  Does have tenderness on palpation of the right ankle laterally with associated synovitis but no overlying warmth or redness.  Does have synovitis involving the dorsum of the right foot with tenderness on palpation of the right 1st-5th MTP joints with no overlying warmth or redness.    Patient otherwise has a normal joint exam without other evidence of joint tenderness, synovitis, warmth, erythema, decreased ROM, weakness or deformities.     Radiology Reports Reviewed (if available):  Last 3 months  No results found.    Lab Reports Reviewed (if available): Last 3 months    Office Visit on 04/04/2019   Component Date Value Ref Range Status   ??? Color (UA POC) 04/04/2019 Yellow   Final   ??? Clarity (UA POC) 04/04/2019 Clear   Final   ??? Glucose (UA POC) 04/04/2019 Negative  Negative Final   ??? Bilirubin (UA POC) 04/04/2019 Negative  Negative Final   ??? Ketones (UA POC) 04/04/2019 Negative  Negative Final    ??? Specific gravity (UA POC) 04/04/2019 1.015  1.001 - 1.035 Final   ??? Blood (UA POC) 04/04/2019 Negative  Negative Final   ??? pH (UA POC) 04/04/2019 5.5  4.6 - 8.0 Final   ??? Protein (UA POC) 04/04/2019 Negative  Negative Final   ??? Urobilinogen (UA POC) 04/04/2019 normal  0.2 - 1 Final   ??? Nitrites (UA POC) 04/04/2019 Negative  Negative Final   ??? Leukocyte esterase (UA POC) 04/04/2019 Negative  Negative Final   ??? WBC (POC) 04/04/2019 9.8  4.1 - 10.9 10^3/ul Final   ??? ABS. LYMPHS (POC) 04/04/2019 2.2  0.6 - 4.1 10^3/ul Final   ??? Mid # (POC) 04/04/2019 0.8  0.0 - 1.8 10^3/ul Final   ??? ABS. GRANS (POC) 04/04/2019 6.8  2.0 - 7.8 10^3/ul Final   ??? LYMPHOCYTES (POC) 04/04/2019 22.1  10.0 - 58.5 % Final   ??? MID% POC 04/04/2019 8.5  0.1 - 24.0 % Final   ??? GRANULOCYTES (POC) 04/04/2019 69.4  37.0 - 92.0 % Final   ??? RBC (POC) 04/04/2019 3.84* 4.20 - 6.30 10^6/ul Final   ??? HGB (POC) 04/04/2019 10.3* 12.0 - 18.0 g/dL Final   ??? HCT (POC) 04/04/2019 32.5* 37.0 - 51.0 % Final   ??? MCV (POC) 04/04/2019 84.7  80.0 - 97.0 fL Final   ??? MCH (POC) 04/04/2019 26.8  26.0 - 32.0 pg Final   ??? MCHC (POC) 04/04/2019 31.7  31.0 - 36.0 g/dL Final   ??? RDW (POC) 04/04/2019 14.1  11.5 - 14.5 % Final   ??? PLATELET (POC) 04/04/2019 236  140 - 440 10^3/ul Final   ??? MPV (POC) 04/04/2019 8.1  0.0 - 49.9 fL Final   ??? Urine Culture, Routine 04/04/2019 No growth   Final   Lab Only on 03/22/2019   Component Date Value Ref Range Status   ??? VITAMIN D, 25-HYDROXY 03/22/2019 33.7  30.0 - 100.0 ng/mL Final    Comment: Vitamin D deficiency has been defined by the [place] practice guideline as a  level of  serum 25-OH vitamin D less than 20 ng/mL (1,2).  The Endocrine Society went on to further define vitamin D  insufficiency as a level between 21 and 29 ng/mL (2).  1. IOM (Institute of Medicine). 2010. Dietary reference     intakes for calcium and D. [place]: The     [employer].   2. Holick MF, Binkley NC, Bischoff-Ferrari HA, et al.     Evaluation, treatment, and prevention of vitamin D     deficiency: an Endocrine Society clinical practice     guideline. JCEM. 2011 Jul; 96(7):1911-30.     ??? Cholesterol, total 03/22/2019 148  100 - 199 mg/dL Final   ??? Triglyceride 03/22/2019 65  0 - 149 mg/dL Final   ??? HDL Cholesterol 03/22/2019 52  >39 mg/dL Final   ??? VLDL, calculated 03/22/2019 13  5 - 40 mg/dL Final   ??? LDL, calculated 03/22/2019 83  0 - 99 mg/dL Final   ??? Hemoglobin A1c 03/22/2019 6.5* 4.8 - 5.6 % Final    Comment:          Prediabetes: 5.7 - 6.4           Diabetes: >6.4           Glycemic control for adults with diabetes: <7.0     ??? Estimated average glucose 03/22/2019 140  mg/dL Final   ??? Glucose 03/22/2019 92  65 - 99 mg/dL Final   ??? BUN 03/22/2019 17  8 - 27 mg/dL Final   ??? Creatinine 03/22/2019 1.28* 0.57 - 1.00 mg/dL Final   ??? GFR est non-AA 03/22/2019 40* >59 mL/min/1.73 Final   ??? GFR est AA 03/22/2019 46* >59 mL/min/1.73 Final   ??? BUN/Creatinine ratio 03/22/2019 13  12 - 28 Final   ??? Sodium 03/22/2019 142  134 - 144 mmol/L Final   ??? Potassium 03/22/2019 4.2  3.5 - 5.2 mmol/L Final   ??? Chloride 03/22/2019 101  96 - 106 mmol/L Final   ??? CO2 03/22/2019 23  20 - 29 mmol/L Final   ??? Calcium 03/22/2019 9.4  8.7 - 10.3 mg/dL Final   ??? Protein, total 03/22/2019 8.1  6.0 - 8.5 g/dL Final   ??? Albumin 03/22/2019 4.0  3.7 - 4.7 g/dL Final   ??? GLOBULIN, TOTAL 03/22/2019 4.1  1.5 - 4.5 g/dL Final   ??? A-G Ratio 03/22/2019 1.0* 1.2 - 2.2 Final   ??? Bilirubin, total 03/22/2019 0.6  0.0 - 1.2 mg/dL Final   ??? Alk. phosphatase 03/22/2019 67  39 - 117 IU/L Final   ??? AST (SGOT) 03/22/2019 12  0 - 40 IU/L Final   ??? ALT (SGPT) 03/22/2019 7  0 - 32 IU/L Final         The results above were reviewed and discussed with patient.         Assessment/Plan:   Shadana Pry is a 78 y.o. female who presents with:     1. PMR (polymyalgia rheumatica) ([place]): Was instructed to remain off the  prednisone for now.  If she does have recurrence of temporal headaches, jaw pain and double vision she was instructed to call me ASAP or go to the nearest ER. If there is any noted abnormality I will keep the patient informed but if not I will review her labs with her on follow-up.  -     C REACTIVE PROTEIN, QT  -     SED RATE (ESR)    Disease activity plan:  As stated above.    Steroid management plan:  As stated above, if applicable.    Pain management plan:  As stated above, if applicable.    Weight management plan:  Weight loss through diet and exercise is always encouraged    Disease prognosis: Good        I appreciate the opportunity to continue to participate in the care of this patient.     Follow-up and Dispositions    ?? Return in about 3 months (around 07/26/2019).       Electronically signed by:  Jeffie Pollock, MD      This note was dictated using dragon voice recognition software.  It has been proofread, but there may still exist voice recognition errors that the author did not detect.                --------------------------------------------------------------------------------------------------------------------------------------------------------------------------------------------------------------------------------

## 2019-04-25 NOTE — Progress Notes (Signed)
[facility] Rheumatology  Jeffie Pollock, M.D.  [address], [place] [address]  Office : [phone], Fax: [phone]     RHEUMATOLOGY OFFICE VISIT NOTE  Date of Visit:  04/25/2019 11:25 AM    Patient Information:  Name:  Sarah Chavez  DOB:  December 18, 1940  Age:  78 y.o.   Gender:  female      Sarah Chavez is here today for follow-up of PMR.      Last visit: 04/24/2019      History of Present Illness: On talking to the patient today she states that she has had a low grade fever with no chills. Has not had any cough or congestion either. Has been off the prednisone for 2 months now and has not had any temporal headaches with no jaw pain or double vision since coming off the prednisone.  Her current joint complaints are as mentioned below.    Since the last visit, patient is feeling "fair".      Pain: 8/10  Location:  Some lower back pain with no mid back pain. Some para spinal muscle pain. Bilateral shoulder pain. Some right ankle pain with swelling with occasional buckling with no overlying warmth and redness. Some right knee pain with swelling with occasional buckling with no overlying warmth and redness.   Quality: Deep achy pain.   Modifying Factors:  1st thing in the morning the pain and stiffness is the worst.   Associated Symptoms:  AM Stiffness: 15 min; No tingling, numbness or pain down the arms or legs except for constant tingling and numbness of the right and left thumb and index fingers and intermittent tingling and numbness of the other fingers of both the hands.   Fatigue: 9/10  MDHAQ: 1.3      Last TB screen:10+ years  TB result:negative  ??  Current dose of steroids:prednisone 7 mg??  How long on current dose of steroids:??10 days    How long on continuous steroid therapy:3 - 4 ??months  ??  Past DMARDs, if applicable (methotrexate, plaquenil/hydroxychloroquine, sulfasalazine, Arava/leflunomide):none  ??  Past biologics,??if applicable??(enbrel, humira, simponi, cimzia, [place],  [place], remicade, simponi aria, actemra, rituximab, Leonie Man, cosentyx):none  ??  Past NSAIDs, if applicable (motrin, aleve, naproxen, advil, ibuprofen, celebrex, voltaren/diclofenac, etc.)??ibuprofen.  ??  ??  Last BMD:??2013  Past osteoporosis drugs, if applicable (fosamax, actonel, boniva, reclast, prolia, forteo):none  ??  BMI:37.69  ??  Current exercise regimen, if ZHY:QMVH  Current vitamin D dose:OTC VIT D   Current calcium dose:none  Fractures since last visit, if any:??none      The patient otherwise has no significant interval changes in health or medical history to report.     History Reviewed:    Past Medical History  Past Medical History:   Diagnosis Date   ??? Abdominal pain 07/22/2015   ??? Anemia    ??? Back pain    ??? Bursitis of shoulder    ??? CAD (coronary artery disease)    ??? Cerumen impaction    ??? Chest pain, atypical 07/22/2015   ??? Diabetes ([place])     no meds, A1c 09/28/17 6.6; avg bs 105; denies ss of hypo   ??? Diabetes mellitus out of control ([place])    ??? Endocrine disease     thyroid   ??? Fatigue    ??? GERD (gastroesophageal reflux disease)     controlled with medicaitons   ??? Hip pain, right    ??? Hyperlipidemia, mild    ???  Hypertension     managed with medication   ??? Hypertension associated with diabetes ([place]) 07/22/2015   ??? 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 Left carotid bruit    ??? Lumbago 07/22/2015   ??? Morbid obesity ([place]) 07/22/2015    BMI 35.3   ??? OSA on CPAP 07/22/2015    uses CPAP   ??? Other ill-defined conditions(799.89)     cholesterol   ??? Other ill-defined conditions(799.89)     heart cath 96   ??? Shoulder pain, acute    ??? Shoulder pain, bilateral    ??? Sinusitis, acute maxillary    ??? Unstable angina ([place]) 07/06/2015   ??? Vitamin D deficiency    ??? Vitamin D deficiency 04/12/2019   ??? Weakness        Past Surgical History  Past Surgical History:   Procedure Laterality Date   ??? COLONOSCOPY N/A 03/01/2018    COLONOSCOPY/BMI 39 performed by Mazanec, Dorene Ar, MD at [facility] ENDOSCOPY   ??? HX  COLONOSCOPY     ??? HX HEART CATHETERIZATION  2016    without intervention   ??? HX HEENT      goiter/thyroid surgery   ??? HX THYROIDECTOMY         Family History  Family History   Problem Relation Age of Onset   ??? Stroke Sister    ??? Cancer Sister         cervical   ??? Stroke Brother    ??? Diabetes Other    ??? Hypertension Other        Social History  Social History     Socioeconomic History   ??? Marital status: SINGLE     Spouse name: Not on file   ??? Number of children: Not on file   ??? Years of education: Not on file   ??? Highest education level: Not on file   Tobacco Use   ??? Smoking status: Former Smoker     Packs/day: 0.50     Years: 10.00     Pack years: 5.00     Last attempt to quit: 1971     Years since quitting: 49.6   ??? Smokeless tobacco: Never Used   ??? Tobacco comment: quit in 1971   Substance and Sexual Activity   ??? Alcohol use: No   ??? Drug use: No   Social History Narrative    ** Merged History Encounter **                    Allergy:  Allergies   Allergen Reactions   ??? Ativan [Lorazepam] Other (comments)     Made her feel crazy and chest pressure   ??? Ativan [Lorazepam] Other (comments)     Chest fullness   ??? Other Medication Palpitations     Some type of decongestant   ??? Other Medication Other (comments)     Doesn't take decongestants, but can't recall why         Current Medications:  Outpatient Encounter Medications as of 04/25/2019   Medication Sig Dispense Refill   ??? solifenacin (VESICARE) 5 mg tablet Take 1 Tab by mouth daily. 30 Tab 5   ??? pantoprazole (PROTONIX) 40 mg tablet Take 1 Tab by mouth daily. 30 Tab 5   ??? glipiZIDE SR (GLUCOTROL XL) 2.5 mg CR tablet Take 1 Tab by mouth daily. 30 Tab 5   ??? cholecalciferol (VITAMIN D3) (2,000 UNITS /50 MCG) cap capsule 1 tablet once  a day 30 Cap 5   ??? pravastatin (PRAVACHOL) 80 mg tablet Take 1 Tab by mouth daily. 90 Tab 3   ??? lisinopril-hydroCHLOROthiazide (PRINZIDE, ZESTORETIC) 20-12.5 mg per tablet Take 1 Tab by mouth daily. 90 Tab 3   ??? famotidine (PEPCID) 20 mg  tablet take 1 tablet by mouth once daily 90 Tab 3   ??? folic acid 240 mcg tablet Take 400 mcg by mouth daily.     ??? nitroglycerin (NITROSTAT) 0.4 mg SL tablet Place 1 sl under the tongue q 5 min prn cp, max 3 sl in a 15-min time period. Call 911 if no relief after the 3rd sl. 1 Bottle prn   ??? aspirin 81 mg chewable tablet Take 1 Tab by mouth daily. 30 Tab 11     No facility-administered encounter medications on file as of 04/25/2019.            REVIEW OF SYSTEMS: The following systems were reviewed with patient today and were negative except for the following (depicted with an "X"):        "X" General  "X" Head and Neck  "X" Heart and Breathing  "X" Gastrointestinal    Fever/chills   Hair loss   Shortness of breath   Upset stomach    Falls   Dry mouth   Coughing   Diarrhea / constipation    Wt loss   Mouth sores   Wheezing   Heartburn    Wt gain   Ringing ears   Chest pain   Dark or bloody stools    Night sweats  x Diff. swallowing  X None of above   Nausea or vomiting   X None of above   None of above     X None of above                "X" Skin  "X" Neurology  "X" Urinary/Gyn  "X" Other    Easy bruising   Numbness/ tingling   Female problems   Depression    Rashes   Weakness  x Problems with urination   Feeling anxious    Sun sensitivity  x Headaches   None of above  x Problems sleeping   X None of above   None of above      None of above          Physical Exam:  Blood pressure 156/76, height 4' 11"  (1.499 m), weight 186 lb 9.6 oz (84.6 kg).  General:  Patient alert, cooperative and in no apparent distress.  HEENT: Pupils equally reactive to light and accommodation, minimal scleral injection noted.  Neck supple, no lymphadenopathy, no thyromegaly.  Heart: Regular rate and rhythm, normal S1 and S2, no rubs or gallops.  Lungs: Clear to auscultation bilaterally.  Abdomen: Soft, minimal epigastric as well as right and left upper quadrant tenderness with no rebound or guarding.  No hepatosplenomegaly.  Skin:  No rashes.  No nail abnormalities.  Neurologic:  Oriented, normal speech and affect.  Normal gait.    Extremities:  No edema in bilateral lower extremities with no cyanosis or clubbing.    Muskoskeletal Exam:     I examined the shoulders, elbows, wrists, MCPs, PIPs, DIPs and knees bilaterally for strength, range of motion, deformity, tenderness, swelling, and synovitis.      The findings are: Does have tenderness on palpation of the shoulders both anteriorly as well as superiorly with intact range of motion to abduction but limited internal rotation  though.  Does have tenderness on palpation of the C-spine as well as the paraspinal muscles of the neck with T-spine tenderness.  No L-spine tenderness with no SI joint tenderness.  Does have tenderness on palpation of the right knee in the mid joint line with associated synovitis but no overlying warmth or redness.  Does have joint crepitus on flexion as well as extension of the right knee.  Does have tenderness on palpation of the right ankle laterally with associated synovitis but no overlying warmth or redness.  Does have synovitis involving the dorsum of the right foot with tenderness on palpation of the right 1st-5th MTP joints with no overlying warmth or redness.    Patient otherwise has a normal joint exam without other evidence of joint tenderness, synovitis, warmth, erythema, decreased ROM, weakness or deformities.     Radiology Reports Reviewed (if available):  Last 3 months  No results found.    Lab Reports Reviewed (if available): Last 3 months    Office Visit on 04/04/2019   Component Date Value Ref Range Status   ??? Color (UA POC) 04/04/2019 Yellow   Final   ??? Clarity (UA POC) 04/04/2019 Clear   Final   ??? Glucose (UA POC) 04/04/2019 Negative  Negative Final   ??? Bilirubin (UA POC) 04/04/2019 Negative  Negative Final   ??? Ketones (UA POC) 04/04/2019 Negative  Negative Final   ??? Specific gravity (UA POC) 04/04/2019 1.015  1.001 - 1.035 Final   ??? Blood (UA POC) 04/04/2019  Negative  Negative Final   ??? pH (UA POC) 04/04/2019 5.5  4.6 - 8.0 Final   ??? Protein (UA POC) 04/04/2019 Negative  Negative Final   ??? Urobilinogen (UA POC) 04/04/2019 normal  0.2 - 1 Final   ??? Nitrites (UA POC) 04/04/2019 Negative  Negative Final   ??? Leukocyte esterase (UA POC) 04/04/2019 Negative  Negative Final   ??? WBC (POC) 04/04/2019 9.8  4.1 - 10.9 10^3/ul Final   ??? ABS. LYMPHS (POC) 04/04/2019 2.2  0.6 - 4.1 10^3/ul Final   ??? Mid # (POC) 04/04/2019 0.8  0.0 - 1.8 10^3/ul Final   ??? ABS. GRANS (POC) 04/04/2019 6.8  2.0 - 7.8 10^3/ul Final   ??? LYMPHOCYTES (POC) 04/04/2019 22.1  10.0 - 58.5 % Final   ??? MID% POC 04/04/2019 8.5  0.1 - 24.0 % Final   ??? GRANULOCYTES (POC) 04/04/2019 69.4  37.0 - 92.0 % Final   ??? RBC (POC) 04/04/2019 3.84* 4.20 - 6.30 10^6/ul Final   ??? HGB (POC) 04/04/2019 10.3* 12.0 - 18.0 g/dL Final   ??? HCT (POC) 04/04/2019 32.5* 37.0 - 51.0 % Final   ??? MCV (POC) 04/04/2019 84.7  80.0 - 97.0 fL Final   ??? MCH (POC) 04/04/2019 26.8  26.0 - 32.0 pg Final   ??? MCHC (POC) 04/04/2019 31.7  31.0 - 36.0 g/dL Final   ??? RDW (POC) 04/04/2019 14.1  11.5 - 14.5 % Final   ??? PLATELET (POC) 04/04/2019 236  140 - 440 10^3/ul Final   ??? MPV (POC) 04/04/2019 8.1  0.0 - 49.9 fL Final   ??? Urine Culture, Routine 04/04/2019 No growth   Final   Lab Only on 03/22/2019   Component Date Value Ref Range Status   ??? VITAMIN D, 25-HYDROXY 03/22/2019 33.7  30.0 - 100.0 ng/mL Final    Comment: Vitamin D deficiency has been defined by the [place] practice guideline as a  level of  serum 25-OH vitamin D less than 20 ng/mL (1,2).  The Endocrine Society went on to further define vitamin D  insufficiency as a level between 21 and 29 ng/mL (2).  1. IOM (Institute of Medicine). 2010. Dietary reference     intakes for calcium and D. [place]: The     [employer].  2. Holick MF, Binkley NC, Bischoff-Ferrari HA, et al.     Evaluation, treatment, and prevention of vitamin D     deficiency:  an Endocrine Society clinical practice     guideline. JCEM. 2011 Jul; 96(7):1911-30.     ??? Cholesterol, total 03/22/2019 148  100 - 199 mg/dL Final   ??? Triglyceride 03/22/2019 65  0 - 149 mg/dL Final   ??? HDL Cholesterol 03/22/2019 52  >39 mg/dL Final   ??? VLDL, calculated 03/22/2019 13  5 - 40 mg/dL Final   ??? LDL, calculated 03/22/2019 83  0 - 99 mg/dL Final   ??? Hemoglobin A1c 03/22/2019 6.5* 4.8 - 5.6 % Final    Comment:          Prediabetes: 5.7 - 6.4           Diabetes: >6.4           Glycemic control for adults with diabetes: <7.0     ??? Estimated average glucose 03/22/2019 140  mg/dL Final   ??? Glucose 03/22/2019 92  65 - 99 mg/dL Final   ??? BUN 03/22/2019 17  8 - 27 mg/dL Final   ??? Creatinine 03/22/2019 1.28* 0.57 - 1.00 mg/dL Final   ??? GFR est non-AA 03/22/2019 40* >59 mL/min/1.73 Final   ??? GFR est AA 03/22/2019 46* >59 mL/min/1.73 Final   ??? BUN/Creatinine ratio 03/22/2019 13  12 - 28 Final   ??? Sodium 03/22/2019 142  134 - 144 mmol/L Final   ??? Potassium 03/22/2019 4.2  3.5 - 5.2 mmol/L Final   ??? Chloride 03/22/2019 101  96 - 106 mmol/L Final   ??? CO2 03/22/2019 23  20 - 29 mmol/L Final   ??? Calcium 03/22/2019 9.4  8.7 - 10.3 mg/dL Final   ??? Protein, total 03/22/2019 8.1  6.0 - 8.5 g/dL Final   ??? Albumin 03/22/2019 4.0  3.7 - 4.7 g/dL Final   ??? GLOBULIN, TOTAL 03/22/2019 4.1  1.5 - 4.5 g/dL Final   ??? A-G Ratio 03/22/2019 1.0* 1.2 - 2.2 Final   ??? Bilirubin, total 03/22/2019 0.6  0.0 - 1.2 mg/dL Final   ??? Alk. phosphatase 03/22/2019 67  39 - 117 IU/L Final   ??? AST (SGOT) 03/22/2019 12  0 - 40 IU/L Final   ??? ALT (SGPT) 03/22/2019 7  0 - 32 IU/L Final         The results above were reviewed and discussed with patient.         Assessment/Plan:   Ishani Goldwasser is a 78 y.o. female who presents with:     1. PMR (polymyalgia rheumatica) ([place]): Was instructed to remain off the prednisone for now.  If she does have recurrence of temporal headaches, jaw pain and double vision she was instructed to call me ASAP or go to  the nearest ER. If there is any noted abnormality I will keep the patient informed but if not I will review her labs with her on follow-up.  -     C REACTIVE PROTEIN, QT  -     SED RATE (ESR)    Disease activity plan:  As stated above.    Steroid management plan:  As stated above, if applicable.    Pain management plan:  As stated above, if applicable.    Weight management plan:  Weight loss through diet and exercise is always encouraged    Disease prognosis: Good        I appreciate the opportunity to continue to participate in the care of this patient.     Follow-up and Dispositions    ?? Return in about 3 months (around 07/26/2019).       Electronically signed by:  Jeffie Pollock, MD      This note was dictated using dragon voice recognition software.  It has been proofread, but there may still exist voice recognition errors that the author did not detect.                --------------------------------------------------------------------------------------------------------------------------------------------------------------------------------------------------------------------------------

## 2019-04-26 LAB — SED RATE (ESR): Sed rate (ESR): 86 mm/hr — ABNORMAL HIGH (ref 0–40)

## 2019-04-26 LAB — C REACTIVE PROTEIN, QT: C-Reactive Protein, Qt: 81 mg/L — ABNORMAL HIGH (ref 0–10)

## 2019-04-26 LAB — SEDIMENTATION RATE: Sed Rate: 86 mm/hr — ABNORMAL HIGH (ref 0–40)

## 2019-04-26 LAB — C-REACTIVE PROTEIN: CRP: 81 mg/L — ABNORMAL HIGH (ref 0–10)

## 2019-05-04 ENCOUNTER — Encounter

## 2019-05-04 ENCOUNTER — Inpatient Hospital Stay: Admit: 2019-05-04 | Payer: MEDICARE | Attending: Adult Health | Primary: Family Medicine

## 2019-05-04 DIAGNOSIS — E079 Disorder of thyroid, unspecified: Secondary | ICD-10-CM

## 2019-05-04 MED ORDER — BARIUM SULFATE 60 % (W/V) ORAL SUSP
60 % (w/v) | Freq: Once | ORAL | Status: AC
Start: 2019-05-04 — End: 2019-05-04
  Administered 2019-05-04: 15:00:00 via ORAL

## 2019-05-04 MED ORDER — SOD BICARB-CITRIC AC-SIMETH 2.21 GRAM-1.53 GRAM/4 GRAM ORAL GRAN IN PK
Freq: Once | ORAL | Status: AC
Start: 2019-05-04 — End: 2019-05-04
  Administered 2019-05-04: 15:00:00 via ORAL

## 2019-05-04 MED ORDER — BARIUM SULFATE 98 % ORAL SUSP, RECON
98 % | Freq: Once | ORAL | Status: AC
Start: 2019-05-04 — End: 2019-05-04
  Administered 2019-05-04: 15:00:00 via ORAL

## 2019-05-04 MED ORDER — BARIUM SULFATE 700 MG TAB
700 mg | Freq: Once | ORAL | Status: AC
Start: 2019-05-04 — End: 2019-05-04
  Administered 2019-05-04: 15:00:00 via ORAL

## 2019-05-16 ENCOUNTER — Inpatient Hospital Stay: Payer: MEDICARE | Attending: Adult Health | Primary: Family Medicine

## 2019-05-16 DIAGNOSIS — K5901 Slow transit constipation: Secondary | ICD-10-CM

## 2019-05-16 LAB — CREATININE, POC
Creatinine (POC): 1.3 mg/dL (ref 0.8–1.5)
GFRAA, POC: 51 mL/min/{1.73_m2} — ABNORMAL LOW (ref >60)
GFRNA, POC: 42 mL/min/{1.73_m2} — ABNORMAL LOW (ref >60)

## 2019-05-16 LAB — AMB POC CREATININE
GFR African American: 51 mL/min/{1.73_m2} — ABNORMAL LOW (ref >60)
GFR Non-African American: 42 mL/min/{1.73_m2} — ABNORMAL LOW (ref >60)
POC Creatinine: 1.3 mg/dL (ref 0.8–1.5)

## 2019-05-16 MED ORDER — IOPAMIDOL 76 % IV SOLN
76 % | Freq: Once | INTRAVENOUS | Status: AC
Start: 2019-05-16 — End: 2019-05-16
  Administered 2019-05-16: 16:00:00 via INTRAVENOUS

## 2019-05-16 MED ORDER — SALINE PERIPHERAL FLUSH PRN
Freq: Once | INTRAMUSCULAR | Status: AC
Start: 2019-05-16 — End: 2019-05-16
  Administered 2019-05-16: 16:00:00

## 2019-05-16 MED ORDER — SODIUM CHLORIDE 0.9% BOLUS IV
0.9 % | Freq: Once | INTRAVENOUS | Status: AC
Start: 2019-05-16 — End: 2019-05-16
  Administered 2019-05-16: 16:00:00 via INTRAVENOUS

## 2019-05-16 MED ORDER — DIATRIZOATE MEGLUMINE & SODIUM 66 %-10 % ORAL SOLN
66-10 % | Freq: Once | ORAL | Status: AC
Start: 2019-05-16 — End: 2019-05-16
  Administered 2019-05-16: 16:00:00 via ORAL

## 2019-06-05 ENCOUNTER — Encounter: Attending: Rheumatology | Primary: Family Medicine

## 2019-06-07 NOTE — Telephone Encounter (Signed)
Patient called requesting results from swollow study she says that her gi doctor has not called her. Notified her per Dr. Ezzard Standing it was normal, but she is to call GI to see if they are in agreement, and what her next step is since she is still having problems swollowing.

## 2019-06-13 ENCOUNTER — Emergency Department: Admit: 2019-06-13 | Payer: MEDICARE | Primary: Family Medicine

## 2019-06-13 ENCOUNTER — Inpatient Hospital Stay
Admit: 2019-06-13 | Discharge: 2019-06-13 | Disposition: A | Discharge status: Home / Self Care | Payer: MEDICARE | Attending: Student in an Organized Health Care Education/Training Program

## 2019-06-13 DIAGNOSIS — M353 Polymyalgia rheumatica: Secondary | ICD-10-CM

## 2019-06-13 LAB — EKG, 12 LEAD, INITIAL
Atrial Rate: 76 {beats}/min
Calculated P Axis: 26 degrees
Calculated R Axis: 32 degrees
Calculated T Axis: 86 degrees
P-R Interval: 212 ms
Q-T Interval: 396 ms
QRS Duration: 86 ms
QTC Calculation (Bezet): 445 ms
Ventricular Rate: 76 {beats}/min

## 2019-06-13 LAB — CBC WITH AUTOMATED DIFF
ABS. BASOPHILS: 0 10*3/uL (ref 0.0–0.2)
ABS. EOSINOPHILS: 0.2 10*3/uL (ref 0.0–0.8)
ABS. IMM. GRANS.: 0 10*3/uL (ref 0.0–0.5)
ABS. LYMPHOCYTES: 1.7 10*3/uL (ref 0.5–4.6)
ABS. MONOCYTES: 1 10*3/uL (ref 0.1–1.3)
ABS. NEUTROPHILS: 6.4 10*3/uL (ref 1.7–8.2)
ABSOLUTE NRBC: 0 10*3/uL (ref 0.0–0.2)
BASOPHILS: 0 % (ref 0.0–2.0)
EOSINOPHILS: 2 % (ref 0.5–7.8)
HCT: 28.4 % — ABNORMAL LOW (ref 35.8–46.3)
HGB: 8.9 g/dL — ABNORMAL LOW (ref 11.7–15.4)
IMMATURE GRANULOCYTES: 0 % (ref 0.0–5.0)
LYMPHOCYTES: 18 % (ref 13–44)
MCH: 25.9 PG — ABNORMAL LOW (ref 26.1–32.9)
MCHC: 31.3 g/dL — ABNORMAL LOW (ref 31.4–35.0)
MCV: 82.8 FL (ref 79.6–97.8)
MONOCYTES: 10 % (ref 4.0–12.0)
MPV: 10.2 FL (ref 9.4–12.3)
NEUTROPHILS: 69 % (ref 43–78)
PLATELET: 249 10*3/uL (ref 150–450)
RBC: 3.43 M/uL — ABNORMAL LOW (ref 4.05–5.2)
RDW: 16 % — ABNORMAL HIGH (ref 11.9–14.6)
WBC: 9.2 10*3/uL (ref 4.3–11.1)

## 2019-06-13 LAB — URINALYSIS W/ RFLX MICROSCOPIC
Bilirubin, Urine: NEGATIVE
Bilirubin: NEGATIVE
Blood, Urine: NEGATIVE
Blood: NEGATIVE
Glucose, Ur: NEGATIVE mg/dL
Glucose: NEGATIVE mg/dL
Ketone: NEGATIVE mg/dL
Ketones, Urine: NEGATIVE mg/dL
Leukocyte Esterase, Urine: NEGATIVE
Leukocyte Esterase: NEGATIVE
Nitrite, Urine: NEGATIVE
Nitrites: NEGATIVE
Protein, UA: NEGATIVE mg/dL
Protein: NEGATIVE mg/dL
Specific Gravity, UA: 1.008 (ref 1.001–1.023)
Specific gravity: 1.008 (ref 1.001–1.023)
Urobilinogen, UA, POCT: 0.2 EU/dL (ref 0.2–1.0)
Urobilinogen: 0.2 EU/dL (ref 0.2–1.0)
pH (UA): 6 (ref 5.0–9.0)
pH, UA: 6 (ref 5.0–9.0)

## 2019-06-13 LAB — METABOLIC PANEL, COMPREHENSIVE
A-G Ratio: 0.5 — ABNORMAL LOW (ref 1.2–3.5)
ALT (SGPT): 14 U/L (ref 12–65)
AST (SGOT): 21 U/L (ref 15–37)
Albumin: 2.9 g/dL — ABNORMAL LOW (ref 3.2–4.6)
Alk. phosphatase: 62 U/L (ref 50–130)
Anion gap: 7 mmol/L (ref 7–16)
BUN: 14 MG/DL (ref 8–23)
Bilirubin, total: 0.5 MG/DL (ref 0.2–1.1)
CO2: 26 mmol/L (ref 21–32)
Calcium: 9.6 MG/DL (ref 8.3–10.4)
Chloride: 105 mmol/L (ref 98–107)
Creatinine: 1.12 MG/DL — ABNORMAL HIGH (ref 0.6–1.0)
GFR est AA: >60 mL/min/{1.73_m2} (ref >60)
GFR est non-AA: 50 mL/min/{1.73_m2} — ABNORMAL LOW (ref >60)
Globulin: 5.7 g/dL — ABNORMAL HIGH (ref 2.3–3.5)
Glucose: 107 mg/dL — ABNORMAL HIGH (ref 65–100)
Potassium: 4.4 mmol/L (ref 3.5–5.1)
Protein, total: 8.6 g/dL — ABNORMAL HIGH (ref 6.3–8.2)
Sodium: 138 mmol/L (ref 136–145)

## 2019-06-13 LAB — CBC WITH AUTO DIFFERENTIAL
Basophils %: 0 % (ref 0.0–2.0)
Basophils Absolute: 0 10*3/uL (ref 0.0–0.2)
Eosinophils %: 2 % (ref 0.5–7.8)
Eosinophils Absolute: 0.2 10*3/uL (ref 0.0–0.8)
Granulocyte Absolute Count: 0 10*3/uL (ref 0.0–0.5)
Hematocrit: 28.4 % — ABNORMAL LOW (ref 35.8–46.3)
Hemoglobin: 8.9 g/dL — ABNORMAL LOW (ref 11.7–15.4)
Immature Granulocytes: 0 % (ref 0.0–5.0)
Lymphocytes %: 18 % (ref 13–44)
Lymphocytes Absolute: 1.7 10*3/uL (ref 0.5–4.6)
MCH: 25.9 PG — ABNORMAL LOW (ref 26.1–32.9)
MCHC: 31.3 g/dL — ABNORMAL LOW (ref 31.4–35.0)
MCV: 82.8 FL (ref 79.6–97.8)
MPV: 10.2 FL (ref 9.4–12.3)
Monocytes %: 10 % (ref 4.0–12.0)
Monocytes Absolute: 1 10*3/uL (ref 0.1–1.3)
NRBC Absolute: 0 10*3/uL (ref 0.0–0.2)
Neutrophils %: 69 % (ref 43–78)
Neutrophils Absolute: 6.4 10*3/uL (ref 1.7–8.2)
Platelets: 249 10*3/uL (ref 150–450)
RBC: 3.43 M/uL — ABNORMAL LOW (ref 4.05–5.2)
RDW: 16 % — ABNORMAL HIGH (ref 11.9–14.6)
WBC: 9.2 10*3/uL (ref 4.3–11.1)

## 2019-06-13 LAB — COMPREHENSIVE METABOLIC PANEL
ALT: 14 U/L (ref 12–65)
AST: 21 U/L (ref 15–37)
Albumin/Globulin Ratio: 0.5 — ABNORMAL LOW (ref 1.2–3.5)
Albumin: 2.9 g/dL — ABNORMAL LOW (ref 3.2–4.6)
Alkaline Phosphatase: 62 U/L (ref 50–130)
Anion Gap: 7 mmol/L (ref 7–16)
BUN: 14 MG/DL (ref 8–23)
CO2: 26 mmol/L (ref 21–32)
Calcium: 9.6 MG/DL (ref 8.3–10.4)
Chloride: 105 mmol/L (ref 98–107)
Creatinine: 1.12 MG/DL — ABNORMAL HIGH (ref 0.6–1.0)
EGFR IF NonAfrican American: 50 mL/min/{1.73_m2} — ABNORMAL LOW (ref >60)
GFR African American: >60 mL/min/{1.73_m2} (ref >60)
Globulin: 5.7 g/dL — ABNORMAL HIGH (ref 2.3–3.5)
Glucose: 107 mg/dL — ABNORMAL HIGH (ref 65–100)
Potassium: 4.4 mmol/L (ref 3.5–5.1)
Sodium: 138 mmol/L (ref 136–145)
Total Bilirubin: 0.5 MG/DL (ref 0.2–1.1)
Total Protein: 8.6 g/dL — ABNORMAL HIGH (ref 6.3–8.2)

## 2019-06-13 LAB — EKG 12-LEAD
Atrial Rate: 76 {beats}/min
P Axis: 26 degrees
P-R Interval: 212 ms
Q-T Interval: 396 ms
QRS Duration: 86 ms
QTc Calculation (Bazett): 445 ms
R Axis: 32 degrees
T Axis: 86 degrees
Ventricular Rate: 76 {beats}/min

## 2019-06-13 MED ORDER — NAPROXEN 500 MG TAB
500 mg | ORAL_TABLET | Freq: Two times a day (BID) | ORAL | 0 refills | Status: AC
Start: 2019-06-13 — End: 2019-06-23

## 2019-06-13 MED ORDER — KETOROLAC TROMETHAMINE 30 MG/ML INJECTION
30 mg/mL (1 mL) | INTRAMUSCULAR | Status: AC
Start: 2019-06-13 — End: 2019-06-13
  Administered 2019-06-13: 15:00:00 via INTRAMUSCULAR

## 2019-06-13 MED ORDER — DEXAMETHASONE SODIUM PHOSPHATE 10 MG/ML IJ SOLN
10 mg/mL | INTRAMUSCULAR | Status: AC
Start: 2019-06-13 — End: 2019-06-13
  Administered 2019-06-13: 15:00:00 via ORAL

## 2019-06-13 MED ORDER — PREDNISONE 5 MG TABLETS IN A DOSE PACK
5 mg | ORAL_TABLET | ORAL | 0 refills | Status: DC
Start: 2019-06-13 — End: 2019-07-21

## 2019-06-13 MED FILL — KETOROLAC TROMETHAMINE 30 MG/ML INJECTION: 30 mg/mL (1 mL) | INTRAMUSCULAR | Qty: 1

## 2019-06-13 MED FILL — DEXAMETHASONE SODIUM PHOSPHATE 10 MG/ML IJ SOLN: 10 mg/mL | INTRAMUSCULAR | Qty: 1

## 2019-06-13 NOTE — ED Provider Notes (Signed)
78 year old female patient presents to the emergency department with reports of bilateral shoulder pain worse on the right, Neri frequency.  Symptoms have been present for several weeks.  Patient states her symptoms worsened over the past week.  She denies falls or trauma, changes in activity.  There is no associated fever or chills.  Denies chest pain pressure or tightness.  No associated shortness of breath, nausea vomiting or diaphoresis.  Patient states she is kept up throughout the night by her discomfort and frequency of urination.  She describes dull throbbing pain over the right shoulder that radiates into the lateral aspect the proximal arm.  This is worsened with movement and palpation.  She experiences very similar symptoms though much less pronounced over the left side.  She denies any changes throughout the day in her pain.  There is no significant joint stiffness reported.           Past Medical History:   Diagnosis Date   ??? Abdominal pain 07/22/2015   ??? Anemia    ??? Back pain    ??? Bursitis of shoulder    ??? CAD (coronary artery disease)    ??? Cerumen impaction    ??? Chest pain, atypical 07/22/2015   ??? Diabetes ([facility])     no meds, A1c 09/28/17 6.6; avg bs 105; denies ss of hypo   ??? Diabetes mellitus out of control ([facility])    ??? Endocrine disease     thyroid   ??? Fatigue    ??? GERD (gastroesophageal reflux disease)     controlled with medicaitons   ??? Hip pain, right    ??? Hyperlipidemia, mild    ??? Hypertension     managed with medication   ??? Hypertension associated with diabetes ([facility]) 07/22/2015   ??? 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 Left carotid bruit    ??? Lumbago 07/22/2015   ??? Morbid obesity ([facility]) 07/22/2015    BMI 35.3   ??? OSA on CPAP 07/22/2015    uses CPAP   ??? Other ill-defined conditions(799.89)     cholesterol   ??? Other ill-defined conditions(799.89)     heart cath 96   ??? Shoulder pain, acute    ??? Shoulder pain, bilateral    ??? Sinusitis, acute maxillary     ??? Unstable angina ([facility]) 07/06/2015   ??? Vitamin D deficiency    ??? Vitamin D deficiency 04/12/2019   ??? Weakness        Past Surgical History:   Procedure Laterality Date   ??? COLONOSCOPY N/A 03/01/2018    COLONOSCOPY/BMI 39 [facility]anec[facility] Worthy RancherPaul A[facility]   ??? HX COLONOSCOPY     ??? HX HEART CATHETERIZATION  2016    without intervention   ??? HX HEENT      goiter/thyroid surgery   ??? HX THYROIDECTOMY           Family History:   Problem Relation Age of Onset   ??? Stroke Sister    ??? Cancer Sister         cervical   ??? Stroke Brother    ??? Diabetes Other    ??? Hypertension Other        Social History     Socioeconomic History   ??? Marital status: SINGLE     Spouse name: Not on file   ??? Number of children: Not on file   ??? Years of education: Not on file   ??? Highest  education level: Not on file   Occupational History   ??? Not on file   Social Needs   ??? Financial resource strain: Not on file   ??? Food insecurity     Worry: Not on file     Inability: Not on file   ??? Transportation needs     Medical: Not on file     Non-medical: Not on file   Tobacco Use   ??? Smoking status: Former Smoker     Packs/day: 0.50     Years: 10.00     Pack years: 5.00     Last attempt to quit: 1971     Years since quitting: 49.7   ??? Smokeless tobacco: Never Used   ??? Tobacco comment: quit in 1971   Substance and Sexual Activity   ??? Alcohol use: No   ??? Drug use: No   ??? Sexual activity: Not on file   Lifestyle   ??? Physical activity     Days per week: Not on file     Minutes per session: Not on file   ??? Stress: Not on file   Relationships   ??? Social Product manager on phone: Not on file     Gets together: Not on file     Attends religious service: Not on file     Active member of club or organization: Not on file     Attends meetings of clubs or organizations: Not on file     Relationship status: Not on file   ??? Intimate partner violence     Fear of current or ex partner: Not on file     Emotionally abused: Not on file      Physically abused: Not on file     Forced sexual activity: Not on file   Other Topics Concern   ??? Not on file   Social History Narrative    ** Merged History Encounter **              ALLERGIES: Ativan [lorazepam]; Ativan [lorazepam]; Other medication; and Other medication    Review of Systems   Constitutional: Negative for chills, diaphoresis and fever.   HENT: Negative for congestion, sneezing and sore throat.    Eyes: Negative for visual disturbance.   Respiratory: Negative for cough, chest tightness, shortness of breath and wheezing.    Cardiovascular: Negative for chest pain and leg swelling.   Gastrointestinal: Negative for abdominal pain, blood in stool, diarrhea, nausea and vomiting.   Endocrine: Negative for polyuria.   Genitourinary: Positive for frequency. Negative for difficulty urinating, dysuria, flank pain, hematuria and urgency.   Musculoskeletal: Positive for arthralgias and back pain. Negative for myalgias, neck pain and neck stiffness.   Skin: Negative for color change and rash.   Neurological: Negative for dizziness, syncope, speech difficulty, weakness, light-headedness, numbness and headaches.   Psychiatric/Behavioral: Negative for behavioral problems.   All other systems reviewed and are negative.      Vitals:    06/13/19 1013   BP: (!) 208/77   Pulse: 94   Resp: 20   Temp: 98.3 ??F (36.8 ??C)   SpO2: 95%   Weight: 85.3 kg (188 lb)   Height: 5\' 2"  (1.575 m)            Physical Exam  Vitals signs and nursing note reviewed.   Constitutional:       General: She is not in acute distress.     Appearance: She  is well-developed. She is not diaphoretic.      Comments: Alert and oriented to person place and time.  No acute distress, speaks in clear, fluid sentences.   HENT:      Head: Normocephalic and atraumatic.      Right Ear: External ear normal.      Left Ear: External ear normal.      Nose: Nose normal.   Eyes:      Pupils: Pupils are equal, round, and reactive to light.   Neck:       Musculoskeletal: Normal range of motion.   Cardiovascular:      Rate and Rhythm: Normal rate and regular rhythm.      Heart sounds: Normal heart sounds. No murmur. No friction rub. No gallop.    Pulmonary:      Effort: Pulmonary effort is normal. No respiratory distress.      Breath sounds: Normal breath sounds. No stridor. No decreased breath sounds, wheezing, rhonchi or rales.   Chest:      Chest wall: No tenderness.   Abdominal:      General: There is no distension.      Palpations: Abdomen is soft. There is no mass.      Tenderness: There is no abdominal tenderness. There is no guarding or rebound.      Hernia: No hernia is present.   Musculoskeletal: Normal range of motion.         General: No tenderness or deformity.      Comments: Patient's active range of motion is limited by pain in all directions.  Passively, I am able to range patient's shoulder easily though she reports pain with forward flexion and abduction.  There is no palpable crepitus or visible deformity on exam.  Distal pulses are intact.   Skin:     General: Skin is warm and dry.   Neurological:      Mental Status: She is alert and oriented to person, place, and time.      Cranial Nerves: No cranial nerve deficit.          MDM  Number of Diagnoses or Management Options  Diagnosis management comments: Patient has a history of rheumatoid arthritis for which she is followed by a rheumatologist.  Suspect exacerbation of this process as her symptoms involve both shoulders and upper back.    We will check basic labs, x-ray of the right shoulder and chest.       Amount and/or Complexity of Data Reviewed  Clinical lab tests: ordered and reviewed  Tests in the radiology section of CPT??: ordered and reviewed  Tests in the medicine section of CPT??: ordered and reviewed  Independent visualization of images, tracings, or specimens: yes    Risk of Complications, Morbidity, and/or Mortality  Presenting problems: moderate  Diagnostic procedures: low   Management options: moderate    Patient Progress  Patient progress: stable         Procedures

## 2019-06-13 NOTE — ED Triage Notes (Signed)
Pt presents to ED with c/o bilateral shoulder pain but the right is the worst.  Pt did not fall and states she woke up like this. Family says this pain has been going on for a few months.  Pt and family masked prior to triage.

## 2019-06-13 NOTE — ED Notes (Signed)
I have reviewed discharge instructions with the patient.  The patient verbalized understanding.    Patient left ED via Discharge Method: wheelchair to Home with daughter      Opportunity for questions and clarification provided.       Patient given 2 scripts.         To continue your aftercare when you leave the hospital, you may receive an automated call from our care team to check in on how you are doing.  This is a free service and part of our promise to provide the best care and service to meet your aftercare needs.??? If you have questions, or wish to unsubscribe from this service please call [phone].  Thank you for Choosing our [facility].

## 2019-06-13 NOTE — ED Notes (Signed)
I have reviewed discharge instructions with the patient.  The patient verbalized understanding.    Patient left ED via Discharge Method: wheelchair to Home with daughter.    Opportunity for questions and clarification provided.       Patient given 2 scripts.         To continue your aftercare when you leave the hospital, you may receive an automated call from our care team to check in on how you are doing.  This is a free service and part of our promise to provide the best care and service to meet your aftercare needs." If you have questions, or wish to unsubscribe from this service please call [phone].  Thank you for Choosing our [facility].

## 2019-06-13 NOTE — ED Provider Notes (Signed)
78 year old female patient presents to the emergency department with reports of bilateral shoulder pain worse on the right, Neri frequency.  Symptoms have been present for several weeks.  Patient states her symptoms worsened over the past week.  She denies falls or trauma, changes in activity.  There is no associated fever or chills.  Denies chest pain pressure or tightness.  No associated shortness of breath, nausea vomiting or diaphoresis.  Patient states she is kept up throughout the night by her discomfort and frequency of urination.  She describes dull throbbing pain over the right shoulder that radiates into the lateral aspect the proximal arm.  This is worsened with movement and palpation.  She experiences very similar symptoms though much less pronounced over the left side.  She denies any changes throughout the day in her pain.  There is no significant joint stiffness reported.           Past Medical History:   Diagnosis Date   ??? Abdominal pain 07/22/2015   ??? Anemia    ??? Back pain    ??? Bursitis of shoulder    ??? CAD (coronary artery disease)    ??? Cerumen impaction    ??? Chest pain, atypical 07/22/2015   ??? Diabetes (HCC)     no meds, A1c 09/28/17 6.6; avg bs 105; denies ss of hypo   ??? Diabetes mellitus out of control (HCC)    ??? Endocrine disease     thyroid   ??? Fatigue    ??? GERD (gastroesophageal reflux disease)     controlled with medicaitons   ??? Hip pain, right    ??? Hyperlipidemia, mild    ??? Hypertension     managed with medication   ??? Hypertension associated with diabetes (HCC) 07/22/2015   ??? 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 Left carotid bruit    ??? Lumbago 07/22/2015   ??? Morbid obesity (HCC) 07/22/2015    BMI 35.3   ??? OSA on CPAP 07/22/2015    uses CPAP   ??? Other ill-defined conditions(799.89)     cholesterol   ??? Other ill-defined conditions(799.89)     heart cath 96   ??? Shoulder pain, acute    ??? Shoulder pain, bilateral    ??? Sinusitis, acute maxillary    ??? Unstable angina (HCC)  07/06/2015   ??? Vitamin D deficiency    ??? Vitamin D deficiency 04/12/2019   ??? Weakness        Past Surgical History:   Procedure Laterality Date   ??? COLONOSCOPY N/A 03/01/2018    COLONOSCOPY/BMI 39 performed by Mazanec, Worthy Rancher, MD at [facility] END[facility]COPY   ??? HX COLONOSCOPY     ??? HX HEART CATHETERIZATION  2016    without intervention   ??? HX HEENT      goiter/thyroid surgery   ??? HX THYROIDECTOMY           Family History:   Problem Relation Age of Onset   ??? Stroke Sister    ??? Cancer Sister         cervical   ??? Stroke Brother    ??? Diabetes Other    ??? Hypertension Other        Social History     Socioeconomic History   ??? Marital status: SINGLE     Spouse name: Not on file   ??? Number of children: Not on file   ??? Years of education: Not on file   ??? Highest  education level: Not on file   Occupational History   ??? Not on file   Social Needs   ??? Financial resource strain: Not on file   ??? Food insecurity     Worry: Not on file     Inability: Not on file   ??? Transportation needs     Medical: Not on file     Non-medical: Not on file   Tobacco Use   ??? Smoking status: Former Smoker     Packs/day: 0.50     Years: 10.00     Pack years: 5.00     Last attempt to quit: 1971     Years since quitting: 49.7   ??? Smokeless tobacco: Never Used   ??? Tobacco comment: quit in 1971   Substance and Sexual Activity   ??? Alcohol use: No   ??? Drug use: No   ??? Sexual activity: Not on file   Lifestyle   ??? Physical activity     Days per week: Not on file     Minutes per session: Not on file   ??? Stress: Not on file   Relationships   ??? Social Product manager on phone: Not on file     Gets together: Not on file     Attends religious service: Not on file     Active member of club or organization: Not on file     Attends meetings of clubs or organizations: Not on file     Relationship status: Not on file   ??? Intimate partner violence     Fear of current or ex partner: Not on file     Emotionally abused: Not on file     Physically abused: Not on file     Forced  sexual activity: Not on file   Other Topics Concern   ??? Not on file   Social History Narrative    ** Merged History Encounter **              ALLERGIES: Ativan [lorazepam]; Ativan [lorazepam]; Other medication; and Other medication    Review of Systems   Constitutional: Negative for chills, diaphoresis and fever.   HENT: Negative for congestion, sneezing and sore throat.    Eyes: Negative for visual disturbance.   Respiratory: Negative for cough, chest tightness, shortness of breath and wheezing.    Cardiovascular: Negative for chest pain and leg swelling.   Gastrointestinal: Negative for abdominal pain, blood in stool, diarrhea, nausea and vomiting.   Endocrine: Negative for polyuria.   Genitourinary: Positive for frequency. Negative for difficulty urinating, dysuria, flank pain, hematuria and urgency.   Musculoskeletal: Positive for arthralgias and back pain. Negative for myalgias, neck pain and neck stiffness.   Skin: Negative for color change and rash.   Neurological: Negative for dizziness, syncope, speech difficulty, weakness, light-headedness, numbness and headaches.   Psychiatric/Behavioral: Negative for behavioral problems.   All other systems reviewed and are negative.      Vitals:    06/13/19 1013   BP: (!) 208/77   Pulse: 94   Resp: 20   Temp: 98.3 ??F (36.8 ??C)   SpO2: 95%   Weight: 85.3 kg (188 lb)   Height: 5\' 2"  (1.575 m)            Physical Exam  Vitals signs and nursing note reviewed.   Constitutional:       General: She is not in acute distress.     Appearance: She  is well-developed. She is not diaphoretic.      Comments: Alert and oriented to person place and time.  No acute distress, speaks in clear, fluid sentences.   HENT:      Head: Normocephalic and atraumatic.      Right Ear: External ear normal.      Left Ear: External ear normal.      Nose: Nose normal.   Eyes:      Pupils: Pupils are equal, round, and reactive to light.   Neck:      Musculoskeletal: Normal range of motion.    Cardiovascular:      Rate and Rhythm: Normal rate and regular rhythm.      Heart sounds: Normal heart sounds. No murmur. No friction rub. No gallop.    Pulmonary:      Effort: Pulmonary effort is normal. No respiratory distress.      Breath sounds: Normal breath sounds. No stridor. No decreased breath sounds, wheezing, rhonchi or rales.   Chest:      Chest wall: No tenderness.   Abdominal:      General: There is no distension.      Palpations: Abdomen is soft. There is no mass.      Tenderness: There is no abdominal tenderness. There is no guarding or rebound.      Hernia: No hernia is present.   Musculoskeletal: Normal range of motion.         General: No tenderness or deformity.      Comments: Patient's active range of motion is limited by pain in all directions.  Passively, I am able to range patient's shoulder easily though she reports pain with forward flexion and abduction.  There is no palpable crepitus or visible deformity on exam.  Distal pulses are intact.   Skin:     General: Skin is warm and dry.   Neurological:      Mental Status: She is alert and oriented to person, place, and time.      Cranial Nerves: No cranial nerve deficit.          MDM  Number of Diagnoses or Management Options  Diagnosis management comments: Patient has a history of rheumatoid arthritis for which she is followed by a rheumatologist.  Suspect exacerbation of this process as her symptoms involve both shoulders and upper back.    We will check basic labs, x-ray of the right shoulder and chest.       Amount and/or Complexity of Data Reviewed  Clinical lab tests: ordered and reviewed  Tests in the radiology section of CPT??: ordered and reviewed  Tests in the medicine section of CPT??: ordered and reviewed  Independent visualization of images, tracings, or specimens: yes    Risk of Complications, Morbidity, and/or Mortality  Presenting problems: moderate  Diagnostic procedures: low  Management options: moderate    Patient  Progress  Patient progress: stable         Procedures

## 2019-06-13 NOTE — ED Notes (Signed)
Pt presents to ED with c/o bilateral shoulder pain but the right is the worst.  Pt did not fall and states she woke up like this. Family says this pain has been going on for a few months.  Pt and family masked prior to triage.

## 2019-06-14 NOTE — Progress Notes (Signed)
Transitions of Care outreach in follow up to ED visit 06/13/2019  Presented to ED with shoulder pain - this pain is chronic, but patient describes today that it had gotten different and worse.  Patient sees rheumatology - has follow up scheduled for 07/25/2019    Reviewed medications prescribed in the ED  (patient having trouble understanding how to take the prednisone)    Patient would like her granddaughter - Sarah Chavez to be on her chart as an emergency contact.  Called number given by patient [phone] to confirm name spelling - voicemail box full, unable to reach.  Will try again tomorrow pending a return call.

## 2019-06-14 NOTE — Progress Notes (Signed)
Unable to reach granddaughter  Mailbox full - unable to accept messages.

## 2019-06-14 NOTE — Progress Notes (Signed)
Transitions of Care outreach in follow up to ED visit 06/13/2019  Presented to ED with shoulder pain - this pain is chronic, but patient describes today that it had gotten different and worse.  Patient sees rheumatology - has follow up scheduled for 07/25/2019    Reviewed medications prescribed in the ED  (patient having trouble understanding how to take the prednisone)    Patient would like her granddaughter - Joylene Igo to be on her chart as an emergency contact.  Called number given by patient [phone] to confirm name spelling - voicemail box full, unable to reach.  Will try again tomorrow pending a return call.

## 2019-06-14 NOTE — Progress Notes (Signed)
Unable to reach granddaughter  Mailbox full - unable to accept messages.

## 2019-07-05 MED ORDER — CHOLECALCIFEROL (VITAMIN D3) 2,000 UNIT CAPSULE
ORAL_CAPSULE | ORAL | 5 refills | Status: DC
Start: 2019-07-05 — End: 2019-07-07

## 2019-07-05 MED ORDER — GLIPIZIDE SR 2.5 MG 24 HR TAB
2.5 mg | ORAL_TABLET | ORAL | 5 refills | Status: DC
Start: 2019-07-05 — End: 2019-07-07

## 2019-07-07 MED ORDER — GLIPIZIDE SR 2.5 MG 24 HR TAB
2.5 mg | ORAL_TABLET | ORAL | 5 refills | Status: DC
Start: 2019-07-07 — End: 2019-12-31

## 2019-07-07 MED ORDER — CHOLECALCIFEROL (VITAMIN D3) 2,000 UNIT CAPSULE
ORAL_CAPSULE | ORAL | 5 refills | Status: DC
Start: 2019-07-07 — End: 2019-10-18

## 2019-07-21 ENCOUNTER — Ambulatory Visit: Attending: Family Medicine | Primary: Family Medicine

## 2019-07-21 ENCOUNTER — Ambulatory Visit: Admit: 2019-07-21 | Discharge: 2019-07-21 | Payer: MEDICARE | Attending: Family Medicine | Primary: Family Medicine

## 2019-07-21 DIAGNOSIS — I10 Essential (primary) hypertension: Secondary | ICD-10-CM

## 2019-07-21 MED ORDER — DICLOFENAC 75 MG TAB, DELAYED RELEASE
75 mg | ORAL_TABLET | Freq: Two times a day (BID) | ORAL | 0 refills | Status: DC
Start: 2019-07-21 — End: 2019-07-25

## 2019-07-21 MED ORDER — AMLODIPINE-BENAZEPRIL 5 MG-20 MG CAP
5-20 mg | ORAL_CAPSULE | Freq: Every day | ORAL | 3 refills | Status: DC
Start: 2019-07-21 — End: 2019-10-18

## 2019-07-21 NOTE — Progress Notes (Signed)
SUBJECTIVE:   Sarah Chavez is a 78 y.o. female who has a past medical history significant for hypertension, anemia of chronic disease, vitamin D deficiency, high cholesterol, diabetes and obesity.  Patient presents today accompanied by her daughter.  She reports that she has been monitoring her blood pressure outside the office since her last visit is slowly been increasing.  She reports continued compliance with medication.  In addition she reports that the Vesicare I placed her on for presumed overactive bladder and urinary frequency has not really helped.  She is also been evaluated by GI since I last seen her regarding her dysphagia.  Work-up is ongoing.  Patient reports also some left lateral chest wall discomfort.  This pain is positional and worse with palpation.  She reports that she "slipped out of the bed" several weeks ago and her discomfort began after that.  She reports however that she did not hit her left side and does not think it is connected.  She reports no exertional chest pain or shortness of breath.    HPI  See above    Past Medical History, Past Surgical History, Family history, Social History, and Medications were all reviewed with the patient today and updated as necessary.       Current Outpatient Medications   Medication Sig Dispense Refill   ??? amLODIPine-benazepril (LotreL) 5-20 mg per capsule Take 1 Cap by mouth daily. 90 Cap 3   ??? diclofenac EC (VOLTAREN) 75 mg EC tablet Take 1 Tab by mouth two (2) times a day for 7 days. 14 Tab 0   ??? glipiZIDE SR (GLUCOTROL XL) 2.5 mg CR tablet TAKE 1 TABLET BY MOUTH DAILY 30 Tab 5   ??? cholecalciferol (Vitamin D3) (2,000 UNITS /50 MCG) cap capsule TAKE 1 CAPSULE BY MOUTH EVERY DAY 30 Cap 5   ??? pantoprazole (PROTONIX) 40 mg tablet Take 1 Tab by mouth daily. 30 Tab 5   ??? pravastatin (PRAVACHOL) 80 mg tablet Take 1 Tab by mouth daily. 90 Tab 3   ??? famotidine (PEPCID) 20 mg tablet take 1 tablet by mouth once daily 90 Tab 3    ??? folic acid 400 mcg tablet Take 400 mcg by mouth daily.     ??? nitroglycerin (NITROSTAT) 0.4 mg SL tablet Place 1 sl under the tongue q 5 min prn cp, max 3 sl in a 15-min time period. Call 911 if no relief after the 3rd sl. 1 Bottle prn   ??? aspirin 81 mg chewable tablet Take 1 Tab by mouth daily. 30 Tab 11     Allergies   Allergen Reactions   ??? Ativan [Lorazepam] Other (comments)     Made her feel crazy and chest pressure   ??? Ativan [Lorazepam] Other (comments)     Chest fullness   ??? Other Medication Palpitations     Some type of decongestant   ??? Other Medication Other (comments)     Doesn't take decongestants, but can't recall why     Patient Active Problem List   Diagnosis Code   ??? HTN (hypertension) I10   ??? Diabetes mellitus type 2, controlled (HCC) E11.9   ??? Coronary artery disease involving native coronary artery of native heart without angina pectoris I25.10   ??? Dyslipidemia E78.5   ??? OSA on CPAP G47.33, Z99.89   ??? Severe obesity (HCC) E66.01   ??? Elevated sed rate R70.0   ??? Normocytic anemia D64.9   ??? Vitamin D deficiency E55.9  Past Medical History:   Diagnosis Date   ??? Abdominal pain 07/22/2015   ??? Anemia    ??? Back pain    ??? Bursitis of shoulder    ??? CAD (coronary artery disease)    ??? Cerumen impaction    ??? Chest pain, atypical 07/22/2015   ??? Diabetes ([place])     no meds, A1c 09/28/17 6.6; avg bs 105; denies ss of hypo   ??? Diabetes mellitus out of control ([place])    ??? Endocrine disease     thyroid   ??? Fatigue    ??? GERD (gastroesophageal reflux disease)     controlled with medicaitons   ??? Hip pain, right    ??? Hyperlipidemia, mild    ??? Hypertension     managed with medication   ??? Hypertension associated with diabetes ([place]) 07/22/2015   ??? Hypertensive pulmonary vascular disease ([place]) 07/22/2015   ??? Left carotid bruit    ??? Lumbago 07/22/2015   ??? Morbid obesity ([place]) 07/22/2015    BMI 35.3   ??? OSA on CPAP 07/22/2015    uses CPAP   ??? Other ill-defined conditions(799.89)     cholesterol    ??? Other ill-defined conditions(799.89)     heart cath 96   ??? Shoulder pain, acute    ??? Shoulder pain, bilateral    ??? Sinusitis, acute maxillary    ??? Unstable angina ([place]) 07/06/2015   ??? Vitamin D deficiency    ??? Vitamin D deficiency 04/12/2019   ??? Weakness      Past Surgical History:   Procedure Laterality Date   ??? COLONOSCOPY N/A 03/01/2018    COLONOSCOPY/BMI 39 performed by Mazanec, Dorene Ar, MD at [facility] ENDOSCOPY   ??? HX COLONOSCOPY     ??? HX HEART CATHETERIZATION  2016    without intervention   ??? HX HEENT      goiter/thyroid surgery   ??? HX THYROIDECTOMY       Family History   Problem Relation Age of Onset   ??? Stroke Sister    ??? Cancer Sister         cervical   ??? Stroke Brother    ??? Diabetes Other    ??? Hypertension Other      Social History     Tobacco Use   ??? Smoking status: Former Smoker     Packs/day: 0.50     Years: 10.00     Pack years: 5.00     Last attempt to quit: [place]     Years since quitting: 49.9   ??? Smokeless tobacco: Never Used   ??? Tobacco comment: quit in 1971   Substance Use Topics   ??? Alcohol use: No         Review of Systems  See above    OBJECTIVE:  Visit Vitals  BP (!) 176/80   Ht 4\' 11"  (1.499 m)   Wt 180 lb (81.6 kg)   BMI 36.36 kg/m??        Physical Exam  Constitutional:       Appearance: She is well-developed. She is obese.   HENT:      Head: Normocephalic and atraumatic.   Eyes:      Pupils: Pupils are equal, round, and reactive to light.   Neck:      Musculoskeletal: Normal range of motion and neck supple.      Thyroid: No thyromegaly.      Vascular: No JVD.   Cardiovascular:  Rate and Rhythm: Normal rate and regular rhythm.      Heart sounds: Normal heart sounds. No murmur. No friction rub. No gallop.    Pulmonary:      Effort: Pulmonary effort is normal. No respiratory distress.      Breath sounds: No wheezing or rales.   Abdominal:      Palpations: Abdomen is soft.      Tenderness: There is no abdominal tenderness. There is no guarding or rebound.    Musculoskeletal: Normal range of motion.      Comments: Patient has some palpable discomfort along the left mid lateral chest wall.  No palpable abnormalities of her rib cage are noted.   Skin:     Findings: No rash.   Neurological:      Mental Status: She is alert and oriented to person, place, and time.         Medical problems and test results were reviewed with the patient today.         ASSESSMENT and PLAN    1.  Hypertension.  Blood pressure is 176/80.  I believe that the Zestoretic is most likely contributing to her urinary frequency.  Therefore I will use this opportunity to discontinue it to see if this will help her overactive bladder symptoms as well as to change her to something different for her blood pressure.  We will place her on Lotrel 5/20 and reevaluate blood pressure in 3 to 4 weeks.  She will continue to monitor outside the office.    2.  Dysphagia.  Continue follow-up with GI.    3.  Overactive bladder/urinary frequency.  Discontinue Vesicare.  Previous work-up including UA was clear.  Discontinue Zestoretic.    4.  Diabetes.  Most recent A1c was 6.5.  Continue current therapy.    5.  High cholesterol.  Most recent LDL was 80.    6.  Anemia of chronic disease.  Has been referred to hematology.    7.  Left lateral chest wall pain.  Treat with moist heat and Voltaren 75 mg twice a day for week.  If symptoms persist consider imaging.  May represent costochondritis.    Elements of this note have been dictated using speech recognition software. As a result, errors of speech recognition may have occurred.

## 2019-07-21 NOTE — Progress Notes (Signed)
SUBJECTIVE:   Sarah Chavez is a 78 y.o. female who has a past medical history significant for hypertension, anemia of chronic disease, vitamin D deficiency, high cholesterol, diabetes and obesity.  Patient presents today accompanied by her daughter.  She reports that she has been monitoring her blood pressure outside the office since her last visit is slowly been increasing.  She reports continued compliance with medication.  In addition she reports that the Vesicare I placed her on for presumed overactive bladder and urinary frequency has not really helped.  She is also been evaluated by GI since I last seen her regarding her dysphagia.  Work-up is ongoing.  Patient reports also some left lateral chest wall discomfort.  This pain is positional and worse with palpation.  She reports that she "slipped out of the bed" several weeks ago and her discomfort began after that.  She reports however that she did not hit her left side and does not think it is connected.  She reports no exertional chest pain or shortness of breath.    HPI  See above    Past Medical History, Past Surgical History, Family history, Social History, and Medications were all reviewed with the patient today and updated as necessary.       Current Outpatient Medications   Medication Sig Dispense Refill   ??? amLODIPine-benazepril (LotreL) 5-20 mg per capsule Take 1 Cap by mouth daily. 90 Cap 3   ??? diclofenac EC (VOLTAREN) 75 mg EC tablet Take 1 Tab by mouth two (2) times a day for 7 days. 14 Tab 0   ??? glipiZIDE SR (GLUCOTROL XL) 2.5 mg CR tablet TAKE 1 TABLET BY MOUTH DAILY 30 Tab 5   ??? cholecalciferol (Vitamin D3) (2,000 UNITS /50 MCG) cap capsule TAKE 1 CAPSULE BY MOUTH EVERY DAY 30 Cap 5   ??? pantoprazole (PROTONIX) 40 mg tablet Take 1 Tab by mouth daily. 30 Tab 5   ??? pravastatin (PRAVACHOL) 80 mg tablet Take 1 Tab by mouth daily. 90 Tab 3   ??? famotidine (PEPCID) 20 mg tablet take 1 tablet by mouth once daily 90 Tab 3   ??? folic acid 400 mcg  tablet Take 400 mcg by mouth daily.     ??? nitroglycerin (NITROSTAT) 0.4 mg SL tablet Place 1 sl under the tongue q 5 min prn cp, max 3 sl in a 15-min time period. Call 911 if no relief after the 3rd sl. 1 Bottle prn   ??? aspirin 81 mg chewable tablet Take 1 Tab by mouth daily. 30 Tab 11     Allergies   Allergen Reactions   ??? Ativan [Lorazepam] Other (comments)     Made her feel crazy and chest pressure   ??? Ativan [Lorazepam] Other (comments)     Chest fullness   ??? Other Medication Palpitations     Some type of decongestant   ??? Other Medication Other (comments)     Doesn't take decongestants, but can't recall why     Patient Active Problem List   Diagnosis Code   ??? HTN (hypertension) I10   ??? Diabetes mellitus type 2, controlled (HCC) E11.9   ??? Coronary artery disease involving native coronary artery of native heart without angina pectoris I25.10   ??? Dyslipidemia E78.5   ??? OSA on CPAP G47.33, Z99.89   ??? Severe obesity (HCC) E66.01   ??? Elevated sed rate R70.0   ??? Normocytic anemia D64.9   ??? Vitamin D deficiency E55.9  Past Medical History:   Diagnosis Date   ??? Abdominal pain 07/22/2015   ??? Anemia    ??? Back pain    ??? Bursitis of shoulder    ??? CAD (coronary artery disease)    ??? Cerumen impaction    ??? Chest pain, atypical 07/22/2015   ??? Diabetes ([place])     no meds, A1c 09/28/17 6.6; avg bs 105; denies ss of hypo   ??? Diabetes mellitus out of control ([place])    ??? Endocrine disease     thyroid   ??? Fatigue    ??? GERD (gastroesophageal reflux disease)     controlled with medicaitons   ??? Hip pain, right    ??? Hyperlipidemia, mild    ??? Hypertension     managed with medication   ??? Hypertension associated with diabetes ([place]) 07/22/2015   ??? 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 Left carotid bruit    ??? Lumbago 07/22/2015   ??? Morbid obesity ([place]) 07/22/2015    BMI 35.3   ??? OSA on CPAP 07/22/2015    uses CPAP   ??? Other ill-defined conditions(799.89)     cholesterol   ??? Other ill-defined conditions(799.89)     heart  cath 96   ??? Shoulder pain, acute    ??? Shoulder pain, bilateral    ??? Sinusitis, acute maxillary    ??? Unstable angina ([place]) 07/06/2015   ??? Vitamin D deficiency    ??? Vitamin D deficiency 04/12/2019   ??? Weakness      Past Surgical History:   Procedure Laterality Date   ??? COLONOSCOPY N/A 03/01/2018    COLONOSCOPY/BMI 39 performed by Mazanec, Dorene Ar, MD at [facility] ENDOSCOPY   ??? HX COLONOSCOPY     ??? HX HEART CATHETERIZATION  2016    without intervention   ??? HX HEENT      goiter/thyroid surgery   ??? HX THYROIDECTOMY       Family History   Problem Relation Age of Onset   ??? Stroke Sister    ??? Cancer Sister         cervical   ??? Stroke Brother    ??? Diabetes Other    ??? Hypertension Other      Social History     Tobacco Use   ??? Smoking status: Former Smoker     Packs/day: 0.50     Years: 10.00     Pack years: 5.00     Last attempt to quit: [place]     Years since quitting: 49.9   ??? Smokeless tobacco: Never Used   ??? Tobacco comment: quit in 1971   Substance Use Topics   ??? Alcohol use: No         Review of Systems  See above    OBJECTIVE:  Visit Vitals  BP (!) 176/80   Ht 4\' 11"  (1.499 m)   Wt 180 lb (81.6 kg)   BMI 36.36 kg/m??        Physical Exam  Constitutional:       Appearance: She is well-developed. She is obese.   HENT:      Head: Normocephalic and atraumatic.   Eyes:      Pupils: Pupils are equal, round, and reactive to light.   Neck:      Musculoskeletal: Normal range of motion and neck supple.      Thyroid: No thyromegaly.      Vascular: No JVD.   Cardiovascular:  Rate and Rhythm: Normal rate and regular rhythm.      Heart sounds: Normal heart sounds. No murmur. No friction rub. No gallop.    Pulmonary:      Effort: Pulmonary effort is normal. No respiratory distress.      Breath sounds: No wheezing or rales.   Abdominal:      Palpations: Abdomen is soft.      Tenderness: There is no abdominal tenderness. There is no guarding or rebound.   Musculoskeletal: Normal range of motion.      Comments: Patient has some palpable  discomfort along the left mid lateral chest wall.  No palpable abnormalities of her rib cage are noted.   Skin:     Findings: No rash.   Neurological:      Mental Status: She is alert and oriented to person, place, and time.         Medical problems and test results were reviewed with the patient today.         ASSESSMENT and PLAN    1.  Hypertension.  Blood pressure is 176/80.  I believe that the Zestoretic is most likely contributing to her urinary frequency.  Therefore I will use this opportunity to discontinue it to see if this will help her overactive bladder symptoms as well as to change her to something different for her blood pressure.  We will place her on Lotrel 5/20 and reevaluate blood pressure in 3 to 4 weeks.  She will continue to monitor outside the office.    2.  Dysphagia.  Continue follow-up with GI.    3.  Overactive bladder/urinary frequency.  Discontinue Vesicare.  Previous work-up including UA was clear.  Discontinue Zestoretic.    4.  Diabetes.  Most recent A1c was 6.5.  Continue current therapy.    5.  High cholesterol.  Most recent LDL was 80.    6.  Anemia of chronic disease.  Has been referred to hematology.    7.  Left lateral chest wall pain.  Treat with moist heat and Voltaren 75 mg twice a day for week.  If symptoms persist consider imaging.  May represent costochondritis.    Elements of this note have been dictated using speech recognition software. As a result, errors of speech recognition may have occurred.

## 2019-07-25 ENCOUNTER — Ambulatory Visit: Attending: Rheumatology | Primary: Family Medicine

## 2019-07-25 ENCOUNTER — Ambulatory Visit: Admit: 2019-07-25 | Discharge: 2019-07-25 | Payer: MEDICARE | Attending: Rheumatology | Primary: Family Medicine

## 2019-07-25 DIAGNOSIS — M353 Polymyalgia rheumatica: Secondary | ICD-10-CM

## 2019-07-25 MED ORDER — PREDNISONE 20 MG TAB
20 mg | ORAL_TABLET | ORAL | 0 refills | Status: DC
Start: 2019-07-25 — End: 2019-09-14

## 2019-07-25 NOTE — Progress Notes (Signed)
[facility]  Jeffie Pollock, M.D.  [address] [place] [address]  Office : [phone], Fax: [phone]     RHEUMATOLOGY OFFICE VISIT NOTE  Date of Visit:  07/25/2019 8:52 AM    Patient Information:  Name:  Sarah Chavez  DOB:  1941-01-12  Age:  78 y.o.   Gender:  female      Ms. 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      Last visit: 04/25/2019      History of Present Illness: On talking to the patient today she states that she has been having right shoulder blade pain worse than the left shoulder blade. Some cough with no fever or chills with yellowish productive sputum. Has not had any shortness of breath with no chest pain with the cough though.  She states that she did have some neck stiffness for which she was seen in the ER for it.  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  Her chest x-ray showed multilevel degenerative changes of the spine.  Her current joint complaints are as mentioned below.    Since the last visit, patient is feeling "poor".      Pain: 9/10  Location: Some para spinal muscle with associated neck stiffness. Occasional frontal headaches. Some mid back and some lower back pain as well. Some right knee pain with swelling with occasional buckling with some warmth but no redness. Bilateral ankle pain with swelling with no buckling. Some pain and swelling of the feet on the dorsum with no overlying warmth and redness.   Quality:  Deep achy pain.   Modifying Factors:  1st thing in the morning and at the end of the day the pain and stiffness is the worst.   Associated Symptoms:  AM Stiffness: > 1 hour; No tingling, numbness or pain down the arms or legs except for constant tingling and numbness worse in the thumb and index fingers of both the hands and intermittent in the other three fingers of both the hands. Has had intermittent tingling and  numbness of her feet. Has difficulty opening jars and buttoning and unbuttoning. Needs help with dressing and undressing.   Fatigue: 6/10  MDHAQ: 1.6      Last TB screen:10+ years  TB result:negative  ??  Current dose of steroids:prednisone??7??mg??  How long on current dose of steroids:??10 days????  How long on continuous steroid therapy:3 - 4 ??months  ??  Past DMARDs, if applicable (methotrexate, plaquenil/hydroxychloroquine, sulfasalazine, Arava/leflunomide):none  ??  Past biologics,??if applicable??(enbrel, humira, simponi, cimzia, [place], [place], remicade, simponi aria, actemra, rituximab, Leonie Man, cosentyx):none  ??  Past NSAIDs, if applicable (motrin, aleve, naproxen, advil, ibuprofen, celebrex, voltaren/diclofenac, etc.)??ibuprofen.  ??  ??  Last BMD:??2013  Past osteoporosis drugs, if applicable (fosamax, actonel, boniva, reclast, prolia, forteo):none  ??  BMI:36.03  ??  Current exercise regimen, if GYI:RSWN  Current vitamin D dose:OTC VIT D   Current calcium dose:none  Fractures since last visit, if any:??none  ??        The patient otherwise has no significant interval changes in health or medical history to report.     History Reviewed:    Past Medical History  Past Medical History:   Diagnosis Date   ??? Abdominal pain 07/22/2015   ??? Anemia    ??? Back pain    ??? Bursitis of shoulder    ??? CAD (coronary  artery disease)    ??? Cerumen impaction    ??? Chest pain, atypical 07/22/2015   ??? Diabetes ([place])     no meds, A1c 09/28/17 6.6; avg bs 105; denies ss of hypo   ??? Diabetes mellitus out of control ([place])    ??? Endocrine disease     thyroid   ??? Fatigue    ??? GERD (gastroesophageal reflux disease)     controlled with medicaitons   ??? Hip pain, right    ??? Hyperlipidemia, mild    ??? Hypertension     managed with medication   ??? Hypertension associated with diabetes ([place]) 07/22/2015   ??? 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 Left carotid bruit    ??? Lumbago 07/22/2015   ??? Morbid obesity ([place]) 07/22/2015     BMI 35.3   ??? OSA on CPAP 07/22/2015    uses CPAP   ??? Other ill-defined conditions(799.89)     cholesterol   ??? Other ill-defined conditions(799.89)     heart cath 96   ??? Shoulder pain, acute    ??? Shoulder pain, bilateral    ??? Sinusitis, acute maxillary    ??? Unstable angina ([place]) 07/06/2015   ??? Vitamin D deficiency    ??? Vitamin D deficiency 04/12/2019   ??? Weakness        Past Surgical History  Past Surgical History:   Procedure Laterality Date   ??? COLONOSCOPY N/A 03/01/2018    COLONOSCOPY/BMI 39 performed by Mazanec, Dorene Ar, MD at [facility] ENDOSCOPY   ??? HX COLONOSCOPY     ??? HX HEART CATHETERIZATION  2016    without intervention   ??? HX HEENT      goiter/thyroid surgery   ??? HX THYROIDECTOMY         Family History  Family History   Problem Relation Age of Onset   ??? Stroke Sister    ??? Cancer Sister         cervical   ??? Stroke Brother    ??? Diabetes Other    ??? Hypertension Other        Social History  Social History     Socioeconomic History   ??? Marital status: SINGLE     Spouse name: Not on file   ??? Number of children: Not on file   ??? Years of education: Not on file   ??? Highest education level: Not on file   Tobacco Use   ??? Smoking status: Former Smoker     Packs/day: 0.50     Years: 10.00     Pack years: 5.00     Start date: 1961     Last attempt to quit: 1971     Years since quitting: 49.9   ??? Smokeless tobacco: Never Used   ??? Tobacco comment: quit in 1971   Substance and Sexual Activity   ??? Alcohol use: No   ??? Drug use: No   Social History Narrative    ** Merged History Encounter **                    Allergy:  Allergies   Allergen Reactions   ??? Ativan [Lorazepam] Other (comments)     Made her feel crazy and chest pressure   ??? Ativan [Lorazepam] Other (comments)     Chest fullness   ??? Other Medication Palpitations     Some type of decongestant   ??? Other Medication Other (comments)  Doesn't take decongestants, but can't recall why         Current Medications:  Outpatient Encounter Medications as of 07/25/2019    Medication Sig Dispense Refill   ??? predniSONE (DELTASONE) 20 mg tablet Take 1 pill once a day after breakfast for 6 weeks and then 1/2 a pill once a day after breakfast for 6 weeks and then stop. 60 Tab 0   ??? amLODIPine-benazepril (LotreL) 5-20 mg per capsule Take 1 Cap by mouth daily. 90 Cap 3   ??? glipiZIDE SR (GLUCOTROL XL) 2.5 mg CR tablet TAKE 1 TABLET BY MOUTH DAILY 30 Tab 5   ??? cholecalciferol (Vitamin D3) (2,000 UNITS /50 MCG) cap capsule TAKE 1 CAPSULE BY MOUTH EVERY DAY 30 Cap 5   ??? pantoprazole (PROTONIX) 40 mg tablet Take 1 Tab by mouth daily. 30 Tab 5   ??? pravastatin (PRAVACHOL) 80 mg tablet Take 1 Tab by mouth daily. 90 Tab 3   ??? famotidine (PEPCID) 20 mg tablet take 1 tablet by mouth once daily 90 Tab 3   ??? folic acid 102 mcg tablet Take 400 mcg by mouth daily.     ??? nitroglycerin (NITROSTAT) 0.4 mg SL tablet Place 1 sl under the tongue q 5 min prn cp, max 3 sl in a 15-min time period. Call 911 if no relief after the 3rd sl. 1 Bottle prn   ??? aspirin 81 mg chewable tablet Take 1 Tab by mouth daily. 30 Tab 11   ??? [DISCONTINUED] diclofenac EC (VOLTAREN) 75 mg EC tablet Take 1 Tab by mouth two (2) times a day for 7 days. 14 Tab 0     No facility-administered encounter medications on file as of 07/25/2019.            REVIEW OF SYSTEMS: The following systems were reviewed with patient today and were negative except for the following (depicted with an "X"):        "X" General  "X" Head and Neck  "X" Heart and Breathing  "X" Gastrointestinal    Fever/chills   Hair loss   Shortness of breath  x Upset stomach    Falls   Dry mouth  x Coughing   Diarrhea / constipation   x Wt loss   Mouth sores  x Wheezing   Heartburn    Wt gain   Ringing ears   Chest pain   Dark or bloody stools    Night sweats   Diff. swallowing   None of above   Nausea or vomiting    None of above  X None of above      None of above                "X" Skin  "X" Neurology  "X" Urinary/Gyn  "X" Other     Easy bruising  x Numbness/ tingling   Female problems   Depression    Rashes   Weakness  x Problems with urination   Feeling anxious    Sun sensitivity  x Headaches   None of above   Problems sleeping   X None of above   None of above     X None of above          Physical Exam:  Blood pressure (!) 135/45, pulse 85, height '4\' 11"'  (1.499 m), weight 178 lb 6.4 oz (80.9 kg).  General:  Patient alert, cooperative and in no apparent distress.  HEENT: Pupils equally reactive to light and accommodation, minimal  scleral injection noted.  No temporal tenderness on palpation of the temples with no jaw pain on opening and closing of her mouth.  Heart: Regular rate and rhythm, normal S1 and S2, no rubs or gallops.  Lungs: Clear to auscultation bilaterally.  Abdomen: Soft, nontender, no hepatosplenomegaly.  Skin:  No rashes. No nail abnormalities.  Neurologic:  Oriented, normal speech and affect.  Normal gait.    Extremities:  No edema in bilateral lower extremities with no cyanosis or clubbing.    Muskoskeletal Exam:     I examined the shoulders, elbows, wrists, MCPs, PIPs, DIPs and knees bilaterally for strength, range of motion, deformity, tenderness, swelling, and synovitis.      The findings are: Noted tenderness on palpation of the right 2nd-5th MCP as well as PIP joints and the left 2nd-5th PIP joints with no synovitis, warmth or redness.  Her ability to flex and extend the fingers in both the hands is intact but she does have a weak grip.  Does have tenderness on palpation of the [facility] joint of both thumbs with noted squaring but no synovitis, warmth or redness.  Does have tenderness on palpation of the shoulders both anteriorly as well as superiorly with intact range of motion to abduction as well as internal rotation.  Does have tenderness on palpation of the C-spine as well as the paraspinal muscles of the neck with tenderness on palpation of the L-spine with bilateral SI joint  tenderness.  Does have tenderness on palpation of the knees in the mid joint line worse in the right knee than the left knee with joint crepitus on flexion as well as extension of the knees with trace synovitis but no overlying warmth or redness.  Does have tenderness on palpation of the ankles laterally with trace synovitis but no overlying warmth or redness.  Does have metatarsalgia in both the feet with no tenderness, synovitis, warmth or redness involving the right or left first through fifth MTP joints.    Patient otherwise has a normal joint exam without other evidence of joint tenderness, synovitis, warmth, erythema, decreased ROM, weakness or deformities.         Radiology Reports Reviewed (if available):  Last 3 months  Xr Chest Pa Lat    Result Date: 06/13/2019  EXAM: CHEST X-RAY, 2 VIEWS INDICATION: chest pain COMPARISON: Chest x-ray 04/02/2018 TECHNIQUE: Frontal and lateral views of the chest were obtained.  FINDINGS: The lungs are clear and the pulmonary vasculature is normal. No pneumothorax or pleural effusion. The cardiac silhouette is enlarged. Aortic atherosclerotic ossifications. Multilevel degenerative changes of the spine.     IMPRESSION: No radiographic evidence of acute cardiopulmonary disease.     Xr Shoulder Rt Ap/lat Min 2 V    Result Date: 06/13/2019  Right shoulder radiographs, 3 views INDICATION: Right shoulder pain. COMPARISON: None. FINDINGS: No fractures evident. Osteoarthritic changes of the acromioclavicular and glenohumeral joints. Significant subacromial spurring noted. The soft tissues have an unremarkable appearance. Multiple surgical clips in the neck base.     IMPRESSION: 1.  No acute radiographic abnormality. 2.  Degenerative changes of the glenohumeral and acromioclavicular joints with significant subacromial spurring.    Xr Ba Swallow Esophogram    Result Date: 05/04/2019  BARIUM SWALLOW ESOPHAGRAM 05/04/2019 HISTORY: 78 year old female presents  with complaints of dysphasia with solids and liquids. History of thyroid disease, gastroesophageal reflux, diabetes, hypertension and abdominal pain. TECHNIQUE: The patient received 1.5 minutes fluoroscopy time. 7 spot fluoroscopic images were obtained along with the  radiographs. The patient ingested effervescent granules followed by thin and thick barium under direct fluoroscopic observation. COMPARISON: CT abdomen and pelvis April 02, 2018 FINDINGS: Rapid sequence imaging of the oropharynx demonstrated a normal swallowing motion with a prominent cricopharyngeus muscle impression along with a small indentation on the ventral surface of the esophagus suggestive of a small vascular plexus or web. There was mild smooth narrowing of the distal esophagus. The gastroesophageal junction was in a normal position. A 12.5 mm barium tablet passed into the stomach after minimal transient delay at the gastroesophageal junction. The esophageal mucosa was not adequately evaluated on this study. The cardiac silhouette is prominent.     IMPRESSION: 1. No obstructing esophageal lesions. 2. Prominent cricopharyngeus muscle impression.      Lab Reports Reviewed (if available): Last 3 months    Admission on 06/13/2019, Discharged on 06/13/2019   Component Date Value Ref Range Status   ??? Ventricular Rate 06/13/2019 76  BPM Final   ??? Atrial Rate 06/13/2019 76  BPM Final   ??? P-R Interval 06/13/2019 212  ms Final   ??? QRS Duration 06/13/2019 86  ms Final   ??? Q-T Interval 06/13/2019 396  ms Final   ??? QTC Calculation ([place]) 06/13/2019 445  ms Final   ??? Calculated P Axis 06/13/2019 26  degrees Final   ??? Calculated R Axis 06/13/2019 32  degrees Final   ??? Calculated T Axis 06/13/2019 86  degrees Final   ??? Diagnosis 06/13/2019    Final                    Value:Sinus rhythm with 1st degree A-V block with Premature atrial complexes  ST abnormality, possible digitalis effect  Abnormal ECG  When compared with ECG of 02-Apr-2018 13:49,   Premature atrial complexes are now Present  Confirmed by CEBE  MD ([facility]), JOHN E [phone]) on 06/13/2019 2:01:04 PM     ??? WBC 06/13/2019 9.2  4.3 - 11.1 K/uL Final   ??? RBC 06/13/2019 3.43* 4.05 - 5.2 M/uL Final   ??? HGB 06/13/2019 8.9* 11.7 - 15.4 g/dL Final   ??? HCT 06/13/2019 28.4* 35.8 - 46.3 % Final   ??? MCV 06/13/2019 82.8  79.6 - 97.8 FL Final   ??? MCH 06/13/2019 25.9* 26.1 - 32.9 PG Final   ??? MCHC 06/13/2019 31.3* 31.4 - 35.0 g/dL Final   ??? RDW 06/13/2019 16.0* 11.9 - 14.6 % Final   ??? PLATELET 06/13/2019 249  150 - 450 K/uL Final   ??? MPV 06/13/2019 10.2  9.4 - 12.3 FL Final   ??? ABSOLUTE NRBC 06/13/2019 0.00  0.0 - 0.2 K/uL Final    **Note: Absolute NRBC parameter is now reported with Hemogram**   ??? DF 06/13/2019 AUTOMATED    Final   ??? NEUTROPHILS 06/13/2019 69  43 - 78 % Final   ??? LYMPHOCYTES 06/13/2019 18  13 - 44 % Final   ??? MONOCYTES 06/13/2019 10  4.0 - 12.0 % Final   ??? EOSINOPHILS 06/13/2019 2  0.5 - 7.8 % Final   ??? BASOPHILS 06/13/2019 0  0.0 - 2.0 % Final   ??? IMMATURE GRANULOCYTES 06/13/2019 0  0.0 - 5.0 % Final   ??? ABS. NEUTROPHILS 06/13/2019 6.4  1.7 - 8.2 K/UL Final   ??? ABS. LYMPHOCYTES 06/13/2019 1.7  0.5 - 4.6 K/UL Final   ??? ABS. MONOCYTES 06/13/2019 1.0  0.1 - 1.3 K/UL Final   ??? ABS. EOSINOPHILS 06/13/2019 0.2  0.0 -  0.8 K/UL Final   ??? ABS. BASOPHILS 06/13/2019 0.0  0.0 - 0.2 K/UL Final   ??? ABS. IMM. GRANS. 06/13/2019 0.0  0.0 - 0.5 K/UL Final   ??? Sodium 06/13/2019 138  136 - 145 mmol/L Final   ??? Potassium 06/13/2019 4.4  3.5 - 5.1 mmol/L Final   ??? Chloride 06/13/2019 105  98 - 107 mmol/L Final   ??? CO2 06/13/2019 26  21 - 32 mmol/L Final   ??? Anion gap 06/13/2019 7  7 - 16 mmol/L Final   ??? Glucose 06/13/2019 107* 65 - 100 mg/dL Final    Comment: 47 - 60 mg/dl Consistent with, but not fully diagnostic of hypoglycemia.  101 - 125 mg/dl Impaired fasting glucose/consistent with pre-diabetes mellitus  > 126 mg/dl Fasting glucose consistent with overt diabetes mellitus      ??? BUN 06/13/2019 14  8 - 23 MG/DL Final   ??? Creatinine 06/13/2019 1.12* 0.6 - 1.0 MG/DL Final   ??? GFR est AA 06/13/2019 >60  >60 ml/min/1.88m Final   ??? GFR est non-AA 06/13/2019 50* >60 ml/min/1.780mFinal    Comment: (NOTE)  Estimated GFR is calculated using the Modification of Diet in Renal   Disease (MDRD) Study equation, reported for both African Americans   (GFRAA) and non-African Americans (GFRNA), and normalized to 1.7375m body surface area. The physician must decide which value applies to   the patient. The MDRD study equation should only be used in   individuals age 37 63 older. It has not been validated for the   following: pregnant women, patients with serious comorbid conditions,   or on certain medications, or persons with extremes of body size,   muscle mass, or nutritional status.     ??? Calcium 06/13/2019 9.6  8.3 - 10.4 MG/DL Final   ??? Bilirubin, total 06/13/2019 0.5  0.2 - 1.1 MG/DL Final   ??? ALT (SGPT) 06/13/2019 14  12 - 65 U/L Final   ??? AST (SGOT) 06/13/2019 21  15 - 37 U/L Final   ??? Alk. phosphatase 06/13/2019 62  50 - 130 U/L Final   ??? Protein, total 06/13/2019 8.6* 6.3 - 8.2 g/dL Final   ??? Albumin 06/13/2019 2.9* 3.2 - 4.6 g/dL Final   ??? Globulin 06/13/2019 5.7* 2.3 - 3.5 g/dL Final   ??? A-G Ratio 06/13/2019 0.5* 1.2 - 3.5   Final   ??? Color 06/13/2019 YELLOW    Final   ??? Appearance 06/13/2019 CLEAR    Final   ??? Specific gravity 06/13/2019 1.008  1.001 - 1.023   Final   ??? pH (UA) 06/13/2019 6.0  5.0 - 9.0   Final   ??? Protein 06/13/2019 Negative  NEG mg/dL Final   ??? Glucose 06/13/2019 Negative  mg/dL Final   ??? Ketone 06/13/2019 Negative  NEG mg/dL Final   ??? Bilirubin 06/13/2019 Negative  NEG   Final   ??? Blood 06/13/2019 Negative  NEG   Final   ??? Urobilinogen 06/13/2019 0.2  0.2 - 1.0 EU/dL Final   ??? Nitrites 06/13/2019 Negative  NEG   Final   ??? Leukocyte Esterase 06/13/2019 Negative  NEG   Final   Hospital Outpatient Visit on 05/16/2019   Component Date Value Ref Range Status    ??? Creatinine (POC) 05/16/2019 1.3  0.8 - 1.5 mg/dL Final   ??? GFRAA, POC 05/16/2019 51* >60 ml/min/1.52m28mnal   ??? GFRNA, POC 05/16/2019 42* >60 ml/min/1.52m269mal    Comment: (NOTE)  Estimated GFR is calculated using the Modification of Diet in Renal   Disease (MDRD) Study Equation, reported for both African Americans   (GFRAA) and non-African Americans (GFRNA), and normalized to 1.14m   body surface area. The physician must decide which value applies to   the patient. The MDRD study equation should only be used in   individuals age 5784or older. It has not been validated for the   following: pregnant women, patients with serious comorbid conditions,   or on certain medications, or persons with extremes of body size,   muscle mass, or nutritional status.     Office Visit on 04/25/2019   Component Date Value Ref Range Status   ??? C-Reactive Protein, Qt 04/25/2019 81* 0 - 10 mg/L Final   ??? Sed rate (ESR) 04/25/2019 86* 0 - 40 mm/hr Final         The results above were reviewed and discussed with patient.         Assessment/Plan:   VShany Marinezis a 78y.o. female who presents with:     1. PMR ([place]algia [place]a) [place]: In light of her CRP and ESR being elevated when checked by me the last time and recurrence of her symptoms I did start her on prednisone 20 mg to take 1 pill once a day for 6 weeks and then half a pill once a day for the following 6 weeks.  She will remain on prednisone 10 mg daily until her follow-up visit with me.  If she does have recurrence of temporal headaches or jaw pain with double vision or blurring of vision she was instructed to call our office to be seen sooner than her scheduled appointment with me.  Patient did verbalize understanding to all of this.  -     predniSONE (DELTASONE) 20 mg tablet; Take 1 pill once a day after breakfast for 6 weeks and then 1/2 a pill once a day after breakfast for 6 weeks and then stop.    Disease activity plan:  As stated above.     Steroid management plan:  As stated above, if applicable.    Pain management plan:  As stated above, if applicable.    Weight management plan:  Weight loss through diet and exercise is always encouraged    Disease prognosis: Good        I appreciate the opportunity to continue to participate in the care of this patient.     Follow-up and Dispositions    ?? Return in about 10 weeks (around 10/03/2019).       Electronically signed by:  MJeffie Pollock MD      This note was dictated using dragon voice recognition software.  It has been proofread, but there may still exist voice recognition errors that the author did not detect.                --------------------------------------------------------------------------------------------------------------------------------------------------------------------------------------------------------------------------------

## 2019-07-25 NOTE — Progress Notes (Signed)
[facility]  Jeffie Pollock, M.D.  [address] [place] [address]  Office : [phone], Fax: [phone]     RHEUMATOLOGY OFFICE VISIT NOTE  Date of Visit:  07/25/2019 8:52 AM    Patient Information:  Name:  Sarah Chavez  DOB:  1941-01-24  Age:  78 y.o.   Gender:  female      Sarah Chavez is here today for follow-up of PMR.      Last visit: 04/25/2019      History of Present Illness: On talking to the patient today she states that she has been having right shoulder blade pain worse than the left shoulder blade. Some cough with no fever or chills with yellowish productive sputum. Has not had any shortness of breath with no chest pain with the cough though.  She states that she did have some neck stiffness for which she was seen in the ER for it.  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  Her chest x-ray showed multilevel degenerative changes of the spine.  Her current joint complaints are as mentioned below.    Since the last visit, patient is feeling "poor".      Pain: 9/10  Location: Some para spinal muscle with associated neck stiffness. Occasional frontal headaches. Some mid back and some lower back pain as well. Some right knee pain with swelling with occasional buckling with some warmth but no redness. Bilateral ankle pain with swelling with no buckling. Some pain and swelling of the feet on the dorsum with no overlying warmth and redness.   Quality:  Deep achy pain.   Modifying Factors:  1st thing in the morning and at the end of the day the pain and stiffness is the worst.   Associated Symptoms:  AM Stiffness: > 1 hour; No tingling, numbness or pain down the arms or legs except for constant tingling and numbness worse in the thumb and index fingers of both the hands and intermittent in the other three fingers of both the hands. Has had intermittent tingling and numbness  of her feet. Has difficulty opening jars and buttoning and unbuttoning. Needs help with dressing and undressing.   Fatigue: 6/10  MDHAQ: 1.6      Last TB screen:10+ years  TB result:negative  ??  Current dose of steroids:prednisone??7??mg??  How long on current dose of steroids:??10 days????  How long on continuous steroid therapy:3 - 4 ??months  ??  Past DMARDs, if applicable (methotrexate, plaquenil/hydroxychloroquine, sulfasalazine, Arava/leflunomide):none  ??  Past biologics,??if applicable??(enbrel, humira, simponi, cimzia, [place], [place], remicade, simponi aria, actemra, rituximab, [place]ie [place], cosentyx):none  ??  Past NSAIDs, if applicable (motrin, aleve, naproxen, advil, ibuprofen, celebrex, voltaren/diclofenac, etc.)??ibuprofen.  ??  ??  Last BMD:??2013  Past osteoporosis drugs, if applicable (fosamax, actonel, boniva, reclast, prolia, forteo):none  ??  BMI:36.03  ??  Current exercise regimen, if QPY:PPJK  Current vitamin D dose:OTC VIT D   Current calcium dose:none  Fractures since last visit, if any:??none  ??        The patient otherwise has no significant interval changes in health or medical history to report.     History Reviewed:    Past Medical History  Past Medical History:   Diagnosis Date   ??? Abdominal pain 07/22/2015   ??? Anemia    ??? Back pain    ??? Bursitis of shoulder    ??? CAD (coronary  artery disease)    ??? Cerumen impaction    ??? Chest pain, atypical 07/22/2015   ??? Diabetes ([place])     no meds, A1c 09/28/17 6.6; avg bs 105; denies ss of hypo   ??? Diabetes mellitus out of control ([place])    ??? Endocrine disease     thyroid   ??? Fatigue    ??? GERD (gastroesophageal reflux disease)     controlled with medicaitons   ??? Hip pain, right    ??? Hyperlipidemia, mild    ??? Hypertension     managed with medication   ??? Hypertension associated with diabetes ([place]) 07/22/2015   ??? 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 Left carotid bruit    ??? Lumbago 07/22/2015   ??? Morbid obesity ([place]) 07/22/2015    BMI 35.3   ??? OSA  on CPAP 07/22/2015    uses CPAP   ??? Other ill-defined conditions(799.89)     cholesterol   ??? Other ill-defined conditions(799.89)     heart cath 96   ??? Shoulder pain, acute    ??? Shoulder pain, bilateral    ??? Sinusitis, acute maxillary    ??? Unstable angina ([place]) 07/06/2015   ??? Vitamin D deficiency    ??? Vitamin D deficiency 04/12/2019   ??? Weakness        Past Surgical History  Past Surgical History:   Procedure Laterality Date   ??? COLONOSCOPY N/A 03/01/2018    COLONOSCOPY/BMI 39 performed by Mazanec, Dorene Ar, MD at [facility] ENDOSCOPY   ??? HX COLONOSCOPY     ??? HX HEART CATHETERIZATION  2016    without intervention   ??? HX HEENT      goiter/thyroid surgery   ??? HX THYROIDECTOMY         Family History  Family History   Problem Relation Age of Onset   ??? Stroke Sister    ??? Cancer Sister         cervical   ??? Stroke Brother    ??? Diabetes Other    ??? Hypertension Other        Social History  Social History     Socioeconomic History   ??? Marital status: SINGLE     Spouse name: Not on file   ??? Number of children: Not on file   ??? Years of education: Not on file   ??? Highest education level: Not on file   Tobacco Use   ??? Smoking status: Former Smoker     Packs/day: 0.50     Years: 10.00     Pack years: 5.00     Start date: 1961     Last attempt to quit: 1971     Years since quitting: 49.9   ??? Smokeless tobacco: Never Used   ??? Tobacco comment: quit in 1971   Substance and Sexual Activity   ??? Alcohol use: No   ??? Drug use: No   Social History Narrative    ** Merged History Encounter **                    Allergy:  Allergies   Allergen Reactions   ??? Ativan [Lorazepam] Other (comments)     Made her feel crazy and chest pressure   ??? Ativan [Lorazepam] Other (comments)     Chest fullness   ??? Other Medication Palpitations     Some type of decongestant   ??? Other Medication Other (comments)  Doesn't take decongestants, but can't recall why         Current Medications:  Outpatient Encounter Medications as of 07/25/2019   Medication Sig Dispense Refill    ??? predniSONE (DELTASONE) 20 mg tablet Take 1 pill once a day after breakfast for 6 weeks and then 1/2 a pill once a day after breakfast for 6 weeks and then stop. 60 Tab 0   ??? amLODIPine-benazepril (LotreL) 5-20 mg per capsule Take 1 Cap by mouth daily. 90 Cap 3   ??? glipiZIDE SR (GLUCOTROL XL) 2.5 mg CR tablet TAKE 1 TABLET BY MOUTH DAILY 30 Tab 5   ??? cholecalciferol (Vitamin D3) (2,000 UNITS /50 MCG) cap capsule TAKE 1 CAPSULE BY MOUTH EVERY DAY 30 Cap 5   ??? pantoprazole (PROTONIX) 40 mg tablet Take 1 Tab by mouth daily. 30 Tab 5   ??? pravastatin (PRAVACHOL) 80 mg tablet Take 1 Tab by mouth daily. 90 Tab 3   ??? famotidine (PEPCID) 20 mg tablet take 1 tablet by mouth once daily 90 Tab 3   ??? folic acid 956 mcg tablet Take 400 mcg by mouth daily.     ??? nitroglycerin (NITROSTAT) 0.4 mg SL tablet Place 1 sl under the tongue q 5 min prn cp, max 3 sl in a 15-min time period. Call 911 if no relief after the 3rd sl. 1 Bottle prn   ??? aspirin 81 mg chewable tablet Take 1 Tab by mouth daily. 30 Tab 11   ??? [DISCONTINUED] diclofenac EC (VOLTAREN) 75 mg EC tablet Take 1 Tab by mouth two (2) times a day for 7 days. 14 Tab 0     No facility-administered encounter medications on file as of 07/25/2019.            REVIEW OF SYSTEMS: The following systems were reviewed with patient today and were negative except for the following (depicted with an "X"):        "X" General  "X" Head and Neck  "X" Heart and Breathing  "X" Gastrointestinal    Fever/chills   Hair loss   Shortness of breath  x Upset stomach    Falls   Dry mouth  x Coughing   Diarrhea / constipation   x Wt loss   Mouth sores  x Wheezing   Heartburn    Wt gain   Ringing ears   Chest pain   Dark or bloody stools    Night sweats   Diff. swallowing   None of above   Nausea or vomiting    None of above  X None of above      None of above                "X" Skin  "X" Neurology  "X" Urinary/Gyn  "X" Other    Easy bruising  x Numbness/ tingling   Female problems   Depression     Rashes   Weakness  x Problems with urination   Feeling anxious    Sun sensitivity  x Headaches   None of above   Problems sleeping   X None of above   None of above     X None of above          Physical Exam:  Blood pressure (!) 135/45, pulse 85, height 4' 11"  (1.499 m), weight 178 lb 6.4 oz (80.9 kg).  General:  Patient alert, cooperative and in no apparent distress.  HEENT: Pupils equally reactive to light and accommodation, minimal  scleral injection noted.  No temporal tenderness on palpation of the temples with no jaw pain on opening and closing of her mouth.  Heart: Regular rate and rhythm, normal S1 and S2, no rubs or gallops.  Lungs: Clear to auscultation bilaterally.  Abdomen: Soft, nontender, no hepatosplenomegaly.  Skin:  No rashes. No nail abnormalities.  Neurologic:  Oriented, normal speech and affect.  Normal gait.    Extremities:  No edema in bilateral lower extremities with no cyanosis or clubbing.    Muskoskeletal Exam:     I examined the shoulders, elbows, wrists, MCPs, PIPs, DIPs and knees bilaterally for strength, range of motion, deformity, tenderness, swelling, and synovitis.      The findings are: Noted tenderness on palpation of the right 2nd-5th MCP as well as PIP joints and the left 2nd-5th PIP joints with no synovitis, warmth or redness.  Her ability to flex and extend the fingers in both the hands is intact but she does have a weak grip.  Does have tenderness on palpation of the [facility] joint of both thumbs with noted squaring but no synovitis, warmth or redness.  Does have tenderness on palpation of the shoulders both anteriorly as well as superiorly with intact range of motion to abduction as well as internal rotation.  Does have tenderness on palpation of the C-spine as well as the paraspinal muscles of the neck with tenderness on palpation of the L-spine with bilateral SI joint tenderness.  Does have tenderness on palpation of the knees in the mid joint line worse in the right knee than  the left knee with joint crepitus on flexion as well as extension of the knees with trace synovitis but no overlying warmth or redness.  Does have tenderness on palpation of the ankles laterally with trace synovitis but no overlying warmth or redness.  Does have metatarsalgia in both the feet with no tenderness, synovitis, warmth or redness involving the right or left first through fifth MTP joints.    Patient otherwise has a normal joint exam without other evidence of joint tenderness, synovitis, warmth, erythema, decreased ROM, weakness or deformities.         Radiology Reports Reviewed (if available):  Last 3 months  Xr Chest Pa Lat    Result Date: 06/13/2019  EXAM: CHEST X-RAY, 2 VIEWS INDICATION: chest pain COMPARISON: Chest x-ray 04/02/2018 TECHNIQUE: Frontal and lateral views of the chest were obtained.  FINDINGS: The lungs are clear and the pulmonary vasculature is normal. No pneumothorax or pleural effusion. The cardiac silhouette is enlarged. Aortic atherosclerotic ossifications. Multilevel degenerative changes of the spine.     IMPRESSION: No radiographic evidence of acute cardiopulmonary disease.     Xr Shoulder Rt Ap/lat Min 2 V    Result Date: 06/13/2019  Right shoulder radiographs, 3 views INDICATION: Right shoulder pain. COMPARISON: None. FINDINGS: No fractures evident. Osteoarthritic changes of the acromioclavicular and glenohumeral joints. Significant subacromial spurring noted. The soft tissues have an unremarkable appearance. Multiple surgical clips in the neck base.     IMPRESSION: 1.  No acute radiographic abnormality. 2.  Degenerative changes of the glenohumeral and acromioclavicular joints with significant subacromial spurring.    Xr Ba Swallow Esophogram    Result Date: 05/04/2019  BARIUM SWALLOW ESOPHAGRAM 05/04/2019 HISTORY: 78 year old female presents with complaints of dysphasia with solids and liquids. History of thyroid disease, gastroesophageal reflux, diabetes, hypertension and abdominal  pain. TECHNIQUE: The patient received 1.5 minutes fluoroscopy time. 7 spot fluoroscopic images were obtained along with the  radiographs. The patient ingested effervescent granules followed by thin and thick barium under direct fluoroscopic observation. COMPARISON: CT abdomen and pelvis April 02, 2018 FINDINGS: Rapid sequence imaging of the oropharynx demonstrated a normal swallowing motion with a prominent cricopharyngeus muscle impression along with a small indentation on the ventral surface of the esophagus suggestive of a small vascular plexus or web. There was mild smooth narrowing of the distal esophagus. The gastroesophageal junction was in a normal position. A 12.5 mm barium tablet passed into the stomach after minimal transient delay at the gastroesophageal junction. The esophageal mucosa was not adequately evaluated on this study. The cardiac silhouette is prominent.     IMPRESSION: 1. No obstructing esophageal lesions. 2. Prominent cricopharyngeus muscle impression.      Lab Reports Reviewed (if available): Last 3 months    Admission on 06/13/2019, Discharged on 06/13/2019   Component Date Value Ref Range Status   ??? Ventricular Rate 06/13/2019 76  BPM Final   ??? Atrial Rate 06/13/2019 76  BPM Final   ??? P-R Interval 06/13/2019 212  ms Final   ??? QRS Duration 06/13/2019 86  ms Final   ??? Q-T Interval 06/13/2019 396  ms Final   ??? QTC Calculation ([place]) 06/13/2019 445  ms Final   ??? Calculated P Axis 06/13/2019 26  degrees Final   ??? Calculated R Axis 06/13/2019 32  degrees Final   ??? Calculated T Axis 06/13/2019 86  degrees Final   ??? Diagnosis 06/13/2019    Final                    Value:Sinus rhythm with 1st degree A-V block with Premature atrial complexes  ST abnormality, possible digitalis effect  Abnormal ECG  When compared with ECG of 02-Apr-2018 13:49,  Premature atrial complexes are now Present  Confirmed by CEBE  MD ([facility]), JOHN E [phone]) on 06/13/2019 2:01:04 PM     ??? WBC 06/13/2019 9.2  4.3 - 11.1 K/uL Final    ??? RBC 06/13/2019 3.43* 4.05 - 5.2 M/uL Final   ??? HGB 06/13/2019 8.9* 11.7 - 15.4 g/dL Final   ??? HCT 06/13/2019 28.4* 35.8 - 46.3 % Final   ??? MCV 06/13/2019 82.8  79.6 - 97.8 FL Final   ??? MCH 06/13/2019 25.9* 26.1 - 32.9 PG Final   ??? MCHC 06/13/2019 31.3* 31.4 - 35.0 g/dL Final   ??? RDW 06/13/2019 16.0* 11.9 - 14.6 % Final   ??? PLATELET 06/13/2019 249  150 - 450 K/uL Final   ??? MPV 06/13/2019 10.2  9.4 - 12.3 FL Final   ??? ABSOLUTE NRBC 06/13/2019 0.00  0.0 - 0.2 K/uL Final    **Note: Absolute NRBC parameter is now reported with Hemogram**   ??? DF 06/13/2019 AUTOMATED    Final   ??? NEUTROPHILS 06/13/2019 69  43 - 78 % Final   ??? LYMPHOCYTES 06/13/2019 18  13 - 44 % Final   ??? MONOCYTES 06/13/2019 10  4.0 - 12.0 % Final   ??? EOSINOPHILS 06/13/2019 2  0.5 - 7.8 % Final   ??? BASOPHILS 06/13/2019 0  0.0 - 2.0 % Final   ??? IMMATURE GRANULOCYTES 06/13/2019 0  0.0 - 5.0 % Final   ??? ABS. NEUTROPHILS 06/13/2019 6.4  1.7 - 8.2 K/UL Final   ??? ABS. LYMPHOCYTES 06/13/2019 1.7  0.5 - 4.6 K/UL Final   ??? ABS. MONOCYTES 06/13/2019 1.0  0.1 - 1.3 K/UL Final   ??? ABS. EOSINOPHILS 06/13/2019 0.2  0.0 -  0.8 K/UL Final   ??? ABS. BASOPHILS 06/13/2019 0.0  0.0 - 0.2 K/UL Final   ??? ABS. IMM. GRANS. 06/13/2019 0.0  0.0 - 0.5 K/UL Final   ??? Sodium 06/13/2019 138  136 - 145 mmol/L Final   ??? Potassium 06/13/2019 4.4  3.5 - 5.1 mmol/L Final   ??? Chloride 06/13/2019 105  98 - 107 mmol/L Final   ??? CO2 06/13/2019 26  21 - 32 mmol/L Final   ??? Anion gap 06/13/2019 7  7 - 16 mmol/L Final   ??? Glucose 06/13/2019 107* 65 - 100 mg/dL Final    Comment: 47 - 60 mg/dl Consistent with, but not fully diagnostic of hypoglycemia.  101 - 125 mg/dl Impaired fasting glucose/consistent with pre-diabetes mellitus  > 126 mg/dl Fasting glucose consistent with overt diabetes mellitus     ??? BUN 06/13/2019 14  8 - 23 MG/DL Final   ??? Creatinine 06/13/2019 1.12* 0.6 - 1.0 MG/DL Final   ??? GFR est AA 06/13/2019 >60  >60 ml/min/1.31m Final   ??? GFR est non-AA 06/13/2019 50* >60  ml/min/1.719mFinal    Comment: (NOTE)  Estimated GFR is calculated using the Modification of Diet in Renal   Disease (MDRD) Study equation, reported for both African Americans   (GFRAA) and non-African Americans (GFRNA), and normalized to 1.7356m body surface area. The physician must decide which value applies to   the patient. The MDRD study equation should only be used in   individuals age 44 78 older. It has not been validated for the   following: pregnant women, patients with serious comorbid conditions,   or on certain medications, or persons with extremes of body size,   muscle mass, or nutritional status.     ??? Calcium 06/13/2019 9.6  8.3 - 10.4 MG/DL Final   ??? Bilirubin, total 06/13/2019 0.5  0.2 - 1.1 MG/DL Final   ??? ALT (SGPT) 06/13/2019 14  12 - 65 U/L Final   ??? AST (SGOT) 06/13/2019 21  15 - 37 U/L Final   ??? Alk. phosphatase 06/13/2019 62  50 - 130 U/L Final   ??? Protein, total 06/13/2019 8.6* 6.3 - 8.2 g/dL Final   ??? Albumin 06/13/2019 2.9* 3.2 - 4.6 g/dL Final   ??? Globulin 06/13/2019 5.7* 2.3 - 3.5 g/dL Final   ??? A-G Ratio 06/13/2019 0.5* 1.2 - 3.5   Final   ??? Color 06/13/2019 YELLOW    Final   ??? Appearance 06/13/2019 CLEAR    Final   ??? Specific gravity 06/13/2019 1.008  1.001 - 1.023   Final   ??? pH (UA) 06/13/2019 6.0  5.0 - 9.0   Final   ??? Protein 06/13/2019 Negative  NEG mg/dL Final   ??? Glucose 06/13/2019 Negative  mg/dL Final   ??? Ketone 06/13/2019 Negative  NEG mg/dL Final   ??? Bilirubin 06/13/2019 Negative  NEG   Final   ??? Blood 06/13/2019 Negative  NEG   Final   ??? Urobilinogen 06/13/2019 0.2  0.2 - 1.0 EU/dL Final   ??? Nitrites 06/13/2019 Negative  NEG   Final   ??? Leukocyte Esterase 06/13/2019 Negative  NEG   Final   Hospital Outpatient Visit on 05/16/2019   Component Date Value Ref Range Status   ??? Creatinine (POC) 05/16/2019 1.3  0.8 - 1.5 mg/dL Final   ??? GFRAA, POC 05/16/2019 51* >60 ml/min/1.14m42mnal   ??? GFRNA, POC 05/16/2019 42* >60 ml/min/1.14m216mal    Comment: (NOTE)  Estimated  GFR is  calculated using the Modification of Diet in Renal   Disease (MDRD) Study Equation, reported for both African Americans   (GFRAA) and non-African Americans (GFRNA), and normalized to 1.31m   body surface area. The physician must decide which value applies to   the patient. The MDRD study equation should only be used in   individuals age 5269or older. It has not been validated for the   following: pregnant women, patients with serious comorbid conditions,   or on certain medications, or persons with extremes of body size,   muscle mass, or nutritional status.     Office Visit on 04/25/2019   Component Date Value Ref Range Status   ??? C-Reactive Protein, Qt 04/25/2019 81* 0 - 10 mg/L Final   ??? Sed rate (ESR) 04/25/2019 86* 0 - 40 mm/hr Final         The results above were reviewed and discussed with patient.         Assessment/Plan:   Sarah Stotleris a 78y.o. female who presents with:     1. PMR (polymyalgia rheumatica) ([place]: In light of her CRP and ESR being elevated when checked by me the last time and recurrence of her symptoms I did start her on prednisone 20 mg to take 1 pill once a day for 6 weeks and then half a pill once a day for the following 6 weeks.  She will remain on prednisone 10 mg daily until her follow-up visit with me.  If she does have recurrence of temporal headaches or jaw pain with double vision or blurring of vision she was instructed to call our office to be seen sooner than her scheduled appointment with me.  Patient did verbalize understanding to all of this.  -     predniSONE (DELTASONE) 20 mg tablet; Take 1 pill once a day after breakfast for 6 weeks and then 1/2 a pill once a day after breakfast for 6 weeks and then stop.    Disease activity plan:  As stated above.    Steroid management plan:  As stated above, if applicable.    Pain management plan:  As stated above, if applicable.    Weight management plan:  Weight loss through diet and exercise is always encouraged    Disease  prognosis: Good        I appreciate the opportunity to continue to participate in the care of this patient.     Follow-up and Dispositions    ?? Return in about 10 weeks (around 10/03/2019).       Electronically signed by:  MJeffie Pollock MD      This note was dictated using dragon voice recognition software.  It has been proofread, but there may still exist voice recognition errors that the author did not detect.                --------------------------------------------------------------------------------------------------------------------------------------------------------------------------------------------------------------------------------

## 2019-07-27 MED ORDER — DICLOFENAC 75 MG TAB, DELAYED RELEASE
75 mg | ORAL_TABLET | ORAL | 0 refills | Status: DC
Start: 2019-07-27 — End: 2019-09-14

## 2019-08-15 ENCOUNTER — Ambulatory Visit: Attending: Family Medicine | Primary: Family Medicine

## 2019-08-15 ENCOUNTER — Ambulatory Visit: Admit: 2019-08-15 | Discharge: 2019-08-15 | Payer: MEDICARE | Attending: Family Medicine | Primary: Family Medicine

## 2019-08-15 DIAGNOSIS — N3281 Overactive bladder: Secondary | ICD-10-CM

## 2019-08-15 MED ORDER — LISINOPRIL 20 MG TAB
20 mg | ORAL_TABLET | Freq: Every day | ORAL | 3 refills | Status: DC
Start: 2019-08-15 — End: 2019-10-18

## 2019-08-15 NOTE — Progress Notes (Signed)
SUBJECTIVE:   Sarah Chavez is a 78 y.o. female who has a past medical history significant for hypertension, diabetes, high cholesterol, polymyalgia rheumatica, overactive bladder.  At previous visit the patient had been noted to have consistent increases in her blood pressure and was complaining of ongoing struggles with her frequency of urination/overactive bladder.  As a result I discontinued her Zestoretic and changed her to Lotrel 5/20.  She reports a significant improvement in her overactive bladder symptoms.  She has been monitoring her blood pressure and states that although better it still mildly to moderately elevated ranging 160-170/90.  She is also been experiencing dysphagia and is being evaluated by GI.  Please refer to prior notes for details.  She has been placed on prednisone recently by her rheumatologist for her PMR.  She states that her joint pains and headaches are much better.  Patient denies chest pain, shortness of breath, orthopnea or PND.  GI and GU review of systems as above.    HPI  See above    Past Medical History, Past Surgical History, Family history, Social History, and Medications were all reviewed with the patient today and updated as necessary.       Current Outpatient Medications   Medication Sig Dispense Refill   ??? lisinopriL (PRINIVIL, ZESTRIL) 20 mg tablet Take 1 Tab by mouth daily. 90 Tab 3   ??? diclofenac EC (VOLTAREN) 75 mg EC tablet TAKE 1 TABLET BY MOUTH TWICE DAILY FOR 7 DAYS 14 Tab 0   ??? predniSONE (DELTASONE) 20 mg tablet Take 1 pill once a day after breakfast for 6 weeks and then 1/2 a pill once a day after breakfast for 6 weeks and then stop. 60 Tab 0   ??? amLODIPine-benazepril (LotreL) 5-20 mg per capsule Take 1 Cap by mouth daily. 90 Cap 3   ??? glipiZIDE SR (GLUCOTROL XL) 2.5 mg CR tablet TAKE 1 TABLET BY MOUTH DAILY 30 Tab 5   ??? cholecalciferol (Vitamin D3) (2,000 UNITS /50 MCG) cap capsule TAKE 1 CAPSULE BY MOUTH EVERY DAY 30 Cap 5   ??? pantoprazole  (PROTONIX) 40 mg tablet Take 1 Tab by mouth daily. 30 Tab 5   ??? pravastatin (PRAVACHOL) 80 mg tablet Take 1 Tab by mouth daily. 90 Tab 3   ??? famotidine (PEPCID) 20 mg tablet take 1 tablet by mouth once daily 90 Tab 3   ??? folic acid 400 mcg tablet Take 400 mcg by mouth daily.     ??? nitroglycerin (NITROSTAT) 0.4 mg SL tablet Place 1 sl under the tongue q 5 min prn cp, max 3 sl in a 15-min time period. Call 911 if no relief after the 3rd sl. 1 Bottle prn   ??? aspirin 81 mg chewable tablet Take 1 Tab by mouth daily. 30 Tab 11     Allergies   Allergen Reactions   ??? Ativan [Lorazepam] Other (comments)     Made her feel crazy and chest pressure   ??? Ativan [Lorazepam] Other (comments)     Chest fullness   ??? Other Medication Palpitations     Some type of decongestant   ??? Other Medication Other (comments)     Doesn't take decongestants, but can't recall why     Patient Active Problem List   Diagnosis Code   ??? HTN (hypertension) I10   ??? Diabetes mellitus type 2, controlled (HCC) E11.9   ??? Coronary artery disease involving native coronary artery of native heart without angina pectoris I25.10   ???  Dyslipidemia E78.5   ??? OSA on CPAP G47.33, Z99.89   ??? Severe obesity (HCC) E66.01   ??? Elevated sed rate R70.0   ??? Normocytic anemia D64.9   ??? Vitamin D deficiency E55.9     Past Medical History:   Diagnosis Date   ??? Abdominal pain 07/22/2015   ??? Anemia    ??? Back pain    ??? Bursitis of shoulder    ??? CAD (coronary artery disease)    ??? Cerumen impaction    ??? Chest pain, atypical 07/22/2015   ??? Diabetes (HCC)     no meds, A1c 09/28/17 6.6; avg bs 105; denies ss of hypo   ??? Diabetes mellitus out of control (HCC)    ??? Endocrine disease     thyroid   ??? Fatigue    ??? GERD (gastroesophageal reflux disease)     controlled with medicaitons   ??? Hip pain, right    ??? Hyperlipidemia, mild    ??? Hypertension     managed with medication   ??? Hypertension associated with diabetes (HCC) 07/22/2015   ??? 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  Left carotid bruit    ??? Lumbago 07/22/2015   ??? Morbid obesity (HCC) 07/22/2015    BMI 35.3   ??? OSA on CPAP 07/22/2015    uses CPAP   ??? Other ill-defined conditions(799.89)     cholesterol   ??? Other ill-defined conditions(799.89)     heart cath 96   ??? Shoulder pain, acute    ??? Shoulder pain, bilateral    ??? Sinusitis, acute maxillary    ??? Unstable angina (HCC) 07/06/2015   ??? Vitamin D deficiency    ??? Vitamin D deficiency 04/12/2019   ??? Weakness      Past Surgical History:   Procedure Laterality Date   ??? COLONOSCOPY N/A 03/01/2018    COLONOSCOPY/BMI 39 performed by Mazanec, Worthy Rancher, MD at [facility] ENDOSCOPY   ??? HX COLONOSCOPY     ??? HX HEART CATHETERIZATION  2016    without intervention   ??? HX HEENT      goiter/thyroid surgery   ??? HX THYROIDECTOMY       Family History   Problem Relation Age of Onset   ??? Stroke Sister    ??? Cancer Sister         cervical   ??? Stroke Brother    ??? Diabetes Other    ??? Hypertension Other      Social History     Tobacco Use   ??? Smoking status: Former Smoker     Packs/day: 0.50     Years: 10.00     Pack years: 5.00     Start date: 1961     Last attempt to quit: 1971     Years since quitting: 49.9   ??? Smokeless tobacco: Never Used   ??? Tobacco comment: quit in 1971   Substance Use Topics   ??? Alcohol use: No         Review of Systems  See above    OBJECTIVE:  Visit Vitals  BP (!) 160/70   Ht 4\' 11"  (1.499 m)   Wt 185 lb (83.9 kg)   BMI 37.37 kg/m??        Physical Exam  Constitutional:       Appearance: She is well-developed.   HENT:      Head: Normocephalic and atraumatic.   Eyes:      Pupils: Pupils  are equal, round, and reactive to light.   Neck:      Musculoskeletal: Normal range of motion and neck supple.      Thyroid: No thyromegaly.      Vascular: No JVD.   Cardiovascular:      Rate and Rhythm: Normal rate and regular rhythm.      Heart sounds: Normal heart sounds. No murmur. No friction rub. No gallop.    Pulmonary:      Effort: Pulmonary effort is normal. No respiratory distress.      Breath sounds:  No wheezing or rales.   Abdominal:      Palpations: Abdomen is soft.      Tenderness: There is no abdominal tenderness. There is no guarding or rebound.   Musculoskeletal: Normal range of motion.   Skin:     Findings: No rash.   Neurological:      Mental Status: She is alert and oriented to person, place, and time.         Medical problems and test results were reviewed with the patient today.         ASSESSMENT and PLAN    1.  Overactive bladder.  Significant improvement with the discontinuation of Zestoretic.    2.  Urinary frequency.  As above.    3.  Hypertension.  Although improved not at goal.  Patient has about 2 months worth of Lotrel left.  Rather than waste her current prescription I will continue her Lotrel at 5/20 and add 20 mg of lisinopril to equal a 5/40 dosing.  She is in agreement with this plan and verbalizes understanding its logic and value.  Reevaluate in 1 to 2 months.  Patient will monitor blood pressure outside the office.    4.  Dysphagia.  Per GI.  Continue Protonix.    5.  PMR.  Continue follow-up with rheumatology.  On prednisone.  Monitor blood sugar closely.     6.  Diabetes.  Last A1c was 6.5.  Monitor blood sugars closely with addition of prednisone.    Elements of this note have been dictated using speech recognition software. As a result, errors of speech recognition may have occurred.

## 2019-08-15 NOTE — Progress Notes (Signed)
SUBJECTIVE:   Sarah Chavez is a 78 y.o. female who has a past medical history significant for hypertension, diabetes, high cholesterol, polymyalgia rheumatica, overactive bladder.  At previous visit the patient had been noted to have consistent increases in her blood pressure and was complaining of ongoing struggles with her frequency of urination/overactive bladder.  As a result I discontinued her Zestoretic and changed her to Lotrel 5/20.  She reports a significant improvement in her overactive bladder symptoms.  She has been monitoring her blood pressure and states that although better it still mildly to moderately elevated ranging 160-170/90.  She is also been experiencing dysphagia and is being evaluated by GI.  Please refer to prior notes for details.  She has been placed on prednisone recently by her rheumatologist for her PMR.  She states that her joint pains and headaches are much better.  Patient denies chest pain, shortness of breath, orthopnea or PND.  GI and GU review of systems as above.    HPI  See above    Past Medical History, Past Surgical History, Family history, Social History, and Medications were all reviewed with the patient today and updated as necessary.       Current Outpatient Medications   Medication Sig Dispense Refill   ??? lisinopriL (PRINIVIL, ZESTRIL) 20 mg tablet Take 1 Tab by mouth daily. 90 Tab 3   ??? diclofenac EC (VOLTAREN) 75 mg EC tablet TAKE 1 TABLET BY MOUTH TWICE DAILY FOR 7 DAYS 14 Tab 0   ??? predniSONE (DELTASONE) 20 mg tablet Take 1 pill once a day after breakfast for 6 weeks and then 1/2 a pill once a day after breakfast for 6 weeks and then stop. 60 Tab 0   ??? amLODIPine-benazepril (LotreL) 5-20 mg per capsule Take 1 Cap by mouth daily. 90 Cap 3   ??? glipiZIDE SR (GLUCOTROL XL) 2.5 mg CR tablet TAKE 1 TABLET BY MOUTH DAILY 30 Tab 5   ??? cholecalciferol (Vitamin D3) (2,000 UNITS /50 MCG) cap capsule TAKE 1 CAPSULE BY MOUTH EVERY DAY 30 Cap 5    ??? pantoprazole (PROTONIX) 40 mg tablet Take 1 Tab by mouth daily. 30 Tab 5   ??? pravastatin (PRAVACHOL) 80 mg tablet Take 1 Tab by mouth daily. 90 Tab 3   ??? famotidine (PEPCID) 20 mg tablet take 1 tablet by mouth once daily 90 Tab 3   ??? folic acid 400 mcg tablet Take 400 mcg by mouth daily.     ??? nitroglycerin (NITROSTAT) 0.4 mg SL tablet Place 1 sl under the tongue q 5 min prn cp, max 3 sl in a 15-min time period. Call 911 if no relief after the 3rd sl. 1 Bottle prn   ??? aspirin 81 mg chewable tablet Take 1 Tab by mouth daily. 30 Tab 11     Allergies   Allergen Reactions   ??? Ativan [Lorazepam] Other (comments)     Made her feel crazy and chest pressure   ??? Ativan [Lorazepam] Other (comments)     Chest fullness   ??? Other Medication Palpitations     Some type of decongestant   ??? Other Medication Other (comments)     Doesn't take decongestants, but can't recall why     Patient Active Problem List   Diagnosis Code   ??? HTN (hypertension) I10   ??? Diabetes mellitus type 2, controlled (HCC) E11.9   ??? Coronary artery disease involving native coronary artery of native heart without angina pectoris I25.10   ???  Dyslipidemia E78.5   ??? OSA on CPAP G47.33, Z99.89   ??? Severe obesity (HCC) E66.01   ??? Elevated sed rate R70.0   ??? Normocytic anemia D64.9   ??? Vitamin D deficiency E55.9     Past Medical History:   Diagnosis Date   ??? Abdominal pain 07/22/2015   ??? Anemia    ??? Back pain    ??? Bursitis of shoulder    ??? CAD (coronary artery disease)    ??? Cerumen impaction    ??? Chest pain, atypical 07/22/2015   ??? Diabetes ([place])     no meds, A1c 09/28/17 6.6; avg bs 105; denies ss of hypo   ??? Diabetes mellitus out of control ([place])    ??? Endocrine disease     thyroid   ??? Fatigue    ??? GERD (gastroesophageal reflux disease)     controlled with medicaitons   ??? Hip pain, right    ??? Hyperlipidemia, mild    ??? Hypertension     managed with medication   ??? Hypertension associated with diabetes ([place]) 07/22/2015    ??? Hypertensive pulmonary vascular disease ([place]) 07/22/2015   ??? Left carotid bruit    ??? Lumbago 07/22/2015   ??? Morbid obesity ([place]) 07/22/2015    BMI 35.3   ??? OSA on CPAP 07/22/2015    uses CPAP   ??? Other ill-defined conditions(799.89)     cholesterol   ??? Other ill-defined conditions(799.89)     heart cath 96   ??? Shoulder pain, acute    ??? Shoulder pain, bilateral    ??? Sinusitis, acute maxillary    ??? Unstable angina ([place]) 07/06/2015   ??? Vitamin D deficiency    ??? Vitamin D deficiency 04/12/2019   ??? Weakness      Past Surgical History:   Procedure Laterality Date   ??? COLONOSCOPY N/A 03/01/2018    COLONOSCOPY/BMI 39 performed by Mazanec, Dorene Ar, MD at [facility] ENDOSCOPY   ??? HX COLONOSCOPY     ??? HX HEART CATHETERIZATION  2016    without intervention   ??? HX HEENT      goiter/thyroid surgery   ??? HX THYROIDECTOMY       Family History   Problem Relation Age of Onset   ??? Stroke Sister    ??? Cancer Sister         cervical   ??? Stroke Brother    ??? Diabetes Other    ??? Hypertension Other      Social History     Tobacco Use   ??? Smoking status: Former Smoker     Packs/day: 0.50     Years: 10.00     Pack years: 5.00     Start date: 1961     Last attempt to quit: 1971     Years since quitting: 49.9   ??? Smokeless tobacco: Never Used   ??? Tobacco comment: quit in 1971   Substance Use Topics   ??? Alcohol use: No         Review of Systems  See above    OBJECTIVE:  Visit Vitals  BP (!) 160/70   Ht 4\' 11"  (1.499 m)   Wt 185 lb (83.9 kg)   BMI 37.37 kg/m??        Physical Exam  Constitutional:       Appearance: She is well-developed.   HENT:      Head: Normocephalic and atraumatic.   Eyes:      Pupils: Pupils  are equal, round, and reactive to light.   Neck:      Musculoskeletal: Normal range of motion and neck supple.      Thyroid: No thyromegaly.      Vascular: No JVD.   Cardiovascular:      Rate and Rhythm: Normal rate and regular rhythm.      Heart sounds: Normal heart sounds. No murmur. No friction rub. No gallop.    Pulmonary:       Effort: Pulmonary effort is normal. No respiratory distress.      Breath sounds: No wheezing or rales.   Abdominal:      Palpations: Abdomen is soft.      Tenderness: There is no abdominal tenderness. There is no guarding or rebound.   Musculoskeletal: Normal range of motion.   Skin:     Findings: No rash.   Neurological:      Mental Status: She is alert and oriented to person, place, and time.         Medical problems and test results were reviewed with the patient today.         ASSESSMENT and PLAN    1.  Overactive bladder.  Significant improvement with the discontinuation of Zestoretic.    2.  Urinary frequency.  As above.    3.  Hypertension.  Although improved not at goal.  Patient has about 2 months worth of Lotrel left.  Rather than waste her current prescription I will continue her Lotrel at 5/20 and add 20 mg of lisinopril to equal a 5/40 dosing.  She is in agreement with this plan and verbalizes understanding its logic and value.  Reevaluate in 1 to 2 months.  Patient will monitor blood pressure outside the office.    4.  Dysphagia.  Per GI.  Continue Protonix.    5.  PMR.  Continue follow-up with rheumatology.  On prednisone.  Monitor blood sugar closely.     6.  Diabetes.  Last A1c was 6.5.  Monitor blood sugars closely with addition of prednisone.    Elements of this note have been dictated using speech recognition software. As a result, errors of speech recognition may have occurred.

## 2019-09-03 ENCOUNTER — Inpatient Hospital Stay
Admit: 2019-09-03 | Discharge: 2019-09-03 | Disposition: A | Discharge status: Home / Self Care | Payer: MEDICARE | Attending: Emergency Medicine

## 2019-09-03 DIAGNOSIS — B029 Zoster without complications: Secondary | ICD-10-CM

## 2019-09-03 MED ORDER — LIDOCAINE 4 % TOPICAL PATCH (12 HOUR DURATION)
4 % | MEDICATED_PATCH | CUTANEOUS | 1 refills | Status: AC
Start: 2019-09-03 — End: 2019-09-10

## 2019-09-03 MED ORDER — VALACYCLOVIR 1 G TAB
1 gram | ORAL_TABLET | Freq: Three times a day (TID) | ORAL | 0 refills | Status: AC
Start: 2019-09-03 — End: 2019-09-10

## 2019-09-03 NOTE — ED Provider Notes (Signed)
Here with right-sided chest pain now for several days.  She has shingles eruption to the same area of presenting complaint.  These are all vesicular at this point.  She has some very large blisters that mainly on the breast region and also some to the upper right arm and same dermatome posteriorly on the right.  She has had no fevers or chills.    The history is provided by the patient.   Rash   This is a new problem. The current episode started 2 days ago. There has been no fever. The rash is present on the chest. The pain is moderate. Associated symptoms include blisters. Pertinent negatives include no weeping. She has tried nothing for the symptoms.        Past Medical History:   Diagnosis Date   ??? Abdominal pain 07/22/2015   ??? Anemia    ??? Back pain    ??? Bursitis of shoulder    ??? CAD (coronary artery disease)    ??? Cerumen impaction    ??? Chest pain, atypical 07/22/2015   ??? Diabetes ([place])     no meds, A1c 09/28/17 6.6; avg bs 105; denies ss of hypo   ??? Diabetes mellitus out of control ([place])    ??? Endocrine disease     thyroid   ??? Fatigue    ??? GERD (gastroesophageal reflux disease)     controlled with medicaitons   ??? Hip pain, right    ??? Hyperlipidemia, mild    ??? Hypertension     managed with medication   ??? Hypertension associated with diabetes ([place]) 07/22/2015   ??? 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 Left carotid bruit    ??? Lumbago 07/22/2015   ??? Morbid obesity ([place]) 07/22/2015    BMI 35.3   ??? OSA on CPAP 07/22/2015    uses CPAP   ??? Other ill-defined conditions(799.89)     cholesterol   ??? Other ill-defined conditions(799.89)     heart cath 96   ??? Shoulder pain, acute    ??? Shoulder pain, bilateral    ??? Sinusitis, acute maxillary    ??? Unstable angina ([place]) 07/06/2015   ??? Vitamin D deficiency    ??? Vitamin D deficiency 04/12/2019   ??? Weakness        Past Surgical History:   Procedure Laterality Date   ??? COLONOSCOPY N/A 03/01/2018     COLONOSCOPY/BMI 39 performed by Mazanec, Dorene Ar, MD at [facility] ENDOSCOPY   ??? HX COLONOSCOPY     ??? HX HEART CATHETERIZATION  2016    without intervention   ??? HX HEENT      goiter/thyroid surgery   ??? HX THYROIDECTOMY           Family History:   Problem Relation Age of Onset   ??? Stroke Sister    ??? Cancer Sister         cervical   ??? Stroke Brother    ??? Diabetes Other    ??? Hypertension Other        Social History     Socioeconomic History   ??? Marital status: SINGLE     Spouse name: Not on file   ??? Number of children: Not on file   ??? Years of education: Not on file   ??? Highest education level: Not on file   Occupational History   ??? Not on file   Social Needs   ??? Financial resource strain: Not on file   ???  Food insecurity     Worry: Not on file     Inability: Not on file   ??? Transportation needs     Medical: Not on file     Non-medical: Not on file   Tobacco Use   ??? Smoking status: Former Smoker     Packs/day: 0.50     Years: 10.00     Pack years: 5.00     Start date: 541961     Quit date: 1971     Years since quitting: 50.0   ??? Smokeless tobacco: Never Used   ??? Tobacco comment: quit in 1971   Substance and Sexual Activity   ??? Alcohol use: No   ??? Drug use: No   ??? Sexual activity: Not on file   Lifestyle   ??? Physical activity     Days per week: Not on file     Minutes per session: Not on file   ??? Stress: Not on file   Relationships   ??? Social [employer]     Talks on phone: Not on file     Gets together: Not on file     Attends religious service: Not on file     Active member of club or organization: Not on file     Attends meetings of clubs or organizations: Not on file     Relationship status: Not on file   ??? Intimate partner violence     Fear of current or ex partner: Not on file     Emotionally abused: Not on file     Physically abused: Not on file     Forced sexual activity: Not on file   Other Topics Concern   ??? Not on file   Social History Narrative    ** Merged History Encounter **               ALLERGIES: Ativan [lorazepam], Ativan [lorazepam], Other medication, and Other medication    Review of Systems   Constitutional: Negative for chills and fever.   Respiratory: Negative.    Cardiovascular: Negative.    Skin: Positive for rash.   All other systems reviewed and are negative.      Vitals:    09/03/19 1000   BP: (!) 211/99   Pulse: 79   Resp: 16   Temp: 97.9 ??F (36.6 ??C)   SpO2: 99%   Weight: 83.9 kg (185 lb)   Height: 5\' 2"  (1.575 m)            Physical Exam  Vitals signs and nursing note reviewed.   Constitutional:       General: She is not in acute distress.     Appearance: Normal appearance. She is well-developed. She is obese. She is not toxic-appearing.   HENT:      Head: Atraumatic.      Right Ear: External ear normal.      Left Ear: External ear normal.   Eyes:      General: No scleral icterus.  Neck:      Musculoskeletal: Neck supple.   Cardiovascular:      Rate and Rhythm: Normal rate.   Pulmonary:      Effort: Pulmonary effort is normal. No respiratory distress.      Comments: Extensive shingle-like rash to the right chest and periscapular region.  Musculoskeletal: Normal range of motion.   Skin:     General: Skin is warm and dry.   Neurological:      General:  No focal deficit present.      Mental Status: She is alert.   Psychiatric:         Thought Content: Thought content normal.          MDM  Number of Diagnoses or Management Options  Herpes zoster without complication  Diagnosis management comments: Shingles.  Patient not immunocompromised.  Have discussed need to be cautious around people that might be already infants or people not immunized.  She has quite extensive blistering    Risk of Complications, Morbidity, and/or Mortality  Presenting problems: moderate  Diagnostic procedures: low  Management options: moderate  General comments: Have given patient information about area of her shingles eruption.  We will place her on the valacyclovir.    Patient Progress  Patient progress: stable          Procedures

## 2019-09-03 NOTE — ED Triage Notes (Signed)
Pt c/o rash with boils that started 12/24 on chest and spread to right axillary. Pt also with same rash/boils to right upper back. Pt denies any changes to soap or laundry detergent. Pt masked upon arrival to the ED.

## 2019-09-03 NOTE — ED Notes (Signed)
Pt c/o rash with boils that started 12/24 on chest and spread to right axillary. Pt also with same rash/boils to right upper back. Pt denies any changes to soap or laundry detergent. Pt masked upon arrival to the ED.

## 2019-09-03 NOTE — ED Provider Notes (Signed)
Here with right-sided chest pain now for several days.  She has shingles eruption to the same area of presenting complaint.  These are all vesicular at this point.  She has some very large blisters that mainly on the breast region and also some to the upper right arm and same dermatome posteriorly on the right.  She has had no fevers or chills.    The history is provided by the patient.   Rash   This is a new problem. The current episode started 2 days ago. There has been no fever. The rash is present on the chest. The pain is moderate. Associated symptoms include blisters. Pertinent negatives include no weeping. She has tried nothing for the symptoms.        Past Medical History:   Diagnosis Date   ??? Abdominal pain 07/22/2015   ??? Anemia    ??? Back pain    ??? Bursitis of shoulder    ??? CAD (coronary artery disease)    ??? Cerumen impaction    ??? Chest pain, atypical 07/22/2015   ??? Diabetes ([place])     no meds, A1c 09/28/17 6.6; avg bs 105; denies ss of hypo   ??? Diabetes mellitus out of control ([place])    ??? Endocrine disease     thyroid   ??? Fatigue    ??? GERD (gastroesophageal reflux disease)     controlled with medicaitons   ??? Hip pain, right    ??? Hyperlipidemia, mild    ??? Hypertension     managed with medication   ??? Hypertension associated with diabetes ([place]) 07/22/2015   ??? 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 Left carotid bruit    ??? Lumbago 07/22/2015   ??? Morbid obesity ([place]) 07/22/2015    BMI 35.3   ??? OSA on CPAP 07/22/2015    uses CPAP   ??? Other ill-defined conditions(799.89)     cholesterol   ??? Other ill-defined conditions(799.89)     heart cath 96   ??? Shoulder pain, acute    ??? Shoulder pain, bilateral    ??? Sinusitis, acute maxillary    ??? Unstable angina ([place]) 07/06/2015   ??? Vitamin D deficiency    ??? Vitamin D deficiency 04/12/2019   ??? Weakness        Past Surgical History:   Procedure Laterality Date   ??? COLONOSCOPY N/A 03/01/2018    COLONOSCOPY/BMI 39 performed by Mazanec, Dorene Ar, MD at [facility] ENDOSCOPY    ??? HX COLONOSCOPY     ??? HX HEART CATHETERIZATION  2016    without intervention   ??? HX HEENT      goiter/thyroid surgery   ??? HX THYROIDECTOMY           Family History:   Problem Relation Age of Onset   ??? Stroke Sister    ??? Cancer Sister         cervical   ??? Stroke Brother    ??? Diabetes Other    ??? Hypertension Other        Social History     Socioeconomic History   ??? Marital status: SINGLE     Spouse name: Not on file   ??? Number of children: Not on file   ??? Years of education: Not on file   ??? Highest education level: Not on file   Occupational History   ??? Not on file   Social Needs   ??? Financial resource strain: Not on file   ???  Food insecurity     Worry: Not on file     Inability: Not on file   ??? Transportation needs     Medical: Not on file     Non-medical: Not on file   Tobacco Use   ??? Smoking status: Former Smoker     Packs/day: 0.50     Years: 10.00     Pack years: 5.00     Start date: 7     Quit date: 1971     Years since quitting: 50.0   ??? Smokeless tobacco: Never Used   ??? Tobacco comment: quit in 1971   Substance and Sexual Activity   ??? Alcohol use: No   ??? Drug use: No   ??? Sexual activity: Not on file   Lifestyle   ??? Physical activity     Days per week: Not on file     Minutes per session: Not on file   ??? Stress: Not on file   Relationships   ??? Social Wellsite geologist on phone: Not on file     Gets together: Not on file     Attends religious service: Not on file     Active member of club or organization: Not on file     Attends meetings of clubs or organizations: Not on file     Relationship status: Not on file   ??? Intimate partner violence     Fear of current or ex partner: Not on file     Emotionally abused: Not on file     Physically abused: Not on file     Forced sexual activity: Not on file   Other Topics Concern   ??? Not on file   Social History Narrative    ** Merged History Encounter **              ALLERGIES: Ativan [lorazepam], Ativan [lorazepam], Other medication, and Other  medication    Review of Systems   Constitutional: Negative for chills and fever.   Respiratory: Negative.    Cardiovascular: Negative.    Skin: Positive for rash.   All other systems reviewed and are negative.      Vitals:    09/03/19 1000   BP: (!) 211/99   Pulse: 79   Resp: 16   Temp: 97.9 ??F (36.6 ??C)   SpO2: 99%   Weight: 83.9 kg (185 lb)   Height: 5\' 2"  (1.575 m)            Physical Exam  Vitals signs and nursing note reviewed.   Constitutional:       General: She is not in acute distress.     Appearance: Normal appearance. She is well-developed. She is obese. She is not toxic-appearing.   HENT:      Head: Atraumatic.      Right Ear: External ear normal.      Left Ear: External ear normal.   Eyes:      General: No scleral icterus.  Neck:      Musculoskeletal: Neck supple.   Cardiovascular:      Rate and Rhythm: Normal rate.   Pulmonary:      Effort: Pulmonary effort is normal. No respiratory distress.      Comments: Extensive shingle-like rash to the right chest and periscapular region.  Musculoskeletal: Normal range of motion.   Skin:     General: Skin is warm and dry.   Neurological:      General:  No focal deficit present.      Mental Status: She is alert.   Psychiatric:         Thought Content: Thought content normal.          MDM  Number of Diagnoses or Management Options  Herpes zoster without complication  Diagnosis management comments: Shingles.  Patient not immunocompromised.  Have discussed need to be cautious around people that might be already infants or people not immunized.  She has quite extensive blistering    Risk of Complications, Morbidity, and/or Mortality  Presenting problems: moderate  Diagnostic procedures: low  Management options: moderate  General comments: Have given patient information about area of her shingles eruption.  We will place her on the valacyclovir.    Patient Progress  Patient progress: stable         Procedures

## 2019-09-03 NOTE — ED Notes (Signed)
I have reviewed discharge instructions with the patient.  The patient verbalized understanding.    Patient left ED via Discharge Method: ambulatory to Home with female at bedside.    Opportunity for questions and clarification provided.       Patient given 2 scripts.         To continue your aftercare when you leave the hospital, you may receive an automated call from our care team to check in on how you are doing.  This is a free service and part of our promise to provide the best care and service to meet your aftercare needs." If you have questions, or wish to unsubscribe from this service please call [phone].  Thank you for Choosing our [facility] Emergency Department.

## 2019-09-03 NOTE — ED Notes (Signed)
I have reviewed discharge instructions with the patient.  The patient verbalized understanding.    Patient left ED via Discharge Method: ambulatory to Home with female at bedside.    Opportunity for questions and clarification provided.       Patient given 2 scripts.         To continue your aftercare when you leave the hospital, you may receive an automated call from our care team to check in on how you are doing.  This is a free service and part of our promise to provide the best care and service to meet your aftercare needs.??? If you have questions, or wish to unsubscribe from this service please call [phone].  Thank you for Choosing our [place] Emergency Department.

## 2019-09-11 MED ORDER — PANTOPRAZOLE 40 MG TAB, DELAYED RELEASE
40 mg | ORAL_TABLET | Freq: Every day | ORAL | 5 refills | Status: DC
Start: 2019-09-11 — End: 2019-10-18

## 2019-09-11 NOTE — Progress Notes (Signed)
Transitions of Care outreach in follow up to ER visit 09/03/2019  Patient diagnosed with Shingles - placed on Valacyclovir    Unable to reach   Left a Voice mail requesting a return call.

## 2019-09-11 NOTE — Telephone Encounter (Signed)
Please send acid reflux meds to [facility] at [place]

## 2019-09-11 NOTE — Progress Notes (Signed)
Transitions of Care outreach in follow up to ER visit 09/03/2019  Patient diagnosed with Shingles - placed on Valacyclovir    Unable to reach   Left a Voice mail requesting a return call.

## 2019-09-13 NOTE — Progress Notes (Signed)
Transitions of Care outreach in follow up to ER visit 12/28 - shingles was diagnosed  ER Physician documentation states she was prescribed valacyclovir and patient confirms that she has two pills remaining but this medication is not listed under medications tab.    Discussed need to take medication until they are all gone - patient has had trouble swallowing (large tablet).  Discussed need for PCP appointment to follow up - make sure shingles are resolving.  Patient is vague in stating that the shingles are continuing - or not - to be painful and symptomatic.  Per ER physician documentation, patient had "quite extensive blistering."    PLAN:  Route chart to PCP office so that an appointment can be offered  Follow up call to re-assess

## 2019-09-13 NOTE — Progress Notes (Signed)
Transitions of Care outreach in follow up to ER visit 12/28 - shingles was diagnosed  ER Physician documentation states she was prescribed valacyclovir and patient confirms that she has two pills remaining but this medication is not listed under medications tab.    Discussed need to take medication until they are all gone - patient has had trouble swallowing (large tablet).  Discussed need for PCP appointment to follow up - make sure shingles are resolving.  Patient is vague in stating that the shingles are continuing - or not - to be painful and symptomatic.  Per ER physician documentation, patient had "quite extensive blistering."    PLAN:  Route chart to PCP office so that an appointment can be offered  Follow up call to re-assess

## 2019-09-14 ENCOUNTER — Ambulatory Visit: Attending: Family Medicine | Primary: Family Medicine

## 2019-09-14 ENCOUNTER — Ambulatory Visit: Admit: 2019-09-14 | Discharge: 2019-09-14 | Payer: MEDICARE | Attending: Family Medicine | Primary: Family Medicine

## 2019-09-14 DIAGNOSIS — R21 Rash and other nonspecific skin eruption: Secondary | ICD-10-CM

## 2019-09-14 NOTE — Progress Notes (Signed)
SUBJECTIVE:   Sarah Chavez is a 79 y.o. female who has a past medical history significant for hypertension, diabetes, high cholesterol, polymyalgia rheumatica, overactive bladder.  Unfortunately since I last seen the patient she developed a burning painful rash on her right breast that radiated around her right side.  She went to the emergency room and was appropriately diagnosed with shingles.  She was given Valtrex and a lidocaine patch.  She reports that the rash is improving and the pain is diminishing but still present.  Her main concern today is that the rash between her breast is raw and rubbing due to friction.  Otherwise she is doing overall relatively well.  Adjustments in her blood pressure medicine were made at last visit and she is tolerating these well she states.  Please refer to prior notes for details.    HPI  See above    Past Medical History, Past Surgical History, Family history, Social History, and Medications were all reviewed with the patient today and updated as necessary.       Current Outpatient Medications   Medication Sig Dispense Refill   ??? pantoprazole (PROTONIX) 40 mg tablet Take 1 Tab by mouth daily. 30 Tab 5   ??? lisinopriL (PRINIVIL, ZESTRIL) 20 mg tablet Take 1 Tab by mouth daily. 90 Tab 3   ??? amLODIPine-benazepril (LotreL) 5-20 mg per capsule Take 1 Cap by mouth daily. 90 Cap 3   ??? glipiZIDE SR (GLUCOTROL XL) 2.5 mg CR tablet TAKE 1 TABLET BY MOUTH DAILY 30 Tab 5   ??? cholecalciferol (Vitamin D3) (2,000 UNITS /50 MCG) cap capsule TAKE 1 CAPSULE BY MOUTH EVERY DAY 30 Cap 5   ??? pravastatin (PRAVACHOL) 80 mg tablet Take 1 Tab by mouth daily. 90 Tab 3   ??? famotidine (PEPCID) 20 mg tablet take 1 tablet by mouth once daily 90 Tab 3   ??? folic acid 628 mcg tablet Take 400 mcg by mouth daily.     ??? nitroglycerin (NITROSTAT) 0.4 mg SL tablet Place 1 sl under the tongue q 5 min prn cp, max 3 sl in a 15-min time period. Call 911 if no relief after the 3rd sl. 1 Bottle prn    ??? aspirin 81 mg chewable tablet Take 1 Tab by mouth daily. 30 Tab 11     Allergies   Allergen Reactions   ??? Ativan [Lorazepam] Other (comments)     Made her feel crazy and chest pressure   ??? Ativan [Lorazepam] Other (comments)     Chest fullness   ??? Other Medication Palpitations     Some type of decongestant   ??? Other Medication Other (comments)     Doesn't take decongestants, but can't recall why     Patient Active Problem List   Diagnosis Code   ??? HTN (hypertension) I10   ??? Diabetes mellitus type 2, controlled ([place]) E11.9   ??? Coronary artery disease involving native coronary artery of native heart without angina pectoris I25.10   ??? Dyslipidemia E78.5   ??? OSA on CPAP G47.33, Z99.89   ??? Severe obesity ([facility]) E66.01   ??? Elevated sed rate R70.0   ??? Normocytic anemia D64.9   ??? Vitamin D deficiency E55.9     Past Medical History:   Diagnosis Date   ??? Abdominal pain 07/22/2015   ??? Anemia    ??? Back pain    ??? Bursitis of shoulder    ??? CAD (coronary artery disease)    ??? Cerumen  impaction    ??? Chest pain, atypical 07/22/2015   ??? Diabetes ([facility])     no meds, A1c 09/28/17 6.6; avg bs 105; denies ss of hypo   ??? Diabetes mellitus out of control ([facility])    ??? Endocrine disease     thyroid   ??? Fatigue    ??? GERD (gastroesophageal reflux disease)     controlled with medicaitons   ??? Hip pain, right    ??? Hyperlipidemia, mild    ??? Hypertension     managed with medication   ??? Hypertension associated with diabetes ([facility]) 07/22/2015   ??? 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 Left carotid bruit    ??? Lumbago 07/22/2015   ??? Morbid obesity ([facility]) 07/22/2015    BMI 35.3   ??? OSA on CPAP 07/22/2015    uses CPAP   ??? Other ill-defined conditions(799.89)     cholesterol   ??? Other ill-defined conditions(799.89)     heart cath 96   ??? Shoulder pain, acute    ??? Shoulder pain, bilateral    ??? Sinusitis, acute maxillary    ??? Unstable angina ([facility]) 07/06/2015   ??? Vitamin D deficiency    ??? Vitamin D deficiency 04/12/2019   ??? Weakness       Past Surgical History:   Procedure Laterality Date   ??? COLONOSCOPY N/A 03/01/2018    COLONOSCOPY/BMI 39 performed by Mazanec, Worthy Rancher, MD at [facility] ENDOSCOPY   ??? HX COLONOSCOPY     ??? HX HEART CATHETERIZATION  2016    without intervention   ??? HX HEENT      goiter/thyroid surgery   ??? HX THYROIDECTOMY       Family History   Problem Relation Age of Onset   ??? Stroke Sister    ??? Cancer Sister         cervical   ??? Stroke Brother    ??? Diabetes Other    ??? Hypertension Other      Social History     Tobacco Use   ??? Smoking status: Former Smoker     Packs/day: 0.50     Years: 10.00     Pack years: 5.00     Start date: 1961     Quit date: 1971     Years since quitting: 50.0   ??? Smokeless tobacco: Never Used   ??? Tobacco comment: quit in 1971   Substance Use Topics   ??? Alcohol use: No         Review of Systems  See above    OBJECTIVE:  Visit Vitals  BP 120/70   Ht 4\' 11"  (1.499 m)   BMI 37.37 kg/m??        Physical Exam  Constitutional:       Appearance: She is well-developed. She is obese.   HENT:      Head: Normocephalic and atraumatic.   Eyes:      Pupils: Pupils are equal, round, and reactive to light.   Neck:      Musculoskeletal: Normal range of motion and neck supple.      Thyroid: No thyromegaly.      Vascular: No JVD.   Cardiovascular:      Rate and Rhythm: Normal rate and regular rhythm.      Heart sounds: Normal heart sounds. No murmur. No friction rub. No gallop.    Pulmonary:      Effort: Pulmonary effort is normal. No respiratory distress.  Breath sounds: No wheezing or rales.   Abdominal:      Palpations: Abdomen is soft.      Tenderness: There is no abdominal tenderness. There is no guarding or rebound.   Musculoskeletal: Normal range of motion.   Skin:     Findings: Rash present.       Comments: Crusted vesicular rash in a dermatomal pattern on the right breast radiating to the right upper back is noted.  Examination particularly with the proximal border of this rash between her breast reveals some excoriation of the skin from what appears to be frictional rubbing between her breasts.  No signs of secondary infection.   Neurological:      Mental Status: She is alert and oriented to person, place, and time.         Medical problems and test results were reviewed with the patient today.         ASSESSMENT and PLAN    1.  Rash.  Underlying shingles with secondary frictional rubbing due to the location of the rash between her breasts.  Discussed the importance of protecting this area from continued friction.  Barrier methods discussed.  We will try copious amounts of Neosporin which will act as both lubricant and antibacterial and protection.  If symptoms worsen she is to let me know.    2.  Shingles.  Pain is much improved and patient reports no breakthrough issues.  Continue to monitor for postherpetic neuralgia.    3.  Hypertension.  Blood pressure 120/70.  Continue current therapy.    Elements of this note have been dictated using speech recognition software. As a result, errors of speech recognition may have occurred.

## 2019-09-14 NOTE — Progress Notes (Signed)
SUBJECTIVE:   Sarah Chavez is a 79 y.o. female who has a past medical history significant for hypertension, diabetes, high cholesterol, polymyalgia rheumatica, overactive bladder.  Unfortunately since I last seen the patient she developed a burning painful rash on her right breast that radiated around her right side.  She went to the emergency room and was appropriately diagnosed with shingles.  She was given Valtrex and a lidocaine patch.  She reports that the rash is improving and the pain is diminishing but still present.  Her main concern today is that the rash between her breast is raw and rubbing due to friction.  Otherwise she is doing overall relatively well.  Adjustments in her blood pressure medicine were made at last visit and she is tolerating these well she states.  Please refer to prior notes for details.    HPI  See above    Past Medical History, Past Surgical History, Family history, Social History, and Medications were all reviewed with the patient today and updated as necessary.       Current Outpatient Medications   Medication Sig Dispense Refill   ??? pantoprazole (PROTONIX) 40 mg tablet Take 1 Tab by mouth daily. 30 Tab 5   ??? lisinopriL (PRINIVIL, ZESTRIL) 20 mg tablet Take 1 Tab by mouth daily. 90 Tab 3   ??? amLODIPine-benazepril (LotreL) 5-20 mg per capsule Take 1 Cap by mouth daily. 90 Cap 3   ??? glipiZIDE SR (GLUCOTROL XL) 2.5 mg CR tablet TAKE 1 TABLET BY MOUTH DAILY 30 Tab 5   ??? cholecalciferol (Vitamin D3) (2,000 UNITS /50 MCG) cap capsule TAKE 1 CAPSULE BY MOUTH EVERY DAY 30 Cap 5   ??? pravastatin (PRAVACHOL) 80 mg tablet Take 1 Tab by mouth daily. 90 Tab 3   ??? famotidine (PEPCID) 20 mg tablet take 1 tablet by mouth once daily 90 Tab 3   ??? folic acid 400 mcg tablet Take 400 mcg by mouth daily.     ??? nitroglycerin (NITROSTAT) 0.4 mg SL tablet Place 1 sl under the tongue q 5 min prn cp, max 3 sl in a 15-min time period. Call 911 if no relief after the 3rd sl. 1 Bottle prn   ??? aspirin  81 mg chewable tablet Take 1 Tab by mouth daily. 30 Tab 11     Allergies   Allergen Reactions   ??? Ativan [Lorazepam] Other (comments)     Made her feel crazy and chest pressure   ??? Ativan [Lorazepam] Other (comments)     Chest fullness   ??? Other Medication Palpitations     Some type of decongestant   ??? Other Medication Other (comments)     Doesn't take decongestants, but can't recall why     Patient Active Problem List   Diagnosis Code   ??? HTN (hypertension) I10   ??? Diabetes mellitus type 2, controlled ([facility]) E11.9   ??? Coronary artery disease involving native coronary artery of native heart without angina pectoris I25.10   ??? Dyslipidemia E78.5   ??? OSA on CPAP G47.33, Z99.89   ??? Severe obesity ([facility]) E66.01   ??? Elevated sed rate R70.0   ??? Normocytic anemia D64.9   ??? Vitamin D deficiency E55.9     Past Medical History:   Diagnosis Date   ??? Abdominal pain 07/22/2015   ??? Anemia    ??? Back pain    ??? Bursitis of shoulder    ??? CAD (coronary artery disease)    ??? Cerumen  impaction    ??? Chest pain, atypical 07/22/2015   ??? Diabetes ([facility])     no meds, A1c 09/28/17 6.6; avg bs 105; denies ss of hypo   ??? Diabetes mellitus out of control ([facility])    ??? Endocrine disease     thyroid   ??? Fatigue    ??? GERD (gastroesophageal reflux disease)     controlled with medicaitons   ??? Hip pain, right    ??? Hyperlipidemia, mild    ??? Hypertension     managed with medication   ??? Hypertension associated with diabetes (H[facility]) 07/22/2015   ??? 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 Left carotid bruit    ??? Lumbago 07/22/2015   ??? Morbid obesity (HCC) 07/22/2015    BMI 35.3   ??? OSA on CPAP 07/22/2015    uses CPAP   ??? Other ill-defined conditions(799.89)     cholesterol   ??? Other ill-defined conditions(799.89)     heart cath 96   ??? Shoulder pain, acute    ??? Shoulder pain, bilateral    ??? Sinusitis, acute maxillary    ??? Unstable angina (HCC) 07/06/2015   ??? Vitamin D deficiency    ??? Vitamin D deficiency 04/12/2019   ??? Weakness      Past Surgical  History:   Procedure Laterality Date   ??? COLONOSCOPY N/A 03/01/2018    COLONOSCOPY/BMI 39 performed by Mazanec, Worthy Rancher, MD at [facility] ENDOSCOPY   ??? HX COLONOSCOPY     ??? HX HEART CATHETERIZATION  2016    without intervention   ??? HX HEENT      goiter/thyroid surgery   ??? HX THYROIDECTOMY       Family History   Problem Relation Age of Onset   ??? Stroke Sister    ??? Cancer Sister         cervical   ??? Stroke Brother    ??? Diabetes Other    ??? Hypertension Other      Social History     Tobacco Use   ??? Smoking status: Former Smoker     Packs/day: 0.50     Years: 10.00     Pack years: 5.00     Start date: 1961     Quit date: 1971     Years since quitting: 50.0   ??? Smokeless tobacco: Never Used   ??? Tobacco comment: quit in 1971   Substance Use Topics   ??? Alcohol use: No         Review of Systems  See above    OBJECTIVE:  Visit Vitals  BP 120/70   Ht 4\' 11"  (1.499 m)   BMI 37.37 kg/m??        Physical Exam  Constitutional:       Appearance: She is well-developed. She is obese.   HENT:      Head: Normocephalic and atraumatic.   Eyes:      Pupils: Pupils are equal, round, and reactive to light.   Neck:      Musculoskeletal: Normal range of motion and neck supple.      Thyroid: No thyromegaly.      Vascular: No JVD.   Cardiovascular:      Rate and Rhythm: Normal rate and regular rhythm.      Heart sounds: Normal heart sounds. No murmur. No friction rub. No gallop.    Pulmonary:      Effort: Pulmonary effort is normal. No respiratory distress.  Breath sounds: No wheezing or rales.   Abdominal:      Palpations: Abdomen is soft.      Tenderness: There is no abdominal tenderness. There is no guarding or rebound.   Musculoskeletal: Normal range of motion.   Skin:     Findings: Rash present.      Comments: Crusted vesicular rash in a dermatomal pattern on the right breast radiating to the right upper back is noted.  Examination particularly with the proximal border of this rash between her breast reveals some excoriation of the skin from  what appears to be frictional rubbing between her breasts.  No signs of secondary infection.   Neurological:      Mental Status: She is alert and oriented to person, place, and time.         Medical problems and test results were reviewed with the patient today.         ASSESSMENT and PLAN    1.  Rash.  Underlying shingles with secondary frictional rubbing due to the location of the rash between her breasts.  Discussed the importance of protecting this area from continued friction.  Barrier methods discussed.  We will try copious amounts of Neosporin which will act as both lubricant and antibacterial and protection.  If symptoms worsen she is to let me know.    2.  Shingles.  Pain is much improved and patient reports no breakthrough issues.  Continue to monitor for postherpetic neuralgia.    3.  Hypertension.  Blood pressure 120/70.  Continue current therapy.    Elements of this note have been dictated using speech recognition software. As a result, errors of speech recognition may have occurred.

## 2019-09-19 NOTE — Progress Notes (Signed)
Patient has seen PCP in follow up for shingles  Reminder set for phone call next week

## 2019-09-19 NOTE — Progress Notes (Signed)
Patient has seen PCP in follow up for shingles  Reminder set for phone call next week

## 2019-10-05 NOTE — Progress Notes (Signed)
 Transitions of Care outreach in follow up to issues with shingles, associated ongoing pain  Patient have particular trouble with lesions that are on her back and hard to reach, other areas are healing up pretty well.  It is of note however, that patient had to cut the call short because she was having some pain.    Patient is monitoring her BP - staying around 150 systolic.  Able to describe her medication(s) regimen without difficulty.    PLAN:  Route chart to PCP re: shingles  Follow up outreach in about 2 weeks.

## 2019-10-05 NOTE — Progress Notes (Signed)
Transitions of Care outreach in follow up to issues with shingles, associated ongoing pain  Patient have particular trouble with lesions that are on her back and hard to reach, other areas are "healing up pretty well."  It is of note however, that patient had to cut the call short because she was having some pain.    Patient is monitoring her BP - staying around 150 systolic.  Able to describe her medication(s) regimen without difficulty.    PLAN:  Route chart to PCP re: shingles  Follow up outreach in about 2 weeks.

## 2019-10-12 ENCOUNTER — Ambulatory Visit: Attending: Rheumatology | Primary: Family Medicine

## 2019-10-12 ENCOUNTER — Ambulatory Visit: Admit: 2019-10-12 | Discharge: 2019-10-12 | Payer: MEDICARE | Attending: Rheumatology | Primary: Family Medicine

## 2019-10-12 DIAGNOSIS — M353 Polymyalgia rheumatica: Secondary | ICD-10-CM

## 2019-10-12 MED ORDER — PREDNISONE 5 MG TAB
5 mg | ORAL_TABLET | Freq: Every day | ORAL | 1 refills | Status: DC
Start: 2019-10-12 — End: 2019-11-21

## 2019-10-12 NOTE — Progress Notes (Signed)
[facility]  Jeffie Pollock, M.D.  [address], [place] [address]  Office : [phone], Fax: [phone]     RHEUMATOLOGY OFFICE VISIT NOTE  Date of Visit:  10/12/2019 10:55 AM    Chavez Information:  Name:  Sarah Chavez  DOB:  1941-04-02  Age:  79 y.o.   Gender:  female      Sarah Chavez is here today for follow-up of PMR.      Last visit: 07/25/2019      History of Present Illness: On talking to Sarah Chavez today she states that she has been having a lot of lower back pain. On Christmas Eve she was seen at urgent care for Shingles and was treated with possible acyclovir for 10 days but since then has gone to urgent care on Saturday and was started on gabapentin for nerve pain around her breast from Sarah shingles she has had.  She denies any temporal headaches with no blurring of vision or double vision.  Her other current joint complaints are as mentioned below.    Since Sarah last visit, Chavez is feeling "fair to good".    Pain: 8/10  Location:  Some lower back pain. Some right shoulder and left para spinal muscle pain. No headaches. Some mid back and right shoulder blade pain.   Quality: Deep achy to sharp pain.   Modifying Factors:  Standing and walking worsens Sarah lower back pain.   Associated Symptoms:  AM Stiffness: 30 min; No tingling, numbness or pain down Sarah arms or legs. Some right UE and LE weakness. Needs help with buttoning and unbuttoning. Constant tingling and numbness of Sarah thumb and index fingers.   Fatigue: 5/10  MDHAQ: 1.6      Last TB screen:10+ years  TB result:negative  ??  Current dose of steroids:prednisone??10 mg  How long on current dose of steroids:????2-3 weeks  How long on continuous steroid therapy:about 2 years   ??  Past DMARDs, if applicable (methotrexate, plaquenil/hydroxychloroquine, sulfasalazine, Arava/leflunomide):none  ??   Past biologics,??if applicable??(enbrel, humira, simponi, cimzia, [place], [place], remicade, simponi aria, actemra, rituximab, Leonie Man, cosentyx):none  ??  Past NSAIDs, if applicable (motrin, aleve, naproxen, advil, ibuprofen, celebrex, voltaren/diclofenac, etc.)??ibuprofen.  ??  ??  Last BMD:??2013  Past osteoporosis drugs, if applicable (fosamax, actonel, boniva, reclast, prolia, forteo):none  ??  BMI:37.37  ??  Current exercise regimen, if GBT:DVVO  Current vitamin D dose:OTC VIT D??  Current calcium dose:none  Fractures since last visit, if any:??none      Sarah Chavez otherwise has no significant interval changes in health or medical history to report.     History Reviewed:    Past Medical History  Past Medical History:   Diagnosis Date   ??? Abdominal pain 07/22/2015   ??? Anemia    ??? Back pain    ??? Bursitis of shoulder    ??? CAD (coronary artery disease)    ??? Cerumen impaction    ??? Chest pain, atypical 07/22/2015   ??? Diabetes ([place])     no meds, A1c 09/28/17 6.6; avg bs 105; denies ss of hypo   ??? Diabetes mellitus out of control ([place])    ??? Endocrine disease     thyroid   ??? Fatigue    ??? GERD (gastroesophageal reflux disease)     controlled with medicaitons   ??? Hip pain, right    ??? Hyperlipidemia, mild    ??? Hypertension     managed with  medication   ??? Hypertension associated with diabetes ([place]) 07/22/2015   ??? 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 Left carotid bruit    ??? Lumbago 07/22/2015   ??? Morbid obesity ([place]) 07/22/2015    BMI 35.3   ??? OSA on CPAP 07/22/2015    uses CPAP   ??? Other ill-defined conditions(799.89)     cholesterol   ??? Other ill-defined conditions(799.89)     heart cath 96   ??? Shoulder pain, acute    ??? Shoulder pain, bilateral    ??? Sinusitis, acute maxillary    ??? Unstable angina ([place]) 07/06/2015   ??? Vitamin D deficiency    ??? Vitamin D deficiency 04/12/2019   ??? Weakness        Past Surgical History  Past Surgical History:   Procedure Laterality Date   ??? COLONOSCOPY N/A 03/01/2018     COLONOSCOPY/BMI 39 performed by Mazanec, Dorene Ar, MD at [facility] ENDOSCOPY   ??? HX COLONOSCOPY     ??? HX HEART CATHETERIZATION  2016    without intervention   ??? HX HEENT      goiter/thyroid surgery   ??? HX THYROIDECTOMY         Family History  Family History   Problem Relation Age of Onset   ??? Stroke Sister    ??? Cancer Sister         cervical   ??? Stroke Brother    ??? Diabetes Other    ??? Hypertension Other        Social History  Social History     Socioeconomic History   ??? Marital status: SINGLE     Spouse name: Not on file   ??? Number of children: Not on file   ??? Years of education: Not on file   ??? Highest education level: Not on file   Tobacco Use   ??? Smoking status: Former Smoker     Packs/day: 0.50     Years: 10.00     Pack years: 5.00     Start date: 24     Quit date: 1971     Years since quitting: 50.1   ??? Smokeless tobacco: Never Used   ??? Tobacco comment: quit in 1971   Substance and Sexual Activity   ??? Alcohol use: No   ??? Drug use: No   Social History Narrative    ** Merged History Encounter **                    Allergy:  Allergies   Allergen Reactions   ??? Ativan [Lorazepam] Other (comments)     Made her feel crazy and chest pressure   ??? Ativan [Lorazepam] Other (comments)     Chest fullness   ??? Other Medication Palpitations     Some type of decongestant   ??? Other Medication Other (comments)     Doesn't take decongestants, but can't recall why         Current Medications:  Outpatient Encounter Medications as of 10/12/2019   Medication Sig Dispense Refill   ??? gabapentin (Neurontin) 300 mg capsule Take 300 mg by mouth three (3) times daily.     ??? predniSONE (DELTASONE) 5 mg tablet Take 1 Tab by mouth daily (with breakfast). 30 Tab 1   ??? lisinopriL (PRINIVIL, ZESTRIL) 20 mg tablet Take 1 Tab by mouth daily. 90 Tab 3   ??? amLODIPine-benazepril (LotreL) 5-20 mg per capsule Take 1 Cap by mouth daily.  90 Cap 3   ??? glipiZIDE SR (GLUCOTROL XL) 2.5 mg CR tablet TAKE 1 TABLET BY MOUTH DAILY 30 Tab 5    ??? cholecalciferol (Vitamin D3) (2,000 UNITS /50 MCG) cap capsule TAKE 1 CAPSULE BY MOUTH EVERY DAY 30 Cap 5   ??? pravastatin (PRAVACHOL) 80 mg tablet Take 1 Tab by mouth daily. 90 Tab 3   ??? famotidine (PEPCID) 20 mg tablet take 1 tablet by mouth once daily 90 Tab 3   ??? nitroglycerin (NITROSTAT) 0.4 mg SL tablet Place 1 sl under Sarah tongue q 5 min prn cp, max 3 sl in a 15-min time period. Call 911 if no relief after Sarah 3rd sl. 1 Bottle prn   ??? [DISCONTINUED] predniSONE (DELTASONE) 10 mg tablet Take  by mouth daily (with breakfast).     ??? pantoprazole (PROTONIX) 40 mg tablet Take 1 Tab by mouth daily. 30 Tab 5   ??? folic acid 621 mcg tablet Take 400 mcg by mouth daily.     ??? aspirin 81 mg chewable tablet Take 1 Tab by mouth daily. 30 Tab 11     No facility-administered encounter medications on file as of 10/12/2019.            REVIEW OF SYSTEMS: Sarah following systems were reviewed with Chavez today and were negative except for Sarah following (depicted with an "X"):        "X" General  "X" Head and Neck  "X" Heart and Breathing  "X" Gastrointestinal    Fever/chills   Hair loss   Shortness of breath   Upset stomach    Falls   Dry mouth   Coughing  X Diarrhea / constipation    Wt loss   Mouth sores   Wheezing   Heartburn    Wt gain   Ringing ears   Chest pain   Dark or bloody stools    Night sweats   Diff. swallowing  X None of above   Nausea or vomiting   X None of above  X None of above      None of above                "X" Skin  "X" Neurology  "X" Urinary/Gyn  "X" Other    Easy bruising   Numbness/ tingling   Female problems   Depression    Rashes   Weakness   Problems with urination   Feeling anxious    Sun sensitivity  X Headaches  X None of above   Problems sleeping   X None of above  X None of above     X None of above          Physical Exam:  Blood pressure (!) 144/89, pulse 74, height '4\' 11"'  (1.499 m), weight 185 lb (83.9 kg).  General:  Chavez alert, cooperative and in no apparent distress.   HEENT: Pupils equally reactive to light and accommodation, no scleral injection noted.  No temporal tenderness on palpation with no pain in her jaws with opening and closing of her mouth.  Heart: Regular rate and rhythm, normal S1 and S2, no rubs or gallops.  Lungs: Clear to auscultation bilaterally.  Abdomen: Soft, nontender, no hepatosplenomegaly.  Skin:  No rashes. No nail abnormalities.  Neurologic:  Oriented, normal speech and affect.  Normal gait.    Extremities:  No edema in bilateral lower extremities with no cyanosis or clubbing.    Muskoskeletal Exam:     I examined  Sarah shoulders, elbows, wrists, MCPs, PIPs, DIPs and knees bilaterally for strength, range of motion, deformity, tenderness, swelling, and synovitis.      Sarah findings are: Does have tenderness on palpation of Sarah [facility] as well as Sarah IP joint of Sarah thumbs with no synovitis, warmth or redness.  Her ability to flex and extend Sarah fingers in both Sarah hands is intact.  Does have tenderness on palpation of Sarah C-spine as well as Sarah paraspinal muscles of Sarah neck.  With some T-spine as well as L-spine tenderness with bilateral SI joint tenderness.  Does have tenderness on palpation of Sarah knees in Sarah mid joint line with trace synovitis worse in Sarah right knee than Sarah left knee but no overlying warmth or redness.  Does have tenderness on palpation of Sarah right ankle laterally with trace synovitis but no overlying warmth or redness.  Does have trace synovitis involving Sarah left ankle laterally with no tenderness, warmth or redness.  Does have metatarsalgia involving Sarah right foot but not Sarah left foot.    Chavez otherwise has a normal joint exam without other evidence of joint tenderness, synovitis, warmth, erythema, decreased ROM, weakness or deformities.         Radiology Reports Reviewed (if available):  Last 3 months  No results found.    Lab Reports Reviewed (if available): Last 3 months     No visits with results within 3 Month(s) from this visit.   Latest known visit with results is:   Admission on 06/13/2019, Discharged on 06/13/2019   Component Date Value Ref Range Status   ??? Ventricular Rate 06/13/2019 76  BPM Final   ??? Atrial Rate 06/13/2019 76  BPM Final   ??? P-R Interval 06/13/2019 212  ms Final   ??? QRS Duration 06/13/2019 86  ms Final   ??? Q-T Interval 06/13/2019 396  ms Final   ??? QTC Calculation ([place]) 06/13/2019 445  ms Final   ??? Calculated P Axis 06/13/2019 26  degrees Final   ??? Calculated R Axis 06/13/2019 32  degrees Final   ??? Calculated T Axis 06/13/2019 86  degrees Final   ??? Diagnosis 06/13/2019    Final                    Value:Sinus rhythm with 1st degree A-V block with Premature atrial complexes  ST abnormality, possible digitalis effect  Abnormal ECG  When compared with ECG of 02-Apr-2018 13:49,  Premature atrial complexes are now Present  Confirmed by CEBE  MD ([facility]), JOHN E [phone]) on 06/13/2019 2:01:04 PM     ??? WBC 06/13/2019 9.2  4.3 - 11.1 K/uL Final   ??? RBC 06/13/2019 3.43* 4.05 - 5.2 M/uL Final   ??? HGB 06/13/2019 8.9* 11.7 - 15.4 g/dL Final   ??? HCT 06/13/2019 28.4* 35.8 - 46.3 % Final   ??? MCV 06/13/2019 82.8  79.6 - 97.8 FL Final   ??? MCH 06/13/2019 25.9* 26.1 - 32.9 PG Final   ??? MCHC 06/13/2019 31.3* 31.4 - 35.0 g/dL Final   ??? RDW 06/13/2019 16.0* 11.9 - 14.6 % Final   ??? PLATELET 06/13/2019 249  150 - 450 K/uL Final   ??? MPV 06/13/2019 10.2  9.4 - 12.3 FL Final   ??? ABSOLUTE NRBC 06/13/2019 0.00  0.0 - 0.2 K/uL Final    **Note: Absolute NRBC parameter is now reported with Hemogram**   ??? DF 06/13/2019 AUTOMATED    Final   ??? NEUTROPHILS 06/13/2019  69  43 - 78 % Final   ??? LYMPHOCYTES 06/13/2019 18  13 - 44 % Final   ??? MONOCYTES 06/13/2019 10  4.0 - 12.0 % Final   ??? EOSINOPHILS 06/13/2019 2  0.5 - 7.8 % Final   ??? BASOPHILS 06/13/2019 0  0.0 - 2.0 % Final   ??? IMMATURE GRANULOCYTES 06/13/2019 0  0.0 - 5.0 % Final   ??? ABS. NEUTROPHILS 06/13/2019 6.4  1.7 - 8.2 K/UL Final    ??? ABS. LYMPHOCYTES 06/13/2019 1.7  0.5 - 4.6 K/UL Final   ??? ABS. MONOCYTES 06/13/2019 1.0  0.1 - 1.3 K/UL Final   ??? ABS. EOSINOPHILS 06/13/2019 0.2  0.0 - 0.8 K/UL Final   ??? ABS. BASOPHILS 06/13/2019 0.0  0.0 - 0.2 K/UL Final   ??? ABS. IMM. GRANS. 06/13/2019 0.0  0.0 - 0.5 K/UL Final   ??? Sodium 06/13/2019 138  136 - 145 mmol/L Final   ??? Potassium 06/13/2019 4.4  3.5 - 5.1 mmol/L Final   ??? Chloride 06/13/2019 105  98 - 107 mmol/L Final   ??? CO2 06/13/2019 26  21 - 32 mmol/L Final   ??? Anion gap 06/13/2019 7  7 - 16 mmol/L Final   ??? Glucose 06/13/2019 107* 65 - 100 mg/dL Final    Comment: 47 - 60 mg/dl Consistent with, but not fully diagnostic of hypoglycemia.  101 - 125 mg/dl Impaired fasting glucose/consistent with pre-diabetes mellitus  > 126 mg/dl Fasting glucose consistent with overt diabetes mellitus     ??? BUN 06/13/2019 14  8 - 23 MG/DL Final   ??? Creatinine 06/13/2019 1.12* 0.6 - 1.0 MG/DL Final   ??? GFR est AA 06/13/2019 >60  >60 ml/min/1.36m Final   ??? GFR est non-AA 06/13/2019 50* >60 ml/min/1.728mFinal    Comment: (NOTE)  Estimated GFR is calculated using Sarah Modification of Diet in Renal   Disease (MDRD) Study equation, reported for both African Americans   (GFRAA) and non-African Americans (GFRNA), and normalized to 1.7352m body surface area. Sarah physician must decide which value applies to   Sarah Chavez. Sarah MDRD study equation should only be used in   individuals age 32 106 older. It has not been validated for Sarah   following: pregnant women, patients with serious comorbid conditions,   or on certain medications, or persons with extremes of body size,   muscle mass, or nutritional status.     ??? Calcium 06/13/2019 9.6  8.3 - 10.4 MG/DL Final   ??? Bilirubin, total 06/13/2019 0.5  0.2 - 1.1 MG/DL Final   ??? ALT (SGPT) 06/13/2019 14  12 - 65 U/L Final   ??? AST (SGOT) 06/13/2019 21  15 - 37 U/L Final   ??? Alk. phosphatase 06/13/2019 62  50 - 130 U/L Final   ??? Protein, total 06/13/2019 8.6* 6.3 - 8.2 g/dL Final    ??? Albumin 06/13/2019 2.9* 3.2 - 4.6 g/dL Final   ??? Globulin 06/13/2019 5.7* 2.3 - 3.5 g/dL Final   ??? A-G Ratio 06/13/2019 0.5* 1.2 - 3.5   Final   ??? Color 06/13/2019 YELLOW    Final   ??? Appearance 06/13/2019 CLEAR    Final   ??? Specific gravity 06/13/2019 1.008  1.001 - 1.023   Final   ??? pH (UA) 06/13/2019 6.0  5.0 - 9.0   Final   ??? Protein 06/13/2019 Negative  NEG mg/dL Final   ??? Glucose 06/13/2019 Negative  mg/dL Final   ??? Ketone  06/13/2019 Negative  NEG mg/dL Final   ??? Bilirubin 06/13/2019 Negative  NEG   Final   ??? Blood 06/13/2019 Negative  NEG   Final   ??? Urobilinogen 06/13/2019 0.2  0.2 - 1.0 EU/dL Final   ??? Nitrites 06/13/2019 Negative  NEG   Final   ??? Leukocyte Esterase 06/13/2019 Negative  NEG   Final         Sarah results above were reviewed and discussed with Chavez.         Assessment/Plan:   Dajon Rowe is a 79 y.o. female who presents with:     1. PMR (polymyalgia rheumatica) ([place]): In 10 days time I did instruct her to decrease Sarah prednisone dose down from 10 mg daily to 5 mg daily and remain on that dose until her follow-up visit with me.  If she starts having worsening shoulder girdle stiffness and pain with temporal headaches or jaw pain with double vision or blurring of vision she was instructed to call and keep me informed.  I will keep her informed with regard to her lab work that was ordered today.  -     predniSONE (DELTASONE) 5 mg tablet; Take 1 Tab by mouth daily (with breakfast).  -     C REACTIVE PROTEIN, QT  -     SED RATE (ESR)      Disease activity plan:  As stated above.    Steroid management plan:  As stated above, if applicable.    Pain management plan:  As stated above, if applicable.    Weight management plan:  Weight loss through diet and exercise is always encouraged    Disease prognosis: Good        I appreciate Sarah opportunity to continue to participate in Sarah care of this Chavez.     Follow-up and Dispositions    ?? Return in about 6 weeks (around 11/23/2019).        Electronically signed by:  Jeffie Pollock, MD      This note was dictated using dragon voice recognition software.  It has been proofread, but there may still exist voice recognition errors that Sarah author did not detect.                --------------------------------------------------------------------------------------------------------------------------------------------------------------------------------------------------------------------------------

## 2019-10-12 NOTE — Progress Notes (Signed)
[facility]  Jeffie Pollock, M.D.  [address], [place] [address]  Office : [phone], Fax: [phone]     RHEUMATOLOGY OFFICE VISIT NOTE  Date of Visit:  10/12/2019 10:55 AM    Patient Information:  Name:  Sarah Chavez  DOB:  1941-07-05  Age:  79 y.o.   Gender:  female      Sarah Chavez is here today for follow-up of PMR.      Last visit: 07/25/2019      History of Present Illness: On talking to the patient today she states that she has been having a lot of lower back pain. On Christmas Eve she was seen at urgent care for Shingles and was treated with possible acyclovir for 10 days but since then has gone to urgent care on Saturday and was started on gabapentin for nerve pain around her breast from the shingles she has had.  She denies any temporal headaches with no blurring of vision or double vision.  Her other current joint complaints are as mentioned below.    Since the last visit, patient is feeling "fair to good".    Pain: 8/10  Location:  Some lower back pain. Some right shoulder and left para spinal muscle pain. No headaches. Some mid back and right shoulder blade pain.   Quality: Deep achy to sharp pain.   Modifying Factors:  Standing and walking worsens the lower back pain.   Associated Symptoms:  AM Stiffness: 30 min; No tingling, numbness or pain down the arms or legs. Some right UE and LE weakness. Needs help with buttoning and unbuttoning. Constant tingling and numbness of the thumb and index fingers.   Fatigue: 5/10  MDHAQ: 1.6      Last TB screen:10+ years  TB result:negative  ??  Current dose of steroids:prednisone??10 mg  How long on current dose of steroids:????2-3 weeks  How long on continuous steroid therapy:about 2 years   ??  Past DMARDs, if applicable (methotrexate, plaquenil/hydroxychloroquine, sulfasalazine, Arava/leflunomide):none  ??  Past biologics,??if applicable??(enbrel, humira, simponi, cimzia, [place], [place], remicade, simponi aria, actemra,  rituximab, [place]ie [place], cosentyx):none  ??  Past NSAIDs, if applicable (motrin, aleve, naproxen, advil, ibuprofen, celebrex, voltaren/diclofenac, etc.)??ibuprofen.  ??  ??  Last BMD:??2013  Past osteoporosis drugs, if applicable (fosamax, actonel, boniva, reclast, prolia, forteo):none  ??  BMI:37.37  ??  Current exercise regimen, if ZHY:QMVH  Current vitamin D dose:OTC VIT D??  Current calcium dose:none  Fractures since last visit, if any:??none      The patient otherwise has no significant interval changes in health or medical history to report.     History Reviewed:    Past Medical History  Past Medical History:   Diagnosis Date   ??? Abdominal pain 07/22/2015   ??? Anemia    ??? Back pain    ??? Bursitis of shoulder    ??? CAD (coronary artery disease)    ??? Cerumen impaction    ??? Chest pain, atypical 07/22/2015   ??? Diabetes ([place])     no meds, A1c 09/28/17 6.6; avg bs 105; denies ss of hypo   ??? Diabetes mellitus out of control ([place])    ??? Endocrine disease     thyroid   ??? Fatigue    ??? GERD (gastroesophageal reflux disease)     controlled with medicaitons   ??? Hip pain, right    ??? Hyperlipidemia, mild    ??? Hypertension     managed with  medication   ??? Hypertension associated with diabetes ([place]) 07/22/2015   ??? 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 Left carotid bruit    ??? Lumbago 07/22/2015   ??? Morbid obesity ([place]) 07/22/2015    BMI 35.3   ??? OSA on CPAP 07/22/2015    uses CPAP   ??? Other ill-defined conditions(799.89)     cholesterol   ??? Other ill-defined conditions(799.89)     heart cath 96   ??? Shoulder pain, acute    ??? Shoulder pain, bilateral    ??? Sinusitis, acute maxillary    ??? Unstable angina ([place]) 07/06/2015   ??? Vitamin D deficiency    ??? Vitamin D deficiency 04/12/2019   ??? Weakness        Past Surgical History  Past Surgical History:   Procedure Laterality Date   ??? COLONOSCOPY N/A 03/01/2018    COLONOSCOPY/BMI 39 performed by Mazanec, Dorene Ar, MD at [facility] ENDOSCOPY   ??? HX COLONOSCOPY     ??? HX HEART CATHETERIZATION   2016    without intervention   ??? HX HEENT      goiter/thyroid surgery   ??? HX THYROIDECTOMY         Family History  Family History   Problem Relation Age of Onset   ??? Stroke Sister    ??? Cancer Sister         cervical   ??? Stroke Brother    ??? Diabetes Other    ??? Hypertension Other        Social History  Social History     Socioeconomic History   ??? Marital status: SINGLE     Spouse name: Not on file   ??? Number of children: Not on file   ??? Years of education: Not on file   ??? Highest education level: Not on file   Tobacco Use   ??? Smoking status: Former Smoker     Packs/day: 0.50     Years: 10.00     Pack years: 5.00     Start date: 55     Quit date: 1971     Years since quitting: 50.1   ??? Smokeless tobacco: Never Used   ??? Tobacco comment: quit in 1971   Substance and Sexual Activity   ??? Alcohol use: No   ??? Drug use: No   Social History Narrative    ** Merged History Encounter **                    Allergy:  Allergies   Allergen Reactions   ??? Ativan [Lorazepam] Other (comments)     Made her feel crazy and chest pressure   ??? Ativan [Lorazepam] Other (comments)     Chest fullness   ??? Other Medication Palpitations     Some type of decongestant   ??? Other Medication Other (comments)     Doesn't take decongestants, but can't recall why         Current Medications:  Outpatient Encounter Medications as of 10/12/2019   Medication Sig Dispense Refill   ??? gabapentin (Neurontin) 300 mg capsule Take 300 mg by mouth three (3) times daily.     ??? predniSONE (DELTASONE) 5 mg tablet Take 1 Tab by mouth daily (with breakfast). 30 Tab 1   ??? lisinopriL (PRINIVIL, ZESTRIL) 20 mg tablet Take 1 Tab by mouth daily. 90 Tab 3   ??? amLODIPine-benazepril (LotreL) 5-20 mg per capsule Take 1 Cap by mouth daily.  90 Cap 3   ??? glipiZIDE SR (GLUCOTROL XL) 2.5 mg CR tablet TAKE 1 TABLET BY MOUTH DAILY 30 Tab 5   ??? cholecalciferol (Vitamin D3) (2,000 UNITS /50 MCG) cap capsule TAKE 1 CAPSULE BY MOUTH EVERY DAY 30 Cap 5   ??? pravastatin (PRAVACHOL) 80 mg tablet  Take 1 Tab by mouth daily. 90 Tab 3   ??? famotidine (PEPCID) 20 mg tablet take 1 tablet by mouth once daily 90 Tab 3   ??? nitroglycerin (NITROSTAT) 0.4 mg SL tablet Place 1 sl under the tongue q 5 min prn cp, max 3 sl in a 15-min time period. Call 911 if no relief after the 3rd sl. 1 Bottle prn   ??? [DISCONTINUED] predniSONE (DELTASONE) 10 mg tablet Take  by mouth daily (with breakfast).     ??? pantoprazole (PROTONIX) 40 mg tablet Take 1 Tab by mouth daily. 30 Tab 5   ??? folic acid 229 mcg tablet Take 400 mcg by mouth daily.     ??? aspirin 81 mg chewable tablet Take 1 Tab by mouth daily. 30 Tab 11     No facility-administered encounter medications on file as of 10/12/2019.            REVIEW OF SYSTEMS: The following systems were reviewed with patient today and were negative except for the following (depicted with an "X"):        "X" General  "X" Head and Neck  "X" Heart and Breathing  "X" Gastrointestinal    Fever/chills   Hair loss   Shortness of breath   Upset stomach    Falls   Dry mouth   Coughing  X Diarrhea / constipation    Wt loss   Mouth sores   Wheezing   Heartburn    Wt gain   Ringing ears   Chest pain   Dark or bloody stools    Night sweats   Diff. swallowing  X None of above   Nausea or vomiting   X None of above  X None of above      None of above                "X" Skin  "X" Neurology  "X" Urinary/Gyn  "X" Other    Easy bruising   Numbness/ tingling   Female problems   Depression    Rashes   Weakness   Problems with urination   Feeling anxious    Sun sensitivity  X Headaches  X None of above   Problems sleeping   X None of above  X None of above     X None of above          Physical Exam:  Blood pressure (!) 144/89, pulse 74, height '4\' 11"'$  (1.499 m), weight 185 lb (83.9 kg).  General:  Patient alert, cooperative and in no apparent distress.  HEENT: Pupils equally reactive to light and accommodation, no scleral injection noted.  No temporal tenderness on palpation with no pain in her jaws with opening and  closing of her mouth.  Heart: Regular rate and rhythm, normal S1 and S2, no rubs or gallops.  Lungs: Clear to auscultation bilaterally.  Abdomen: Soft, nontender, no hepatosplenomegaly.  Skin:  No rashes. No nail abnormalities.  Neurologic:  Oriented, normal speech and affect.  Normal gait.    Extremities:  No edema in bilateral lower extremities with no cyanosis or clubbing.    Muskoskeletal Exam:     I examined  the shoulders, elbows, wrists, MCPs, PIPs, DIPs and knees bilaterally for strength, range of motion, deformity, tenderness, swelling, and synovitis.      The findings are: Does have tenderness on palpation of the [facility] as well as the IP joint of the thumbs with no synovitis, warmth or redness.  Her ability to flex and extend the fingers in both the hands is intact.  Does have tenderness on palpation of the C-spine as well as the paraspinal muscles of the neck.  With some T-spine as well as L-spine tenderness with bilateral SI joint tenderness.  Does have tenderness on palpation of the knees in the mid joint line with trace synovitis worse in the right knee than the left knee but no overlying warmth or redness.  Does have tenderness on palpation of the right ankle laterally with trace synovitis but no overlying warmth or redness.  Does have trace synovitis involving the left ankle laterally with no tenderness, warmth or redness.  Does have metatarsalgia involving the right foot but not the left foot.    Patient otherwise has a normal joint exam without other evidence of joint tenderness, synovitis, warmth, erythema, decreased ROM, weakness or deformities.         Radiology Reports Reviewed (if available):  Last 3 months  No results found.    Lab Reports Reviewed (if available): Last 3 months    No visits with results within 3 Month(s) from this visit.   Latest known visit with results is:   Admission on 06/13/2019, Discharged on 06/13/2019   Component Date Value Ref Range Status   ??? Ventricular Rate 06/13/2019  76  BPM Final   ??? Atrial Rate 06/13/2019 76  BPM Final   ??? P-R Interval 06/13/2019 212  ms Final   ??? QRS Duration 06/13/2019 86  ms Final   ??? Q-T Interval 06/13/2019 396  ms Final   ??? QTC Calculation ([place]) 06/13/2019 445  ms Final   ??? Calculated P Axis 06/13/2019 26  degrees Final   ??? Calculated R Axis 06/13/2019 32  degrees Final   ??? Calculated T Axis 06/13/2019 86  degrees Final   ??? Diagnosis 06/13/2019    Final                    Value:Sinus rhythm with 1st degree A-V block with Premature atrial complexes  ST abnormality, possible digitalis effect  Abnormal ECG  When compared with ECG of 02-Apr-2018 13:49,  Premature atrial complexes are now Present  Confirmed by CEBE  MD ([facility]), JOHN E [phone]) on 06/13/2019 2:01:04 PM     ??? WBC 06/13/2019 9.2  4.3 - 11.1 K/uL Final   ??? RBC 06/13/2019 3.43* 4.05 - 5.2 M/uL Final   ??? HGB 06/13/2019 8.9* 11.7 - 15.4 g/dL Final   ??? HCT 06/13/2019 28.4* 35.8 - 46.3 % Final   ??? MCV 06/13/2019 82.8  79.6 - 97.8 FL Final   ??? MCH 06/13/2019 25.9* 26.1 - 32.9 PG Final   ??? MCHC 06/13/2019 31.3* 31.4 - 35.0 g/dL Final   ??? RDW 06/13/2019 16.0* 11.9 - 14.6 % Final   ??? PLATELET 06/13/2019 249  150 - 450 K/uL Final   ??? MPV 06/13/2019 10.2  9.4 - 12.3 FL Final   ??? ABSOLUTE NRBC 06/13/2019 0.00  0.0 - 0.2 K/uL Final    **Note: Absolute NRBC parameter is now reported with Hemogram**   ??? DF 06/13/2019 AUTOMATED    Final   ??? NEUTROPHILS 06/13/2019  69  43 - 78 % Final   ??? LYMPHOCYTES 06/13/2019 18  13 - 44 % Final   ??? MONOCYTES 06/13/2019 10  4.0 - 12.0 % Final   ??? EOSINOPHILS 06/13/2019 2  0.5 - 7.8 % Final   ??? BASOPHILS 06/13/2019 0  0.0 - 2.0 % Final   ??? IMMATURE GRANULOCYTES 06/13/2019 0  0.0 - 5.0 % Final   ??? ABS. NEUTROPHILS 06/13/2019 6.4  1.7 - 8.2 K/UL Final   ??? ABS. LYMPHOCYTES 06/13/2019 1.7  0.5 - 4.6 K/UL Final   ??? ABS. MONOCYTES 06/13/2019 1.0  0.1 - 1.3 K/UL Final   ??? ABS. EOSINOPHILS 06/13/2019 0.2  0.0 - 0.8 K/UL Final   ??? ABS. BASOPHILS 06/13/2019 0.0  0.0 - 0.2 K/UL Final   ??? ABS.  IMM. GRANS. 06/13/2019 0.0  0.0 - 0.5 K/UL Final   ??? Sodium 06/13/2019 138  136 - 145 mmol/L Final   ??? Potassium 06/13/2019 4.4  3.5 - 5.1 mmol/L Final   ??? Chloride 06/13/2019 105  98 - 107 mmol/L Final   ??? CO2 06/13/2019 26  21 - 32 mmol/L Final   ??? Anion gap 06/13/2019 7  7 - 16 mmol/L Final   ??? Glucose 06/13/2019 107* 65 - 100 mg/dL Final    Comment: 47 - 60 mg/dl Consistent with, but not fully diagnostic of hypoglycemia.  101 - 125 mg/dl Impaired fasting glucose/consistent with pre-diabetes mellitus  > 126 mg/dl Fasting glucose consistent with overt diabetes mellitus     ??? BUN 06/13/2019 14  8 - 23 MG/DL Final   ??? Creatinine 06/13/2019 1.12* 0.6 - 1.0 MG/DL Final   ??? GFR est AA 06/13/2019 >60  >60 ml/min/1.54m Final   ??? GFR est non-AA 06/13/2019 50* >60 ml/min/1.737mFinal    Comment: (NOTE)  Estimated GFR is calculated using the Modification of Diet in Renal   Disease (MDRD) Study equation, reported for both African Americans   (GFRAA) and non-African Americans (GFRNA), and normalized to 1.7364m body surface area. The physician must decide which value applies to   the patient. The MDRD study equation should only be used in   individuals age 26 32 older. It has not been validated for the   following: pregnant women, patients with serious comorbid conditions,   or on certain medications, or persons with extremes of body size,   muscle mass, or nutritional status.     ??? Calcium 06/13/2019 9.6  8.3 - 10.4 MG/DL Final   ??? Bilirubin, total 06/13/2019 0.5  0.2 - 1.1 MG/DL Final   ??? ALT (SGPT) 06/13/2019 14  12 - 65 U/L Final   ??? AST (SGOT) 06/13/2019 21  15 - 37 U/L Final   ??? Alk. phosphatase 06/13/2019 62  50 - 130 U/L Final   ??? Protein, total 06/13/2019 8.6* 6.3 - 8.2 g/dL Final   ??? Albumin 06/13/2019 2.9* 3.2 - 4.6 g/dL Final   ??? Globulin 06/13/2019 5.7* 2.3 - 3.5 g/dL Final   ??? A-G Ratio 06/13/2019 0.5* 1.2 - 3.5   Final   ??? Color 06/13/2019 YELLOW    Final   ??? Appearance 06/13/2019 CLEAR    Final   ??? Specific  gravity 06/13/2019 1.008  1.001 - 1.023   Final   ??? pH (UA) 06/13/2019 6.0  5.0 - 9.0   Final   ??? Protein 06/13/2019 Negative  NEG mg/dL Final   ??? Glucose 06/13/2019 Negative  mg/dL Final   ??? Ketone  06/13/2019 Negative  NEG mg/dL Final   ??? Bilirubin 06/13/2019 Negative  NEG   Final   ??? Blood 06/13/2019 Negative  NEG   Final   ??? Urobilinogen 06/13/2019 0.2  0.2 - 1.0 EU/dL Final   ??? Nitrites 06/13/2019 Negative  NEG   Final   ??? Leukocyte Esterase 06/13/2019 Negative  NEG   Final         The results above were reviewed and discussed with patient.         Assessment/Plan:   Sarah Chavez is a 79 y.o. female who presents with:     1. PMR (polymyalgia rheumatica) ([place]): In 10 days time I did instruct her to decrease the prednisone dose down from 10 mg daily to 5 mg daily and remain on that dose until her follow-up visit with me.  If she starts having worsening shoulder girdle stiffness and pain with temporal headaches or jaw pain with double vision or blurring of vision she was instructed to call and keep me informed.  I will keep her informed with regard to her lab work that was ordered today.  -     predniSONE (DELTASONE) 5 mg tablet; Take 1 Tab by mouth daily (with breakfast).  -     C REACTIVE PROTEIN, QT  -     SED RATE (ESR)      Disease activity plan:  As stated above.    Steroid management plan:  As stated above, if applicable.    Pain management plan:  As stated above, if applicable.    Weight management plan:  Weight loss through diet and exercise is always encouraged    Disease prognosis: Good        I appreciate the opportunity to continue to participate in the care of this patient.     Follow-up and Dispositions    ?? Return in about 6 weeks (around 11/23/2019).       Electronically signed by:  Jeffie Pollock, MD      This note was dictated using dragon voice recognition software.  It has been proofread, but there may still exist voice recognition errors that the author did not  detect.                --------------------------------------------------------------------------------------------------------------------------------------------------------------------------------------------------------------------------------

## 2019-10-13 LAB — SED RATE (ESR): Sed rate (ESR): 54 mm/hr — ABNORMAL HIGH (ref 0–40)

## 2019-10-13 LAB — C REACTIVE PROTEIN, QT: C-Reactive Protein, Qt: 3 mg/L (ref 0–10)

## 2019-10-13 LAB — SEDIMENTATION RATE: Sed Rate: 54 mm/hr — ABNORMAL HIGH (ref 0–40)

## 2019-10-13 LAB — C-REACTIVE PROTEIN: CRP: 3 mg/L (ref 0–10)

## 2019-10-18 ENCOUNTER — Ambulatory Visit: Attending: Family Medicine | Primary: Family Medicine

## 2019-10-18 ENCOUNTER — Ambulatory Visit: Admit: 2019-10-18 | Discharge: 2019-10-18 | Payer: MEDICARE | Attending: Family Medicine | Primary: Family Medicine

## 2019-10-18 DIAGNOSIS — Z0000 Encounter for general adult medical examination without abnormal findings: Secondary | ICD-10-CM

## 2019-10-18 LAB — AMB POC URINE, MICROALBUMIN, SEMIQUANTITATIVE
Microalbumin urine (POC): 100 MG/L
Microalbumin urine, POC: 100 MG/L

## 2019-10-18 LAB — AMB POC COMPLETE CBC,AUTOMATED ENTER
ABS. GRANS (POC): 7.7 10*3/uL (ref 2.0–7.8)
ABS. LYMPHS (POC): 1.2 10*3/uL (ref 0.6–4.1)
GRANULOCYTES (POC): 82.4 % (ref 37.0–92.0)
Granulocytes %, POC: 82.4 % (ref 37.0–92.0)
Granulocytes Abs: 7.7 10*3/uL (ref 2.0–7.8)
HCT (POC): 37.1 % (ref 37.0–51.0)
HGB (POC): 11.8 g/dL — AB (ref 12.0–18.0)
Hematocrit, POC: 37.1 % (ref 37.0–51.0)
Hemoglobin, POC: 11.8 g/dL — AB (ref 12.0–18.0)
LYMPHOCYTES (POC): 13.2 % (ref 10.0–58.5)
Lymphocyte %: 13.2 % (ref 10.0–58.5)
Lymphs Abs: 1.2 10*3/uL (ref 0.6–4.1)
MCH (POC): 28.2 pg (ref 26.0–32.0)
MCH: 28.2 pg (ref 26.0–32.0)
MCHC (POC): 31.8 g/dL (ref 31.0–36.0)
MCHC: 31.8 g/dL (ref 31.0–36.0)
MCV (POC): 88.6 fL (ref 80.0–97.0)
MCV: 88.6 fL (ref 80.0–97.0)
MID% POC: 4.4 % (ref 0.1–24.0)
MPV (POC): 8 fL (ref 0.0–49.9)
MPV POC: 8 fL (ref 0.0–49.9)
Mid # (POC): 0.4 10*3/uL (ref 0.0–1.8)
Mid Cells %, POC: 4.4 % (ref 0.1–24.0)
Mid Cells Absoulute POC: 0.4 10*3/uL (ref 0.0–1.8)
PLATELET (POC): 266 10*3/uL (ref 140–440)
Platelet Count, POC: 266 10*3/uL (ref 140–440)
RBC (POC): 4.19 10*6/uL — AB (ref 4.20–6.30)
RBC, POC: 4.19 10*6/uL — AB (ref 4.20–6.30)
RDW (POC): 14.8 % — AB (ref 11.5–14.5)
RDW, POC: 14.8 % — AB (ref 11.5–14.5)
WBC (POC): 9.3 10*3/uL (ref 4.1–10.9)
WBC, POC: 9.3 10*3/uL (ref 4.1–10.9)

## 2019-10-18 MED ORDER — CHOLECALCIFEROL (VITAMIN D3) 2,000 UNIT CAPSULE
ORAL_CAPSULE | ORAL | 5 refills | Status: DC
Start: 2019-10-18 — End: 2019-12-12

## 2019-10-18 MED ORDER — LISINOPRIL 40 MG TAB
40 mg | ORAL_TABLET | Freq: Every day | ORAL | 3 refills | Status: DC
Start: 2019-10-18 — End: 2020-05-22

## 2019-10-18 MED ORDER — PANTOPRAZOLE 40 MG TAB, DELAYED RELEASE
40 mg | ORAL_TABLET | Freq: Every day | ORAL | 5 refills | Status: DC
Start: 2019-10-18 — End: 2020-04-22

## 2019-10-18 MED ORDER — PRAVASTATIN 80 MG TAB
80 mg | ORAL_TABLET | Freq: Every day | ORAL | 3 refills | Status: DC
Start: 2019-10-18 — End: 2019-12-31

## 2019-10-18 MED ORDER — AMLODIPINE 10 MG TAB
10 mg | ORAL_TABLET | Freq: Every day | ORAL | 3 refills | Status: DC
Start: 2019-10-18 — End: 2020-01-02

## 2019-10-18 NOTE — Patient Instructions (Signed)
Medicare Wellness Visit, Female     The best way to live healthy is to have a lifestyle where you eat a well-balanced diet, exercise regularly, limit alcohol use, and quit all forms of tobacco/nicotine, if applicable.     Regular preventive services are another way to keep healthy. Preventive services (vaccines, screening tests, monitoring & exams) can help personalize your care plan, which helps you manage your own care. Screening tests can find health problems at the earliest stages, when they are easiest to treat.   [facility] follows the current, evidence-based guidelines published by the [place] [employer] (USPSTF) when recommending preventive services for our patients. Because we follow these guidelines, sometimes recommendations change over time as research supports it. (For example, mammograms used to be recommended annually. Even though Medicare will still pay for an annual mammogram, the newer guidelines recommend a mammogram every two years for women of average risk).  Of course, you and your doctor may decide to screen more often for some diseases, based on your risk and your co-morbidities (chronic disease you are already diagnosed with).     Preventive services for you include:  - Medicare offers their members a free annual wellness visit, which is time for you and your primary care provider to discuss and plan for your preventive service needs. Take advantage of this benefit every year!  -All adults over the age of 96 should receive the recommended pneumonia vaccines. Current USPSTF guidelines recommend a series of two vaccines for the best pneumonia protection.   -All adults should have a flu vaccine yearly and a tetanus vaccine every 10 years.   -All adults age 63 and older should receive the shingles vaccines (series of two vaccines).      -All adults age 59-70 who are overweight should have a diabetes screening test once every three years.    -All adults born between [birth year] and 1967 should be screened once for Hepatitis C.  -Other screening tests and preventive services for persons with diabetes include: an eye exam to screen for diabetic retinopathy, a kidney function test, a foot exam, and stricter control over your cholesterol.   -Cardiovascular screening for adults with routine risk involves an electrocardiogram (ECG) at intervals determined by your doctor.   -Colorectal cancer screenings should be done for adults age 55-75 with no increased risk factors for colorectal cancer.  There are a number of acceptable methods of screening for this type of cancer. Each test has its own benefits and drawbacks. Discuss with your doctor what is most appropriate for you during your annual wellness visit. The different tests include: colonoscopy (considered the best screening method), a fecal occult blood test, a fecal DNA test, and sigmoidoscopy.    -A bone mass density test is recommended when a woman turns 65 to screen for osteoporosis. This test is only recommended one time, as a screening. Some providers will use this same test as a disease monitoring tool if you already have osteoporosis.  -Breast cancer screenings are recommended every other year for women of normal risk, age 97-74.  -Cervical cancer screenings for women over age 9 are only recommended with certain risk factors.     Here is a list of your current Health Maintenance items (your personalized list of preventive services) with a due date:  Health Maintenance Due   Topic Date Due   ??? Diabetic Foot Care  12/04/1950   ??? COVID-19 Vaccine (1 of 2) 12/05/1954   ???  DTaP/Tdap/Td  (1 - Tdap) 12/05/1959   ??? Shingles Vaccine (1 of 2) 12/04/1988   ??? Glaucoma Screening   04/09/2017   ??? Eye Exam  04/09/2017   ??? Yearly Flu Vaccine (1) 05/09/2019   ??? Albumin Urine Test  10/13/2019

## 2019-10-18 NOTE — Progress Notes (Signed)
SUBJECTIVE:   Sarah Chavez is a 79 y.o. female who has a past medical history significant for hypertension, diabetes, high cholesterol, polymyalgia rheumatica, overactive bladder.  At previous visit the patient was seen and follow-up from emergency room visit with diagnosis of shingles on her right breast that radiated around her right axilla to her right back.  At that time.  Was minimal pain but there was some skin irritation and rash related to the breast tissue ready for externally.  She was advised to use some barrier creams including Neosporin.  This helped significantly.  Unfortunately she did begin to develop some neuropathic discomfort which is progressed.  She is having burning and pain in her right breast and right axilla in the distribution where the shingles was.  Patient was seen at an urgent care center and was given Neurontin.  She states this is helping but not completely.  She is titrated up now to 300 mg 3 times a day.    Patient denies chest pain, shortness of breath, orthopnea or PND.  Current medicines are listed in the EMR and reviewed today.    HPI  See above    Past Medical History, Past Surgical History, Family history, Social History, and Medications were all reviewed with the patient today and updated as necessary.       Current Outpatient Medications   Medication Sig Dispense Refill   ??? pantoprazole (PROTONIX) 40 mg tablet Take 1 Tab by mouth daily. 30 Tab 5   ??? pravastatin (PRAVACHOL) 80 mg tablet Take 1 Tab by mouth daily. 90 Tab 3   ??? cholecalciferol (Vitamin D3) (2,000 UNITS /50 MCG) cap capsule TAKE 1 CAPSULE BY MOUTH EVERY DAY 30 Cap 5   ??? amLODIPine (Norvasc) 10 mg tablet Take 1 Tab by mouth daily. 90 Tab 3   ??? lisinopriL (PRINIVIL, ZESTRIL) 40 mg tablet Take 1 Tab by mouth daily. 90 Tab 3   ??? gabapentin (Neurontin) 300 mg capsule Take 300 mg by mouth three (3) times daily.     ??? predniSONE (DELTASONE) 5 mg tablet Take 1 Tab by mouth daily (with breakfast). 30 Tab 1    ??? glipiZIDE SR (GLUCOTROL XL) 2.5 mg CR tablet TAKE 1 TABLET BY MOUTH DAILY 30 Tab 5   ??? famotidine (PEPCID) 20 mg tablet take 1 tablet by mouth once daily 90 Tab 3   ??? folic acid 400 mcg tablet Take 400 mcg by mouth daily.     ??? nitroglycerin (NITROSTAT) 0.4 mg SL tablet Place 1 sl under the tongue q 5 min prn cp, max 3 sl in a 15-min time period. Call 911 if no relief after the 3rd sl. 1 Bottle prn   ??? aspirin 81 mg chewable tablet Take 1 Tab by mouth daily. 30 Tab 11     Allergies   Allergen Reactions   ??? Ativan [Lorazepam] Other (comments)     Made her feel crazy and chest pressure   ??? Ativan [Lorazepam] Other (comments)     Chest fullness   ??? Other Medication Palpitations     Some type of decongestant   ??? Other Medication Other (comments)     Doesn't take decongestants, but can't recall why     Patient Active Problem List   Diagnosis Code   ??? HTN (hypertension) I10   ??? Diabetes mellitus type 2, controlled (HCC) E11.9   ??? Coronary artery disease involving native coronary artery of native heart without angina pectoris I25.10   ???  Dyslipidemia E78.5   ??? OSA on CPAP G47.33, Z99.89   ??? Severe obesity (HCC) E66.01   ??? Elevated sed rate R70.0   ??? Normocytic anemia D64.9   ??? Vitamin D deficiency E55.9   ??? Polymyalgia rheumatica (HCC) M35.3     Past Medical History:   Diagnosis Date   ??? Abdominal pain 07/22/2015   ??? Anemia    ??? Back pain    ??? Bursitis of shoulder    ??? CAD (coronary artery disease)    ??? Cerumen impaction    ??? Chest pain, atypical 07/22/2015   ??? Diabetes ([place])     no meds, A1c 09/28/17 6.6; avg bs 105; denies ss of hypo   ??? Diabetes mellitus out of control ([place])    ??? Endocrine disease     thyroid   ??? Fatigue    ??? GERD (gastroesophageal reflux disease)     controlled with medicaitons   ??? Hip pain, right    ??? Hyperlipidemia, mild    ??? Hypertension     managed with medication   ??? Hypertension associated with diabetes ([place]) 07/22/2015   ??? 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 Left carotid bruit    ??? Lumbago 07/22/2015   ??? Morbid obesity ([place]) 07/22/2015    BMI 35.3   ??? OSA on CPAP 07/22/2015    uses CPAP   ??? Other ill-defined conditions(799.89)     cholesterol   ??? Other ill-defined conditions(799.89)     heart cath 96   ??? Shoulder pain, acute    ??? Shoulder pain, bilateral    ??? Sinusitis, acute maxillary    ??? Unstable angina ([place]) 07/06/2015   ??? Vitamin D deficiency    ??? Vitamin D deficiency 04/12/2019   ??? Weakness      Past Surgical History:   Procedure Laterality Date   ??? COLONOSCOPY N/A 03/01/2018    COLONOSCOPY/BMI 39 performed by Mazanec, Dorene Ar, MD at [facility] ENDOSCOPY   ??? HX COLONOSCOPY     ??? HX HEART CATHETERIZATION  2016    without intervention   ??? HX HEENT      goiter/thyroid surgery   ??? HX THYROIDECTOMY       Family History   Problem Relation Age of Onset   ??? Stroke Sister    ??? Cancer Sister         cervical   ??? Stroke Brother    ??? Diabetes Other    ??? Hypertension Other      Social History     Tobacco Use   ??? Smoking status: Former Smoker     Packs/day: 0.50     Years: 10.00     Pack years: 5.00     Start date: 1961     Quit date: 1971     Years since quitting: 50.1   ??? Smokeless tobacco: Never Used   ??? Tobacco comment: quit in 1971   Substance Use Topics   ??? Alcohol use: No         Review of Systems  See above    OBJECTIVE:  Visit Vitals  BP (!) 156/64 (BP 1 Location: Left upper arm)   Ht 4\' 11"  (1.499 m)   Wt 191 lb (86.6 kg)   BMI 38.58 kg/m??        Physical Exam  Constitutional:       Appearance: She is well-developed.   HENT:      Head: Normocephalic and  atraumatic.      Right Ear: External ear normal.      Left Ear: External ear normal.      Nose: Nose normal.      Mouth/Throat:      Pharynx: No oropharyngeal exudate.   Eyes:      General: No scleral icterus.        Right eye: No discharge.         Left eye: No discharge.      Pupils: Pupils are equal, round, and reactive to light.   Neck:      Musculoskeletal: Normal range of motion and neck supple.       Thyroid: No thyromegaly.      Vascular: No JVD.      Trachea: No tracheal deviation.   Cardiovascular:      Rate and Rhythm: Normal rate and regular rhythm.      Heart sounds: Normal heart sounds. No murmur. No friction rub. No gallop.    Pulmonary:      Effort: Pulmonary effort is normal. No respiratory distress.      Breath sounds: Normal breath sounds. No wheezing or rales.   Chest:      Chest wall: No tenderness.          Comments: Patient's breast exam reveals no palpable masses.  There is no adenopathy.  There is scarring from previous shingles noted in the above diagram.  Abdominal:      General: Bowel sounds are normal. There is no distension.      Palpations: Abdomen is soft. There is no mass.      Tenderness: There is no abdominal tenderness. There is no guarding or rebound.   Musculoskeletal: Normal range of motion.         General: No tenderness.   Lymphadenopathy:      Cervical: No cervical adenopathy.   Skin:     General: Skin is warm and dry.      Findings: Rash present. No erythema.      Comments: The vesicular rash that previously had been noted on her right breast radiating into her right axilla and right upper back as "dried up" and is now noted to be minimal scarring.  Patient's discomfort that she is describing and complaining of is in the distribution of this scarring.   Neurological:      Mental Status: She is alert and oriented to person, place, and time.      Cranial Nerves: No cranial nerve deficit.      Motor: No abnormal muscle tone.      Coordination: Coordination normal.      Deep Tendon Reflexes: Reflexes are normal and symmetric. Reflexes normal.   Psychiatric:         Behavior: Behavior normal.         Thought Content: Thought content normal.         Judgment: Judgment normal.         Medical problems and test results were reviewed with the patient today.         ASSESSMENT and PLAN     1.  Right breast pain.  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  Continue Neurontin.  Just recently increased dosing.  Seems to be improving.  May add some over-the-counter lidocaine patches as needed.    2.  Rash.  As above.    3.  Postherpetic neuralgia.  As above.  If symptoms progress or worsen she will let me know.  4.  Hypertension.  Blood pressure is 156/64.  Check metabolic panel, TSH and CBC.  We will increase her Norvasc from 5 mg to 10 mg.  I am changing her medications from combination of Lotrel and lisinopril to a higher dose of Norvasc 10 mg and lisinopril 40 mg.  Reevaluate blood pressure at follow-up.    5.  Diabetes.  Check hemoglobin A1c and urine microalbumin.  Encourage daily feet exams and yearly retinal exams.    6.  High cholesterol.  Check lipid panel.    7.  CAD.  No chest pain reported.    8.  Polymyalgia rheumatica.  Continue follow-up with rheumatology.  Currently on prednisone.     Elements of this note have been dictated using speech recognition software. As a result, errors of speech recognition may have occurred.

## 2019-10-18 NOTE — ACP (Advance Care Planning) (Signed)
Advanced care planning discussed with the patient including the importance of living will and power of attorney.  Patient reports both documents need to be created and will discuss with family members to get completed.  Patient understands the importance of these documents.

## 2019-10-18 NOTE — Progress Notes (Signed)
This is the Subsequent Medicare Annual Wellness Exam, performed 12 months or more after the Initial AWV or the last Subsequent AWV    I have reviewed the patient's medical history in detail and updated the computerized patient record.     Depression Risk Factor Screening:     3 most recent PHQ Screens 10/12/2019   Little interest or pleasure in doing things Several days   Feeling down, depressed, irritable, or hopeless Several days   Total Score PHQ 2 2   Trouble falling or staying asleep, or sleeping too much -   Feeling tired or having little energy -   Poor appetite, weight loss, or overeating -   Feeling bad about yourself - or that you are a failure or have let yourself or your family down -   Trouble concentrating on things such as school, work, reading, or watching TV -   Moving or speaking so slowly that other people could have noticed; or the opposite being so fidgety that others notice -   Thoughts of being better off dead, or hurting yourself in some way -   PHQ 9 Score -   How difficult have these problems made it for you to do your work, take care of your home and get along with others -       Alcohol Risk Screen    Do you average more than 1 drink per night or more than 7 drinks a week:  No    On any one occasion in the past three months have you have had more than 3 drinks containing alcohol:  No        Functional Ability and Level of Safety:    Hearing: Hearing is good.      Activities of Daily Living:  The home contains: no safety equipment.  Patient does total self care      Ambulation: with no difficulty     Fall Risk:  Fall Risk Assessment, last 12 mths 10/18/2019   Able to walk? Yes   Fall in past 12 months? 1   Do you feel unsteady? 1   Are you worried about falling 1   Is TUG test greater than 12 seconds? 1   Is the gait abnormal? 1   Number of falls in past 12 months 1   Fall with injury? 0      Abuse Screen:  Patient is not abused       Cognitive Screening     Has your family/caregiver stated any concerns about your memory: no     Cognitive Screening: Formal screening does not appear to be clinically indicated    Assessment/Plan   Education and counseling provided:  Are appropriate based on today's review and evaluation  End-of-Life planning (with patient's consent)    Diagnoses and all orders for this visit:    1. Medicare annual wellness visit, subsequent    Other orders  -     pantoprazole (PROTONIX) 40 mg tablet; Take 1 Tab by mouth daily.  -     pravastatin (PRAVACHOL) 80 mg tablet; Take 1 Tab by mouth daily.  -     cholecalciferol (Vitamin D3) (2,000 UNITS /50 MCG) cap capsule; TAKE 1 CAPSULE BY MOUTH EVERY DAY  -     amLODIPine-benazepril (LotreL) 5-20 mg per capsule; Take 1 Cap by mouth daily.      Patient is here for Medicare wellness exam.  Anticipatory guidance discussed including the importance of sunscreen use, helmet use  and seatbelt use.  No depressive symptoms are reported.  Risk for falls is minimal.  Cognitive functioning appears to be intact.  Pneumococcal vaccines including Prevnar 13 and Pneumovax 23 are up-to-date.  Advanced care planning discussed with the patient and documented in a separate note.    Health Maintenance Due     Health Maintenance Due   Topic Date Due   ??? Foot Exam Q1  [birth year]/04/1950   ??? COVID-19 Vaccine (1 of 2) 12/05/1954   ??? DTaP/Tdap/Td series (1 - Tdap) 12/05/1959   ??? Shingrix Vaccine Age 11> (1 of 2) 12/04/1988   ??? GLAUCOMA SCREENING Q2Y  04/09/2017   ??? Eye Exam Retinal or Dilated  04/09/2017   ??? Flu Vaccine (1) 05/09/2019   ??? MICROALBUMIN Q1  10/13/2019       Patient Care Team   Patient Care Team:  Delana Meyer, MD as PCP - General (Family Medicine)  Delana Meyer, MD as PCP - [facility] Empaneled Provider  Maree Krabbe, MD (Cardiology)    History     Patient Active Problem List   Diagnosis Code   ??? HTN (hypertension) I10   ??? Diabetes mellitus type 2, controlled (HCC) E11.9    ??? Coronary artery disease involving native coronary artery of native heart without angina pectoris I25.10   ??? Dyslipidemia E78.5   ??? OSA on CPAP G47.33, Z99.89   ??? Severe obesity (HCC) E66.01   ??? Elevated sed rate R70.0   ??? Normocytic anemia D64.9   ??? Vitamin D deficiency E55.9     Past Medical History:   Diagnosis Date   ??? Abdominal pain 07/22/2015   ??? Anemia    ??? Back pain    ??? Bursitis of shoulder    ??? CAD (coronary artery disease)    ??? Cerumen impaction    ??? Chest pain, atypical 07/22/2015   ??? Diabetes (HCC)     no meds, A1c 09/28/17 6.6; avg bs 105; denies ss of hypo   ??? Diabetes mellitus out of control (HCC)    ??? Endocrine disease     thyroid   ??? Fatigue    ??? GERD (gastroesophageal reflux disease)     controlled with medicaitons   ??? Hip pain, right    ??? Hyperlipidemia, mild    ??? Hypertension     managed with medication   ??? Hypertension associated with diabetes (HCC) 07/22/2015   ??? 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 Left carotid bruit    ??? Lumbago 07/22/2015   ??? Morbid obesity (HCC) 07/22/2015    BMI 35.3   ??? OSA on CPAP 07/22/2015    uses CPAP   ??? Other ill-defined conditions(799.89)     cholesterol   ??? Other ill-defined conditions(799.89)     heart cath 96   ??? Shoulder pain, acute    ??? Shoulder pain, bilateral    ??? Sinusitis, acute maxillary    ??? Unstable angina (HCC) 07/06/2015   ??? Vitamin D deficiency    ??? Vitamin D deficiency 04/12/2019   ??? Weakness       Past Surgical History:   Procedure Laterality Date   ??? COLONOSCOPY N/A 03/01/2018    COLONOSCOPY/BMI 39 performed by Mazanec, Worthy Rancher, MD at [facility] ENDOSCOPY   ??? HX COLONOSCOPY     ??? HX HEART CATHETERIZATION  2016    without intervention   ??? HX HEENT      goiter/thyroid surgery   ??? HX  THYROIDECTOMY       Current Outpatient Medications   Medication Sig Dispense Refill   ??? gabapentin (Neurontin) 300 mg capsule Take 300 mg by mouth three (3) times daily.      ??? predniSONE (DELTASONE) 5 mg tablet Take 1 Tab by mouth daily (with breakfast). 30 Tab 1   ??? pantoprazole (PROTONIX) 40 mg tablet Take 1 Tab by mouth daily. 30 Tab 5   ??? lisinopriL (PRINIVIL, ZESTRIL) 20 mg tablet Take 1 Tab by mouth daily. 90 Tab 3   ??? amLODIPine-benazepril (LotreL) 5-20 mg per capsule Take 1 Cap by mouth daily. 90 Cap 3   ??? glipiZIDE SR (GLUCOTROL XL) 2.5 mg CR tablet TAKE 1 TABLET BY MOUTH DAILY 30 Tab 5   ??? cholecalciferol (Vitamin D3) (2,000 UNITS /50 MCG) cap capsule TAKE 1 CAPSULE BY MOUTH EVERY DAY 30 Cap 5   ??? pravastatin (PRAVACHOL) 80 mg tablet Take 1 Tab by mouth daily. 90 Tab 3   ??? famotidine (PEPCID) 20 mg tablet take 1 tablet by mouth once daily 90 Tab 3   ??? folic acid 332 mcg tablet Take 400 mcg by mouth daily.     ??? nitroglycerin (NITROSTAT) 0.4 mg SL tablet Place 1 sl under the tongue q 5 min prn cp, max 3 sl in a 15-min time period. Call 911 if no relief after the 3rd sl. 1 Bottle prn   ??? aspirin 81 mg chewable tablet Take 1 Tab by mouth daily. 30 Tab 11     Allergies   Allergen Reactions   ??? Ativan [Lorazepam] Other (comments)     Made her feel crazy and chest pressure   ??? Ativan [Lorazepam] Other (comments)     Chest fullness   ??? Other Medication Palpitations     Some type of decongestant   ??? Other Medication Other (comments)     Doesn't take decongestants, but can't recall why       Family History   Problem Relation Age of Onset   ??? Stroke Sister    ??? Cancer Sister         cervical   ??? Stroke Brother    ??? Diabetes Other    ??? Hypertension Other      Social History     Tobacco Use   ??? Smoking status: Former Smoker     Packs/day: 0.50     Years: 10.00     Pack years: 5.00     Start date: 1961     Quit date: 1971     Years since quitting: 50.1   ??? Smokeless tobacco: Never Used   ??? Tobacco comment: quit in 1971   Substance Use Topics   ??? Alcohol use: No

## 2019-10-18 NOTE — Progress Notes (Signed)
Progress Notes by Delana Meyer, MD at 10/18/19 1110                Author: Delana Meyer, MD  Service: --  Author Type: Physician       Filed: 10/18/19 1324  Encounter Date: 10/18/2019  Status: Signed          Editor: Delana Meyer, MD (Physician)               SUBJECTIVE:    Sarah Chavez is a 79 y.o.  female who has a past medical history significant for hypertension, diabetes,  high cholesterol, polymyalgia rheumatica, overactive bladder.  At previous visit the patient was seen and follow-up from emergency room visit with diagnosis of shingles on her right breast that radiated  around her right axilla to her right back.  At that time.  Was minimal pain but there was some skin irritation and rash related to the breast tissue ready for externally.  She was advised to use some barrier creams including Neosporin.  This helped significantly.   Unfortunately she did begin to develop some neuropathic discomfort which is progressed.  She is having burning and pain in her right breast and right axilla in the distribution where the shingles was.  Patient was seen at an urgent care center and was  given Neurontin.  She states this is helping but not completely.  She is titrated up now to 300 mg 3 times a day.      Patient denies chest pain, shortness of breath, orthopnea or PND.  Current medicines are listed in the EMR and reviewed today.      HPI   See above      Past Medical History, Past Surgical History, Family history, Social History, and Medications were all reviewed with the patient today and updated as necessary.            Current Outpatient Medications          Medication  Sig  Dispense  Refill           ?  pantoprazole (PROTONIX) 40 mg tablet  Take 1 Tab by mouth daily.  30 Tab  5     ?  pravastatin (PRAVACHOL) 80 mg tablet  Take 1 Tab by mouth daily.  90 Tab  3     ?  cholecalciferol (Vitamin D3) (2,000 UNITS /50 MCG) cap capsule  TAKE 1 CAPSULE BY MOUTH EVERY DAY  30 Cap  5     ?   amLODIPine (Norvasc) 10 mg tablet  Take 1 Tab by mouth daily.  90 Tab  3     ?  lisinopriL (PRINIVIL, ZESTRIL) 40 mg tablet  Take 1 Tab by mouth daily.  90 Tab  3     ?  gabapentin (Neurontin) 300 mg capsule  Take 300 mg by mouth three (3) times daily.         ?  predniSONE (DELTASONE) 5 mg tablet  Take 1 Tab by mouth daily (with breakfast).  30 Tab  1     ?  glipiZIDE SR (GLUCOTROL XL) 2.5 mg CR tablet  TAKE 1 TABLET BY MOUTH DAILY  30 Tab  5     ?  famotidine (PEPCID) 20 mg tablet  take 1 tablet by mouth once daily  90 Tab  3     ?  folic acid 400 mcg tablet  Take 400 mcg by mouth daily.         ?  nitroglycerin (NITROSTAT) 0.4 mg SL tablet  Place 1 sl under the tongue q 5 min prn cp, max 3 sl in a 15-min time period. Call 911 if no relief after the 3rd sl.  1 Bottle  prn           ?  aspirin 81 mg chewable tablet  Take 1 Tab by mouth daily.  30 Tab  11          Allergies        Allergen  Reactions         ?  Ativan [Lorazepam]  Other (comments)             Made her feel crazy and chest pressure         ?  Ativan [Lorazepam]  Other (comments)             Chest fullness         ?  Other Medication  Palpitations             Some type of decongestant         ?  Other Medication  Other (comments)             Doesn't take decongestants, but can't recall why          Patient Active Problem List        Diagnosis  Code         ?  HTN (hypertension)  I10     ?  Diabetes mellitus type 2, controlled (HCC)  E11.9     ?  Coronary artery disease involving native coronary artery of native heart without angina pectoris  I25.10     ?  Dyslipidemia  E78.5     ?  OSA on CPAP  G47.33, Z99.89     ?  Severe obesity (HCC)  E66.01     ?  Elevated sed rate  R70.0     ?  Normocytic anemia  D64.9     ?  Vitamin D deficiency  E55.9         ?  Polymyalgia rheumatica (HCC)  M35.3          Past Medical History:        Diagnosis  Date         ?  Abdominal pain  07/22/2015     ?  Anemia       ?  Back pain       ?  Bursitis of shoulder       ?   CAD (coronary artery disease)       ?  Cerumen impaction       ?  Chest pain, atypical  07/22/2015     ?  Diabetes (HCC)            no meds, A1c 09/28/17 6.6; avg bs 105; denies ss of hypo         ?  Diabetes mellitus out of control [facility])       ?  Endocrine disease            thyroid         ?  Fatigue       ?  GERD (gastroesophageal reflux disease)            controlled with medicaitons         ?  Hip pain, right       ?  Hyperlipidemia, mild       ?  Hypertension            managed with medication         ?  Hypertension associated with diabetes ([place])  07/22/2015     ?  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  Left carotid bruit       ?  Lumbago  07/22/2015     ?  Morbid obesity ([place])  07/22/2015          BMI 35.3         ?  OSA on CPAP  07/22/2015          uses CPAP         ?  Other ill-defined conditions(799.89)            cholesterol         ?  Other ill-defined conditions(799.89)            heart cath 96         ?  Shoulder pain, acute       ?  Shoulder pain, bilateral       ?  Sinusitis, acute maxillary       ?  Unstable angina ([place])  07/06/2015     ?  Vitamin D deficiency       ?  Vitamin D deficiency  04/12/2019         ?  Weakness            Past Surgical History:         Procedure  Laterality  Date          ?  COLONOSCOPY  N/A  03/01/2018          COLONOSCOPY/BMI 39 performed by Mazanec, Dorene Ar, MD at [facility] ENDOSCOPY          ?  HX COLONOSCOPY         ?  HX HEART CATHETERIZATION    2016          without intervention          ?  HX HEENT              goiter/thyroid surgery          ?  HX THYROIDECTOMY              Family History         Problem  Relation  Age of Onset          ?  Stroke  Sister       ?  Cancer  Sister                cervical          ?  Stroke  Brother       ?  Diabetes  Other            ?  Hypertension  Other            Social History          Tobacco Use         ?  Smoking status:  Former Smoker              Packs/day:  0.50         Years:  10.00         Pack years:   5.00         Start date:  1961  Quit date:  46         Years since quitting:  50.1         ?  Smokeless tobacco:  Never Used        ?  Tobacco comment: quit in 1971       Substance Use Topics         ?  Alcohol use:  No              Review of Systems   See above      OBJECTIVE:   Visit Vitals      BP  (!) 156/64 (BP 1 Location: Left upper arm)     Ht  4\' 11"  (1.499 m)     Wt  191 lb (86.6 kg)        BMI  38.58 kg/m??            Physical Exam   Constitutional :        Appearance: She is well-developed.   HENT :       Head: Normocephalic and atraumatic.      Right Ear: External ear normal.      Left Ear: External ear normal.      Nose: Nose normal.      Mouth/Throat:      Pharynx: No oropharyngeal exudate.   Eyes:       General: No scleral icterus.        Right eye: No discharge.         Left eye: No discharge.      Pupils: Pupils are equal, round, and reactive to light.   Neck:       Musculoskeletal: Normal range of motion and neck supple.      Thyroid: No thyromegaly.      Vascular: No JVD.      Trachea: No tracheal deviation.   Cardiovascular:       Rate and Rhythm: Normal rate and regular rhythm.      Heart sounds: Normal heart sounds. No murmur. No friction rub. No gallop.     Pulmonary:       Effort: Pulmonary effort is normal. No respiratory distress.      Breath sounds: Normal breath sounds. No wheezing or rales.   Chest:       Chest wall: No tenderness.            Comments: Patient's breast exam reveals no palpable masses.  There is no adenopathy.  There is scarring from previous shingles  noted in the above diagram.   Abdominal:      General: Bowel sounds are normal. There is no distension.      Palpations: Abdomen is soft. There is no mass.      Tenderness: There is no abdominal tenderness. There  is no guarding or rebound.     Musculoskeletal: Normal range of motion.          General: No tenderness.     Lymphadenopathy:       Cervical: No cervical adenopathy.   Skin :      General: Skin is warm and  dry.      Findings: Rash present. No erythema.      Comments:  The vesicular rash that previously had been noted on her right breast radiating into her right axilla and right upper back as "dried up" and is now noted to be minimal scarring.  Patient's discomfort that she is describing and complaining of is in  the  distribution of this scarring.    Neurological:       Mental Status: She is alert and oriented to person, place, and time.      Cranial Nerves: No cranial nerve deficit.      Motor: No abnormal muscle tone.      Coordination: Coordination normal.      Deep Tendon Reflexes: Reflexes are  normal and symmetric. Reflexes normal.   Psychiatric :         Behavior: Behavior normal.         Thought Content: Thought content normal.         Judgment: Judgment normal.             Medical problems and test results were reviewed with the patient today.             ASSESSMENT and PLAN      1.  Right breast pain.  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  Continue Neurontin.  Just recently increased dosing.  Seems to be improving.  May add some over-the-counter lidocaine patches as needed.      2.  Rash.  As above.      3.  Postherpetic neuralgia.  As above.  If symptoms progress or worsen she will let me know.      4.  Hypertension.  Blood pressure is 156/64.  Check metabolic panel, TSH and CBC.  We will increase her Norvasc from 5 mg to 10 mg.  I am changing her medications from combination of Lotrel and lisinopril to a higher dose of Norvasc 10 mg and lisinopril  40 mg.  Reevaluate blood pressure at follow-up.      5.  Diabetes.  Check hemoglobin A1c and urine microalbumin.  Encourage daily feet exams and yearly retinal exams.      6.  High cholesterol.  Check lipid panel.      7.  CAD.  No chest pain reported.      8.  Polymyalgia rheumatica.  Continue follow-up with rheumatology.  Currently on prednisone.       Elements of this note have been dictated using speech recognition software. As a result,  errors of speech recognition may have occurred.

## 2019-10-18 NOTE — Progress Notes (Signed)
This is the Subsequent Medicare Annual Wellness Exam, performed 12 months or more after the Initial AWV or the last Subsequent AWV    I have reviewed the patient's medical history in detail and updated the computerized patient record.     Depression Risk Factor Screening:     3 most recent PHQ Screens 10/12/2019   Little interest or pleasure in doing things Several days   Feeling down, depressed, irritable, or hopeless Several days   Total Score PHQ 2 2   Trouble falling or staying asleep, or sleeping too much -   Feeling tired or having little energy -   Poor appetite, weight loss, or overeating -   Feeling bad about yourself - or that you are a failure or have let yourself or your family down -   Trouble concentrating on things such as school, work, reading, or watching TV -   Moving or speaking so slowly that other people could have noticed; or the opposite being so fidgety that others notice -   Thoughts of being better off dead, or hurting yourself in some way -   PHQ 9 Score -   How difficult have these problems made it for you to do your work, take care of your home and get along with others -       Alcohol Risk Screen    Do you average more than 1 drink per night or more than 7 drinks a week:  No    On any one occasion in the past three months have you have had more than 3 drinks containing alcohol:  No        Functional Ability and Level of Safety:    Hearing: Hearing is good.      Activities of Daily Living:  The home contains: no safety equipment.  Patient does total self care      Ambulation: with no difficulty     Fall Risk:  Fall Risk Assessment, last 12 mths 10/18/2019   Able to walk? Yes   Fall in past 12 months? 1   Do you feel unsteady? 1   Are you worried about falling 1   Is TUG test greater than 12 seconds? 1   Is the gait abnormal? 1   Number of falls in past 12 months 1   Fall with injury? 0      Abuse Screen:  Patient is not abused       Cognitive Screening    Has your family/caregiver stated  any concerns about your memory: no     Cognitive Screening: Formal screening does not appear to be clinically indicated    Assessment/Plan   Education and counseling provided:  Are appropriate based on today's review and evaluation  End-of-Life planning (with patient's consent)    Diagnoses and all orders for this visit:    1. Medicare annual wellness visit, subsequent    Other orders  -     pantoprazole (PROTONIX) 40 mg tablet; Take 1 Tab by mouth daily.  -     pravastatin (PRAVACHOL) 80 mg tablet; Take 1 Tab by mouth daily.  -     cholecalciferol (Vitamin D3) (2,000 UNITS /50 MCG) cap capsule; TAKE 1 CAPSULE BY MOUTH EVERY DAY  -     amLODIPine-benazepril (LotreL) 5-20 mg per capsule; Take 1 Cap by mouth daily.      Patient is here for Medicare wellness exam.  Anticipatory guidance discussed including the importance of sunscreen use, helmet use  and seatbelt use.  No depressive symptoms are reported.  Risk for falls is minimal.  Cognitive functioning appears to be intact.  Pneumococcal vaccines including Prevnar 13 and Pneumovax 23 are up-to-date.  Advanced care planning discussed with the patient and documented in a separate note.    Health Maintenance Due     Health Maintenance Due   Topic Date Due   ??? Foot Exam Q1  [birth year]04/1950   ??? COVID-19 Vaccine (1 of 2) 12/05/1954   ??? DTaP/Tdap/Td series (1 - Tdap) 12/05/1959   ??? Shingrix Vaccine Age 56> (1 of 2) 12/04/1988   ??? GLAUCOMA SCREENING Q2Y  04/09/2017   ??? Eye Exam Retinal or Dilated  04/09/2017   ??? Flu Vaccine (1) 05/09/2019   ??? MICROALBUMIN Q1  10/13/2019       Patient Care Team   Patient Care Team:  Delana Meyer, MD as PCP - General (Family Medicine)  Delana Meyer, MD as PCP - [facility] Empaneled Provider  Maree Krabbe, MD (Cardiology)    History     Patient Active Problem List   Diagnosis Code   ??? HTN (hypertension) I10   ??? Diabetes mellitus type 2, controlled (HCC) E11.9   ??? Coronary artery disease involving native coronary artery of native heart without  angina pectoris I25.10   ??? Dyslipidemia E78.5   ??? OSA on CPAP G47.33, Z99.89   ??? Severe obesity (HCC) E66.01   ??? Elevated sed rate R70.0   ??? Normocytic anemia D64.9   ??? Vitamin D deficiency E55.9     Past Medical History:   Diagnosis Date   ??? Abdominal pain 07/22/2015   ??? Anemia    ??? Back pain    ??? Bursitis of shoulder    ??? CAD (coronary artery disease)    ??? Cerumen impaction    ??? Chest pain, atypical 07/22/2015   ??? Diabetes (HCC)     no meds, A1c 09/28/17 6.6; avg bs 105; denies ss of hypo   ??? Diabetes mellitus out of control (HCC)    ??? Endocrine disease     thyroid   ??? Fatigue    ??? GERD (gastroesophageal reflux disease)     controlled with medicaitons   ??? Hip pain, right    ??? Hyperlipidemia, mild    ??? Hypertension     managed with medication   ??? Hypertension associated with diabetes (HCC) 07/22/2015   ??? 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 Left carotid bruit    ??? Lumbago 07/22/2015   ??? Morbid obesity (HCC) 07/22/2015    BMI 35.3   ??? OSA on CPAP 07/22/2015    uses CPAP   ??? Other ill-defined conditions(799.89)     cholesterol   ??? Other ill-defined conditions(799.89)     heart cath 96   ??? Shoulder pain, acute    ??? Shoulder pain, bilateral    ??? Sinusitis, acute maxillary    ??? Unstable angina (HCC) 07/06/2015   ??? Vitamin D deficiency    ??? Vitamin D deficiency 04/12/2019   ??? Weakness       Past Surgical History:   Procedure Laterality Date   ??? COLONOSCOPY N/A 03/01/2018    COLONOSCOPY/BMI 39 performed by Mazanec, Worthy Rancher, MD at [facility] ENDOSCOPY   ??? HX COLONOSCOPY     ??? HX HEART CATHETERIZATION  2016    without intervention   ??? HX HEENT      goiter/thyroid surgery   ??? HX  THYROIDECTOMY       Current Outpatient Medications   Medication Sig Dispense Refill   ??? gabapentin (Neurontin) 300 mg capsule Take 300 mg by mouth three (3) times daily.     ??? predniSONE (DELTASONE) 5 mg tablet Take 1 Tab by mouth daily (with breakfast). 30 Tab 1   ??? pantoprazole (PROTONIX) 40 mg tablet Take 1 Tab by mouth daily. 30 Tab 5    ??? lisinopriL (PRINIVIL, ZESTRIL) 20 mg tablet Take 1 Tab by mouth daily. 90 Tab 3   ??? amLODIPine-benazepril (LotreL) 5-20 mg per capsule Take 1 Cap by mouth daily. 90 Cap 3   ??? glipiZIDE SR (GLUCOTROL XL) 2.5 mg CR tablet TAKE 1 TABLET BY MOUTH DAILY 30 Tab 5   ??? cholecalciferol (Vitamin D3) (2,000 UNITS /50 MCG) cap capsule TAKE 1 CAPSULE BY MOUTH EVERY DAY 30 Cap 5   ??? pravastatin (PRAVACHOL) 80 mg tablet Take 1 Tab by mouth daily. 90 Tab 3   ??? famotidine (PEPCID) 20 mg tablet take 1 tablet by mouth once daily 90 Tab 3   ??? folic acid 161 mcg tablet Take 400 mcg by mouth daily.     ??? nitroglycerin (NITROSTAT) 0.4 mg SL tablet Place 1 sl under the tongue q 5 min prn cp, max 3 sl in a 15-min time period. Call 911 if no relief after the 3rd sl. 1 Bottle prn   ??? aspirin 81 mg chewable tablet Take 1 Tab by mouth daily. 30 Tab 11     Allergies   Allergen Reactions   ??? Ativan [Lorazepam] Other (comments)     Made her feel crazy and chest pressure   ??? Ativan [Lorazepam] Other (comments)     Chest fullness   ??? Other Medication Palpitations     Some type of decongestant   ??? Other Medication Other (comments)     Doesn't take decongestants, but can't recall why       Family History   Problem Relation Age of Onset   ??? Stroke Sister    ??? Cancer Sister         cervical   ??? Stroke Brother    ??? Diabetes Other    ??? Hypertension Other      Social History     Tobacco Use   ??? Smoking status: Former Smoker     Packs/day: 0.50     Years: 10.00     Pack years: 5.00     Start date: 1961     Quit date: 1971     Years since quitting: 50.1   ??? Smokeless tobacco: Never Used   ??? Tobacco comment: quit in 1971   Substance Use Topics   ??? Alcohol use: No

## 2019-10-19 LAB — METABOLIC PANEL, COMPREHENSIVE
A-G Ratio: 1.3 (ref 1.2–2.2)
ALT (SGPT): 16 IU/L (ref 0–32)
AST (SGOT): 15 IU/L (ref 0–40)
Albumin: 3.9 g/dL (ref 3.7–4.7)
Alk. phosphatase: 49 IU/L (ref 39–117)
BUN/Creatinine ratio: 26 (ref 12–28)
BUN: 24 mg/dL (ref 8–27)
Bilirubin, total: 0.4 mg/dL (ref 0.0–1.2)
CO2: 22 mmol/L (ref 20–29)
Calcium: 9.2 mg/dL (ref 8.7–10.3)
Chloride: 104 mmol/L (ref 96–106)
Creatinine: 0.92 mg/dL (ref 0.57–1.00)
GFR est AA: 68 mL/min/{1.73_m2} (ref >59)
GFR est non-AA: 59 mL/min/{1.73_m2} — ABNORMAL LOW (ref >59)
GLOBULIN, TOTAL: 3 g/dL (ref 1.5–4.5)
Glucose: 122 mg/dL — ABNORMAL HIGH (ref 65–99)
Potassium: 4.4 mmol/L (ref 3.5–5.2)
Protein, total: 6.9 g/dL (ref 6.0–8.5)
Sodium: 142 mmol/L (ref 134–144)

## 2019-10-19 LAB — HEMOGLOBIN A1C WITH EAG
Estimated average glucose: 148 mg/dL
Hemoglobin A1c: 6.8 % — ABNORMAL HIGH (ref 4.8–5.6)

## 2019-10-19 LAB — TSH 3RD GENERATION
TSH: 0.693 u[IU]/mL (ref 0.450–4.500)
TSH: 0.693 u[IU]/mL (ref 0.450–4.500)

## 2019-10-19 LAB — LIPID PANEL
Cholesterol, Total: 209 mg/dL — ABNORMAL HIGH (ref 100–199)
Cholesterol, total: 209 mg/dL — ABNORMAL HIGH (ref 100–199)
HDL Cholesterol: 92 mg/dL (ref >39)
HDL: 92 mg/dL (ref >39)
LDL Calculated: 107 mg/dL — ABNORMAL HIGH (ref 0–99)
LDL, calculated: 107 mg/dL — ABNORMAL HIGH (ref 0–99)
Triglyceride: 53 mg/dL (ref 0–149)
Triglycerides: 53 mg/dL (ref 0–149)
VLDL, calculated: 10 mg/dL (ref 5–40)
VLDL: 10 mg/dL (ref 5–40)

## 2019-10-19 LAB — COMPREHENSIVE METABOLIC PANEL
ALT: 16 IU/L (ref 0–32)
AST: 15 IU/L (ref 0–40)
Albumin/Globulin Ratio: 1.3 NA (ref 1.2–2.2)
Albumin: 3.9 g/dL (ref 3.7–4.7)
Alkaline Phosphatase: 49 IU/L (ref 39–117)
BUN: 24 mg/dL (ref 8–27)
Bun/Cre Ratio: 26 NA (ref 12–28)
CO2: 22 mmol/L (ref 20–29)
Calcium: 9.2 mg/dL (ref 8.7–10.3)
Chloride: 104 mmol/L (ref 96–106)
Creatinine: 0.92 mg/dL (ref 0.57–1.00)
EGFR IF NonAfrican American: 59 mL/min/{1.73_m2} — ABNORMAL LOW (ref >59)
GFR African American: 68 mL/min/{1.73_m2} (ref >59)
Globulin, Total: 3 g/dL (ref 1.5–4.5)
Glucose: 122 mg/dL — ABNORMAL HIGH (ref 65–99)
Potassium: 4.4 mmol/L (ref 3.5–5.2)
Sodium: 142 mmol/L (ref 134–144)
Total Bilirubin: 0.4 mg/dL (ref 0.0–1.2)
Total Protein: 6.9 g/dL (ref 6.0–8.5)

## 2019-10-19 LAB — HEMOGLOBIN A1C W/EAG
Hemoglobin A1C: 6.8 % — ABNORMAL HIGH (ref 4.8–5.6)
eAG: 148 mg/dL

## 2019-11-20 ENCOUNTER — Ambulatory Visit: Attending: Rheumatology | Primary: Family Medicine

## 2019-11-20 ENCOUNTER — Ambulatory Visit: Admit: 2019-11-20 | Discharge: 2019-11-20 | Payer: MEDICARE | Attending: Rheumatology | Primary: Family Medicine

## 2019-11-20 DIAGNOSIS — M353 Polymyalgia rheumatica: Secondary | ICD-10-CM

## 2019-11-20 MED ORDER — PREDNISONE 1 MG TAB
1 mg | ORAL_TABLET | ORAL | 1 refills | Status: DC
Start: 2019-11-20 — End: 2019-12-27

## 2019-11-20 NOTE — Progress Notes (Signed)
[facility] Rheumatology  Jeffie Pollock, M.D.  [address] [place] [address]  Office : [phone], Fax: [phone]     RHEUMATOLOGY OFFICE VISIT NOTE  Date of Visit:  11/20/2019 11:14 AM    Patient Information:  Name:  Sarah Chavez  DOB:  12-May-1941  Age:  79 y.o.   Gender:  female      Sarah Chavez is here today for follow-up of PMR.      Last visit: 10/12/2019      History of Present Illness: On talking to the patient today she states that she has no had any jaw pain with no temporal headaches since decreasing the prednisone dose from 10 mg daily to 5 mg daily. Has been having right shoulder pain and stiffness along with neck pain and stiffness.  Has been having more difficulty getting around also secondary to balance related issues.  Her current joint complaints are as mentioned below.    Since the last visit, patient is feeling "horrible".      Pain: 9/10  Location:  Some right shoulder and neck pain. Some right para spinal muscle pain. Some neck stiffness with pain with neck ROM. Bilateral feet pain and swelling with no warmth and redness. Bilateral ankle swelling with no pain with no buckling. Some right knee pain with swelling with no warmth and redness. Occasional buckling of the right knee.   Quality:  Throbbing to deep achy pain.   Modifying Factors:  Constant pain.   Associated Symptoms:  AM Stiffness: > 1 hour; Constant pain from the right shoulder to the right elbow. Some weakness of both the arms. No tingling, numbness or pain down the legs except for constant numbness of both her feet.   Fatigue: 3/10  MDHAQ: 1.7      Last TB screen:10+ years  TB result: Negative  ??  Current dose of steroids:prednisone??5 mg  How long on current dose of steroids:????2-3 weeks  How long on continuous steroid therapy:about 2 years   ??  Past DMARDs, if applicable (methotrexate, plaquenil/hydroxychloroquine, sulfasalazine, Arava/leflunomide):none  ??  Past biologics,??if applicable??(enbrel,  humira, simponi, cimzia, [place], [place], remicade, simponi aria, actemra, rituximab, Leonie Man, cosentyx):none  ??  Past NSAIDs, if applicable (motrin, aleve, naproxen, advil, ibuprofen, celebrex, voltaren/diclofenac, etc.)??ibuprofen.  ??  ??  Last BMD:??2013  Past osteoporosis drugs, if applicable (fosamax, actonel, boniva, reclast, prolia, forteo):none  ??  BMI:38.82  ??  Current exercise regimen, if KNL:ZJQB  Current vitamin D dose:OTC VIT D??  Current calcium dose:none  Fractures since last visit, if any:??none        The patient otherwise has no significant interval changes in health or medical history to report.     History Reviewed:    Past Medical History  Past Medical History:   Diagnosis Date   ??? Abdominal pain 07/22/2015   ??? Anemia    ??? Back pain    ??? Bursitis of shoulder    ??? CAD (coronary artery disease)    ??? Cerumen impaction    ??? Chest pain, atypical 07/22/2015   ??? Diabetes ([place])     no meds, A1c 09/28/17 6.6; avg bs 105; denies ss of hypo   ??? Diabetes mellitus out of control ([place])    ??? Endocrine disease     thyroid   ??? Fatigue    ??? GERD (gastroesophageal reflux disease)     controlled with medicaitons   ??? Hip pain, right    ??? Hyperlipidemia,  mild    ??? Hypertension     managed with medication   ??? Hypertension associated with diabetes ([place]) 07/22/2015   ??? 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 Left carotid bruit    ??? Lumbago 07/22/2015   ??? Morbid obesity ([place]) 07/22/2015    BMI 35.3   ??? OSA on CPAP 07/22/2015    uses CPAP   ??? Other ill-defined conditions(799.89)     cholesterol   ??? Other ill-defined conditions(799.89)     heart cath 96   ??? Shoulder pain, acute    ??? Shoulder pain, bilateral    ??? Sinusitis, acute maxillary    ??? Unstable angina ([place]) 07/06/2015   ??? Vitamin D deficiency    ??? Vitamin D deficiency 04/12/2019   ??? Weakness        Past Surgical History  Past Surgical History:   Procedure Laterality Date   ??? COLONOSCOPY N/A 03/01/2018    COLONOSCOPY/BMI 39 performed by Mazanec,  Dorene Ar, MD at [facility] ENDOSCOPY   ??? HX COLONOSCOPY     ??? HX HEART CATHETERIZATION  2016    without intervention   ??? HX HEENT      goiter/thyroid surgery   ??? HX THYROIDECTOMY         Family History  Family History   Problem Relation Age of Onset   ??? Stroke Sister    ??? Cancer Sister         cervical   ??? Stroke Brother    ??? Diabetes Other    ??? Hypertension Other        Social History  Social History     Socioeconomic History   ??? Marital status: SINGLE     Spouse name: Not on file   ??? Number of children: Not on file   ??? Years of education: Not on file   ??? Highest education level: Not on file   Tobacco Use   ??? Smoking status: Former Smoker     Packs/day: 0.50     Years: 10.00     Pack years: 5.00     Start date: 51     Quit date: 1971     Years since quitting: 50.2   ??? Smokeless tobacco: Never Used   ??? Tobacco comment: quit in 1971   Substance and Sexual Activity   ??? Alcohol use: No   ??? Drug use: No   Social History Narrative    ** Merged History Encounter **                    Allergy:  Allergies   Allergen Reactions   ??? Ativan [Lorazepam] Other (comments)     Made her feel crazy and chest pressure   ??? Ativan [Lorazepam] Other (comments)     Chest fullness   ??? Other Medication Palpitations     Some type of decongestant   ??? Other Medication Other (comments)     Doesn't take decongestants, but can't recall why         Current Medications:  Outpatient Encounter Medications as of 11/20/2019   Medication Sig Dispense Refill   ??? predniSONE (DELTASONE) 1 mg tablet Take 4 pills of the prednisone after breakfast. 120 Tab 1   ??? pantoprazole (PROTONIX) 40 mg tablet Take 1 Tab by mouth daily. 30 Tab 5   ??? pravastatin (PRAVACHOL) 80 mg tablet Take 1 Tab by mouth daily. 90 Tab 3   ??? cholecalciferol (Vitamin D3) (  2,000 UNITS /50 MCG) cap capsule TAKE 1 CAPSULE BY MOUTH EVERY DAY 30 Cap 5   ??? amLODIPine (Norvasc) 10 mg tablet Take 1 Tab by mouth daily. 90 Tab 3   ??? lisinopriL (PRINIVIL, ZESTRIL) 40 mg tablet Take 1 Tab by mouth daily. 90  Tab 3   ??? gabapentin (Neurontin) 300 mg capsule Take 300 mg by mouth three (3) times daily.     ??? predniSONE (DELTASONE) 5 mg tablet Take 1 Tab by mouth daily (with breakfast). 30 Tab 1   ??? glipiZIDE SR (GLUCOTROL XL) 2.5 mg CR tablet TAKE 1 TABLET BY MOUTH DAILY 30 Tab 5   ??? nitroglycerin (NITROSTAT) 0.4 mg SL tablet Place 1 sl under the tongue q 5 min prn cp, max 3 sl in a 15-min time period. Call 911 if no relief after the 3rd sl. 1 Bottle prn   ??? [DISCONTINUED] famotidine (PEPCID) 20 mg tablet take 1 tablet by mouth once daily 90 Tab 3   ??? [DISCONTINUED] folic acid 024 mcg tablet Take 400 mcg by mouth daily.     ??? [DISCONTINUED] aspirin 81 mg chewable tablet Take 1 Tab by mouth daily. 30 Tab 11     No facility-administered encounter medications on file as of 11/20/2019.            REVIEW OF SYSTEMS: The following systems were reviewed with patient today and were negative except for the following (depicted with an "X"):        "X" General  "X" Head and Neck  "X" Heart and Breathing  "X" Gastrointestinal    Fever/chills   Hair loss   Shortness of breath   Upset stomach   x Falls   Dry mouth   Coughing  x Diarrhea / constipation    Wt loss   Mouth sores   Wheezing   Heartburn    Wt gain   Ringing ears   Chest pain   Dark or bloody stools    Night sweats   Diff. swallowing  X None of above   Nausea or vomiting    None of above  X None of above      None of above                "X" Skin  "X" Neurology  "X" Urinary/Gyn  "X" Other    Easy bruising   Numbness/ tingling   Female problems  x Depression    Rashes   Weakness   Problems with urination   Feeling anxious    Sun sensitivity   Headaches  X None of above   Problems sleeping   X None of above  X None of above      None of above          Physical Exam:  Blood pressure (!) 179/61, pulse 70, height 4' 11"  (1.499 m), weight 192 lb 3.2 oz (87.2 kg).  General:  Patient alert, cooperative and in no apparent distress.  HEENT: Pupils equal reactive to light and  accommodation, minimal scleral injection noted.  Heart: Regular rate and rhythm, normal S1 and S2, no rubs or gallops.  Lungs: Clear to auscultation bilaterally.  Abdomen: Soft, nontender, no hepatosplenomegaly.  Skin:  No rashes. No nail abnormalities.  Neurologic:  Oriented, normal speech and affect.  Normal gait.    Extremities: 1-2+ pitting edema in bilateral lower extremities with no cyanosis or clubbing.    Muskoskeletal Exam:     I examined the shoulders,  elbows, wrists, MCPs, PIPs, DIPs and knees bilaterally for strength, range of motion, deformity, tenderness, swelling, and synovitis.      The findings are: Noted tenderness on palpation of the right shoulder both anteriorly as well as superiorly with dramatic limitation with the ability to abduct as well as internally rotate the right shoulder.  Does have tenderness on palpation of the C-spine as well as the paraspinal muscles of the neck with T-spine as well as L-spine tenderness and bilateral SI joint tenderness.  Does have tenderness on palpation of the right knee in the mid joint line with associated synovitis and overlying warmth but no redness.  Does have tenderness on palpation of the right ankle laterally with synovitis but no overlying warmth or redness.  Does have synovitis involving the left ankle laterally with no tenderness, warmth or redness.  Does have metatarsalgia involving the right foot.    Patient otherwise has a normal joint exam without other evidence of joint tenderness, synovitis, warmth, erythema, decreased ROM, weakness or deformities.         Radiology Reports Reviewed (if available):  Last 3 months  No results found.    Lab Reports Reviewed (if available): Last 3 months    Office Visit on 10/18/2019   Component Date Value Ref Range Status   ??? WBC (POC) 10/18/2019 9.3  4.1 - 10.9 10^3/ul Final   ??? ABS. LYMPHS (POC) 10/18/2019 1.2  0.6 - 4.1 10^3/ul Final   ??? Mid # (POC) 10/18/2019 0.4  0.0 - 1.8 10^3/ul Final   ??? ABS. GRANS (POC)  10/18/2019 7.7  2.0 - 7.8 10^3/ul Final   ??? LYMPHOCYTES (POC) 10/18/2019 13.2  10.0 - 58.5 % Final   ??? MID% POC 10/18/2019 4.4  0.1 - 24.0 % Final   ??? GRANULOCYTES (POC) 10/18/2019 82.4  37.0 - 92.0 % Final   ??? RBC (POC) 10/18/2019 4.19* 4.20 - 6.30 10^6/ul Final   ??? HGB (POC) 10/18/2019 11.8* 12.0 - 18.0 g/dL Final   ??? HCT (POC) 10/18/2019 37.1  37.0 - 51.0 % Final   ??? MCV (POC) 10/18/2019 88.6  80.0 - 97.0 fL Final   ??? MCH (POC) 10/18/2019 28.2  26.0 - 32.0 pg Final   ??? MCHC (POC) 10/18/2019 31.8  31.0 - 36.0 g/dL Final   ??? RDW (POC) 10/18/2019 14.8* 11.5 - 14.5 % Final   ??? PLATELET (POC) 10/18/2019 266  140 - 440 10^3/ul Final   ??? MPV (POC) 10/18/2019 8.0  0.0 - 49.9 fL Final   ??? Glucose 10/18/2019 122* 65 - 99 mg/dL Final   ??? BUN 10/18/2019 24  8 - 27 mg/dL Final   ??? Creatinine 10/18/2019 0.92  0.57 - 1.00 mg/dL Final   ??? GFR est non-AA 10/18/2019 59* >59 mL/min/1.73 Final   ??? GFR est AA 10/18/2019 68  >59 mL/min/1.73 Final   ??? BUN/Creatinine ratio 10/18/2019 26  12 - 28 Final   ??? Sodium 10/18/2019 142  134 - 144 mmol/L Final   ??? Potassium 10/18/2019 4.4  3.5 - 5.2 mmol/L Final   ??? Chloride 10/18/2019 104  96 - 106 mmol/L Final   ??? CO2 10/18/2019 22  20 - 29 mmol/L Final   ??? Calcium 10/18/2019 9.2  8.7 - 10.3 mg/dL Final   ??? Protein, total 10/18/2019 6.9  6.0 - 8.5 g/dL Final   ??? Albumin 10/18/2019 3.9  3.7 - 4.7 g/dL Final   ??? GLOBULIN, TOTAL 10/18/2019 3.0  1.5 - 4.5 g/dL Final   ???  A-G Ratio 10/18/2019 1.3  1.2 - 2.2 Final   ??? Bilirubin, total 10/18/2019 0.4  0.0 - 1.2 mg/dL Final   ??? Alk. phosphatase 10/18/2019 49  39 - 117 IU/L Final   ??? AST (SGOT) 10/18/2019 15  0 - 40 IU/L Final   ??? ALT (SGPT) 10/18/2019 16  0 - 32 IU/L Final   ??? Hemoglobin A1c 10/18/2019 6.8* 4.8 - 5.6 % Final    Comment:          Prediabetes: 5.7 - 6.4           Diabetes: >6.4           Glycemic control for adults with diabetes: <7.0     ??? Estimated average glucose 10/18/2019 148  mg/dL Final   ??? Cholesterol, total 10/18/2019 209* 100 -  199 mg/dL Final   ??? Triglyceride 10/18/2019 53  0 - 149 mg/dL Final   ??? HDL Cholesterol 10/18/2019 92  >39 mg/dL Final   ??? VLDL, calculated 10/18/2019 10  5 - 40 mg/dL Final   ??? LDL, calculated 10/18/2019 107* 0 - 99 mg/dL Final   ??? TSH 10/18/2019 0.693  0.450 - 4.500 uIU/mL Final   ??? Microalbumin urine (POC) 10/18/2019 100.0  MG/L Final   Office Visit on 10/12/2019   Component Date Value Ref Range Status   ??? C-Reactive Protein, Qt 10/12/2019 3  0 - 10 mg/L Final   ??? Sed rate (ESR) 10/12/2019 54* 0 - 40 mm/hr Final         The results above were reviewed and discussed with patient.         Assessment/Plan:   Sarah Chavez is a 79 y.o. female who presents with:     1. PMR (polymyalgia rheumatica) ([place]): Was instructed to continue prednisone but decrease the dose from 5 mg daily to 4 mg daily and remain on 4 mg daily until her follow-up visit with me in 6 weeks time.  If she starts having temporal headaches or jaw pain with double vision or blurring of vision she was instructed to call and keep me informed.  -     C REACTIVE PROTEIN, QT  -     SED RATE (ESR)  -     predniSONE (DELTASONE) 1 mg tablet; Take 4 pills of the prednisone after breakfast.    2. Primary osteoarthritis of right shoulder: I did refer the patient to see the orthopedist for the right shoulder problems.  -     REFERRAL TO ORTHOPEDICS      Disease activity plan:  As stated above.    Steroid management plan:  As stated above, if applicable.    Pain management plan:  As stated above, if applicable.    Weight management plan:  Weight loss through diet and exercise is always encouraged    Disease prognosis: Good        I appreciate the opportunity to continue to participate in the care of this patient.     Follow-up and Dispositions    ?? Return in about 6 weeks (around 01/01/2020).       Electronically signed by:  Jeffie Pollock, MD      This note was dictated using dragon voice recognition software.  It has been proofread, but there may  still exist voice recognition errors that the author did not detect.                --------------------------------------------------------------------------------------------------------------------------------------------------------------------------------------------------------------------------------

## 2019-11-21 ENCOUNTER — Ambulatory Visit: Attending: Family Medicine | Primary: Family Medicine

## 2019-11-21 ENCOUNTER — Ambulatory Visit: Admit: 2019-11-21 | Discharge: 2019-11-21 | Payer: MEDICARE | Attending: Family Medicine | Primary: Family Medicine

## 2019-11-21 DIAGNOSIS — B0229 Other postherpetic nervous system involvement: Secondary | ICD-10-CM

## 2019-11-21 LAB — SED RATE (ESR): Sed rate (ESR): 110 mm/hr — ABNORMAL HIGH (ref 0–40)

## 2019-11-21 LAB — C REACTIVE PROTEIN, QT: C-Reactive Protein, Qt: 13 mg/L — ABNORMAL HIGH (ref 0–10)

## 2019-11-21 LAB — C-REACTIVE PROTEIN: CRP: 13 mg/L — ABNORMAL HIGH (ref 0–10)

## 2019-11-21 LAB — SEDIMENTATION RATE: Sed Rate: 110 mm/hr — ABNORMAL HIGH (ref 0–40)

## 2019-11-21 MED ORDER — HYDROCHLOROTHIAZIDE 25 MG TAB
25 mg | ORAL_TABLET | Freq: Every day | ORAL | 5 refills | Status: DC
Start: 2019-11-21 — End: 2020-02-27

## 2019-11-21 MED ORDER — GABAPENTIN 300 MG CAP
300 mg | ORAL_CAPSULE | Freq: Three times a day (TID) | ORAL | 1 refills | Status: DC
Start: 2019-11-21 — End: 2020-01-15

## 2019-11-21 NOTE — Progress Notes (Signed)
SUBJECTIVE:   Sarah Chavez is a 79 y.o. female who has a past medical history significant for hypertension, diabetes, high cholesterol, polymyalgia rheumatica, overactive bladder.    Patient was recently been diagnosed with shingles on her right anterior chest and breast.  This is progressed into postherpetic neuralgia.  Please refer to prior notes for details.  Patient has been placed on Neurontin 300 mg 3 times a day through an urgent care center visit.  She reports that this is helping but she is going to run out of her Neurontin as of tomorrow.  She is asking for refill.    At previous visit I also adjusted her blood pressure medication to include increasing her Norvasc from 5mg  to 10 mg.  She remains on lisinopril 40 mg.  She has tolerated this well but does report that she has been noticing some lower extremity edema.  This is worse at the end of the day versus the beginning of the day.  She reports no associated shortness of breath, chest pain or orthopnea.    Current medicines are listed in the EMR and reviewed today.    HPI  See above    Past Medical History, Past Surgical History, Family history, Social History, and Medications were all reviewed with the patient today and updated as necessary.       Current Outpatient Medications   Medication Sig Dispense Refill   ??? hydroCHLOROthiazide (HYDRODIURIL) 25 mg tablet Take 1 Tab by mouth daily. 30 Tab 5   ??? gabapentin (Neurontin) 300 mg capsule Take 1 Cap by mouth three (3) times daily. 90 Cap 1   ??? predniSONE (DELTASONE) 1 mg tablet Take 4 pills of the prednisone after breakfast. 120 Tab 1   ??? pantoprazole (PROTONIX) 40 mg tablet Take 1 Tab by mouth daily. 30 Tab 5   ??? pravastatin (PRAVACHOL) 80 mg tablet Take 1 Tab by mouth daily. 90 Tab 3   ??? cholecalciferol (Vitamin D3) (2,000 UNITS /50 MCG) cap capsule TAKE 1 CAPSULE BY MOUTH EVERY DAY 30 Cap 5   ??? amLODIPine (Norvasc) 10 mg tablet Take 1 Tab by mouth daily. 90 Tab 3   ??? lisinopriL (PRINIVIL,  ZESTRIL) 40 mg tablet Take 1 Tab by mouth daily. 90 Tab 3   ??? glipiZIDE SR (GLUCOTROL XL) 2.5 mg CR tablet TAKE 1 TABLET BY MOUTH DAILY 30 Tab 5   ??? nitroglycerin (NITROSTAT) 0.4 mg SL tablet Place 1 sl under the tongue q 5 min prn cp, max 3 sl in a 15-min time period. Call 911 if no relief after the 3rd sl. 1 Bottle prn     Allergies   Allergen Reactions   ??? Ativan [Lorazepam] Other (comments)     Made her feel crazy and chest pressure   ??? Ativan [Lorazepam] Other (comments)     Chest fullness   ??? Other Medication Palpitations     Some type of decongestant   ??? Other Medication Other (comments)     Doesn't take decongestants, but can't recall why     Patient Active Problem List   Diagnosis Code   ??? HTN (hypertension) I10   ??? Diabetes mellitus type 2, controlled (HCC) E11.9   ??? Coronary artery disease involving native coronary artery of native heart without angina pectoris I25.10   ??? Dyslipidemia E78.5   ??? OSA on CPAP G47.33, Z99.89   ??? Severe obesity (HCC) E66.01   ??? Elevated sed rate R70.0   ??? Normocytic anemia D64.9   ???  Vitamin D deficiency E55.9   ??? Polymyalgia rheumatica (HCC) M35.3     Past Medical History:   Diagnosis Date   ??? Abdominal pain 07/22/2015   ??? Anemia    ??? Back pain    ??? Bursitis of shoulder    ??? CAD (coronary artery disease)    ??? Cerumen impaction    ??? Chest pain, atypical 07/22/2015   ??? Diabetes ([place])     no meds, A1c 09/28/17 6.6; avg bs 105; denies ss of hypo   ??? Diabetes mellitus out of control ([place])    ??? Endocrine disease     thyroid   ??? Fatigue    ??? GERD (gastroesophageal reflux disease)     controlled with medicaitons   ??? Hip pain, right    ??? Hyperlipidemia, mild    ??? Hypertension     managed with medication   ??? Hypertension associated with diabetes ([place]) 07/22/2015   ??? 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 Left carotid bruit    ??? Lumbago 07/22/2015   ??? Morbid obesity ([place]) 07/22/2015    BMI 35.3   ??? OSA on CPAP 07/22/2015    uses CPAP   ??? Other ill-defined  conditions(799.89)     cholesterol   ??? Other ill-defined conditions(799.89)     heart cath 96   ??? Shoulder pain, acute    ??? Shoulder pain, bilateral    ??? Sinusitis, acute maxillary    ??? Unstable angina ([place]) 07/06/2015   ??? Vitamin D deficiency    ??? Vitamin D deficiency 04/12/2019   ??? Weakness      Past Surgical History:   Procedure Laterality Date   ??? COLONOSCOPY N/A 03/01/2018    COLONOSCOPY/BMI 39 performed by Mazanec, Dorene Ar, MD at [facility] ENDOSCOPY   ??? HX COLONOSCOPY     ??? HX HEART CATHETERIZATION  2016    without intervention   ??? HX HEENT      goiter/thyroid surgery   ??? HX THYROIDECTOMY       Family History   Problem Relation Age of Onset   ??? Stroke Sister    ??? Cancer Sister         cervical   ??? Stroke Brother    ??? Diabetes Other    ??? Hypertension Other      Social History     Tobacco Use   ??? Smoking status: Former Smoker     Packs/day: 0.50     Years: 10.00     Pack years: 5.00     Start date: 1961     Quit date: 1971     Years since quitting: 50.2   ??? Smokeless tobacco: Never Used   ??? Tobacco comment: quit in 1971   Substance Use Topics   ??? Alcohol use: No         Review of Systems  See above    OBJECTIVE:  Visit Vitals  BP (!) 148/56   Ht 4\' 11"  (1.499 m)   BMI 38.82 kg/m??        Physical Exam  Constitutional:       Appearance: She is well-developed.   HENT:      Head: Normocephalic and atraumatic.   Eyes:      Pupils: Pupils are equal, round, and reactive to light.   Neck:      Musculoskeletal: Normal range of motion and neck supple.      Thyroid: No thyromegaly.  Vascular: No JVD.   Cardiovascular:      Rate and Rhythm: Normal rate and regular rhythm.      Heart sounds: Normal heart sounds. No murmur. No friction rub. No gallop.    Pulmonary:      Effort: Pulmonary effort is normal. No respiratory distress.      Breath sounds: No wheezing or rales.   Abdominal:      Palpations: Abdomen is soft.      Tenderness: There is no abdominal tenderness. There is no guarding or rebound.   Musculoskeletal: Normal range  of motion.      Comments: Patient has 1+ pitting edema up to the mid shins bilaterally.   Skin:     Findings: No rash.   Neurological:      Mental Status: She is alert and oriented to person, place, and time.         Medical problems and test results were reviewed with the patient today.         ASSESSMENT and PLAN    1.  Postherpetic neuralgia.  Continue Neurontin 300 mg 3 times a day for 1 more month.  We will consider starting to wean her off of it at follow-up.    2.  Hypertension.  Blood pressure is 148/56.  Previously 156/64.  Although improved she is not at goal.  Renal function and electrolytes are normal.  We will add HCTZ 25 mg once a day.  Reevaluate at follow-up.  Check in a month.  Sooner if needed.  Patient will monitor outside the office.    3.  Lower extremity edema.  Likely combination of dependent edema and inactivity as well as an increase in her Norvasc.  Adding HCTZ 25 mg a day for both blood pressure control and assistance with her edema.  Encouraged elevation of lower extremities and compression hose.    1.  Diabetes.  A1c is 6.8.  Microalbumin is 100 mg/L.  Continue current therapy.  Encourage yearly retinal exams and daily feet exams.    5.  High cholesterol.  LDL is 107.  Encourage better dietary management.  If not improving by next blood check will consider adjustment of statin therapy.    6.  CAD.  No chest pain reported.    7.  Polymyalgia rheumatica.  Per rheumatology.    Elements of this note have been dictated using speech recognition software. As a result, errors of speech recognition may have occurred.

## 2019-11-22 ENCOUNTER — Encounter: Attending: Family Medicine | Primary: Family Medicine

## 2019-12-12 MED ORDER — CHOLECALCIFEROL (VITAMIN D3) 2,000 UNIT CAPSULE
ORAL_CAPSULE | ORAL | 5 refills | Status: AC
Start: 2019-12-12 — End: ?

## 2019-12-12 MED ORDER — ONETOUCH ULTRA BLUE TEST STRIP
ORAL_STRIP | 5 refills | Status: AC
Start: 2019-12-12 — End: ?

## 2019-12-12 NOTE — Telephone Encounter (Signed)
Please send one touch ultra 2 test strips and vitamin D to [facility] at [place]

## 2019-12-19 ENCOUNTER — Encounter: Attending: Orthopaedic Surgery | Primary: Family Medicine

## 2019-12-27 ENCOUNTER — Ambulatory Visit: Attending: Rheumatology | Primary: Family Medicine

## 2019-12-27 ENCOUNTER — Ambulatory Visit: Admit: 2019-12-27 | Discharge: 2019-12-27 | Payer: MEDICARE | Attending: Rheumatology | Primary: Family Medicine

## 2019-12-27 DIAGNOSIS — M353 Polymyalgia rheumatica: Secondary | ICD-10-CM

## 2019-12-27 MED ORDER — PREDNISONE 1 MG TAB
1 mg | ORAL_TABLET | ORAL | 0 refills | Status: AC
Start: 2019-12-27 — End: ?

## 2019-12-27 NOTE — Progress Notes (Signed)
[facility]  Jeffie Pollock, M.D.  [address] [place] [address]  Office : [phone], Fax: [phone]     RHEUMATOLOGY OFFICE VISIT NOTE  Date of Visit:  12/27/2019 1:48 PM    Patient Information:  Name:  Sarah Chavez  DOB:  [birth year]  Age:  79 y.o.   Gender:  female      Ms. Koehler is here today for follow-up of PMR.      Last visit: 11/20/2019      History of Present Illness: On talking to the patient today she states that she has had a dry cough with no associated fever or chills. Has had Singles involving the right arm and was started on gabapentin for it.  She denies any temporal headaches with no jaw pain.  Has not had any double vision or blurring of vision though.  Her current joint complaints are as mentioned below.    Since the last visit, patient is feeling "fair".      Pain: 8/10  Location:  Some right shoulder pain worse than the left shoulder pain. Some left para spinal muscle pain. No neck stiffness with no pain with neck ROM. Occasional frontal headaches. Some left thumb pain. Bilateral ankle swelling with pain with no overlying warmth and redness. Some swelling of the feet on the dorsum with no overlying warmth and redness.   Quality: Deep achy pain.   Modifying Factors:  1st thing in the morning the pain and stiffness is the worst.   Associated Symptoms:  AM Stiffness: 1 hour; No tingling, numbness or pain down the arms with constant tingling and numbness of the hands and feet. Has a weak grip with difficulty opening jars with some difficulty dressing and undressing.   Fatigue: 10/10  MDHAQ: 1.7      Last TB screen:10+ years  TB result: Negative  ??  Current dose of steroids:prednisone??4??mg  How long on current dose of steroids:????5 weeks  How long on continuous steroid therapy: About 2 years??  ??  Past DMARDs, if applicable (methotrexate, plaquenil/hydroxychloroquine, sulfasalazine, Arava/leflunomide):none  ??  Past biologics,??if applicable??(enbrel,  humira, simponi, cimzia, [place], [place], remicade, simponi aria, actemra, rituximab, [place]ie [place], cosentyx):none  ??  Past NSAIDs, if applicable (motrin, aleve, naproxen, advil, ibuprofen, celebrex, voltaren/diclofenac, etc.)??ibuprofen.  ??  ??  Last BMD:??2013  Past osteoporosis drugs, if applicable (fosamax, actonel, boniva, reclast, prolia, forteo):none  ??  BMI:39.12  ??  Current exercise regimen, if FFM:BWGY  Current vitamin D dose:OTC VIT D??  Current calcium dose:none  Fractures since last visit, if any:??none    The patient otherwise has no significant interval changes in health or medical history to report.     History Reviewed:    Past Medical History  Past Medical History:   Diagnosis Date   ??? Abdominal pain 07/22/2015   ??? Anemia    ??? Back pain    ??? Bursitis of shoulder    ??? CAD (coronary artery disease)    ??? Cerumen impaction    ??? Chest pain, atypical 07/22/2015   ??? Diabetes ([place])     no meds, A1c 09/28/17 6.6; avg bs 105; denies ss of hypo   ??? Diabetes mellitus out of control ([place])    ??? Endocrine disease     thyroid   ??? Fatigue    ??? GERD (gastroesophageal reflux disease)     controlled with medicaitons   ??? Hip pain, right    ??? Hyperlipidemia, mild    ???  Hypertension     managed with medication   ??? Hypertension associated with diabetes ([place]) 07/22/2015   ??? 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 Left carotid bruit    ??? Lumbago 07/22/2015   ??? Morbid obesity ([place]) 07/22/2015    BMI 35.3   ??? OSA on CPAP 07/22/2015    uses CPAP   ??? Other ill-defined conditions(799.89)     cholesterol   ??? Other ill-defined conditions(799.89)     heart cath 96   ??? Shoulder pain, acute    ??? Shoulder pain, bilateral    ??? Sinusitis, acute maxillary    ??? Unstable angina ([place]) 07/06/2015   ??? Vitamin D deficiency    ??? Vitamin D deficiency 04/12/2019   ??? Weakness        Past Surgical History  Past Surgical History:   Procedure Laterality Date   ??? COLONOSCOPY N/A 03/01/2018    COLONOSCOPY/BMI 39 performed by [place],  Dorene Ar, MD at [facility] ENDOSCOPY   ??? HX COLONOSCOPY     ??? HX HEART CATHETERIZATION  2016    without intervention   ??? HX HEENT      goiter/thyroid surgery   ??? HX THYROIDECTOMY         Family History  Family History   Problem Relation Age of Onset   ??? Stroke Sister    ??? Cancer Sister         cervical   ??? Stroke Brother    ??? Diabetes Other    ??? Hypertension Other        Social History  Social History     Socioeconomic History   ??? Marital status: SINGLE     Spouse name: Not on file   ??? Number of children: Not on file   ??? Years of education: Not on file   ??? Highest education level: Not on file   Tobacco Use   ??? Smoking status: Former Smoker     Packs/day: 0.50     Years: 10.00     Pack years: 5.00     Start date: 69     Quit date: 1971     Years since quitting: 50.3   ??? Smokeless tobacco: Never Used   ??? Tobacco comment: quit in 1971   Substance and Sexual Activity   ??? Alcohol use: No   ??? Drug use: No   Social History Narrative    ** Merged History Encounter **                    Allergy:  Allergies   Allergen Reactions   ??? Ativan [Lorazepam] Other (comments)     Made her feel crazy and chest pressure   ??? Ativan [Lorazepam] Other (comments)     Chest fullness   ??? Other Medication Palpitations     Some type of decongestant   ??? Other Medication Other (comments)     Doesn't take decongestants, but can't recall why         Current Medications:  Outpatient Encounter Medications as of 12/27/2019   Medication Sig Dispense Refill   ??? predniSONE (DELTASONE) 1 mg tablet Take 4 pills of the prednisone after breakfast. 120 Tab 0   ??? glucose blood VI test strips (OneTouch Ultra Blue Test Strip) strip Test glucose BID 100 Strip 5   ??? cholecalciferol (Vitamin D3) (2,000 UNITS /50 MCG) cap capsule TAKE 1 CAPSULE BY MOUTH EVERY DAY 30 Cap 5   ???  hydroCHLOROthiazide (HYDRODIURIL) 25 mg tablet Take 1 Tab by mouth daily. 30 Tab 5   ??? gabapentin (Neurontin) 300 mg capsule Take 1 Cap by mouth three (3) times daily. 90 Cap 1   ??? pantoprazole  (PROTONIX) 40 mg tablet Take 1 Tab by mouth daily. 30 Tab 5   ??? pravastatin (PRAVACHOL) 80 mg tablet Take 1 Tab by mouth daily. 90 Tab 3   ??? amLODIPine (Norvasc) 10 mg tablet Take 1 Tab by mouth daily. 90 Tab 3   ??? lisinopriL (PRINIVIL, ZESTRIL) 40 mg tablet Take 1 Tab by mouth daily. 90 Tab 3   ??? glipiZIDE SR (GLUCOTROL XL) 2.5 mg CR tablet TAKE 1 TABLET BY MOUTH DAILY 30 Tab 5   ??? nitroglycerin (NITROSTAT) 0.4 mg SL tablet Place 1 sl under the tongue q 5 min prn cp, max 3 sl in a 15-min time period. Call 911 if no relief after the 3rd sl. 1 Bottle prn   ??? [DISCONTINUED] predniSONE (DELTASONE) 1 mg tablet Take 4 pills of the prednisone after breakfast. 120 Tab 1     No facility-administered encounter medications on file as of 12/27/2019.            REVIEW OF SYSTEMS: The following systems were reviewed with patient today and were negative except for the following (depicted with an "X"):        "X" General  "X" Head and Neck  "X" Heart and Breathing  "X" Gastrointestinal    Fever/chills   Hair loss   Shortness of breath   Upset stomach    Falls   Dry mouth   Coughing   Diarrhea / constipation    Wt loss   Mouth sores  x Wheezing   Heartburn    Wt gain   Ringing ears   Chest pain   Dark or bloody stools    Night sweats  x Diff. swallowing   None of above   Nausea or vomiting   X None of above   None of above     X None of above                "X" Skin  "X" Neurology  "X" Urinary/Gyn  "X" Other    Easy bruising  x Numbness/ tingling   Female problems  x Depression    Rashes  x Weakness   Problems with urination   Feeling anxious    Sun sensitivity   Headaches  X None of above   Problems sleeping   X None of above   None of above      None of above          Physical Exam:  Blood pressure (!) 156/61, pulse 87, height '4\' 11"'$  (1.499 m), weight 194 lb (88 kg).  General:  Patient alert, cooperative and in no apparent distress.  HEENT: Pupils equally reactive to light and accommodation, minimal scleral injection noted.   Heart: Regular rate and rhythm, normal S1 and S2, no rubs or gallops.  Lungs: Clear to auscultation bilaterally.  Abdomen: Soft, nontender, no hepatosplenomegaly.  Skin:  No rashes. No nail abnormalities.  Neurologic:  Oriented, normal speech and affect.  Normal gait.    Extremities:  No edema in bilateral lower extremities with no cyanosis or clubbing.    Muskoskeletal Exam:     I examined the shoulders, elbows, wrists, MCPs, PIPs, DIPs and knees bilaterally for strength, range of motion, deformity, tenderness, swelling, and synovitis.  The findings are: Does have tenderness on palpation of the wrists no synovitis, warmth or redness. Does have limited range of motion to flexion as well as extension of the wrists. Does have tenderness on palpation of the shoulders both anteriorly as well as superiorly with intact range of motion to abduction as well as internal rotation.  Does have tenderness on palpation of the C-spine as well as the paraspinal muscles of the neck with tenderness on palpation of the L-spine with bilateral SI joint tenderness. Does have tenderness on palpation of the knees in the mid joint line with trace synovitis but no overlying warmth or redness. Does have tenderness on palpation of the ankles laterally with trace synovitis but no overlying warmth or redness. Does have metatarsalgia in both the feet.    Patient otherwise has a normal joint exam without other evidence of joint tenderness, synovitis, warmth, erythema, decreased ROM, weakness or deformities.         Radiology Reports Reviewed (if available):  Last 3 months  No results found.    Lab Reports Reviewed (if available): Last 3 months    Office Visit on 11/20/2019   Component Date Value Ref Range Status   ??? C-Reactive Protein, Qt 11/20/2019 13* 0 - 10 mg/L Final   ??? Sed rate (ESR) 11/20/2019 110* 0 - 40 mm/hr Final   Office Visit on 10/18/2019   Component Date Value Ref Range Status   ??? WBC (POC) 10/18/2019 9.3  4.1 - 10.9 10^3/ul  Final   ??? ABS. LYMPHS (POC) 10/18/2019 1.2  0.6 - 4.1 10^3/ul Final   ??? Mid # (POC) 10/18/2019 0.4  0.0 - 1.8 10^3/ul Final   ??? ABS. GRANS (POC) 10/18/2019 7.7  2.0 - 7.8 10^3/ul Final   ??? LYMPHOCYTES (POC) 10/18/2019 13.2  10.0 - 58.5 % Final   ??? MID% POC 10/18/2019 4.4  0.1 - 24.0 % Final   ??? GRANULOCYTES (POC) 10/18/2019 82.4  37.0 - 92.0 % Final   ??? RBC (POC) 10/18/2019 4.19* 4.20 - 6.30 10^6/ul Final   ??? HGB (POC) 10/18/2019 11.8* 12.0 - 18.0 g/dL Final   ??? HCT (POC) 10/18/2019 37.1  37.0 - 51.0 % Final   ??? MCV (POC) 10/18/2019 88.6  80.0 - 97.0 fL Final   ??? MCH (POC) 10/18/2019 28.2  26.0 - 32.0 pg Final   ??? MCHC (POC) 10/18/2019 31.8  31.0 - 36.0 g/dL Final   ??? RDW (POC) 10/18/2019 14.8* 11.5 - 14.5 % Final   ??? PLATELET (POC) 10/18/2019 266  140 - 440 10^3/ul Final   ??? MPV (POC) 10/18/2019 8.0  0.0 - 49.9 fL Final   ??? Glucose 10/18/2019 122* 65 - 99 mg/dL Final   ??? BUN 10/18/2019 24  8 - 27 mg/dL Final   ??? Creatinine 10/18/2019 0.92  0.57 - 1.00 mg/dL Final   ??? GFR est non-AA 10/18/2019 59* >59 mL/min/1.73 Final   ??? GFR est AA 10/18/2019 68  >59 mL/min/1.73 Final   ??? BUN/Creatinine ratio 10/18/2019 26  12 - 28 Final   ??? Sodium 10/18/2019 142  134 - 144 mmol/L Final   ??? Potassium 10/18/2019 4.4  3.5 - 5.2 mmol/L Final   ??? Chloride 10/18/2019 104  96 - 106 mmol/L Final   ??? CO2 10/18/2019 22  20 - 29 mmol/L Final   ??? Calcium 10/18/2019 9.2  8.7 - 10.3 mg/dL Final   ??? Protein, total 10/18/2019 6.9  6.0 - 8.5 g/dL Final   ???  Albumin 10/18/2019 3.9  3.7 - 4.7 g/dL Final   ??? GLOBULIN, TOTAL 10/18/2019 3.0  1.5 - 4.5 g/dL Final   ??? A-G Ratio 10/18/2019 1.3  1.2 - 2.2 Final   ??? Bilirubin, total 10/18/2019 0.4  0.0 - 1.2 mg/dL Final   ??? Alk. phosphatase 10/18/2019 49  39 - 117 IU/L Final   ??? AST (SGOT) 10/18/2019 15  0 - 40 IU/L Final   ??? ALT (SGPT) 10/18/2019 16  0 - 32 IU/L Final   ??? Hemoglobin A1c 10/18/2019 6.8* 4.8 - 5.6 % Final    Comment:          Prediabetes: 5.7 - 6.4           Diabetes: >6.4           Glycemic  control for adults with diabetes: <7.0     ??? Estimated average glucose 10/18/2019 148  mg/dL Final   ??? Cholesterol, total 10/18/2019 209* 100 - 199 mg/dL Final   ??? Triglyceride 10/18/2019 53  0 - 149 mg/dL Final   ??? HDL Cholesterol 10/18/2019 92  >39 mg/dL Final   ??? VLDL, calculated 10/18/2019 10  5 - 40 mg/dL Final   ??? LDL, calculated 10/18/2019 107* 0 - 99 mg/dL Final   ??? TSH 10/18/2019 0.693  0.450 - 4.500 uIU/mL Final   ??? Microalbumin urine (POC) 10/18/2019 100.0  MG/L Final   Office Visit on 10/12/2019   Component Date Value Ref Range Status   ??? C-Reactive Protein, Qt 10/12/2019 3  0 - 10 mg/L Final   ??? Sed rate (ESR) 10/12/2019 54* 0 - 40 mm/hr Final         The results above were reviewed and discussed with patient.         Assessment/Plan:   Lariyah Shetterly is a 79 y.o. female who presents with:     1. PMR (polymyalgia rheumatica) ([place]): Was instructed to remain on prednisone 4 mg once a day for now.  If she starts having recurrence of temporal headaches with jaw pain or double vision/blurring of vision she was instructed to call our office for increment in the dose of prednisone or go to the nearest ER.  Patient did verbalize understanding.  -     predniSONE (DELTASONE) 1 mg tablet; Take 4 pills of the prednisone after breakfast.  -     C REACTIVE PROTEIN, QT  -     SED RATE (ESR)    Disease activity plan:  As stated above.    Steroid management plan:  As stated above, if applicable.    Pain management plan:  As stated above, if applicable.    Weight management plan:  Weight loss through diet and exercise is always encouraged    Disease prognosis: Good        I appreciate the opportunity to continue to participate in the care of this patient.     Follow-up and Dispositions    ?? Return in about 11 weeks (around 03/13/2020).       Electronically signed by:  Jeffie Pollock, MD      This note was dictated using dragon voice recognition software.  It has been proofread, but there may still exist voice  recognition errors that the author did not detect.                --------------------------------------------------------------------------------------------------------------------------------------------------------------------------------------------------------------------------------

## 2019-12-27 NOTE — Progress Notes (Signed)
[facility]  Jeffie Pollock, M.D.  [address] [place] [address]  Office : [phone], Fax: [phone]     RHEUMATOLOGY OFFICE VISIT NOTE  Date of Visit:  12/27/2019 1:48 PM    Patient Information:  Name:  Sarah Chavez  DOB:  [birth year]  Age:  79 y.o.   Gender:  female      Sarah Chavez is here today for follow-up of PMR.      Last visit: 11/20/2019      History of Present Illness: On talking to the patient today she states that she has had a dry cough with no associated fever or chills. Has had Singles involving the right arm and was started on gabapentin for it.  She denies any temporal headaches with no jaw pain.  Has not had any double vision or blurring of vision though.  Her current joint complaints are as mentioned below.    Since the last visit, patient is feeling "fair".      Pain: 8/10  Location:  Some right shoulder pain worse than the left shoulder pain. Some left para spinal muscle pain. No neck stiffness with no pain with neck ROM. Occasional frontal headaches. Some left thumb pain. Bilateral ankle swelling with pain with no overlying warmth and redness. Some swelling of the feet on the dorsum with no overlying warmth and redness.   Quality: Deep achy pain.   Modifying Factors:  1st thing in the morning the pain and stiffness is the worst.   Associated Symptoms:  AM Stiffness: 1 hour; No tingling, numbness or pain down the arms with constant tingling and numbness of the hands and feet. Has a weak grip with difficulty opening jars with some difficulty dressing and undressing.   Fatigue: 10/10  MDHAQ: 1.7      Last TB screen:10+ years  TB result: Negative  ??  Current dose of steroids:prednisone??4??mg  How long on current dose of steroids:????5 weeks  How long on continuous steroid therapy: About 2 years??  ??  Past DMARDs, if applicable (methotrexate, plaquenil/hydroxychloroquine, sulfasalazine, Arava/leflunomide):none  ??  Past biologics,??if applicable??(enbrel,  humira, simponi, cimzia, [place], [place], remicade, simponi aria, actemra, rituximab, Leonie [place], cosentyx):none  ??  Past NSAIDs, if applicable (motrin, aleve, naproxen, advil, ibuprofen, celebrex, voltaren/diclofenac, etc.)??ibuprofen.  ??  ??  Last BMD:??2013  Past osteoporosis drugs, if applicable (fosamax, actonel, boniva, reclast, prolia, forteo):none  ??  BMI:39.12  ??  Current exercise regimen, if FFM:BWGY  Current vitamin D dose:OTC VIT D??  Current calcium dose:none  Fractures since last visit, if any:??none    The patient otherwise has no significant interval changes in health or medical history to report.     History Reviewed:    Past Medical History  Past Medical History:   Diagnosis Date   ??? Abdominal pain 07/22/2015   ??? Anemia    ??? Back pain    ??? Bursitis of shoulder    ??? CAD (coronary artery disease)    ??? Cerumen impaction    ??? Chest pain, atypical 07/22/2015   ??? Diabetes ([place])     no meds, A1c 09/28/17 6.6; avg bs 105; denies ss of hypo   ??? Diabetes mellitus out of control ([place])    ??? Endocrine disease     thyroid   ??? Fatigue    ??? GERD (gastroesophageal reflux disease)     controlled with medicaitons   ??? Hip pain, right    ??? Hyperlipidemia, mild    ???  Hypertension     managed with medication   ??? Hypertension associated with diabetes ([place]) 07/22/2015   ??? 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 Left carotid bruit    ??? Lumbago 07/22/2015   ??? Morbid obesity ([place]) 07/22/2015    BMI 35.3   ??? OSA on CPAP 07/22/2015    uses CPAP   ??? Other ill-defined conditions(799.89)     cholesterol   ??? Other ill-defined conditions(799.89)     heart cath 96   ??? Shoulder pain, acute    ??? Shoulder pain, bilateral    ??? Sinusitis, acute maxillary    ??? Unstable angina ([place]) 07/06/2015   ??? Vitamin D deficiency    ??? Vitamin D deficiency 04/12/2019   ??? Weakness        Past Surgical History  Past Surgical History:   Procedure Laterality Date   ??? COLONOSCOPY N/A 03/01/2018    COLONOSCOPY/BMI 39 performed by Mazanec,  Dorene Ar, MD at [facility] ENDOSCOPY   ??? HX COLONOSCOPY     ??? HX HEART CATHETERIZATION  2016    without intervention   ??? HX HEENT      goiter/thyroid surgery   ??? HX THYROIDECTOMY         Family History  Family History   Problem Relation Age of Onset   ??? Stroke Sister    ??? Cancer Sister         cervical   ??? Stroke Brother    ??? Diabetes Other    ??? Hypertension Other        Social History  Social History     Socioeconomic History   ??? Marital status: SINGLE     Spouse name: Not on file   ??? Number of children: Not on file   ??? Years of education: Not on file   ??? Highest education level: Not on file   Tobacco Use   ??? Smoking status: Former Smoker     Packs/day: 0.50     Years: 10.00     Pack years: 5.00     Start date: 69     Quit date: 1971     Years since quitting: 50.3   ??? Smokeless tobacco: Never Used   ??? Tobacco comment: quit in 1971   Substance and Sexual Activity   ??? Alcohol use: No   ??? Drug use: No   Social History Narrative    ** Merged History Encounter **                    Allergy:  Allergies   Allergen Reactions   ??? Ativan [Lorazepam] Other (comments)     Made her feel crazy and chest pressure   ??? Ativan [Lorazepam] Other (comments)     Chest fullness   ??? Other Medication Palpitations     Some type of decongestant   ??? Other Medication Other (comments)     Doesn't take decongestants, but can't recall why         Current Medications:  Outpatient Encounter Medications as of 12/27/2019   Medication Sig Dispense Refill   ??? predniSONE (DELTASONE) 1 mg tablet Take 4 pills of the prednisone after breakfast. 120 Tab 0   ??? glucose blood VI test strips (OneTouch Ultra Blue Test Strip) strip Test glucose BID 100 Strip 5   ??? cholecalciferol (Vitamin D3) (2,000 UNITS /50 MCG) cap capsule TAKE 1 CAPSULE BY MOUTH EVERY DAY 30 Cap 5   ???  hydroCHLOROthiazide (HYDRODIURIL) 25 mg tablet Take 1 Tab by mouth daily. 30 Tab 5   ??? gabapentin (Neurontin) 300 mg capsule Take 1 Cap by mouth three (3) times daily. 90 Cap 1   ??? pantoprazole  (PROTONIX) 40 mg tablet Take 1 Tab by mouth daily. 30 Tab 5   ??? pravastatin (PRAVACHOL) 80 mg tablet Take 1 Tab by mouth daily. 90 Tab 3   ??? amLODIPine (Norvasc) 10 mg tablet Take 1 Tab by mouth daily. 90 Tab 3   ??? lisinopriL (PRINIVIL, ZESTRIL) 40 mg tablet Take 1 Tab by mouth daily. 90 Tab 3   ??? glipiZIDE SR (GLUCOTROL XL) 2.5 mg CR tablet TAKE 1 TABLET BY MOUTH DAILY 30 Tab 5   ??? nitroglycerin (NITROSTAT) 0.4 mg SL tablet Place 1 sl under the tongue q 5 min prn cp, max 3 sl in a 15-min time period. Call 911 if no relief after the 3rd sl. 1 Bottle prn   ??? [DISCONTINUED] predniSONE (DELTASONE) 1 mg tablet Take 4 pills of the prednisone after breakfast. 120 Tab 1     No facility-administered encounter medications on file as of 12/27/2019.            REVIEW OF SYSTEMS: The following systems were reviewed with patient today and were negative except for the following (depicted with an "X"):        "X" General  "X" Head and Neck  "X" Heart and Breathing  "X" Gastrointestinal    Fever/chills   Hair loss   Shortness of breath   Upset stomach    Falls   Dry mouth   Coughing   Diarrhea / constipation    Wt loss   Mouth sores  x Wheezing   Heartburn    Wt gain   Ringing ears   Chest pain   Dark or bloody stools    Night sweats  x Diff. swallowing   None of above   Nausea or vomiting   X None of above   None of above     X None of above                "X" Skin  "X" Neurology  "X" Urinary/Gyn  "X" Other    Easy bruising  x Numbness/ tingling   Female problems  x Depression    Rashes  x Weakness   Problems with urination   Feeling anxious    Sun sensitivity   Headaches  X None of above   Problems sleeping   X None of above   None of above      None of above          Physical Exam:  Blood pressure (!) 156/61, pulse 87, height _0  (1.499 m), weight 194 lb (88 kg).  General:  Patient alert, cooperative and in no apparent distress.  HEENT: Pupils equally reactive to light and accommodation, minimal scleral injection  noted.  Heart: Regular rate and rhythm, normal S1 and S2, no rubs or gallops.  Lungs: Clear to auscultation bilaterally.  Abdomen: Soft, nontender, no hepatosplenomegaly.  Skin:  No rashes. No nail abnormalities.  Neurologic:  Oriented, normal speech and affect.  Normal gait.    Extremities:  No edema in bilateral lower extremities with no cyanosis or clubbing.    Muskoskeletal Exam:     I examined the shoulders, elbows, wrists, MCPs, PIPs, DIPs and knees bilaterally for strength, range of motion, deformity, tenderness, swelling, and synovitis.  The findings are: Does have tenderness on palpation of the wrists no synovitis, warmth or redness. Does have limited range of motion to flexion as well as extension of the wrists. Does have tenderness on palpation of the shoulders both anteriorly as well as superiorly with intact range of motion to abduction as well as internal rotation.  Does have tenderness on palpation of the C-spine as well as the paraspinal muscles of the neck with tenderness on palpation of the L-spine with bilateral SI joint tenderness. Does have tenderness on palpation of the knees in the mid joint line with trace synovitis but no overlying warmth or redness. Does have tenderness on palpation of the ankles laterally with trace synovitis but no overlying warmth or redness. Does have metatarsalgia in both the feet.    Patient otherwise has a normal joint exam without other evidence of joint tenderness, synovitis, warmth, erythema, decreased ROM, weakness or deformities.         Radiology Reports Reviewed (if available):  Last 3 months  No results found.    Lab Reports Reviewed (if available): Last 3 months    Office Visit on 11/20/2019   Component Date Value Ref Range Status   ??? C-Reactive Protein, Qt 11/20/2019 13* 0 - 10 mg/L Final   ??? Sed rate (ESR) 11/20/2019 110* 0 - 40 mm/hr Final   Office Visit on 10/18/2019   Component Date Value Ref Range Status   ??? WBC (POC) 10/18/2019 9.3  4.1 - 10.9  10^3/ul Final   ??? ABS. LYMPHS (POC) 10/18/2019 1.2  0.6 - 4.1 10^3/ul Final   ??? Mid # (POC) 10/18/2019 0.4  0.0 - 1.8 10^3/ul Final   ??? ABS. GRANS (POC) 10/18/2019 7.7  2.0 - 7.8 10^3/ul Final   ??? LYMPHOCYTES (POC) 10/18/2019 13.2  10.0 - 58.5 % Final   ??? MID% POC 10/18/2019 4.4  0.1 - 24.0 % Final   ??? GRANULOCYTES (POC) 10/18/2019 82.4  37.0 - 92.0 % Final   ??? RBC (POC) 10/18/2019 4.19* 4.20 - 6.30 10^6/ul Final   ??? HGB (POC) 10/18/2019 11.8* 12.0 - 18.0 g/dL Final   ??? HCT (POC) 10/18/2019 37.1  37.0 - 51.0 % Final   ??? MCV (POC) 10/18/2019 88.6  80.0 - 97.0 fL Final   ??? MCH (POC) 10/18/2019 28.2  26.0 - 32.0 pg Final   ??? MCHC (POC) 10/18/2019 31.8  31.0 - 36.0 g/dL Final   ??? RDW (POC) 10/18/2019 14.8* 11.5 - 14.5 % Final   ??? PLATELET (POC) 10/18/2019 266  140 - 440 10^3/ul Final   ??? MPV (POC) 10/18/2019 8.0  0.0 - 49.9 fL Final   ??? Glucose 10/18/2019 122* 65 - 99 mg/dL Final   ??? BUN 10/18/2019 24  8 - 27 mg/dL Final   ??? Creatinine 10/18/2019 0.92  0.57 - 1.00 mg/dL Final   ??? GFR est non-AA 10/18/2019 59* >59 mL/min/1.73 Final   ??? GFR est AA 10/18/2019 68  >59 mL/min/1.73 Final   ??? BUN/Creatinine ratio 10/18/2019 26  12 - 28 Final   ??? Sodium 10/18/2019 142  134 - 144 mmol/L Final   ??? Potassium 10/18/2019 4.4  3.5 - 5.2 mmol/L Final   ??? Chloride 10/18/2019 104  96 - 106 mmol/L Final   ??? CO2 10/18/2019 22  20 - 29 mmol/L Final   ??? Calcium 10/18/2019 9.2  8.7 - 10.3 mg/dL Final   ??? Protein, total 10/18/2019 6.9  6.0 - 8.5 g/dL Final   ???  Albumin 10/18/2019 3.9  3.7 - 4.7 g/dL Final   ??? GLOBULIN, TOTAL 10/18/2019 3.0  1.5 - 4.5 g/dL Final   ??? A-G Ratio 10/18/2019 1.3  1.2 - 2.2 Final   ??? Bilirubin, total 10/18/2019 0.4  0.0 - 1.2 mg/dL Final   ??? Alk. phosphatase 10/18/2019 49  39 - 117 IU/L Final   ??? AST (SGOT) 10/18/2019 15  0 - 40 IU/L Final   ??? ALT (SGPT) 10/18/2019 16  0 - 32 IU/L Final   ??? Hemoglobin A1c 10/18/2019 6.8* 4.8 - 5.6 % Final    Comment:          Prediabetes: 5.7 - 6.4           Diabetes: >6.4            Glycemic control for adults with diabetes: <7.0     ??? Estimated average glucose 10/18/2019 148  mg/dL Final   ??? Cholesterol, total 10/18/2019 209* 100 - 199 mg/dL Final   ??? Triglyceride 10/18/2019 53  0 - 149 mg/dL Final   ??? HDL Cholesterol 10/18/2019 92  >39 mg/dL Final   ??? VLDL, calculated 10/18/2019 10  5 - 40 mg/dL Final   ??? LDL, calculated 10/18/2019 107* 0 - 99 mg/dL Final   ??? TSH 10/18/2019 0.693  0.450 - 4.500 uIU/mL Final   ??? Microalbumin urine (POC) 10/18/2019 100.0  MG/L Final   Office Visit on 10/12/2019   Component Date Value Ref Range Status   ??? C-Reactive Protein, Qt 10/12/2019 3  0 - 10 mg/L Final   ??? Sed rate (ESR) 10/12/2019 54* 0 - 40 mm/hr Final         The results above were reviewed and discussed with patient.         Assessment/Plan:   Sarah Chavez is a 79 y.o. female who presents with:     1. PMR (polymyalgia rheumatica) ([place]): Was instructed to remain on prednisone 4 mg once a day for now.  If she starts having recurrence of temporal headaches with jaw pain or double vision/blurring of vision she was instructed to call our office for increment in the dose of prednisone or go to the nearest ER.  Patient did verbalize understanding.  -     predniSONE (DELTASONE) 1 mg tablet; Take 4 pills of the prednisone after breakfast.  -     C REACTIVE PROTEIN, QT  -     SED RATE (ESR)    Disease activity plan:  As stated above.    Steroid management plan:  As stated above, if applicable.    Pain management plan:  As stated above, if applicable.    Weight management plan:  Weight loss through diet and exercise is always encouraged    Disease prognosis: Good        I appreciate the opportunity to continue to participate in the care of this patient.     Follow-up and Dispositions    ?? Return in about 11 weeks (around 03/13/2020).       Electronically signed by:  Jeffie Pollock, MD      This note was dictated using dragon voice recognition software.  It has been proofread, but there may still  exist voice recognition errors that the author did not detect.                --------------------------------------------------------------------------------------------------------------------------------------------------------------------------------------------------------------------------------

## 2019-12-28 ENCOUNTER — Encounter: Payer: MEDICARE | Attending: Rheumatology | Primary: Family Medicine

## 2019-12-28 LAB — C REACTIVE PROTEIN, QT: C-Reactive Protein, Qt: 36 mg/L — ABNORMAL HIGH (ref 0–10)

## 2019-12-28 LAB — SED RATE (ESR): Sed rate (ESR): 71 mm/hr — ABNORMAL HIGH (ref 0–40)

## 2019-12-28 LAB — C-REACTIVE PROTEIN: CRP: 36 mg/L — ABNORMAL HIGH (ref 0–10)

## 2019-12-28 LAB — SEDIMENTATION RATE: Sed Rate: 71 mm/hr — ABNORMAL HIGH (ref 0–40)

## 2019-12-31 MED ORDER — PRAVASTATIN 80 MG TAB
80 mg | ORAL_TABLET | ORAL | 3 refills | Status: DC
Start: 2019-12-31 — End: 2020-05-21

## 2019-12-31 MED ORDER — GLIPIZIDE SR 2.5 MG 24 HR TAB
2.5 mg | ORAL_TABLET | ORAL | 5 refills | Status: DC
Start: 2019-12-31 — End: 2020-01-12

## 2020-01-02 ENCOUNTER — Ambulatory Visit: Attending: Family Medicine | Primary: Family Medicine

## 2020-01-02 ENCOUNTER — Ambulatory Visit: Admit: 2020-01-02 | Discharge: 2020-01-02 | Payer: MEDICARE | Attending: Family Medicine | Primary: Family Medicine

## 2020-01-02 DIAGNOSIS — I10 Essential (primary) hypertension: Secondary | ICD-10-CM

## 2020-01-02 MED ORDER — METOPROLOL SUCCINATE SR 50 MG 24 HR TAB
50 mg | ORAL_TABLET | Freq: Every day | ORAL | 3 refills | Status: DC
Start: 2020-01-02 — End: 2020-05-22

## 2020-01-02 MED ORDER — DULOXETINE 60 MG CAP, DELAYED RELEASE
60 mg | ORAL_CAPSULE | Freq: Every day | ORAL | 3 refills | Status: AC
Start: 2020-01-02 — End: ?

## 2020-01-02 NOTE — Progress Notes (Signed)
SUBJECTIVE:   Sarah Chavez is a 79 y.o. female who has a past medical history significant for hypertension, diabetes, high cholesterol, polymyalgia rheumatica, overactive bladder, shingles and postherpetic neuralgia.  Patient continues to have postherpetic pain involving her right upper chest wall and upper back.  She is taking her Neurontin which helps she states but not completely.    In addition the patient reports ongoing struggles with lower extremity edema.  This edema is worse at the end of the day versus the beginning of the day.  She reports no associated chest pain, shortness of breath, orthopnea or PND.    Patient reports she recently has seen her rheumatologist and is starting to have her prednisone weaned.  She is on this for polymyalgia rheumatica.    HPI  See above    Past Medical History, Past Surgical History, Family history, Social History, and Medications were all reviewed with the patient today and updated as necessary.       Current Outpatient Medications   Medication Sig Dispense Refill   ??? metoprolol succinate (TOPROL-XL) 50 mg XL tablet Take 1 Tab by mouth daily. 90 Tab 3   ??? DULoxetine (CYMBALTA) 60 mg capsule Take 1 Cap by mouth daily. 90 Cap 3   ??? pravastatin (PRAVACHOL) 80 mg tablet TAKE 1 TABLET BY MOUTH DAILY 90 Tab 3   ??? glipiZIDE SR (GLUCOTROL XL) 2.5 mg CR tablet TAKE 1 TABLET BY MOUTH DAILY 30 Tab 5   ??? predniSONE (DELTASONE) 1 mg tablet Take 4 pills of the prednisone after breakfast. 120 Tab 0   ??? glucose blood VI test strips (OneTouch Ultra Blue Test Strip) strip Test glucose BID 100 Strip 5   ??? cholecalciferol (Vitamin D3) (2,000 UNITS /50 MCG) cap capsule TAKE 1 CAPSULE BY MOUTH EVERY DAY 30 Cap 5   ??? hydroCHLOROthiazide (HYDRODIURIL) 25 mg tablet Take 1 Tab by mouth daily. 30 Tab 5   ??? gabapentin (Neurontin) 300 mg capsule Take 1 Cap by mouth three (3) times daily. 90 Cap 1   ??? pantoprazole (PROTONIX) 40 mg tablet Take 1 Tab by mouth daily. 30 Tab 5   ??? lisinopriL  (PRINIVIL, ZESTRIL) 40 mg tablet Take 1 Tab by mouth daily. 90 Tab 3   ??? nitroglycerin (NITROSTAT) 0.4 mg SL tablet Place 1 sl under the tongue q 5 min prn cp, max 3 sl in a 15-min time period. Call 911 if no relief after the 3rd sl. 1 Bottle prn     Allergies   Allergen Reactions   ??? Ativan [Lorazepam] Other (comments)     Made her feel crazy and chest pressure   ??? Ativan [Lorazepam] Other (comments)     Chest fullness   ??? Other Medication Palpitations     Some type of decongestant   ??? Other Medication Other (comments)     Doesn't take decongestants, but can't recall why     Patient Active Problem List   Diagnosis Code   ??? HTN (hypertension) I10   ??? Diabetes mellitus type 2, controlled ([place]) E11.9   ??? Coronary artery disease involving native coronary artery of native heart without angina pectoris I25.10   ??? Dyslipidemia E78.5   ??? OSA on CPAP G47.33, Z99.89   ??? Severe obesity ([facility]) E66.01   ??? Elevated sed rate R70.0   ??? Normocytic anemia D64.9   ??? Vitamin D deficiency E55.9   ??? Polymyalgia rheumatica ([facility]) M35.3     Past Medical History:   Diagnosis  Date   ??? Abdominal pain 07/22/2015   ??? Anemia    ??? Back pain    ??? Bursitis of shoulder    ??? CAD (coronary artery disease)    ??? Cerumen impaction    ??? Chest pain, atypical 07/22/2015   ??? Diabetes ([facility])     no meds, A1c 09/28/17 6.6; avg bs 105; denies ss of hypo   ??? Diabetes mellitus out of control ([facility])    ??? Endocrine disease     thyroid   ??? Fatigue    ??? GERD (gastroesophageal reflux disease)     controlled with medicaitons   ??? Hip pain, right    ??? Hyperlipidemia, mild    ??? Hypertension     managed with medication   ??? Hypertension associated with diabetes ([facility]) 07/22/2015   ??? 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 Left carotid bruit    ??? Lumbago 07/22/2015   ??? Morbid obesity ([facility]) 07/22/2015    BMI 35.3   ??? OSA on CPAP 07/22/2015    uses CPAP   ??? Other ill-defined conditions(799.89)     cholesterol   ??? Other ill-defined conditions(799.89)     heart  cath 96   ??? Shoulder pain, acute    ??? Shoulder pain, bilateral    ??? Sinusitis, acute maxillary    ??? Unstable angina ([facility]) 07/06/2015   ??? Vitamin D deficiency    ??? Vitamin D deficiency 04/12/2019   ??? Weakness      Past Surgical History:   Procedure Laterality Date   ??? COLONOSCOPY N/A 03/01/2018    COLONOSCOPY/BMI 39 performed by Mazanec, Paul A, MD at [facility] ENDOSCOPY   ??? HX COLONOSCOPY     ??? HX HEART CATHETERIZATION  2016    without intervention   ??? HX HEENT      goiter/thyroid surgery   ??? HX THYROIDECTOMY       Family History   Problem Relation Age of Onset   ??? Stroke Sister    ??? Cancer Sister         cervical   ??? Stroke Brother    ??? Diabetes Other    ??? Hypertension Other      Social History     Tobacco Use   ??? Smoking status: Former Smoker     Packs/day: 0.50     Years: 10.00     Pack years: 5.00     Start date: 1961     Quit date: 1971     Years since quitting: 50.3   ??? Smokeless tobacco: Never Used   ??? Tobacco comment: quit in 1971   Substance Use Topics   ??? Alcohol use: No         Review of Systems  See above    OBJECTIVE:  Visit Vitals  BP (!) 146/38 (BP 1 Location: Right arm, BP Patient Position: Sitting, BP Cuff Size: Large adult)   Ht 4' 11" (1.499 m)   Wt 195 lb (88.5 kg)   BMI 39.39 kg/m??        Physical Exam  Constitutional:       Appearance: She is well-developed.   HENT:      Head: Normocephalic and atraumatic.   Eyes:      Pupils: Pupils are equal, round, and reactive to light.   Neck:      Musculoskeletal: Normal range of motion and neck supple.      Thyroid: No thyromegaly.        Vascular: No JVD.   Cardiovascular:      Rate and Rhythm: Normal rate and regular rhythm.      Heart sounds: Normal heart sounds. No murmur. No friction rub. No gallop.    Pulmonary:      Effort: Pulmonary effort is normal. No respiratory distress.      Breath sounds: No wheezing or rales.   Abdominal:      Palpations: Abdomen is soft.      Tenderness: There is no abdominal tenderness. There is no guarding or rebound.    Musculoskeletal: Normal range of motion.      Comments: Lower extremities reveal 1+ pitting edema up to the mid shins bilaterally.   Skin:     Findings: No rash.   Neurological:      Mental Status: She is alert and oriented to person, place, and time.         Medical problems and test results were reviewed with the patient today.         ASSESSMENT and PLAN    1.  Hypertension.  Blood pressure is 148/50.  Because of the ongoing struggles with edema we will discontinue her Norvasc.  We will change her to Toprol-XL 50 mg a day.  Continue HCTZ and Zestril.  Reevaluate in 3 to 4 weeks.    2.  Lower extremity edema.  Likely a combination of dependent edema and side effect from Norvasc.  Discontinue Norvasc.  Encourage elevation of lower extremities and use of support hose.  Reevaluate at follow-up.  Recent metabolic panel including renal function and electrolytes were normal.    3.  Postherpetic neuralgia.  Continue Neurontin.  We will add Cymbalta 60 mg a day.  Reevaluate at follow-up.    4.  Diabetes.  Most recent A1c was 6.8.  Continue current therapy.    5.  CAD.  Per cardiology.  No chest pain reported.    6.  Polymyalgia rheumatica.  Per rheumatology.  Prednisone is being weaned according to the patient.    Elements of this note have been dictated using speech recognition software. As a result, errors of speech recognition may have occurred.

## 2020-01-02 NOTE — Progress Notes (Signed)
SUBJECTIVE:   Sarah Chavez is a 79 y.o. female who has a past medical history significant for hypertension, diabetes, high cholesterol, polymyalgia rheumatica, overactive bladder, shingles and postherpetic neuralgia.  Patient continues to have postherpetic pain involving her right upper chest wall and upper back.  She is taking her Neurontin which helps she states but not completely.    In addition the patient reports ongoing struggles with lower extremity edema.  This edema is worse at the end of the day versus the beginning of the day.  She reports no associated chest pain, shortness of breath, orthopnea or PND.    Patient reports she recently has seen her rheumatologist and is starting to have her prednisone weaned.  She is on this for polymyalgia rheumatica.    HPI  See above    Past Medical History, Past Surgical History, Family history, Social History, and Medications were all reviewed with the patient today and updated as necessary.       Current Outpatient Medications   Medication Sig Dispense Refill   ??? metoprolol succinate (TOPROL-XL) 50 mg XL tablet Take 1 Tab by mouth daily. 90 Tab 3   ??? DULoxetine (CYMBALTA) 60 mg capsule Take 1 Cap by mouth daily. 90 Cap 3   ??? pravastatin (PRAVACHOL) 80 mg tablet TAKE 1 TABLET BY MOUTH DAILY 90 Tab 3   ??? glipiZIDE SR (GLUCOTROL XL) 2.5 mg CR tablet TAKE 1 TABLET BY MOUTH DAILY 30 Tab 5   ??? predniSONE (DELTASONE) 1 mg tablet Take 4 pills of the prednisone after breakfast. 120 Tab 0   ??? glucose blood VI test strips (OneTouch Ultra Blue Test Strip) strip Test glucose BID 100 Strip 5   ??? cholecalciferol (Vitamin D3) (2,000 UNITS /50 MCG) cap capsule TAKE 1 CAPSULE BY MOUTH EVERY DAY 30 Cap 5   ??? hydroCHLOROthiazide (HYDRODIURIL) 25 mg tablet Take 1 Tab by mouth daily. 30 Tab 5   ??? gabapentin (Neurontin) 300 mg capsule Take 1 Cap by mouth three (3) times daily. 90 Cap 1   ??? pantoprazole (PROTONIX) 40 mg tablet Take 1 Tab by mouth daily. 30 Tab 5   ??? lisinopriL  (PRINIVIL, ZESTRIL) 40 mg tablet Take 1 Tab by mouth daily. 90 Tab 3   ??? nitroglycerin (NITROSTAT) 0.4 mg SL tablet Place 1 sl under the tongue q 5 min prn cp, max 3 sl in a 15-min time period. Call 911 if no relief after the 3rd sl. 1 Bottle prn     Allergies   Allergen Reactions   ??? Ativan [Lorazepam] Other (comments)     Made her feel crazy and chest pressure   ??? Ativan [Lorazepam] Other (comments)     Chest fullness   ??? Other Medication Palpitations     Some type of decongestant   ??? Other Medication Other (comments)     Doesn't take decongestants, but can't recall why     Patient Active Problem List   Diagnosis Code   ??? HTN (hypertension) I10   ??? Diabetes mellitus type 2, controlled ([facility]) E11.9   ??? Coronary artery disease involving native coronary artery of native heart without angina pectoris I25.10   ??? Dyslipidemia E78.5   ??? OSA on CPAP G47.33, Z99.89   ??? Severe obesity (HCC) E66.01   ??? Elevated sed rate R70.0   ??? Normocytic anemia D64.9   ??? Vitamin D deficiency E55.9   ??? Polymyalgia rheumatica (HCC) M35.3     Past Medical History:   Diagnosis  Date   ??? Abdominal pain 07/22/2015   ??? Anemia    ??? Back pain    ??? Bursitis of shoulder    ??? CAD (coronary artery disease)    ??? Cerumen impaction    ??? Chest pain, atypical 07/22/2015   ??? Diabetes (HCC)     no meds, A1c 09/28/17 6.6; avg bs 105; denies ss of hypo   ??? Diabetes mellitus out of control (HCC)    ??? Endocrine disease     thyroid   ??? Fatigue    ??? GERD (gastroesophageal reflux disease)     controlled with medicaitons   ??? Hip pain, right    ??? Hyperlipidemia, mild    ??? Hypertension     managed with medication   ??? Hypertension associated with diabetes (HCC) 07/22/2015   ??? 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 Left carotid bruit    ??? Lumbago 07/22/2015   ??? Morbid obesity (HCC) 07/22/2015    BMI 35.3   ??? OSA on CPAP 07/22/2015    uses CPAP   ??? Other ill-defined conditions(799.89)     cholesterol   ??? Other ill-defined conditions(799.89)     heart  cath 96   ??? Shoulder pain, acute    ??? Shoulder pain, bilateral    ??? Sinusitis, acute maxillary    ??? Unstable angina (HCC) 07/06/2015   ??? Vitamin D deficiency    ??? Vitamin D deficiency 04/12/2019   ??? Weakness      Past Surgical History:   Procedure Laterality Date   ??? COLONOSCOPY N/A 03/01/2018    COLONOSCOPY/BMI 39 performed by Mazanec, Worthy Rancher, MD at [facility] ENDOSCOPY   ??? HX COLONOSCOPY     ??? HX HEART CATHETERIZATION  2016    without intervention   ??? HX HEENT      goiter/thyroid surgery   ??? HX THYROIDECTOMY       Family History   Problem Relation Age of Onset   ??? Stroke Sister    ??? Cancer Sister         cervical   ??? Stroke Brother    ??? Diabetes Other    ??? Hypertension Other      Social History     Tobacco Use   ??? Smoking status: Former Smoker     Packs/day: 0.50     Years: 10.00     Pack years: 5.00     Start date: 1961     Quit date: 1971     Years since quitting: 50.3   ??? Smokeless tobacco: Never Used   ??? Tobacco comment: quit in 1971   Substance Use Topics   ??? Alcohol use: No         Review of Systems  See above    OBJECTIVE:  Visit Vitals  BP (!) 146/38 (BP 1 Location: Right arm, BP Patient Position: Sitting, BP Cuff Size: Large adult)   Ht 4\' 11"  (1.499 m)   Wt 195 lb (88.5 kg)   BMI 39.39 kg/m??        Physical Exam  Constitutional:       Appearance: She is well-developed.   HENT:      Head: Normocephalic and atraumatic.   Eyes:      Pupils: Pupils are equal, round, and reactive to light.   Neck:      Musculoskeletal: Normal range of motion and neck supple.      Thyroid: No thyromegaly.  Vascular: No JVD.   Cardiovascular:      Rate and Rhythm: Normal rate and regular rhythm.      Heart sounds: Normal heart sounds. No murmur. No friction rub. No gallop.    Pulmonary:      Effort: Pulmonary effort is normal. No respiratory distress.      Breath sounds: No wheezing or rales.   Abdominal:      Palpations: Abdomen is soft.      Tenderness: There is no abdominal tenderness. There is no guarding or rebound.    Musculoskeletal: Normal range of motion.      Comments: Lower extremities reveal 1+ pitting edema up to the mid shins bilaterally.   Skin:     Findings: No rash.   Neurological:      Mental Status: She is alert and oriented to person, place, and time.         Medical problems and test results were reviewed with the patient today.         ASSESSMENT and PLAN    1.  Hypertension.  Blood pressure is 148/50.  Because of the ongoing struggles with edema we will discontinue her Norvasc.  We will change her to Toprol-XL 50 mg a day.  Continue HCTZ and Zestril.  Reevaluate in 3 to 4 weeks.    2.  Lower extremity edema.  Likely a combination of dependent edema and side effect from Norvasc.  Discontinue Norvasc.  Encourage elevation of lower extremities and use of support hose.  Reevaluate at follow-up.  Recent metabolic panel including renal function and electrolytes were normal.    3.  Postherpetic neuralgia.  Continue Neurontin.  We will add Cymbalta 60 mg a day.  Reevaluate at follow-up.    4.  Diabetes.  Most recent A1c was 6.8.  Continue current therapy.    5.  CAD.  Per cardiology.  No chest pain reported.    6.  Polymyalgia rheumatica.  Per rheumatology.  Prednisone is being weaned according to the patient.    Elements of this note have been dictated using speech recognition software. As a result, errors of speech recognition may have occurred.

## 2020-01-09 ENCOUNTER — Ambulatory Visit: Attending: Cardiovascular Disease | Primary: Family Medicine

## 2020-01-09 ENCOUNTER — Ambulatory Visit
Admit: 2020-01-09 | Discharge: 2020-01-09 | Payer: MEDICARE | Attending: Cardiovascular Disease | Primary: Family Medicine

## 2020-01-09 ENCOUNTER — Other Ambulatory Visit: Admit: 2020-01-09 | Discharge: 2020-01-09 | Payer: MEDICARE | Primary: Family Medicine

## 2020-01-09 DIAGNOSIS — I2510 Atherosclerotic heart disease of native coronary artery without angina pectoris: Secondary | ICD-10-CM

## 2020-01-09 MED ORDER — NITROGLYCERIN 0.4 MG SUBLINGUAL TAB
0.4 mg | SUBLINGUAL | 11 refills | Status: AC
Start: 2020-01-09 — End: ?

## 2020-01-09 NOTE — Progress Notes (Signed)
[address], [address]  [place] [address]  PHONE: [phone]    Sarah Chavez  09-27-1940      SUBJECTIVE:   Sarah Chavez is a 79 y.o. female seen for a follow up visit regarding the following:     Chief Complaint   Patient presents with   ??? Coronary Artery Disease   ??? Hypertension   ??? Follow-up     seen in office 2019       HPI:    79 year old female comes back for follow-up after 2 years.  She had a history of mild coronary disease and an echo showing hyperdynamic function in the past with mild LVH.  She had hypertension for some time now but had some swelling of her legs and her family doctor took her off the Norvasc.  She is on hydrochlorothiazide.  She is pretty much in a wheelchair right now is not able to walk well she says.  We saw her last thought she might have polymyalgia rheumatica and because her sed rate was over 100 CRP was over 30.  She turned out to have that diagnosis by rheumatology has been on steroids even though her sed rates are still running high.  She also had anemia last hemoglobin I can find was 8.9.  She stays tired all the time and some shortness of breath.  No specific chest pain.      Past Medical History, Past Surgical History, Family history, Social History, and Medications were all reviewed with the patient today and updated as necessary.     Outpatient Medications Marked as Taking for the 01/09/20 encounter (Office Visit) with Herbert Spires, MD   Medication Sig Dispense Refill   ??? nitroglycerin (Nitrostat) 0.4 mg SL tablet Place 1 sl under the tongue q 5 min prn cp, max 3 sl in a 15-min time period. Call 911 if no relief after the 3rd sl. 1 Bottle 11   ??? metoprolol succinate (TOPROL-XL) 50 mg XL tablet Take 1 Tab by mouth daily. 90 Tab 3   ??? DULoxetine (CYMBALTA) 60 mg capsule Take 1 Cap by mouth daily. 90 Cap 3   ??? pravastatin (PRAVACHOL) 80 mg tablet TAKE 1 TABLET BY MOUTH DAILY 90 Tab 3   ??? glipiZIDE SR (GLUCOTROL XL) 2.5 mg CR tablet TAKE 1 TABLET  BY MOUTH DAILY 30 Tab 5   ??? predniSONE (DELTASONE) 1 mg tablet Take 4 pills of the prednisone after breakfast. 120 Tab 0   ??? glucose blood VI test strips (OneTouch Ultra Blue Test Strip) strip Test glucose BID 100 Strip 5   ??? cholecalciferol (Vitamin D3) (2,000 UNITS /50 MCG) cap capsule TAKE 1 CAPSULE BY MOUTH EVERY DAY 30 Cap 5   ??? hydroCHLOROthiazide (HYDRODIURIL) 25 mg tablet Take 1 Tab by mouth daily. 30 Tab 5   ??? gabapentin (Neurontin) 300 mg capsule Take 1 Cap by mouth three (3) times daily. 90 Cap 1   ??? pantoprazole (PROTONIX) 40 mg tablet Take 1 Tab by mouth daily. 30 Tab 5   ??? lisinopriL (PRINIVIL, ZESTRIL) 40 mg tablet Take 1 Tab by mouth daily. 90 Tab 3     Allergies   Allergen Reactions   ??? Ativan [Lorazepam] Other (comments)     Made her feel crazy and chest pressure   ??? Ativan [Lorazepam] Other (comments)     Chest fullness   ??? Other Medication Palpitations     Some type of decongestant   ??? Other Medication  Other (comments)     Doesn't take decongestants, but can't recall why     Past Medical History:   Diagnosis Date   ??? Abdominal pain 07/22/2015   ??? Anemia    ??? Back pain    ??? Bursitis of shoulder    ??? CAD (coronary artery disease)    ??? Cerumen impaction    ??? Chest pain, atypical 07/22/2015   ??? Diabetes (HCC)     no meds, A1c 09/28/17 6.6; avg bs 105; denies ss of hypo   ??? Diabetes mellitus out of control (HCC)    ??? Endocrine disease     thyroid   ??? Fatigue    ??? GERD (gastroesophageal reflux disease)     controlled with medicaitons   ??? Hip pain, right    ??? Hyperlipidemia, mild    ??? Hypertension     managed with medication   ??? Hypertension associated with diabetes (HCC) 07/22/2015   ??? 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 Left carotid bruit    ??? Lumbago 07/22/2015   ??? Morbid obesity (HCC) 07/22/2015    BMI 35.3   ??? OSA on CPAP 07/22/2015    uses CPAP   ??? Other ill-defined conditions(799.89)     cholesterol   ??? Other ill-defined conditions(799.89)     heart cath 96   ??? Shoulder  pain, acute    ??? Shoulder pain, bilateral    ??? Sinusitis, acute maxillary    ??? Unstable angina (HCC) 07/06/2015   ??? Vitamin D deficiency    ??? Vitamin D deficiency 04/12/2019   ??? Weakness      Past Surgical History:   Procedure Laterality Date   ??? COLONOSCOPY N/A 03/01/2018    COLONOSCOPY/BMI 39 performed by Mazanec, Worthy Rancher, MD at [facility] ENDOSCOPY   ??? HX COLONOSCOPY     ??? HX HEART CATHETERIZATION  2016    without intervention   ??? HX HEENT      goiter/thyroid surgery   ??? HX THYROIDECTOMY       Family History   Problem Relation Age of Onset   ??? Stroke Sister    ??? Cancer Sister         cervical   ??? Stroke Brother    ??? Diabetes Other    ??? Hypertension Other       Social History     Tobacco Use   ??? Smoking status: Former Smoker     Packs/day: 0.50     Years: 10.00     Pack years: 5.00     Start date: 1961     Quit date: 1971     Years since quitting: 50.3   ??? Smokeless tobacco: Never Used   ??? Tobacco comment: quit in 1971   Substance Use Topics   ??? Alcohol use: No       ROS:    Review of Systems   Constitution: Negative for decreased appetite and weight loss.   Cardiovascular: Positive for irregular heartbeat and leg swelling. Negative for chest pain and dyspnea on exertion.   Respiratory: Negative for shortness of breath.    Musculoskeletal: Positive for arthritis, back pain and myalgias.   Gastrointestinal: Negative for abdominal pain.           PHYSICAL EXAM:    Visit Vitals  BP 136/64   Pulse (!) 55   Ht 4\' 11"  (1.499 m)   Wt 189 lb 12.8 oz (86.1 kg)   BMI 38.33  kg/m??          Wt Readings from Last 3 Encounters:   01/09/20 189 lb 12.8 oz (86.1 kg)   01/02/20 195 lb (88.5 kg)   12/27/19 194 lb (88 kg)     BP Readings from Last 3 Encounters:   01/09/20 136/64   01/02/20 (!) 146/38   12/27/19 (!) 156/61         Physical Exam   Constitutional: No distress.    Patient is in a wheelchair no acute distress   Cardiovascular: Normal rate.   Murmur heard.   Systolic murmur is present with a grade of 2/6.  Pulmonary/Chest: Effort  normal. She has no wheezes. She has no rales.   Abdominal: Soft.   Musculoskeletal:         General: Edema (1+) present.   Neurological: She is alert.   Skin: Skin is warm.   Psychiatric: She has a normal mood and affect.       Medical problems and test results were reviewed with the patient today.     Results for orders placed or performed in visit on 01/09/20   AMB POC EKG ROUTINE W/ 12 LEADS, INTER & REP    Impression    Sinus bradycardia or rate of 56. sinus pause.  First-degree AV block.  No significant ST changes     Lab Results   Component Value Date/Time    Sodium 142 10/18/2019 11:49 AM    Potassium 4.4 10/18/2019 11:49 AM    Chloride 104 10/18/2019 11:49 AM    CO2 22 10/18/2019 11:49 AM    Anion gap 7 06/13/2019 10:53 AM    Glucose 122 (H) 10/18/2019 11:49 AM    BUN 24 10/18/2019 11:49 AM    Creatinine 0.92 10/18/2019 11:49 AM    BUN/Creatinine ratio 26 10/18/2019 11:49 AM    GFR est AA 68 10/18/2019 11:49 AM    GFR est non-AA 59 (L) 10/18/2019 11:49 AM    Calcium 9.2 10/18/2019 11:49 AM     Lab Results   Component Value Date/Time    Cholesterol, total 209 (H) 10/18/2019 11:49 AM    HDL Cholesterol 92 10/18/2019 11:49 AM    LDL, calculated 107 (H) 10/18/2019 11:49 AM    LDL, calculated 83 03/22/2019 10:31 AM    VLDL, calculated 10 10/18/2019 11:49 AM    VLDL, calculated 13 03/22/2019 10:31 AM    Triglyceride 53 10/18/2019 11:49 AM    CHOL/HDL Ratio 2.5 07/07/2015 05:15 AM         ASSESSMENT and PLAN    Diagnoses and all orders for this visit:    1. Coronary artery disease involving native coronary artery of native heart without angina pectoris patient's not having active angina that I can tell no acute ST segment changes noted.  -     AMB POC EKG ROUTINE W/ 12 LEADS, INTER & REP  -     NT-PRO BNP; Future  -     ECHO COMPLETE STUDY; Future    2. Normocytic anemia she had anemia and her hemoglobin is been lower than 9 I had seen that repeated a while we will check that again also check her thyroid function   -     AMB POC EKG ROUTINE W/ 12 LEADS, INTER & REP  -     CBC WITH AUTOMATED DIFF; Future  -     TSH 3RD GENERATION; Future    3. Polymyalgia rheumatica (HCC) she has diagnosis of  polymyalgia myalgia rheumatica with continued symptoms she also has very high sed rate he is a follow-up rheumatology  -     AMB POC EKG ROUTINE W/ 12 LEADS, INTER & REP    4. Severe obesity (HCC) she is gaining weight has obesity and really not able to do any activity at this time  -     AMB POC EKG ROUTINE W/ 12 LEADS, INTER & REP    5. Dyslipidemia she is been on cholesterol medications and was stable  -     AMB POC EKG ROUTINE W/ 12 LEADS, INTER & REP    6. Murmur patient has a murmur previously she had some mild aortic thickening we will check the echo again also look at LV function check a proBNP  -     AMB POC EKG ROUTINE W/ 12 LEADS, INTER & REP  -     ECHO COMPLETE STUDY; Future    7. Localized edema she has had some mild edema that certainly could be from Norvasc she is on hydrochlorothiazide now which is will help her blood pressure.  We will check a proBNP  -     NT-PRO BNP; Future  -     ECHO COMPLETE STUDY; Future    Other orders  -     nitroglycerin (Nitrostat) 0.4 mg SL tablet; Place 1 sl under the tongue q 5 min prn cp, max 3 sl in a 15-min time period. Call 911 if no relief after the 3rd sl.               Follow-up and Dispositions    ?? Return in about 1 month (around 02/09/2020).         Maree Krabbe, MD  01/09/2020  10:31 AM

## 2020-01-09 NOTE — Progress Notes (Signed)
Sarah Chavez is a 79 y.o. female had labs drawn today for labs per provider at [address] [place] [address].  The patient was drawn at  1055am. In the   left arm and the patient tolerated the procedure fine with no issues.       Naava Janeway L Maritsa Hunsucker

## 2020-01-09 NOTE — Progress Notes (Signed)
Sarah Chavez is a 79 y.o. female had labs drawn today for labs per provider at [address] [place] [address].  The patient was drawn at  1055am. In the   left arm and the patient tolerated the procedure fine with no issues.       Shary Key

## 2020-01-09 NOTE — Progress Notes (Signed)
Progress  Notes by Maree Krabbe, MD at 01/09/20 0915                Author: Maree Krabbe, MD  Service: --  Author Type: Physician       Filed: 01/09/20 1036  Encounter Date: 01/09/2020  Status: Signed          Editor: Maree Krabbe, MD (Physician)                    [address], [address]   [place], [place] [address]   PHONE: [phone]      Sarah Chavez   03-Dec-1940         SUBJECTIVE:    Sarah Chavez is a 79 y.o.  female seen for a follow up visit regarding the following:         Chief Complaint       Patient presents with        ?  Coronary Artery Disease     ?  Hypertension     ?  Follow-up             seen in office 2019           HPI:      79 year old female comes back for follow-up after 2 years.  She had a history of mild coronary disease and an echo  showing hyperdynamic function in the past with mild LVH.  She had hypertension for some time now but had some swelling of her legs and her family doctor took her off the Norvasc.  She is on hydrochlorothiazide.  She is pretty much in a wheelchair right  now is not able to walk well she says.  We saw her last thought she might have polymyalgia rheumatica and because her sed rate was over 100 CRP was over 30.  She turned out to have that diagnosis by rheumatology has been on steroids even though her sed  rates are still running high.  She also had anemia last hemoglobin I can find was 8.9.  She stays tired all the time and some shortness of breath.  No specific chest pain.         Past Medical History, Past Surgical History, Family history, Social History, and Medications were all reviewed with the patient today and updated as necessary.         Outpatient Medications Marked as Taking for the 01/09/20 encounter (Office Visit) with Maree Krabbe, MD          Medication  Sig  Dispense  Refill           ?  nitroglycerin (Nitrostat) 0.4 mg SL tablet  Place 1 sl under the tongue q 5 min prn cp, max 3 sl in a 15-min time period. Call  911 if no relief after the 3rd sl.  1 Bottle  11     ?  metoprolol succinate (TOPROL-XL) 50 mg XL tablet  Take 1 Tab by mouth daily.  90 Tab  3     ?  DULoxetine (CYMBALTA) 60 mg capsule  Take 1 Cap by mouth daily.  90 Cap  3     ?  pravastatin (PRAVACHOL) 80 mg tablet  TAKE 1 TABLET BY MOUTH DAILY  90 Tab  3     ?  glipiZIDE SR (GLUCOTROL XL) 2.5 mg CR tablet  TAKE 1 TABLET BY MOUTH DAILY  30 Tab  5     ?  predniSONE (DELTASONE) 1 mg tablet  Take 4 pills of the prednisone after breakfast.  120 Tab  0     ?  glucose blood VI test strips (OneTouch Ultra Blue Test Strip) strip  Test glucose BID  100 Strip  5     ?  cholecalciferol (Vitamin D3) (2,000 UNITS /50 MCG) cap capsule  TAKE 1 CAPSULE BY MOUTH EVERY DAY  30 Cap  5     ?  hydroCHLOROthiazide (HYDRODIURIL) 25 mg tablet  Take 1 Tab by mouth daily.  30 Tab  5     ?  gabapentin (Neurontin) 300 mg capsule  Take 1 Cap by mouth three (3) times daily.  90 Cap  1     ?  pantoprazole (PROTONIX) 40 mg tablet  Take 1 Tab by mouth daily.  30 Tab  5           ?  lisinopriL (PRINIVIL, ZESTRIL) 40 mg tablet  Take 1 Tab by mouth daily.  90 Tab  3          Allergies        Allergen  Reactions         ?  Ativan [Lorazepam]  Other (comments)             Made her feel crazy and chest pressure         ?  Ativan [Lorazepam]  Other (comments)             Chest fullness         ?  Other Medication  Palpitations             Some type of decongestant         ?  Other Medication  Other (comments)             Doesn't take decongestants, but can't recall why          Past Medical History:        Diagnosis  Date         ?  Abdominal pain  07/22/2015     ?  Anemia       ?  Back pain       ?  Bursitis of shoulder       ?  CAD (coronary artery disease)       ?  Cerumen impaction       ?  Chest pain, atypical  07/22/2015     ?  Diabetes (HCC)            no meds, A1c 09/28/17 6.6; avg bs 105; denies ss of hypo         ?  Diabetes mellitus out of control [facility])       ?  Endocrine disease             thyroid         ?  Fatigue       ?  GERD (gastroesophageal reflux disease)            controlled with medicaitons         ?  Hip pain, right       ?  Hyperlipidemia, mild       ?  Hypertension            managed with medication         ?  Hypertension associated with diabetes (HCC)  07/22/2015     ?  Hypertensive pulmonary vascular  disease (HCC)  07/22/2015     ?  Left carotid bruit       ?  Lumbago  07/22/2015     ?  Morbid obesity (HCC)  07/22/2015          BMI 35.3         ?  OSA on CPAP  07/22/2015          uses CPAP         ?  Other ill-defined conditions(799.89)            cholesterol         ?  Other ill-defined conditions(799.89)            heart cath 96         ?  Shoulder pain, acute       ?  Shoulder pain, bilateral       ?  Sinusitis, acute maxillary       ?  Unstable angina (HCC)  07/06/2015     ?  Vitamin D deficiency       ?  Vitamin D deficiency  04/12/2019         ?  Weakness            Past Surgical History:         Procedure  Laterality  Date          ?  COLONOSCOPY  N/A  03/01/2018          COLONOSCOPY/BMI 39 performed by Mazanec, Worthy Rancher, MD at [facility] END[facility]COPY          ?  HX COLONOSCOPY         ?  HX HEART CATHETERIZATION    2016          without intervention          ?  HX HEENT              goiter/thyroid surgery          ?  HX THYROIDECTOMY              Family History         Problem  Relation  Age of Onset          ?  Stroke  Sister       ?  Cancer  Sister                cervical          ?  Stroke  Brother       ?  Diabetes  Other            ?  Hypertension  Other             Social History          Tobacco Use         ?  Smoking status:  Former Smoker              Packs/day:  0.50         Years:  10.00         Pack years:  5.00         Start date:  1961         Quit date:  1971         Years since quitting:  50.3         ?  Smokeless tobacco:  Never Used        ?  Tobacco comment: quit in 1971       Substance Use Topics         ?  Alcohol use:  No           ROS:      Review of Systems     Constitution: Negative for decreased appetite and weight loss.    Cardiovascular: Positive for irregular heartbeat and leg swelling . Negative for chest pain and dyspnea on exertion.    Respiratory: Negative for shortness of breath.     Musculoskeletal: Positive for arthritis, back pain  and myalgias.    Gastrointestinal: Negative for abdominal pain.               PHYSICAL EXAM:      Visit Vitals      BP  136/64     Pulse  (!) 55     Ht  4\' 11"  (1.499 m)     Wt  189 lb 12.8 oz (86.1 kg)        BMI  38.33 kg/m??                 Wt Readings from Last 3 Encounters:        01/09/20  189 lb 12.8 oz (86.1 kg)     01/02/20  195 lb (88.5 kg)        12/27/19  194 lb (88 kg)          BP Readings from Last 3 Encounters:        01/09/20  136/64     01/02/20  (!) 146/38        12/27/19  (!) 156/61              Physical Exam    Constitutional: No distress.    Patient is in a wheelchair no acute distress    Cardiovascular: Normal rate.    Murmur heard.    Systolic murmur is present with a grade of 2 /6.   Pulmonary/Chest: Effort normal. She has no wheezes. She has no rales.    Abdominal: Soft.    Musculoskeletal:          General: Edema (1+)  present.   Neurological: She is alert.    Skin: Skin is warm.   Psychiatric: She has a normal mood and affect.          Medical problems and test results were reviewed with the patient today.         Results for orders placed or performed in visit on 01/09/20     AMB POC EKG ROUTINE W/ 12 LEADS, INTER & REP          Impression          Sinus bradycardia or rate of 56. sinus pause.  First-degree AV block.  No significant ST changes          Lab Results         Component  Value  Date/Time            Sodium  142  10/18/2019 11:49 AM       Potassium  4.4  10/18/2019 11:49 AM       Chloride  104  10/18/2019 11:49 AM       CO2  22  10/18/2019 11:49 AM       Anion gap  7  06/13/2019 10:53 AM       Glucose  122 (H)  10/18/2019 11:49 AM  BUN  24  10/18/2019 11:49 AM       Creatinine  0.92   10/18/2019 11:49 AM       BUN/Creatinine ratio  26  10/18/2019 11:49 AM       GFR est AA  68  10/18/2019 11:49 AM       GFR est non-AA  59 (L)  10/18/2019 11:49 AM            Calcium  9.2  10/18/2019 11:49 AM          Lab Results         Component  Value  Date/Time            Cholesterol, total  209 (H)  10/18/2019 11:49 AM       HDL Cholesterol  92  10/18/2019 11:49 AM       LDL, calculated  107 (H)  10/18/2019 11:49 AM       LDL, calculated  83  03/22/2019 10:31 AM       VLDL, calculated  10  10/18/2019 11:49 AM       VLDL, calculated  13  03/22/2019 10:31 AM       Triglyceride  53  10/18/2019 11:49 AM            CHOL/HDL Ratio  2.5  07/07/2015 05:15 AM              ASSESSMENT and PLAN      Diagnoses and all orders for this visit:      1. Coronary artery disease involving native coronary artery of native heart without angina pectoris patient's not having active angina that I can tell no acute ST segment changes noted.   -     AMB POC EKG ROUTINE W/ 12 LEADS, INTER & REP   -     NT-PRO BNP; Future   -     ECHO COMPLETE STUDY; Future      2. Normocytic anemia she had anemia and her hemoglobin is been lower than 9 I had seen that repeated a while we will check that again also check her thyroid function   -     AMB POC EKG ROUTINE W/ 12 LEADS, INTER & REP   -     CBC WITH AUTOMATED DIFF; Future   -     TSH 3RD GENERATION; Future      3. Polymyalgia rheumatica (HCC) she has diagnosis of polymyalgia myalgia rheumatica with continued symptoms she also has very high sed rate he is a follow-up rheumatology   -     AMB POC EKG ROUTINE W/ 12 LEADS, INTER & REP      4. Severe obesity ([place]) she is gaining weight has obesity and really not able to do any activity at this time   -     AMB POC EKG ROUTINE W/ 12 LEADS, INTER & REP      5. Dyslipidemia she is been on cholesterol medications and was stable   -     AMB POC EKG ROUTINE W/ 12 LEADS, INTER & REP      6. Murmur patient has a murmur previously she had some mild aortic  thickening we will check the echo again also look at LV function check a proBNP   -     AMB POC EKG ROUTINE W/ 12 LEADS, INTER & REP   -     ECHO COMPLETE STUDY; Future      7. Localized edema  she has had some mild edema that certainly could be from Norvasc she is on hydrochlorothiazide now which is will help her blood pressure.  We will check a proBNP   -     NT-PRO BNP; Future   -     ECHO COMPLETE STUDY; Future      Other orders   -     nitroglycerin (Nitrostat) 0.4 mg SL tablet; Place 1 sl under the tongue q 5 min prn cp, max 3 sl in a 15-min time period. Call 911 if no relief after the 3rd sl.                        Follow-up and Dispositions      ??  Return in about 1 month (around 02/09/2020).                Maree Krabbe, MD   01/09/2020   10:31 AM

## 2020-01-10 LAB — CBC WITH AUTOMATED DIFF
ABS. BASOPHILS: 0 10*3/uL (ref 0.0–0.2)
ABS. EOSINOPHILS: 0.2 10*3/uL (ref 0.0–0.4)
ABS. IMM. GRANS.: 0 10*3/uL (ref 0.0–0.1)
ABS. MONOCYTES: 1 10*3/uL — ABNORMAL HIGH (ref 0.1–0.9)
ABS. NEUTROPHILS: 4.6 10*3/uL (ref 1.4–7.0)
Abs Lymphocytes: 1.6 10*3/uL (ref 0.7–3.1)
BASOPHILS: 1 %
EOSINOPHILS: 2 %
HCT: 31.2 % — ABNORMAL LOW (ref 34.0–46.6)
HGB: 10.2 g/dL — ABNORMAL LOW (ref 11.1–15.9)
IMMATURE GRANULOCYTES: 0 %
Lymphocytes: 22 %
MCH: 28.4 pg (ref 26.6–33.0)
MCHC: 32.7 g/dL (ref 31.5–35.7)
MCV: 87 fL (ref 79–97)
MONOCYTES: 13 %
NEUTROPHILS: 62 %
PLATELET: 219 10*3/uL (ref 150–450)
RBC: 3.59 x10E6/uL — ABNORMAL LOW (ref 3.77–5.28)
RDW: 13.1 % (ref 11.7–15.4)
WBC: 7.4 10*3/uL (ref 3.4–10.8)

## 2020-01-10 LAB — NT-PRO BNP: PROBNP: 1447 pg/mL — ABNORMAL HIGH (ref 0–738)

## 2020-01-10 LAB — TSH 3RD GENERATION
TSH: 1.08 u[IU]/mL (ref 0.450–4.500)
TSH: 1.08 u[IU]/mL (ref 0.450–4.500)

## 2020-01-10 LAB — CBC WITH AUTO DIFFERENTIAL
Basophils %: 1 %
Basophils Absolute: 0 10*3/uL (ref 0.0–0.2)
Eosinophils %: 2 %
Eosinophils Absolute: 0.2 10*3/uL (ref 0.0–0.4)
Granulocyte Absolute Count: 0 10*3/uL (ref 0.0–0.1)
Hematocrit: 31.2 % — ABNORMAL LOW (ref 34.0–46.6)
Hemoglobin: 10.2 g/dL — ABNORMAL LOW (ref 11.1–15.9)
Immature Granulocytes: 0 %
Lymphocytes %: 22 %
Lymphocytes Absolute: 1.6 10*3/uL (ref 0.7–3.1)
MCH: 28.4 pg (ref 26.6–33.0)
MCHC: 32.7 g/dL (ref 31.5–35.7)
MCV: 87 fL (ref 79–97)
Monocytes %: 13 %
Monocytes Absolute: 1 10*3/uL — ABNORMAL HIGH (ref 0.1–0.9)
Neutrophils %: 62 %
Neutrophils Absolute: 4.6 10*3/uL (ref 1.4–7.0)
Platelets: 219 10*3/uL (ref 150–450)
RBC: 3.59 x10E6/uL — ABNORMAL LOW (ref 3.77–5.28)
RDW: 13.1 % (ref 11.7–15.4)
WBC: 7.4 10*3/uL (ref 3.4–10.8)

## 2020-01-10 LAB — PROBNP, N-TERMINAL: Pro-BNP: 1447 pg/mL — ABNORMAL HIGH (ref 0–738)

## 2020-01-12 MED ORDER — GLIPIZIDE SR 2.5 MG 24 HR TAB
2.5 mg | ORAL_TABLET | ORAL | 5 refills | Status: DC
Start: 2020-01-12 — End: 2020-08-05

## 2020-01-12 NOTE — Telephone Encounter (Signed)
Please send glipizide er to [facility] on [address]

## 2020-01-15 MED ORDER — GABAPENTIN 300 MG CAP
300 mg | ORAL_CAPSULE | ORAL | 1 refills | Status: AC
Start: 2020-01-15 — End: ?

## 2020-01-16 ENCOUNTER — Ambulatory Visit: Attending: Orthopaedic Surgery | Primary: Family Medicine

## 2020-01-16 ENCOUNTER — Inpatient Hospital Stay: Admit: 2020-01-16 | Payer: MEDICARE | Primary: Family Medicine

## 2020-01-16 ENCOUNTER — Ambulatory Visit: Admit: 2020-01-16 | Discharge: 2020-01-16 | Payer: MEDICARE | Attending: Orthopaedic Surgery | Primary: Family Medicine

## 2020-01-16 ENCOUNTER — Encounter: Admit: 2020-01-16 | Payer: MEDICARE | Primary: Family Medicine

## 2020-01-16 DIAGNOSIS — M19011 Primary osteoarthritis, right shoulder: Secondary | ICD-10-CM

## 2020-01-16 DIAGNOSIS — M25511 Pain in right shoulder: Secondary | ICD-10-CM

## 2020-01-16 MED ORDER — METHYLPREDNISOLONE 80 MG/ML SUSP FOR INJECTION
80 mg/mL | Freq: Once | INTRAMUSCULAR | Status: AC
Start: 2020-01-16 — End: 2020-01-16
  Administered 2020-01-16: 16:00:00 via INTRAMUSCULAR

## 2020-01-16 MED ORDER — LIDOCAINE HCL 2 % (20 MG/ML) IJ SOLN
20 mg/mL (2 %) | Freq: Once | INTRAMUSCULAR | Status: AC
Start: 2020-01-16 — End: 2020-01-16
  Administered 2020-01-16: 16:00:00 via INTRADERMAL

## 2020-01-16 MED ORDER — BUPIVACAINE 0.5 % (5 MG/ML) IJ SOLN
0.5 % (5 mg/mL) | Freq: Once | INTRAMUSCULAR | Status: AC
Start: 2020-01-16 — End: 2020-01-16
  Administered 2020-01-16: 16:00:00 via SUBCUTANEOUS

## 2020-01-16 NOTE — Progress Notes (Addendum)
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  Difficulty lifting arms and using hands for self care, reaching and pushing and difficulty keeping head up straight and turning neck.  Pain: Initial:   8-9/10 with motion Post Session:  No VAS.  No increase in pain reported.    Medications Last Reviewed:  01/16/20    Updated Objective Findings:   See evaluation note from today     TREATMENT:   Therapeutic Exercises: (5 min)  Assigned and practiced the following HEP:  -- instructed pt in sitting with proper pillow support and leaning back into pillows instead of stooping forward for improved posture  -- with posture above- cervical retraction 10x  -- with posture above- scapula squeeze 10x with multiple cues for correct form  -- sitting shoulder ER yellow band one side at at time 10x ea side - to work up to 10x3 for HEP.  HEP: as above, pt given hand out.   MedBridge Portal    Treatment/Session Summary:    ??  Response to Treatment:  pt demonstrated understanding of exercises but will need review. Would likely do well with exercises in supine as well for shoulder ROM. Marland Kitchen  ?? Communication/Consultation:  None today  ?? Equipment provided today:  yellow theraband.  ?? Recommendations/Intent for next treatment session: Next visit will focus on ROM or AIS for BIL shoulder and neck.  Trial of manual traction for neck. Progress HEP. Pt only able to come 1x per week since she must be brought here by her daughter.     Total Treatment Billable Duration:  eval only   PT Patient Time In/Time Out  Time In: 1410  Time Out: 1510    Gwendolyn Lima, PT    Future Appointments   Date Time Provider Department Center   01/23/2020  1:00 PM Helyn Numbers, [place] [facility]   01/30/2020  1:15 PM Helyn Numbers, PT [facility]   02/06/2020 11:10 AM Delana Meyer, MD [facility]   02/06/2020  1:00 PM Gwendolyn Lima, PT [facility]   02/13/2020  9:30 AM ECHO 21 GVL [facility]   02/13/2020  2:45 PM Gwendolyn Lima, PT [facility]   02/20/2020  1:00 PM Gwendolyn Lima, PT [facility]   02/27/2020  8:45 AM Maree Krabbe, MD [facility]   03/05/2020 10:15 AM Posta, Hinda Glatter., MD [facility] COC   03/13/2020  9:40 AM Susy Manor, MD [facility]

## 2020-01-16 NOTE — Progress Notes (Signed)
Name: Sarah Chavez  Date of Birth: 1941-01-23  Gender: female  MRN: [id number]      What: Right shoulder pain  How: Long history      Referring provider: Dr. Ulice Dash    HPI: Sarah Chavez is a 79 y.o. right-hand-dominant female seen at the request of Dr. Sherryll Burger for problems later to her right shoulder.  She has history of hypertension diabetes mellitus hypercholesterolemia polymyalgia rheumatica on steroids shingles postherpetic neuralgia morbid obesity she has had a thyroidectomy she is wheelchair-bound she is a long history of bilateral shoulder pain right greater than left.      ROS/Meds/PSH/PMH/FH/SH: A ten system review of systems was performed and is negative other than what is in the HPI.   Tobacco:  reports that she quit smoking about 50 years ago. She started smoking about 60 years ago. She has a 5.00 pack-year smoking history. She has never used smokeless tobacco.  Visit Vitals  Ht 5\' 2"  (1.575 m)   Wt 185 lb (83.9 kg)   BMI 33.84 kg/m??        Physical Examination:  On exam she is awake elderly female sitting in a wheelchair.  She has a marked restriction in cervical spine range of motion with bilateral scapular pain no winging.    The left shoulder has 0 to 160 degrees of active and passive forward elevation.  ER to 20 degrees.  IR to the greater trochanter.  She has global left shoulder pain.  She is neurovascularly intact.    Right shoulder exam is limited due to significant pain.  Active forward elevation right shoulder is 0 to 90 degrees.  Passively goes to 0-1 20.  ER to 0 degrees.  IR to greater trochanter.  No erythema.  Global right shoulder pain.  She is neurovascular intact      Data Reviewed:        XR: AP AP scapular outlet throwers and axillary views right shoulder   Clinical Indication    ICD-10-CM ICD-9-CM    1. Right shoulder pain, unspecified chronicity  M25.511 719.41 XR SHOULDER RT AP/LAT MIN 2 V      bupivacaine HCl (MARCAINE) 0.5 % (5 mg/mL) injection  22.5 mg      lidocaine (XYLOCAINE) 20 mg/mL (2 %) injection 90 mg      methylPREDNISolone acetate (DEPO-MEDROL) 80 mg/mL injection 80 mg   2. Osteoarthritis of right glenohumeral joint  M19.011 715.91 bupivacaine HCl (MARCAINE) 0.5 % (5 mg/mL) injection 25 mg      lidocaine (XYLOCAINE) 20 mg/mL (2 %) injection 100 mg      REFERRAL TO PHYSICAL THERAPY   3. 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 Right shoulder pain, unspecified chronicity    2. Osteoarthritis of right glenohumeral joint    3. 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 Left shoulder pain  ??? 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 Hypertension  ??? Poorly controlled diabetes mellitus  ??? Hypercholesterolemia  ??? Polymyalgia rheumatica on  steroids  ??? Shingles and postherpetic neuralgia  ??? Obesity  ??? Wheelchair-bound  ??? History of thyroid disease    Plan:   I had a lengthy discussion with the patient and her daughter.  I have given her several options.  We discussed nonoperative versus operative options.  She does not want an injection.  She does not want surgery.  We settled on physical therapy.  So we will put her in formal physical therapy for her neck both shoulders dry needling all modalities I will recheck her progress back in 6 weeks  4 This is a chronic illness/condition with exacerbation and progression    Follow up:   Follow-up and Dispositions    ?? Return in about 6 weeks (around 02/27/2020).                 Orvis Brill., MD

## 2020-01-16 NOTE — Progress Notes (Signed)
Progress Notes by Orvis Brill., MD at 01/16/20 1045                Author: Orvis Brill., MD  Service: --  Author Type: Physician       Filed: 01/16/20 1231  Encounter Date: 01/16/2020  Status: Signed          Editor: Posta, Hinda Glatter., MD (Physician)                          Name: Sarah Chavez   Date of Birth: 12-Oct-1940   Gender: female   MRN: [id number]         What: Right shoulder pain   How: Long history         Referring provider: Dr. Duaine Dredge      HPI: Sarah Chavez is a  79 y.o. right-hand-dominant female seen at the request of Dr. Joanna Puff for problems later to her right shoulder.  She has history of hypertension diabetes mellitus hypercholesterolemia polymyalgia  rheumatica on steroids shingles postherpetic neuralgia morbid obesity she has had a thyroidectomy she is wheelchair-bound she is a long history of bilateral shoulder pain right greater than left.         ROS/Meds/PSH/PMH/FH/SH: A ten system review of systems was performed and is negative other than what is in the HPI.    Tobacco:  reports that she quit smoking about 50 years ago. She started smoking about 60 years ago. She has a 5.00 pack-year smoking history. She has never used smokeless tobacco.   Visit Vitals      Ht  5\' 2"  (1.575 m)        Wt  185 lb (83.9 kg)        BMI  33.84 kg/m??            Physical Examination:   On exam she is awake elderly female sitting in a wheelchair.   She has a marked restriction in cervical spine range of motion with bilateral scapular pain no winging.      The left shoulder has 0 to 160 degrees of active and passive forward elevation.  ER to 20 degrees.  IR to the greater trochanter.  She has global left shoulder pain.  She is neurovascularly intact.      Right shoulder exam is limited due to significant pain.  Active forward elevation right shoulder is 0 to 90 degrees.  Passively goes to 0-1 20.  ER to 0 degrees.  IR to greater trochanter.  No erythema.  Global right  shoulder pain.  She is neurovascular  intact         Data Reviewed:              XR: AP AP scapular outlet throwers and axillary views right shoulder       Clinical Indication       ICD-10-CM  ICD-9-CM        1.  Right shoulder pain, unspecified chronicity   M25.511  719.41  XR SHOULDER RT AP/LAT MIN 2 V            bupivacaine HCl (MARCAINE) 0.5 % (5 mg/mL) injection 22.5 mg            lidocaine (XYLOCAINE) 20 mg/mL (2 %) injection 90 mg            methylPREDNISolone acetate (DEPO-MEDROL) 80 mg/mL injection 80 mg  2.  Osteoarthritis of right glenohumeral joint   M19.011  715.91  bupivacaine HCl (MARCAINE) 0.5 % (5 mg/mL) injection 25 mg            lidocaine (XYLOCAINE) 20 mg/mL (2 %) injection 100 mg            REFERRAL TO PHYSICAL THERAPY      3.  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  Right shoulder pain, unspecified chronicity      2.  Osteoarthritis of right glenohumeral joint         3.  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  Left shoulder pain   ??  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  Hypertension   ??  Poorly controlled diabetes mellitus   ??  Hypercholesterolemia   ??  Polymyalgia rheumatica on steroids   ??  Shingles and postherpetic neuralgia   ??  Obesity   ??  Wheelchair-bound   ??  History of thyroid disease      Plan:    I had a lengthy discussion with the patient and her daughter.  I have given her several options.  We discussed nonoperative versus operative options.  She does not want an injection.  She does not want surgery.  We settled on physical therapy.  So we  will put  her in formal physical therapy for her neck both shoulders dry needling all modalities I will recheck her progress back in 6 weeks   4 This is a chronic illness/condition with exacerbation and progression      Follow up:      Follow-up and Dispositions      ??  Return in about 6 weeks (around 02/27/2020).                            Maryla Morrow., MD

## 2020-01-16 NOTE — Progress Notes (Signed)
 Progress Notes by Fortunato Olam NOVAK, PT at 01/16/20 1400                Author: Fortunato Olam NOVAK, PT  Service: Physical Therapy  Author Type: Physical Therapist       Filed: 01/19/20 0856  Date of Service: 01/16/20 1400  Status: Addendum          Editor: Fortunato Olam NOVAK, PT (Physical Therapist)          Related Notes: Original Note by Fortunato Olam NOVAK, PT (Physical Therapist) filed at 01/18/20  1225               Sarah Chavez   DOB: [birth year]   Payor: Advertising copywriter MEDICARE / Plan: [employer] MEDICARE ADVANTAGE / Product Type: Managed Care Medicare /   [facility]   [address], [place] [address]   Phone:[phone]   Fax:[phone]                   OUTPATIENT PHYSICAL THERAPY: Daily Treatment Note 01/18/2020   Visit Count: 1        ICD-10: Treatment Diagnosis: 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  Difficulty lifting arms and using hands for self care, reaching and pushing and difficulty keeping head up straight and turning neck.   Pain:  Initial:   8-9/10 with motion  Post Session:  No VAS.  No increase in pain reported.         Medications Last Reviewed:  01/16/20      Updated Objective Findings:    See evaluation note from today        TREATMENT:        Therapeutic Exercises: (5  min)  Assigned and practiced the following HEP:   -- instructed pt in sitting with proper pillow support and leaning back into pillows instead of stooping forward for improved posture   -- with posture above- cervical retraction 10x   -- with posture above- scapula squeeze 10x with multiple cues for correct form   -- sitting shoulder ER yellow band one side at at time 10x ea side - to work up to 10x3 for HEP.   HEP: as above, pt given hand out.    Med[employer] Portal      Treatment/Session Summary:       Response to Treatment:  pt demonstrated understanding of exercises but will need review. Would likely do well with exercises in  supine as well for shoulder ROM. SABRA     Communication/Consultation:   None today     Equipment provided today:   yellow theraband.     Recommendations/Intent for next treatment session: Next visit will focus on  ROM or AIS for BIL shoulder and neck.  Trial of manual traction  for neck. Progress HEP. Pt only able to come 1x per week since she must be brought here by her daughter.       Total Treatment Billable Duration:  eval only    PT Patient Time In/Time Out   Time In: 1410   Time Out: 1510      Olam KATHEE Hikes, PT      Future Appointments      Date  Time  Provider  Department  Center      01/23/2020   1:00 PM  Christena Almarie HERO, [employer]  [facility]  [facility]      01/30/2020   1:15 PM  Christena Almarie HERO, PT  [facility]  [facility]      02/06/2020  11:10 AM  Ethyl Elspeth HERO, MD  [facility] PST  PST      02/06/2020   1:00 PM  Hikes Olam KATHEE, PT  [facility]  [facility]      02/13/2020   9:30 AM  ECHO [facility] GVL  [facility] UCDG  UCD      02/13/2020   2:45 PM  Hikes Olam KATHEE, PT  [facility]  [facility]      02/20/2020   1:00 PM  Hikes Olam KATHEE, PT  [facility]  [facility]      02/27/2020   8:45 AM  Oneita Patsy BIRCH, MD  [facility] UCDG  UCD      03/05/2020  10:15 AM  Posta, Dale KANDICE Raddle., MD  COC  COC      03/13/2020   9:40 AM  Rupert Gay PARAS, MD  [facility]  [facility]

## 2020-01-16 NOTE — Other (Addendum)
Therapy Evaluation by Gwendolyn Lima, PT at 01/16/20 1400                Author: Gwendolyn Lima, PT  Service: Physical Therapy  Author Type: Physical Therapist       Filed: 08/08/20 1422  Date of Service: 01/16/20 1400  Status: Addendum          Editor: Gwendolyn Lima, PT (Physical Therapist)       Related Notes: Original Note by Gwendolyn Lima, PT (Physical Therapist) filed at 01/18/20  1313          Cosigner: Orvis Brill., MD at 08/08/20 1652               Sarah Chavez   DOB: November 22, 1940   Primary: [facility]   Secondary: [place] Of [facility] at [facility]   [address], [place] [address]   Phone:[phone]   Fax:[phone]                   OUTPATIENT PHYSICAL THERAPY:Initial Assessment 01/16/2020            ICD-10: Treatment Diagnosis: 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  presents with decreased BIL shoulder ROM, neck ROM and BIL shoulder weakness, decreased R hip flexion strength and c/o difficulty using hands and arms for simple tasks as well as decreased ability to walk or drive.   Pain,  weakness, and decreased ROM  are limiting normalized use and function of BIL UE and R LE.  She also presents with sitting poor posture. She will benefit from PT for guided shoulder rehab to promote return to normalized use of the UE with  house hold activities and self care.        PROBLEM LIST (Impacting functional limitations):   1.  Pain R > L shoulder and in neck   2.  Decreased BIL shoulder ROM, neck ROM   3.  Weakness BIL shoulder, R hip flexion   4.  Poor posture  INTERVENTIONS PLANNED: (Treatment may consist of any combination of the following)   1. Modalities PRN, including ultrasound, estim, iontophoresis, dry needling   2. Soft tissue and joint mobilization for ROM and flexibility   3. Stretching, progressive resistive exercises and HEP for return to functional activities.    4. Back Education and Training for body mechanics with activities of daily living   5. If needed, aquatic therapy.             GOALS: (Goals have been discussed and agreed upon with patient.)   Short-Term Functional Goals: Time Frame: 4 weeks   1.  Neck rotation L > 45 % AROM for improved independence with basic functional activities.    2.  Report no more than 6/10 intermittent pain to BIL shoulder with reaching or  lifting during basic functional activities.    3.  BIL shoulder PROM forward elevation greater than 145 degrees and external rotation greater than 60 degrees to progress into functional ranges.   4.  Demonstrate good BIL shoulder isometric strength with manual testing to progress into strength phase.   5.  Demonstrates improved sitting posture especially with doing exercises.   6.  Independent with initial HEP.   Discharge Goals: Time Frame: 12 weeks   1.  Able to stand at least 5 minutes with good balance for doing self care.    2.  No more than 3/10 intermittent pain BIL shoulder with return to increased independence with most household  activities, and score less than 30% on the DASH.   3.  BIL shoulder AROM forward elevation greater than 120 degrees,  and strength to shoulder are grossly WNL's for safe use with normalized activities.    4.  Demonstrate good functional shoulder strength and endurance for return to normalized household and work  activities.     5.  Independent with advanced shoulder, neck, posture HEP for continued self-management.      OUTCOME MEASURE:    Tool Used: Disabilities of the Arm, Shoulder and Hand (DASH) Questionnaire - Quick Version   Score:   Initial: 44/55   Most Recent: X/55 (Date: -- )        Interpretation of Score: The DASH is designed to measure the activities of daily living in  person's with upper extremity dysfunction or pain.  Each section is scored on a 1-5 scale, 5 representing the greatest disability.  The scores of each section are added together for a total score of 55.  This number is divided by 11, followed by subtracting  1 and multiplying by 25 to get a percent score of disability. This value represents the percentage disability: 0-20% minimal disability; 20-40% moderate disability; 40-60% severe disability; 60-100% dependent for care or exaggerated symptom behavior.   Minimal detectable change is 12%.      Tool Used: Neck Disability Index (NDI)   Score:   Initial: 29/45    Most Recent: X/50 (Date: --  )        Interpretation of Score: The Neck Disability Index is a revised form of the Oswestry  Low Back Pain Index and is designed to measure the activities of daily living in person's with neck pain.  Each section is scored on a 0-5 scale, 5 representing the greatest disability.  The scores of each section are added together for a total score  of 50.    Score  0  1-10  11- 20  21- 30  31- 40  41-49  50      Modifier  CH  CI  CJ  CK  CL  CM  CN              MEDICAL NECESSITY:      Patient is expected to demonstrate progress in strength and range of motion to increase independence with basic functional  activities..   REASON FOR SERVICES/OTHER COMMENTS:     Patient continues to require skilled intervention due to decreased shoulder/neck ROM, pain in these areas and need for guided  rehab towards HEP. .   Total Duration:          Rehabilitation Potential For Stated Goals: Good   Regarding Nehal Green  Goshorn's therapy, I certify that the treatment plan above will be carried out by a therapist or under their direction.  Thank you for this referral,      Jonetta Osgood, PT         Referring Physician Signature: Quincy Carnes Keane Scrape., MD _______________________________ Date _____________             PAIN/SUBJECTIVE:        Initial:   8-9/10  Post Session:  NO VAS but no increase in pain reported.        HISTORY:       History of Injury/Illness (Reason for Referral):   R arm became sore over time but has gotten worse. Having difficulty using hands, reaching for items, some difficulty with don/doff of shirts and has to  modify posture, as well as with pants.  She is does sink washing instead of taking showers..  She states that she has R sided weakness in LE and has numbness but not tingling in her BIL hands.  SHe has been told she has not had a stroke but seems unsure  about this. Xray shows OA to her shoulder. Pt uses a cream to help pain levels in shoulder.    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   Past surgical history:  Ms. Garay  has a past surgical history that includes hx  heent; hx thyroidectomy; hx colonoscopy; hx heart catheterization (2016); and colonoscopy (N/A, 03/01/2018).   Social History/Living Environment:    LIves by herself but has some help from her daughter and grand daughter who come by to her house. Pt lives in a 1 story home.    Prior Level of Function/Work/Activity:   2017 or earlier, use to be able to walk longer and better.  Her shoulder and hands had improved functional capacity about 1 year ago.    Dominant Side:          RIGHT       Ambulatory/Rehab Services H2 Model Falls Risk Assessment        Risk Factors:        (1)  Visual Impairment [specify:  glasses]  Ability to Rise from Chair:        (1)  Pushes up, successful in one attempt       Falls Prevention Plan:        No modifications necessary        Total: (5 or greater = High Risk):  2       2010 [facility] [place]. [place] Adult nurse [phone]. Federal Law prohibits the replication, distribution or use without written  permission from [facility] [employer]       Current Medications:              Current Outpatient Medications:    ?  gabapentin (NEURONTIN) 300 mg capsule, TAKE 1 CAPSULE BY MOUTH THREE TIMES DAILY, Disp: 90 Cap, Rfl: 1   ?  glipiZIDE SR (GLUCOTROL XL) 2.5 mg CR  tablet, TAKE 1 TABLET BY MOUTH DAILY, Disp: 30 Tab, Rfl: 5   ?  nitroglycerin (Nitrostat) 0.4 mg SL tablet, Place 1 sl under the tongue q 5 min prn cp, max 3 sl in a 15-min time period. Call 911 if no  relief after the 3rd sl., Disp: 1 Bottle, Rfl: 11   ?  metoprolol succinate (TOPROL-XL) 50 mg XL tablet, Take 1 Tab by mouth daily., Disp: 90 Tab, Rfl: 3   ?  DULoxetine (CYMBALTA) 60 mg capsule, Take 1 Cap by mouth daily., Disp: 90 Cap, Rfl: 3   ?  pravastatin (PRAVACHOL) 80 mg tablet, TAKE 1 TABLET BY MOUTH DAILY, Disp: 90 Tab, Rfl: 3   ?  predniSONE (DELTASONE) 1 mg tablet, Take 4 pills of the prednisone after breakfast., Disp: 120 Tab, Rfl: 0   ?  glucose blood VI test strips (OneTouch Ultra Blue Test  Strip) strip, Test glucose BID, Disp: 100 Strip, Rfl: 5   ?  cholecalciferol (Vitamin D3) (2,000 UNITS /50 MCG) cap capsule, TAKE 1 CAPSULE BY MOUTH EVERY DAY, Disp: 30 Cap, Rfl: 5   ?  hydroCHLOROthiazide (HYDRODIURIL) 25 mg tablet, Take 1 Tab by mouth daily., Disp: 30 Tab, Rfl: 5   ?  pantoprazole (PROTONIX) 40 mg tablet, Take 1 Tab by mouth daily., Disp: 30 Tab, Rfl: 5   ?  lisinopriL (PRINIVIL, ZESTRIL) 40 mg tablet, Take 1 Tab by mouth daily., Disp: 90 Tab, Rfl: 3       Date Last Reviewed:  01/16/2020        Number of Personal Factors/Comorbidities that affect the Plan of Care:  1-2: MODERATE COMPLEXITY        EXAMINATION:          Observation/Orthostatic Postural Assessment: Pt arrives in wheel chair and sits with stooped or bent over posture. abnormalities noted to shoulder  girdle.     Standing alignment: Spine alignment forward bent posture.     Scapular alignment: not assessed today   Humeral alignment BIL forward shoulder with decreased posterior GH glide   Walking:  Pt walks with decreased ability to flex R hip and instead swings R leg in a more straightened position during L stance phase.     Palpation:  Not today   ROM: can not fully make a fist on R hand ~ 80% full.  L hand is better but also not full.  Has difficulty with 3 finger clasping  And can not  reach to 5th or 4th finger with R thumb.   Cervical        Flexion  full      Extension  20% painful      SB Right  -      SB Left  -      Rotation R  40% painful      Rotation L  60%              AROM  Right shoulder   Left shoulder      Forward elevation  90 painful  100      Abduction  90  100      External rotation  ~ 50 in neutral  ~50 in neutral      Internal rotation          Horz ABD               PROM  RIght shoulder   Left shoulder      Flexion  ~ 130  ~  130      Abduction  -  -      External rotation  -  -      Internal rotation  -  -      Horz ABD  -  -                   Strength:      Right shoulder   Left shoulder      Forward elevation   Painful 4  Painful 4      Abduction  Painful 4  3+      External rotation  3+  3+      Internal rotation  4  3+      Horz ABD  -  -           HIP  Right hip   Left hip      Flexion  3+  5                KNEE          Extension  4+  4+                ANKLE          Flexion  5  54+      Eversion  4+                                  Special Tests:  Impingement tests: + Neers BIl    Rotator Cuff tests: + open can BIL   SC/AC/GH Joint tests: +    Cervical/Thoracic Clearing tests: decreased AP glide of lower cervical and C1/2 rotation R     Joint Accessory Motion testing: stiff to BIL C7/T1 and 1st rib inferior gliding   Neurological Screen: Upper quarter neuro screen:  Pt demonstrates generalized weakness with pain as noted above.  She is + for RTC weakness  so difficult to tell if C5 weakness is from this or not.  C6 - C8 myotome in BIL are WFL.   T1 is mildly weak with finger ADDuction BIL.    Upper quarter Neural Tension Tests: not assessed today   Lower Quarter neuro screen: Decreased L2 mytomome R LE for hip flexion   Functional Mobility:  Sit to/from Stand: Pt able to stand with pushing off from chair with one attempt.   Bed Mobility: not assessed today    Independent with walking very short distances but requires cane.   Independnet with dressing and grooming ADL's but needs assist with buttoning as needed.  She uses sink for hand bathing.             Body Structures Involved:   1.  Joints   2.  Muscles  Body Functions Affected:   1.  Neuromusculoskeletal   2.  Movement Related  Activities and Participation Affected:   1.  General Tasks and Demands   2.  Domestic Life        Number of elements (examined above) that affect the Plan of Care:  3: MODERATE COMPLEXITY        CLINICAL PRESENTATION:        Presentation:  Stable and uncomplicated: LOW COMPLEXITY        CLINICAL DECISION MAKING:        Use of outcome tool(s) and clinical judgement create a POC that gives a:  Clear prediction of patient's  progress: LOW  COMPLEXITY

## 2020-01-16 NOTE — Other (Addendum)
Sarah Chavez  DOB: Nov 08, 1940  Primary: [facility]  Secondary: [place] [facility] at [facility]  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   Pain,  weakness, and decreased ROM are limiting normalized use and function of BIL UE and R LE.  She also presents with sitting poor posture. She will benefit from PT for guided shoulder rehab to promote return to normalized use of the UE with house hold activities and self care.   PROBLEM LIST (Impacting functional limitations):  1. Pain R > L shoulder and in neck  2. Decreased BIL shoulder ROM, neck ROM  3. Weakness BIL shoulder, R hip flexion  4. Poor posture INTERVENTIONS PLANNED: (Treatment may consist of any combination of the following)  1. Modalities PRN, including ultrasound, estim, iontophoresis, dry needling  2. Soft tissue and joint mobilization for ROM and flexibility  3. Stretching, progressive resistive exercises and HEP for return to functional  activities.   4. Back Education and Training for body mechanics with activities of daily living  5. If needed, aquatic therapy.       GOALS: (Goals have been discussed and agreed upon with patient.)  Short-Term Functional Goals: Time Frame: 4 weeks  1. Neck rotation L > 45 % AROM for improved independence with basic functional activities.   2. Report no more than 6/10 intermittent pain to BIL shoulder with reaching or lifting during basic functional activities.   3. BIL shoulder PROM forward elevation greater than 145 degrees and external rotation greater than 60 degrees to progress into functional ranges.  4. Demonstrate good BIL shoulder isometric strength with manual testing to progress into strength phase.  5. Demonstrates improved sitting posture especially with doing exercises.  6. Independent with initial HEP.  Discharge Goals: Time Frame: 12 weeks  1. Able to stand at least 5 minutes with good balance for doing self care.   2. No more than 3/10 intermittent pain BIL shoulder with return to increased independence with most household  activities, and score less than 30% on the DASH.  3. BIL shoulder AROM forward elevation greater than 120 degrees,  and strength to shoulder are grossly WNL's for safe use with normalized activities.   4. Demonstrate good functional shoulder strength and endurance for return to normalized household and work activities.    5. Independent with advanced shoulder, neck, posture HEP for continued self-management.    OUTCOME MEASURE:   Tool Used: Disabilities of the Arm, Shoulder and Hand (DASH) Questionnaire - Quick Version  Score:  Initial: 44/55  Most Recent: X/55 (Date: -- )  Interpretation of Score: The DASH is designed to measure the activities of daily living in person's with upper extremity dysfunction or pain.  Each section is scored on a 1-5 scale, 5 representing the greatest disability.  The scores of each section are added together for a total score of 55.  This number is  divided by 11, followed by subtracting 1 and multiplying by 25 to get a percent score of disability. This value represents the percentage disability: 0-20% minimal disability; 20-40% moderate disability; 40-60% severe disability; 60-100% dependent for care or exaggerated symptom behavior.  Minimal detectable change is 12%.    Tool Used: Neck Disability Index (NDI)  Score:  Initial: 29/45  Most Recent: X/50 (Date: -- )   Interpretation of Score: The Neck Disability Index is a revised form of the Oswestry Low Back Pain Index and is designed to measure the activities of daily living in person's with neck pain.  Each section is scored on a 0-5 scale, 5 representing the greatest disability.  The scores of each section are added together for a total score of 50.   Score 0 1-10 11-20 21-30 31-40 41-49 50   Modifier CH CI CJ CK CL CM CN       MEDICAL NECESSITY:   ?? Patient is expected to demonstrate progress in strength and range of motion to increase independence with basic functional activities..  REASON FOR SERVICES/OTHER COMMENTS:  ?? Patient continues to require skilled intervention due to decreased shoulder/neck ROM, pain in these areas and need for guided rehab towards HEP. .  Total Duration:       Rehabilitation Potential For Stated Goals: Good  Regarding Sarah Chavez's therapy, I certify that the treatment plan above will be carried out by a therapist or under their direction.  Thank you for this referral,    Gwendolyn Lima, PT       Referring Physician Signature: Melanee Left Hinda Glatter., MD _______________________________ Date _____________       PAIN/SUBJECTIVE:   Initial:   8-9/10 Post Session:  NO VAS but no increase in pain reported.    HISTORY:   History of Injury/Illness (Reason for Referral):  R arm became sore over time but has gotten worse. Having difficulty using hands, reaching for items, some difficulty with don/doff of shirts and has to modify posture, as well as with pants.  She is does sink  washing instead of taking showers..  She states that she has R sided weakness in LE and has numbness but not tingling in her BIL hands.  SHe has been told she has not had a stroke but seems unsure about this. Xray shows OA to her shoulder. Pt uses a cream to help pain levels in shoulder.   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  Past surgical history:  Sarah Chavez  has a past surgical history that includes hx heent; hx thyroidectomy; hx colonoscopy; hx  heart catheterization (2016); and colonoscopy (N/A, 03/01/2018).  Social History/Living Environment:   LIves by herself but has some help from her daughter and grand daughter who come by to her house. Pt lives in a 1 story home.   Prior Level of Function/Work/Activity:  2017 or earlier, use to be able to walk longer and better.  Her shoulder and hands had improved functional capacity about 1 year ago.   Dominant Side:         RIGHT   Ambulatory/Rehab Services H2 Model Falls Risk Assessment   Risk Factors:       (1)  Visual Impairment [specify:  glasses] Ability to Rise from Chair:       (1)  Pushes up, successful in one attempt   Falls  Prevention Plan:       No modifications necessary   Total: (5 or greater = High Risk): 2   ??2010 [facility] [place]. [place] Adult nurse [phone]. Federal Law prohibits the replication, distribution or use without written permission from [facility] [employer]   Current Medications:       Current Outpatient Medications:   ???  gabapentin (NEURONTIN) 300 mg capsule, TAKE 1 CAPSULE BY MOUTH THREE TIMES DAILY, Disp: 90 Cap, Rfl: 1  ???  glipiZIDE SR (GLUCOTROL XL) 2.5 mg CR tablet, TAKE 1 TABLET BY MOUTH DAILY, Disp: 30 Tab, Rfl: 5  ???  nitroglycerin (Nitrostat) 0.4 mg SL tablet, Place 1 sl under the tongue q 5 min prn cp, max 3 sl in a 15-min time period. Call 911 if no relief after the 3rd sl., Disp: 1 Bottle, Rfl: 11  ???  metoprolol succinate (TOPROL-XL) 50 mg XL tablet, Take 1 Tab by mouth daily., Disp: 90 Tab, Rfl: 3  ???  DULoxetine (CYMBALTA) 60 mg capsule, Take 1 Cap by mouth daily., Disp: 90 Cap, Rfl: 3  ???  pravastatin (PRAVACHOL) 80 mg tablet, TAKE 1 TABLET BY MOUTH DAILY, Disp: 90 Tab, Rfl: 3  ???  predniSONE (DELTASONE) 1 mg tablet, Take 4 pills of the prednisone after breakfast., Disp: 120 Tab, Rfl: 0  ???  glucose blood VI test strips (OneTouch Ultra Blue Test Strip) strip, Test glucose BID, Disp: 100 Strip, Rfl: 5  ???  cholecalciferol (Vitamin D3) (2,000 UNITS /50 MCG) cap capsule, TAKE 1 CAPSULE BY MOUTH EVERY DAY, Disp: 30 Cap, Rfl: 5  ???  hydroCHLOROthiazide (HYDRODIURIL) 25 mg tablet, Take 1 Tab by mouth daily., Disp: 30 Tab, Rfl: 5  ???  pantoprazole (PROTONIX) 40 mg tablet, Take 1 Tab by mouth daily., Disp: 30 Tab, Rfl: 5  ???  lisinopriL (PRINIVIL, ZESTRIL) 40 mg tablet, Take 1 Tab by mouth daily., Disp: 90 Tab, Rfl: 3   Date Last Reviewed:  01/16/2020   Number of Personal Factors/Comorbidities that affect the Plan of Care: 1-2: MODERATE COMPLEXITY   EXAMINATION:     Observation/Orthostatic Postural Assessment: Pt arrives in wheel chair and sits with stooped or bent over posture.  abnormalities noted to shoulder girdle.    Standing alignment: Spine alignment forward bent posture.    Scapular alignment: not assessed today  Humeral alignment BIL forward shoulder with decreased posterior GH glide  Walking:  Pt walks with decreased ability to flex R hip and instead swings R leg in a more straightened position during L stance phase.    Palpation:  Not today  ROM: can not fully make a fist on R hand ~ 80% full.  L hand is better but  also not full.  Has difficulty with 3 finger clasping  And can not reach to 5th or 4th finger with R thumb.  Cervical    Flexion full   Extension 20% painful   SB Right -   SB Left -   Rotation R 40% painful   Rotation L 60%   ??    AROM Right shoulder  Left shoulder   Forward elevation 90 painful 100   Abduction 90 100   External rotation ~ 50 in neutral ~50 in neutral   Internal rotation     Horz ABD     ??  PROM RIght shoulder  Left shoulder   Flexion ~ 130 ~130   Abduction - -   External rotation - -   Internal rotation - -   Horz ABD - -   ??     Strength:    Right shoulder  Left shoulder   Forward elevation Painful 4 Painful 4   Abduction Painful 4 3+   External rotation 3+ 3+   Internal rotation 4 3+   Horz ABD - -   ??  HIP Right hip  Left hip   Flexion 3+ 5        KNEE     Extension 4+ 4+        ANKLE     Flexion 5 54+   Eversion 4+                Special Tests:  Impingement tests: + Neers BIl   Rotator Cuff tests: + open can BIL  SC/AC/GH Joint tests: +   Cervical/Thoracic Clearing tests: decreased AP glide of lower cervical and C1/2 rotation R    Joint Accessory Motion testing: stiff to BIL C7/T1 and 1st rib inferior gliding  Neurological Screen: Upper quarter neuro screen:  Pt demonstrates generalized weakness with pain as noted above.  She is + for RTC weakness so difficult to tell if C5 weakness is from this or not.  C6 - C8 myotome in BIL are WFL.   T1 is mildly weak with finger ADDuction BIL.   Upper quarter Neural Tension Tests: not assessed today  Lower  Quarter neuro screen: Decreased L2 mytomome R LE for hip flexion  Functional Mobility:  Sit to/from Stand: Pt able to stand with pushing off from chair with one attempt.  Bed Mobility: not assessed today   Independent with walking very short distances but requires cane.   Independnet with dressing and grooming ADL's but needs assist with buttoning as needed.  She uses sink for hand bathing.      Body Structures Involved:  1. Joints  2. Muscles Body Functions Affected:  1. Neuromusculoskeletal  2. Movement Related Activities and Participation Affected:  1. General Tasks and Demands  2. Domestic Life   Number of elements (examined above) that affect the Plan of Care: 3: MODERATE COMPLEXITY   CLINICAL PRESENTATION:   Presentation: Stable and uncomplicated: LOW COMPLEXITY   CLINICAL DECISION MAKING:   Use of outcome tool(s) and clinical judgement create a POC that gives a: Clear prediction of patient's progress: LOW COMPLEXITY

## 2020-01-18 NOTE — Telephone Encounter (Addendum)
-----   Message from Maree Krabbe, MD sent at 01/18/2020  9:01 AM EDT -----  Call pt elevated stay on water pill  01/18/20 Called and informed pt/  Pt v/u

## 2020-01-23 ENCOUNTER — Inpatient Hospital Stay: Admit: 2020-01-23 | Payer: MEDICARE | Primary: Family Medicine

## 2020-01-23 NOTE — Progress Notes (Signed)
 Progress Notes by Christena Almarie HERO, PT at 01/23/20 1302                Author: Christena Almarie HERO, PT  Service: Physical Therapy  Author Type: Physical Therapist       Filed: 01/23/20 1420  Date of Service: 01/23/20 1302  Status: Signed          Editor: Christena Almarie HERO, PT (Physical Therapist)               Sarah Chavez   DOB: September 09, 1940   Payor: Advertising copywriter MEDICARE / Plan: [employer] UNITED [employer] MEDICARE ADVANTAGE / Product Type: Managed Care Medicare /   Therapy Center at [facility]   [address], [address], [place] [address]   Phone:[phone]   [phone]                   OUTPATIENT PHYSICAL THERAPY: Daily Treatment Note 01/23/2020   Visit Count: 2        ICD-10: Treatment Diagnosis: 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  Wakes me up at night. Pt reports sleeping sitting up on the sofa   Pain:  Initial:   7- 9/10 right shoulder, 5/10 left shoulder. Both increase with motion  Post Session: I actually feel better         Medications Last Reviewed:  01/23/20      Updated Objective Findings:    Severe slouched posture with rounded shoulders and forward head facing the floor.   ROM:  In supine pt required two pillows and tri folded towel under the head prior to treatment, one pillow with bi folded pillow after  treatment                      RUE PROM   R Shoulder Flexion: 86(100 after treatment)   R Shoulder External Rotation: 40(50 after treatment)                                      TREATMENT:        Therapeutic Exercises: ( 41 min)  Reviewed HEP for posture correction, B scapular retraction and  chin tuck with max verbal and tactile cueing for technique.     -- supine B pect stretch   -- supine grade 3 to 4-- right humeral AP glides   -- supine manual traction in position of comfort   -- supine cervical retraction x8 into two pillows and trifold towel, x8 into two pillows, x8 into 1 pillow with trifold pillow   -- supine scapula squeeze 10x 3 with manual cervical traction   -- supine  B midtrap press into bed 3x5   -- supine B shoulder ER yellow band 10x 2   -- hooklying small amp LTR with emphasis on B scapular retraction hold, 1 pillow and trifold towel 3x5   -- hooklying small amp bridge holding retracted scapula, one pillow and 1 bifold towel 3x5   -- supine chest press 3x5 reps   -- supine forward elevation with scapular depression. Pain free ROM 3x5   -- supine guided pt reach for the back of her head 2x5   -- sitting posture correction 3 second holds with manual depression assist at Tspine/manual sternal elevation 3x5   -- sitting trunk extn into tball 2x8, into folded pillow x8 for improved posture      HEP: continue current program. Can try in supine as practiced if able on her bed       Treatment/Session Summary:       Response to Treatment:  pt demonstrated understanding of exercises but will need review. Would likely do well with exercises in  supine as well for shoulder ROM. Sarah Chavez     Communication/Consultation:   None today     Equipment provided today: nothing new     Recommendations/Intent for next treatment session: continue supine strengthening for posture and B shoulders       Total Treatment Billable Duration: 41  minutes   PT Patient Time In/Time Out   Time In: 1300   Time Out: 1345      Almarie CHRISTELLA Lane, PT      Future Appointments      Date  Time  Provider  Department  Center      01/30/2020   1:15 PM  Lane Almarie CHRISTELLA, [place]  [facility]  [facility]      02/06/2020  11:10 AM  Ethyl Elspeth CHRISTELLA, MD  [facility] PST  PST      02/06/2020   1:00 PM  Fortunato Olam NOVAK, PT  [facility]  [facility]      02/13/2020   9:30 AM  ECHO 21 GVL  [facility] UCDG  [facility]CD      02/13/2020   2:45 PM  Fortunato Olam NOVAK, PT  [facility]  [facility]      02/20/2020   1:00 PM  Fortunato Olam NOVAK, PT  [facility]  [facility]      02/27/2020   8:45 AM  Oneita Patsy BIRCH, MD  [facility] UCDG  UCD      03/05/2020  10:15 AM  Posta, Dale KANDICE Raddle., MD  COC  COC      03/13/2020   9:40 AM  Rupert Gay PARAS, MD  [facility]  [facility]

## 2020-01-23 NOTE — Progress Notes (Signed)
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  Wakes me up at night. Pt reports sleeping sitting up on the sofa  Pain: Initial:   7- 9/10 right shoulder, 5/10 left shoulder. Both increase with motion Post Session: I actually feel better    Medications Last Reviewed:  01/23/20    Updated Objective Findings:   Severe slouched posture with rounded shoulders and forward head facing the floor.  ROM: In supine pt required two pillows and tri folded towel under the head prior to treatment, one pillow with bi folded pillow after treatment                RUE PROM  R Shoulder Flexion: 86(100 after treatment)  R Shoulder External Rotation: 40(50 after treatment)                      TREATMENT:   Therapeutic Exercises: ( 41 min)  Reviewed HEP for posture correction, B scapular retraction and chin tuck with max verbal and tactile cueing for technique.    -- supine B pect stretch  -- supine grade 3 to 4-- right humeral AP  glides  -- supine manual traction in position of comfort  -- supine cervical retraction x8 into two pillows and trifold towel, x8 into two pillows, x8 into 1 pillow with trifold pillow  -- supine scapula squeeze 10x 3 with manual cervical traction  -- supine B midtrap press into bed 3x5  -- supine B shoulder ER yellow band 10x 2  -- hooklying small amp LTR with emphasis on B scapular retraction hold, 1 pillow and trifold towel 3x5  -- hooklying small amp bridge holding retracted scapula, one pillow and 1 bifold towel 3x5  -- supine chest press 3x5 reps  -- supine forward elevation with scapular depression. Pain free ROM 3x5  -- supine guided pt reach for the back of her head 2x5  -- sitting posture correction 3 second holds with manual depression assist at Tspine/manual sternal elevation 3x5  -- sitting trunk extn into tball 2x8, into folded pillow x8 for improved posture    HEP: continue current program. Can try in supine as practiced if able on her bed     Treatment/Session Summary:    ?? Response to Treatment:  pt demonstrated understanding of exercises but will need review. Would likely do well with exercises in supine as well for shoulder ROM. Marland Kitchen  ?? Communication/Consultation:  None today  ?? Equipment provided today: nothing new  ?? Recommendations/Intent  for next treatment session: continue supine strengthening for posture and B shoulders     Total Treatment Billable Duration: 41  minutes  PT Patient Time In/Time Out  Time In: 1300  Time Out: 1345    Helyn Numbers, PT    Future Appointments   Date Time Provider Department Center   01/30/2020  1:15 PM Helyn Numbers, [place] [facility]   02/06/2020 11:10 AM Delana Meyer, MD [facility] PST PST   02/06/2020  1:00 PM Gwendolyn Lima, PT [facility]   02/13/2020  9:30 AM ECHO 21 GVL [facility] UCD[facility] UCD   02/13/2020  2:45 PM Gwendolyn Lima, PT [facility]   02/20/2020  1:00 PM Gwendolyn Lima, PT [facility]   02/27/2020  8:45 AM Maree Krabbe, MD  [facility] UCD   03/05/2020 10:15 AM Posta, Hinda Glatter., MD COC COC   03/13/2020  9:40 AM Susy Manor, MD [facility]

## 2020-01-30 ENCOUNTER — Inpatient Hospital Stay: Admit: 2020-01-30 | Payer: MEDICARE | Primary: Family Medicine

## 2020-01-30 NOTE — Progress Notes (Signed)
Progress Notes by Helyn Numbers, PT at 01/30/20 1317                Author: Helyn Numbers, PT  Service: Physical Therapy  Author Type: Physical Therapist       Filed: 01/30/20 1624  Date of Service: 01/30/20 1317  Status: Signed          Editor: Helyn Numbers, PT (Physical Therapist)               Sarah Chavez   DOB: [birth year]   Payor: Advertising copywriter MEDICARE / Plan: [employer] MEDICARE ADVANTAGE / Product Type: Managed Care Medicare /   Therapy Center at [facility]   [address], [address], [place] [address]   Phone:[phone]   [phone]                   OUTPATIENT PHYSICAL THERAPY: Daily Treatment Note 01/30/2020   Visit Count: 3        ICD-10: Treatment Diagnosis: 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   Not really waking her up at night.    Pain:  Initial:   6/10 right shoulder,   3-4 /10 left shoulder.   Post Session: I feel good, no pain        Medications Last Reviewed:  01/30/20      Updated Objective Findings:    Still with slouched posture with rounded shoulders and forward head facing the floor when she arrived in a wheelchair.    In supine pt required three pillows under the head prior  to treatment, one pillow after treatment   ROM:n/t                                                              TREATMENT:        Therapeutic Exercises: ( 40 min)  for posture correction, B scapular retraction and chin tuck with  max verbal and tactile cueing for technique.   -- supine B pect stretch   -- hooklying manual traction in position of comfort   -- supine cervical retraction x10 into two pillows 2x10,  1 pillow x10   -- supine scapula squeeze 10x with manual cervical traction   -- supine B midtrap press into bed 3x 10   -- hooklying small amp LTR with emphasis on B scapular retraction hold, 1 pillow 2x10   -- hooklying small amp bridge holding retracted scapula, one pillow 2x10   -- hooklying forward elevation with scapular depression B 2x10.   --  hooklying guided pt reach for the back of her head 2x10   -- sitting posture correction 3 second holds with manual depression assist at Tspine/manual sternal elevation 3x5   -- sitting trunk extn into tball 3x10   -- sitting B shoulder ER yellow band 10x 2   --sitting rows with yellow tband 2x10      HEP: continue current program. Can try in bed if tolerated       Treatment/Session Summary:       Response to Treatment:  Great improvement in supine neck extn with only needing 1 pillow at end of session today. Needs to improve  retraction strength. Better sitting posture after session     Communication/Consultation:   None today     Equipment provided today: nothing new     Recommendations/Intent for next treatment session: continue supine strengthening for posture and B shoulders       Total Treatment Billable Duration: 40  minutes   PT Patient Time In/Time Out   Time In: 1315   Time Out: 1400      Helyn Numbers, PT      Future Appointments      Date  Time  Provider  Department  Center      02/06/2020  11:10 AM  Delana Meyer, MD  [facility] PST  PST      02/06/2020   1:00 PM  Gwendolyn Lima, PT  [facility]  [facility]      02/13/2020   9:30 AM  ECHO 21 GVL  SS[facility] UCDG  UCD      02/13/2020   2:45 PM  Gwendolyn Lima, PT  [facility]  [facility]      02/20/2020   1:00 PM   Gwendolyn Lima, PT  [facility]  [facility]      02/27/2020   8:45 AM  Maree Krabbe, MD  [facility] UCDG  UCD      03/05/2020  10:15 AM  Posta, Hinda Glatter., MD  COC  COC      03/13/2020   9:40 AM  Susy Manor, MD  [facility]  [facility]

## 2020-01-30 NOTE — Progress Notes (Signed)
Sarah Chavez  DOB: 1940/11/13  Payor: Advertising copywriter MEDICARE / Plan: [employer] UNITED HE[employer] MEDICARE ADVANTAGE / Product Type: Managed Care Medicare /  Therapy Center at [facility]  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  Not really waking her up at night.   Pain: Initial:   6/10 right shoulder,   3-4 /10 left shoulder.  Post Session: I feel good, no pain   Medications Last Reviewed:  01/30/20    Updated Objective Findings:   Still with slouched posture with rounded shoulders and forward head facing the floor when she arrived in a wheelchair.   In supine pt required three pillows under the head prior to treatment, one pillow after treatment  ROM:n/t                                       TREATMENT:   Therapeutic Exercises: ( 40 min)  for posture correction, B scapular retraction and chin tuck with max verbal and tactile cueing for technique.  -- supine B pect stretch  -- hooklying manual traction in position of comfort  -- supine cervical retraction x10 into two pillows 2x10,  1 pillow x10  -- supine scapula squeeze 10x with manual cervical traction  -- supine B midtrap press into bed 3x  10  -- hooklying small amp LTR with emphasis on B scapular retraction hold, 1 pillow 2x10  -- hooklying small amp bridge holding retracted scapula, one pillow 2x10  -- hooklying forward elevation with scapular depression B 2x10.  -- hooklying guided pt reach for the back of her head 2x10  -- sitting posture correction 3 second holds with manual depression assist at Tspine/manual sternal elevation 3x5  -- sitting trunk extn into tball 3x10  -- sitting B shoulder ER yellow band 10x 2  --sitting rows with yellow tband 2x10    HEP: continue current program. Can try in bed if tolerated     Treatment/Session Summary:    ?? Response to Treatment:  Great improvement in supine neck extn with only needing 1 pillow at end of session today. Needs to improve retraction strength. Better sitting posture after session  ?? Communication/Consultation:  None today  ?? Equipment provided today: nothing new  ?? Recommendations/Intent for next treatment session: continue supine strengthening for posture and B shoulders     Total Treatment Billable Duration: 40  minutes  PT Patient Time In/Time Out  Time In: 1315  Time Out: 1400    Helyn Numbers, PT    Future Appointments   Date Time Provider Department Center   02/06/2020 11:10 AM Delana Meyer,  MD [facility] PST PST   02/06/2020  1:00 PM Gwendolyn Lima, PT [facility]   02/13/2020  9:30 AM ECHO 21 GVL [facility] UCDG UCD   02/13/2020  2:45 PM Gwendolyn Lima, PT [facility]   02/20/2020  1:00 PM Gwendolyn Lima, PT [facility]   02/27/2020  8:45 AM Maree Krabbe, MD [facility]CD[facility] UCD   03/05/2020 10:15 AM Posta, Hinda Glatter., MD COC COC   03/13/2020  9:40 AM Susy Manor, MD [facility]

## 2020-02-06 ENCOUNTER — Ambulatory Visit: Attending: Family Medicine | Primary: Family Medicine

## 2020-02-06 ENCOUNTER — Inpatient Hospital Stay: Admit: 2020-02-06 | Payer: MEDICARE | Primary: Family Medicine

## 2020-02-06 ENCOUNTER — Ambulatory Visit: Admit: 2020-02-06 | Discharge: 2020-02-06 | Payer: MEDICARE | Attending: Family Medicine | Primary: Family Medicine

## 2020-02-06 DIAGNOSIS — M19011 Primary osteoarthritis, right shoulder: Secondary | ICD-10-CM

## 2020-02-06 DIAGNOSIS — R600 Localized edema: Secondary | ICD-10-CM

## 2020-02-06 NOTE — Progress Notes (Addendum)
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   Pain: Initial:   no VAS Post Session: no pain   Medications Last Reviewed:  01/30/20  Pt arrives 15 min late  Updated Objective Findings:   Walked into clinic with quad cane on R side, walking with marked flexed, slumped shoulder posture and decreased ability to flex R hip.   ROM:                RUE PROM  R Shoulder Flexion:  (~ 105degrees)                      TREATMENT:   Therapeutic Exercises: ( 30 min)  for posture correction in sitting and standing  -- worked with standing and walking posture with use of standard walker in clinic  -- sitting posture with hips higher then knees and upright posture with focus on avoiding PPT ~ 7 min  -- sitting AAROM shoulder flexion with hands clasped 10x  -- sitting hip flexion 5x2 ea side  -- sitting yellow band horizontal ABD 8x  -- sitting BIL scapular retraction and chin tuck with max verbal and tactile cueing for  technique.  -- supine B pect stretch  -- sitting cervical retraction with scap squeeze 10x  -- sitting yellow band flexion with lower trap focus 8x  -- hooklying manual traction in position of comfort    HEP: continue current program with addition of sitting with corrections made     Treatment/Session Summary:    ?? Response to Treatment:  Pt is improving posture and shoulder ROM but continues with shoulder girdles and shoulder weakness.  COntinue with significant weakness to R hip flexion.   ?? Communication/Consultation:  None today  ?? Equipment provided today: nothing new  ?? Recommendations/Intent for next treatment session: continue supine strengthening for posture and B shoulders     Total Treatment Billable Duration: 30  minutes  PT Patient Time In/Time Out  Time In: 1315  Time Out: 1350    Gwendolyn Lima, PT    Future Appointments   Date Time Provider Department Center   02/13/2020  9:30 AM ECHO 21 GVL [facility] UCDG UCD   02/13/2020  2:45 PM Gwendolyn Lima, PT [facility]   02/20/2020  1:00 PM Gwendolyn Lima, PT [facility]   02/27/2020  8:45 AM Maree Krabbe, MD [facility] UCD   03/05/2020 10:15 AM Posta, Hinda Glatter., MD COC COC   03/13/2020  9:40 AM Susy Manor, MD [facility]   04/09/2020  2:30 PM PST LAB [facility] PST PST   04/16/2020 11:10 AM Delana Meyer, MD [facility] PST PST

## 2020-02-06 NOTE — Progress Notes (Signed)
SUBJECTIVE:   Sarah Chavez is a 79 y.o. female who has a past medical history significant for hypertension, diabetes, high cholesterol, polymyalgia rheumatica, overactive bladder, lower extremity edema, sleep apnea, DJD, vitamin D deficiency, anemia of chronic disease, CAD, shingles and postherpetic neuralgia.  At previous visit I discontinued her Norvasc due to worsening lower extremity edema.  She was converted to Toprol-XL.  She has tolerated this change well and reports improvement in her lower extremity edema although it has not resolved completely.  In addition I placed her on Cymbalta for her postherpetic neuralgia.  She states that she could not tolerate this and is discontinued it.  Since I last seen the patient she has been evaluated by her cardiologist.  She had lab work ordered including a TSH, CBC and BNP.  TSH was normal.  Hemoglobin was 10.2 up from 8.9.  MCV was 87.  BNP was elevated at 1447.    Patient reports no active chest pain, orthopnea or PND.  GI and GU review of systems is unremarkable.  Current medicines are listed in the EMR and reviewed today.    HPI  See above    Past Medical History, Past Surgical History, Family history, Social History, and Medications were all reviewed with the patient today and updated as necessary.       Current Outpatient Medications   Medication Sig Dispense Refill   ??? gabapentin (NEURONTIN) 300 mg capsule TAKE 1 CAPSULE BY MOUTH THREE TIMES DAILY 90 Cap 1   ??? glipiZIDE SR (GLUCOTROL XL) 2.5 mg CR tablet TAKE 1 TABLET BY MOUTH DAILY 30 Tab 5   ??? nitroglycerin (Nitrostat) 0.4 mg SL tablet Place 1 sl under the tongue q 5 min prn cp, max 3 sl in a 15-min time period. Call 911 if no relief after the 3rd sl. 1 Bottle 11   ??? metoprolol succinate (TOPROL-XL) 50 mg XL tablet Take 1 Tab by mouth daily. 90 Tab 3   ??? DULoxetine (CYMBALTA) 60 mg capsule Take 1 Cap by mouth daily. 90 Cap 3   ??? pravastatin (PRAVACHOL) 80 mg tablet TAKE 1 TABLET BY MOUTH DAILY 90 Tab 3    ??? predniSONE (DELTASONE) 1 mg tablet Take 4 pills of the prednisone after breakfast. 120 Tab 0   ??? glucose blood VI test strips (OneTouch Ultra Blue Test Strip) strip Test glucose BID 100 Strip 5   ??? cholecalciferol (Vitamin D3) (2,000 UNITS /50 MCG) cap capsule TAKE 1 CAPSULE BY MOUTH EVERY DAY 30 Cap 5   ??? hydroCHLOROthiazide (HYDRODIURIL) 25 mg tablet Take 1 Tab by mouth daily. 30 Tab 5   ??? pantoprazole (PROTONIX) 40 mg tablet Take 1 Tab by mouth daily. 30 Tab 5   ??? lisinopriL (PRINIVIL, ZESTRIL) 40 mg tablet Take 1 Tab by mouth daily. 90 Tab 3     Allergies   Allergen Reactions   ??? Ativan [Lorazepam] Other (comments)     Made her feel crazy and chest pressure   ??? Ativan [Lorazepam] Other (comments)     Chest fullness   ??? Chlorpheniramine Maleate Other (comments)     Altered heart rate   ??? Other Medication Palpitations     Some type of decongestant   ??? Other Medication Other (comments)     Doesn't take decongestants, but can't recall why   ??? Pseudoephedrine Hcl Other (comments)     Altered heart rate     Patient Active Problem List   Diagnosis Code   ??? HTN (  hypertension) I10   ??? Diabetes mellitus type 2, controlled (HCC) E11.9   ??? Coronary artery disease involving native coronary artery of native heart without angina pectoris I25.10   ??? Dyslipidemia E78.5   ??? OSA on CPAP G47.33, Z99.89   ??? Severe obesity (HCC) E66.01   ??? Elevated sed rate R70.0   ??? Normocytic anemia D64.9   ??? Vitamin D deficiency E55.9   ??? Polymyalgia rheumatica (HCC) M35.3   ??? Murmur R01.1   ??? Localized edema R60.0     Past Medical History:   Diagnosis Date   ??? Abdominal pain 07/22/2015   ??? Anemia    ??? Back pain    ??? Bursitis of shoulder    ??? CAD (coronary artery disease)    ??? Cerumen impaction    ??? Chest pain, atypical 07/22/2015   ??? Diabetes (HCC)     no meds, A1c 09/28/17 6.6; avg bs 105; denies ss of hypo   ??? Diabetes mellitus out of control (HCC)    ??? Endocrine disease     thyroid   ??? Fatigue    ??? GERD (gastroesophageal reflux disease)      controlled with medicaitons   ??? Hip pain, right    ??? Hyperlipidemia, mild    ??? Hypertension     managed with medication   ??? Hypertension associated with diabetes (HCC) 07/22/2015   ??? 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 Left carotid bruit    ??? Lumbago 07/22/2015   ??? Morbid obesity (HCC) 07/22/2015    BMI 35.3   ??? OSA on CPAP 07/22/2015    uses CPAP   ??? Other ill-defined conditions(799.89)     cholesterol   ??? Other ill-defined conditions(799.89)     heart cath 96   ??? Shoulder pain, acute    ??? Shoulder pain, bilateral    ??? Sinusitis, acute maxillary    ??? Unstable angina (HCC) 07/06/2015   ??? Vitamin D deficiency    ??? Vitamin D deficiency 04/12/2019   ??? Weakness      Past Surgical History:   Procedure Laterality Date   ??? COLONOSCOPY N/A 03/01/2018    COLONOSCOPY/BMI 39 performed by Mazanec, Worthy Rancher, MD at [facility] ENDOSCOPY   ??? HX COLONOSCOPY     ??? HX HEART CATHETERIZATION  2016    without intervention   ??? HX HEENT      goiter/thyroid surgery   ??? HX THYROIDECTOMY       Family History   Problem Relation Age of Onset   ??? Stroke Sister    ??? Cancer Sister         cervical   ??? Stroke Brother    ??? Diabetes Other    ??? Hypertension Other      Social History     Tobacco Use   ??? Smoking status: Former Smoker     Packs/day: 0.50     Years: 10.00     Pack years: 5.00     Start date: 1961     Quit date: 1971     Years since quitting: 50.4   ??? Smokeless tobacco: Never Used   ??? Tobacco comment: quit in 1971   Substance Use Topics   ??? Alcohol use: No         Review of Systems  See above    OBJECTIVE:  Visit Vitals  BP 138/66   Ht 5\' 2"  (1.575 m)   Wt 195 lb (88.5 kg)  BMI 35.67 kg/m??        Physical Exam  Constitutional:       Appearance: She is well-developed. She is obese.   HENT:      Head: Normocephalic and atraumatic.   Eyes:      Pupils: Pupils are equal, round, and reactive to light.   Neck:      Thyroid: No thyromegaly.      Vascular: No JVD.   Cardiovascular:      Rate and Rhythm: Normal rate and regular  rhythm.      Heart sounds: Normal heart sounds. No murmur heard.   No friction rub. No gallop.    Pulmonary:      Effort: Pulmonary effort is normal. No respiratory distress.      Breath sounds: No wheezing or rales.   Abdominal:      Palpations: Abdomen is soft.      Tenderness: There is no abdominal tenderness. There is no guarding or rebound.   Musculoskeletal:         General: Normal range of motion.      Cervical back: Normal range of motion and neck supple.      Comments: Lower extremities reveal trace to 1+ pitting edema to the mid shins bilaterally with the right leg being more prominently swollen than the left.  No cords are felt.  Negative Homans' sign.   Skin:     Findings: No rash.   Neurological:      Mental Status: She is alert and oriented to person, place, and time.         Medical problems and test results were reviewed with the patient today.         ASSESSMENT and PLAN    1.  Lower extremity edema.  Improved with discontinuation of Norvasc.  Dependent edema continues to be problematic.  Encourage elevation of lower extremities and use of support hose.    2.  Hypertension.  Blood pressure shows improvement since adjustments made at previous visit.  Current blood pressure reading 138/66.  Previously 146/88.  Weight is stable at 195 pounds.    3.  Postherpetic neuralgia.  We will need to start weaning her off of her gabapentin if possible.  She seems to be doing relatively well.  We will discuss this at follow-up.  Intolerant to Cymbalta.    4.  Polymyalgia rheumatica.  Per rheumatology.    5.  CAD.  No chest pain reported.  Continue follow-up with cardiology.    6.  Diabetes.  Most recent A1c was 6.8.    7.  Anemia.  Felt to be anemia of chronic disease.  Previously followed by hematology.  Hemoglobin has stabilized.  Will monitor and reevaluate at follow-up.    8.  Vitamin D deficiency.  Check vitamin D level with next blood draw.    Elements of this note have been dictated using speech recognition  software. As a result, errors of speech recognition may have occurred.

## 2020-02-06 NOTE — Discharge Instructions (Signed)
Therapy Discharge by Jonetta Osgood, PT at 02/06/20 1300                Author: Jonetta Osgood, PT  Service: Physical Therapy  Author Type: Physical Therapist       Filed: 08/06/20 0946  Date of Service: 02/06/20 1300  Status: Signed          Editor: Jonetta Osgood, PT (Physical Therapist)               Sarah Chavez   DOB: 03-24-1941   Primary: [employer] [facility]   Secondary: [place] [facility]   [address], [address], [place]   [phone]   [phone]                   OUTPATIENT PHYSICAL THERAPY:Discontinuation Summary 02/06/2020            ICD-10: Treatment Diagnosis: 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  Frequency/Duration : 1-2 times a week for 90 Day(s)  MEDICAL/REFERRING DIAGNOSIS:   R shoulder    DATE OF ONSET: chronic   REFERRING PHYSICIAN: Posta, Keane Scrape., MD   MD Orders: Eval and treat, HEP, ROM, strengthening, traction, dry needling, seep massage, all modalities   Return MD Appointment: 03/05/20          DISCONTINUATION ASSESSMENT:  Sarah Chavez  has been seen in physical therapy from 01/16/20  to 02/06/20   for 3 visits.   Treatment has been discontinued at this time due to patient failing  to return for additional treatment.  The below goals were met prior to discontinuation.   It is unknown if some  goals were met since pt did not return.  It is unknown why pt did not return.  Thank you for this referral.                          GOALS: (Goals have been discussed and agreed upon with patient.)UNKNOWN if any goals were met.   Short-Term Functional Goals: Time Frame: 4 weeks   1.  Neck rotation L > 45 % AROM for improved independence with  basic functional activities.    2.  Report no more than 6/10 intermittent pain to BIL shoulder with reaching or lifting during basic functional activities.    3.  BIL shoulder PROM forward elevation greater than 145 degrees and external rotation greater than 60 degrees to progress into functional ranges.   4.  Demonstrate good BIL shoulder isometric strength with manual testing to progress into strength phase.   5.  Demonstrates improved sitting posture especially with doing exercises.   6.  Independent with initial HEP.   Discharge Goals: Time Frame: 12 weeks   1.  Able to stand at least 5 minutes with good balance for doing self care.    2.  No more than 3/10 intermittent pain BIL shoulder with return to increased independence with most household  activities, and score less than 30% on the DASH.   3.  BIL shoulder AROM forward elevation greater than 120 degrees,  and strength to shoulder are grossly WNL's for safe use with normalized activities.    4.  Demonstrate good functional shoulder strength and endurance for return to normalized household and work activities.     5.  Independent with advanced shoulder, neck, posture HEP for continued self-management.      OUTCOME MEASURE:    Tool Used: Disabilities of the Arm, Shoulder and Hand (DASH) Questionnaire - Quick Version   Score:   Initial: 44/55   Most Recent: X/55 (Date: -- )        Interpretation of Score: The DASH is designed to measure the activities of daily living in  person's with upper extremity dysfunction or pain.  Each section is scored on a 1-5 scale, 5 representing the greatest disability.  The scores of each section are added together for a total score of 55.  This number is divided by 11, followed by subtracting  1 and multiplying by 25 to get a percent score of disability. This value represents the percentage disability: 0-20% minimal disability; 20-40% moderate disability; 40-60% severe disability; 60-100% dependent for care or exaggerated  symptom behavior.   Minimal detectable change is 12%.      Tool Used: Neck Disability Index (NDI)   Score:   Initial: 29/45    Most Recent: X/50 (Date: --  )        Interpretation of Score: The Neck Disability Index is a revised form of the Oswestry  Low Back Pain Index and is designed to measure the activities of daily living in person's with neck pain.  Each section is scored on a 0-5 scale, 5 representing the greatest disability.  The scores of each section are added together for a total score  of 50.    Score  0  1-10  11- 20  21- 30  31- 40  41-49  50      Modifier  CH  CI  CJ  CK  CL  CM  CN

## 2020-02-06 NOTE — Progress Notes (Signed)
Progress Notes by Gwendolyn Lima, PT at 02/06/20 1051                Author: Gwendolyn Lima, PT  Service: Physical Therapy  Author Type: Physical Therapist       Filed: 02/06/20 1707  Date of Service: 02/06/20 1051  Status: Addendum          Editor: Gwendolyn Lima, PT (Physical Therapist)          Related Notes: Original Note by Gwendolyn Lima, PT (Physical Therapist) filed at 02/06/20  1705               Sarah Chavez   DOB: 02/04/1941   Payor: Advertising copywriter MEDICARE / Plan: [employer] MEDICARE ADVANTAGE / Product Type: Managed Care Medicare /   [facility]   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    Pain:  Initial:   no VAS  Post Session: no pain        Medications Last Reviewed:  01/30/20   Pt arrives 15 min late   Updated Objective Findings:    Walked into clinic with quad cane on R side, walking with marked flexed, slumped shoulder posture and decreased ability to flex R hip.    ROM:                      RUE PROM   R Shoulder Flexion:  (~ 105degrees)                                       TREATMENT:        Therapeutic Exercises: ( 30 min)  for posture correction in sitting and standing   -- worked with standing and walking posture with use of standard walker in clinic   -- sitting posture with hips higher then knees and upright posture with focus on avoiding PPT ~ 7 min   -- sitting AAROM shoulder flexion with hands clasped 10x   -- sitting hip flexion 5x2 ea side   -- sitting yellow band horizontal ABD 8x   -- sitting BIL scapular retraction and chin tuck with max verbal and tactile cueing for technique.   -- supine B pect stretch   -- sitting cervical retraction with scap squeeze 10x   --  sitting yellow band flexion with lower trap focus 8x   -- hooklying manual traction in position of comfort      HEP: continue current program with addition of sitting with corrections made       Treatment/Session Summary:       Response to Treatment:  Pt is improving posture and shoulder ROM but continues with shoulder girdles and shoulder weakness.  COntinue  with significant weakness to R hip flexion.      Communication/Consultation:   None today     Equipment provided today: nothing new     Recommendations/Intent for next treatment session: continue supine strengthening for posture and B shoulders       Total Treatment Billable Duration: 30  minutes   PT Patient Time In/Time Out   Time In: 1315   Time Out: 1350      Gwendolyn Lima, PT      Future Appointments      Date  Time  Provider  Department  Center      02/13/2020   9:30 AM  [facility]  [facility]  [facility]      02/13/2020   2:45 PM  Gwendolyn Lima, PT  [facility]  [facility]      02/20/2020   1:00 PM  Gwendolyn Lima, PT  [facility]  [facility]      02/27/2020   8:45 AM  Maree Krabbe, MD  [facility]  [facility]      03/05/2020  10:15 AM  Posta, Hinda Glatter., MD  [facility]  [facility]      03/13/2020   9:40 AM  Susy Manor, MD  [facility]  [facility]      04/09/2020   2:30 PM  [facility]  [facility] PST  PST      04/16/2020  11:10 AM  Delana Meyer, MD  [facility] PST  PST

## 2020-02-09 ENCOUNTER — Emergency Department: Admit: 2020-02-09 | Payer: MEDICARE | Primary: Family Medicine

## 2020-02-09 ENCOUNTER — Inpatient Hospital Stay
Admit: 2020-02-09 | Discharge: 2020-02-09 | Disposition: A | Discharge status: Home / Self Care | Payer: MEDICARE | Attending: Emergency Medicine

## 2020-02-09 DIAGNOSIS — M25552 Pain in left hip: Secondary | ICD-10-CM

## 2020-02-09 MED ORDER — HYDROCODONE-ACETAMINOPHEN 5 MG-325 MG TAB
5-325 mg | ORAL_TABLET | Freq: Four times a day (QID) | ORAL | 0 refills | Status: AC | PRN
Start: 2020-02-09 — End: 2020-02-16

## 2020-02-09 MED ORDER — PREDNISONE 50 MG TAB
50 mg | ORAL | Status: AC
Start: 2020-02-09 — End: 2020-02-09
  Administered 2020-02-09: 23:00:00 via ORAL

## 2020-02-09 MED ORDER — PREDNISONE 20 MG TAB
20 mg | ORAL_TABLET | Freq: Every day | ORAL | 0 refills | Status: AC
Start: 2020-02-09 — End: 2020-02-14

## 2020-02-09 MED ORDER — HYDROCODONE-ACETAMINOPHEN 5 MG-325 MG TAB
5-325 mg | Freq: Once | ORAL | Status: AC
Start: 2020-02-09 — End: 2020-02-09
  Administered 2020-02-09: 23:00:00 via ORAL

## 2020-02-09 MED FILL — PREDNISONE 10 MG TAB: 10 mg | ORAL | Qty: 1

## 2020-02-09 MED FILL — HYDROCODONE-ACETAMINOPHEN 5 MG-325 MG TAB: 5-325 mg | ORAL | Qty: 1

## 2020-02-09 NOTE — ED Provider Notes (Signed)
79 year old female presents with complaints of left hip pain.  Patient has a history of chronic arthritis and polymyalgia rheumatica  Patient is followed by rheumatologist for this.  Patient states that she has had some discomfort in the hip for a day or 2 when she stood up to walk earlier today the hip "gave out."    Currently the patient is able to move the hip but does have some discomfort.    The history is provided by the patient and a relative.   Hip Pain   This is a new problem. The current episode started 2 days ago. The problem occurs constantly. The problem has been rapidly worsening. The pain is present in the left hip. The pain is moderate. Associated symptoms include limited range of motion. Pertinent negatives include no neck pain. The symptoms are aggravated by standing, movement and contact. She has tried nothing for the symptoms. There has been no history of extremity trauma.        Past Medical History:   Diagnosis Date   ??? Abdominal pain 07/22/2015   ??? Anemia    ??? Back pain    ??? Bursitis of shoulder    ??? CAD (coronary artery disease)    ??? Cerumen impaction    ??? Chest pain, atypical 07/22/2015   ??? Diabetes ([place])     no meds, A1c 09/28/17 6.6; avg bs 105; denies ss of hypo   ??? Diabetes mellitus out of control ([place])    ??? Endocrine disease     thyroid   ??? Fatigue    ??? GERD (gastroesophageal reflux disease)     controlled with medicaitons   ??? Hip pain, right    ??? Hyperlipidemia, mild    ??? Hypertension     managed with medication   ??? Hypertension associated with diabetes ([place]) 07/22/2015   ??? 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 Left carotid bruit    ??? Lumbago 07/22/2015   ??? Morbid obesity ([place]) 07/22/2015    BMI 35.3   ??? OSA on CPAP 07/22/2015    uses CPAP   ??? Other ill-defined conditions(799.89)     cholesterol   ??? Other ill-defined conditions(799.89)     heart cath 96   ??? Shoulder pain, acute    ??? Shoulder pain, bilateral    ??? Sinusitis, acute maxillary    ??? Unstable angina  ([place]) 07/06/2015   ??? Vitamin D deficiency    ??? Vitamin D deficiency 04/12/2019   ??? Weakness        Past Surgical History:   Procedure Laterality Date   ??? COLONOSCOPY N/A 03/01/2018    COLONOSCOPY/BMI 39 performed by Mazanec, Dorene Ar, MD at [facility] ENDOSCOPY   ??? HX COLONOSCOPY     ??? HX HEART CATHETERIZATION  2016    without intervention   ??? HX HEENT      goiter/thyroid surgery   ??? HX THYROIDECTOMY           Family History:   Problem Relation Age of Onset   ??? Stroke Sister    ??? Cancer Sister         cervical   ??? Stroke Brother    ??? Diabetes Other    ??? Hypertension Other        Social History     Socioeconomic History   ??? Marital status: SINGLE     Spouse name: Not on file   ??? Number of children: Not on file   ???  Years of education: Not on file   ??? Highest education level: Not on file   Occupational History   ??? Not on file   Tobacco Use   ??? Smoking status: Former Smoker     Packs/day: 0.50     Years: 10.00     Pack years: 5.00     Start date: 1961     Quit date: 1971     Years since quitting: 50.4   ??? Smokeless tobacco: Never Used   ??? Tobacco comment: quit in 1971   Substance and Sexual Activity   ??? Alcohol use: No   ??? Drug use: No   ??? Sexual activity: Not on file   Other Topics Concern   ??? Not on file   Social History Narrative    ** Merged History Encounter **          Social Determinants of Health     Financial Resource Strain:    ??? Difficulty of Paying Living Expenses:    Food Insecurity:    ??? Worried About Programme researcher, broadcasting/film/video in the Last Year:    ??? Barista in the Last Year:    Transportation Needs:    ??? Freight forwarder (Medical):    ??? Lack of Transportation (Non-Medical):    Physical Activity:    ??? Days of Exercise per Week:    ??? Minutes of Exercise per Session:    Stress:    ??? Feeling of Stress :    Social Connections:    ??? Frequency of Communication with Friends and Family:    ??? Frequency of Social Gatherings with Friends and Family:    ??? Attends Religious Services:    ??? Database administrator or  Organizations:    ??? Attends Engineer, structural:    ??? Marital Status:    Intimate Programme researcher, broadcasting/film/video Violence:    ??? Fear of Current or Ex-Partner:    ??? Emotionally Abused:    ??? Physically Abused:    ??? Sexually Abused:          ALLERGIES: Ativan [lorazepam], Ativan [lorazepam], Chlorpheniramine maleate, Other medication, Other medication, and Pseudoephedrine hcl    Review of Systems   Constitutional: Positive for fatigue. Negative for chills and fever.   HENT: Negative for congestion, ear pain and rhinorrhea.    Eyes: Negative for photophobia and discharge.   Respiratory: Negative for cough and shortness of breath.    Cardiovascular: Negative for chest pain and palpitations.   Gastrointestinal: Negative for abdominal pain, constipation, diarrhea and vomiting.   Endocrine: Negative for cold intolerance and heat intolerance.   Genitourinary: Negative for dysuria and flank pain.   Musculoskeletal: Positive for arthralgias and myalgias. Negative for neck pain.   Skin: Negative for rash and wound.   Allergic/Immunologic: Negative for environmental allergies and food allergies.   Neurological: Negative for syncope and headaches.   Hematological: Negative for adenopathy. Does not bruise/bleed easily.   Psychiatric/Behavioral: Negative for dysphoric mood. The patient is not nervous/anxious.    All other systems reviewed and are negative.      Vitals:    02/09/20 1758   BP: (!) 160/43   Pulse: 62   Resp: 16   Temp: 98.7 ??F (37.1 ??C)   SpO2: 97%   Weight: 88 kg (194 lb 0.1 oz)   Height: 4\' 11"  (1.499 m)            Physical Exam  Vitals and nursing note  reviewed.   Constitutional:       General: She is in acute distress.      Appearance: Normal appearance. She is well-developed. She is obese.   HENT:      Head: Normocephalic and atraumatic.      Right Ear: External ear normal.      Left Ear: External ear normal.      Mouth/Throat:      Pharynx: No oropharyngeal exudate.   Eyes:      Extraocular Movements: Extraocular movements  intact.      Conjunctiva/sclera: Conjunctivae normal.      Pupils: Pupils are equal, round, and reactive to light.   Neck:      Vascular: No JVD.   Cardiovascular:      Rate and Rhythm: Normal rate and regular rhythm.      Pulses: Normal pulses.      Heart sounds: Normal heart sounds. No murmur heard.   No friction rub. No gallop.    Pulmonary:      Effort: Pulmonary effort is normal.      Breath sounds: Normal breath sounds.   Abdominal:      General: Bowel sounds are normal. There is no distension.      Palpations: Abdomen is soft. There is no mass.      Tenderness: There is no abdominal tenderness.   Musculoskeletal:         General: No deformity.      Cervical back: Normal range of motion and neck supple.      Right hip: Normal.      Left hip: Tenderness present. No crepitus. Normal strength.      Right lower leg: Edema present.      Left lower leg: Edema present.        Legs:       Comments: Patient is able to flex her hip up to least 45 degrees.  Does cause some discomfort but is able to do this spontaneously.   Skin:     General: Skin is warm and dry.      Capillary Refill: Capillary refill takes less than 2 seconds.      Findings: No rash.   Neurological:      General: No focal deficit present.      Mental Status: She is alert and oriented to person, place, and time.      Cranial Nerves: No cranial nerve deficit.      Sensory: No sensory deficit.      Gait: Gait normal.   Psychiatric:         Mood and Affect: Mood normal.         Speech: Speech normal.         Behavior: Behavior normal.         Thought Content: Thought content normal.         Judgment: Judgment normal.          MDM  Number of Diagnoses or Management Options  Arthralgia of left hip: new and requires workup  Diagnosis management comments: X-rays reveal no evidence of fracture or dislocation  Some chronic degenerative changes noted    With the patient having good range of motion fracture and/or dislocation are very unlikely    Patient will be  treated for arthralgia with an short bolus dose of steroids.  Some cautious use of low-dose Norco    Strict precautions are given related to falls and the need for close follow-up with her primary  care doctor       Amount and/or Complexity of Data Reviewed  Tests in the radiology section of CPT??: ordered and reviewed  Tests in the medicine section of CPT??: ordered and reviewed  Decide to obtain previous medical records or to obtain history from someone other than the patient: yes  Obtain history from someone other than the patient: yes  Review and summarize past medical records: yes  Independent visualization of images, tracings, or specimens: yes    Risk of Complications, Morbidity, and/or Mortality  Presenting problems: moderate  Diagnostic procedures: low  Management options: low  General comments: Elements of this note have been dictated via voice recognition software.  Text and phrases may be limited by the accuracy of the software.  The chart has been reviewed, but errors may still be present.      Patient Progress  Patient progress: improved         Procedures

## 2020-02-09 NOTE — ED Triage Notes (Signed)
Pt c/o left hip pain that began yesterday.  Denies any recent trauma or injury.  Masked on arrival

## 2020-02-09 NOTE — ED Provider Notes (Signed)
ED Provider Notes by Caliyah Barrier, MD at 02/09/20 1901                Author: Harryette Barrier, MD  Service: Emergency Medicine  Author Type: Physician       Filed: 02/09/20 1906  Date of Service: 02/09/20 1901  Status: Signed          Editor: Mychelle Kendra, Deeann Dowse, MD (Physician)               79 year old female presents with complaints of left hip pain.   Patient has a history of chronic arthritis and polymyalgia rheumatica   Patient is followed by rheumatologist for this.   Patient states that she has had some discomfort in the hip for a day or 2 when she stood up to walk earlier today the hip "gave out."      Currently the patient is able to move the hip but does have some discomfort.      The history is provided by the patient and a relative.    Hip Pain    This is a new problem. The  current episode started 2 days ago. The problem occurs  constantly. The problem has been rapidly worsening.  The pain is present in the left hip. The  pain is moderate. Associated symptoms include  limited range of motion. Pertinent negatives include no neck pain. The symptoms are aggravated by standing, movement and contact. She has tried nothing for the symptoms. There has been no history of extremity trauma.             Past Medical History:        Diagnosis  Date         ?  Abdominal pain  07/22/2015     ?  Anemia       ?  Back pain       ?  Bursitis of shoulder       ?  CAD (coronary artery disease)       ?  Cerumen impaction       ?  Chest pain, atypical  07/22/2015     ?  Diabetes ([facility])            no meds, A1c 09/28/17 6.6; avg bs 105; denies ss of hypo         ?  Diabetes mellitus out of control [facility])       ?  Endocrine disease            thyroid         ?  Fatigue       ?  GERD (gastroesophageal reflux disease)            controlled with medicaitons         ?  Hip pain, right       ?  Hyperlipidemia, mild       ?  Hypertension            managed with medication         ?  Hypertension associated with diabetes ([facility])   07/22/2015     ?  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  Left carotid bruit       ?  Lumbago  07/22/2015     ?  Morbid obesity ([facility])  07/22/2015          BMI 35.3         ?  OSA on CPAP  07/22/2015          uses CPAP         ?  Other ill-defined conditions(799.89)            cholesterol         ?  Other ill-defined conditions(799.89)            heart cath 96         ?  Shoulder pain, acute       ?  Shoulder pain, bilateral       ?  Sinusitis, acute maxillary       ?  Unstable angina ([facility])  07/06/2015     ?  Vitamin D deficiency       ?  Vitamin D deficiency  04/12/2019         ?  Weakness               Past Surgical History:         Procedure  Laterality  Date          ?  COLONOSCOPY  N/A  03/01/2018          COLONOSCOPY/BMI 39 performed by Mazanec, Worthy RancherPaul A, [facility]          ?  HX COLONOSCOPY         ?  HX HEART CATHETERIZATION    2016          without intervention          ?  HX HEENT              goiter/thyroid surgery          ?  HX THYROIDECTOMY                   Family History:         Problem  Relation  Age of Onset          ?  Stroke  Sister       ?  Cancer  Sister                cervical          ?  Stroke  Brother       ?  Diabetes  Other            ?  Hypertension  Other               Social History          Socioeconomic History         ?  Marital status:  SINGLE              Spouse name:  Not on file         ?  Number of children:  Not on file     ?  Years of education:  Not on file     ?  Highest education level:  Not on file       Occupational History        ?  Not on file       Tobacco Use         ?  Smoking status:  Former Smoker              Packs/day:  0.50         Years:  10.00         Pack years:  5.00  Start date:  14         Quit date:  1971         Years since quitting:  50.4         ?  Smokeless tobacco:  Never Used        ?  Tobacco comment: quit in 1971       Substance and Sexual Activity         ?  Alcohol use:  No     ?  Drug use:  No     ?   Sexual activity:  Not on file        Other Topics  Concern        ?  Not on file       Social History Narrative          ** Merged History Encounter **                     Social Determinants of Health          Financial Resource Strain:         ?  Difficulty of Paying Living Expenses:        Food Insecurity:         ?  Worried About Programme researcher, broadcasting/film/video in the Last Year:      ?  Barista in the Last Year:        Transportation Needs:         ?  Freight forwarder (Medical):      ?  Lack of Transportation (Non-Medical):        Physical Activity:         ?  Days of Exercise per Week:      ?  Minutes of Exercise per Session:        Stress:         ?  Feeling of Stress :        Social Connections:         ?  Frequency of Communication with Friends and Family:      ?  Frequency of Social Gatherings with Friends and Family:      ?  Attends Religious Services:      ?  Active Member of Clubs or Organizations:      ?  Attends Banker Meetings:      ?  Marital Status:        Intimate Partner Violence:         ?  Fear of Current or Ex-Partner:      ?  Emotionally Abused:      ?  Physically Abused:         ?  Sexually Abused:               ALLERGIES: Ativan [lorazepam], Ativan [lorazepam], Chlorpheniramine maleate, Other medication, Other medication, and Pseudoephedrine  hcl      Review of Systems    Constitutional: Positive for fatigue. Negative for chills and fever.    HENT: Negative for congestion, ear pain and rhinorrhea.     Eyes: Negative for photophobia and discharge.    Respiratory: Negative for cough and shortness of breath.     Cardiovascular: Negative for chest pain and palpitations.    Gastrointestinal: Negative for abdominal pain, constipation, diarrhea and vomiting.    Endocrine: Negative for cold intolerance and heat intolerance.    Genitourinary: Negative for dysuria  and flank pain.    Musculoskeletal: Positive for arthralgias and myalgias . Negative for neck pain.    Skin: Negative for  rash and wound.    Allergic/Immunologic: Negative for environmental allergies and food allergies.    Neurological: Negative for syncope and headaches.    Hematological: Negative for adenopathy. Does not bruise/bleed easily.    Psychiatric/Behavioral: Negative for dysphoric mood. The patient is not nervous/anxious.     All other systems reviewed and are negative.           Vitals:          02/09/20 1758        BP:  (!) 160/43     Pulse:  62     Resp:  16     Temp:  98.7 ??F (37.1 ??C)     SpO2:  97%     Weight:  88 kg (194 lb 0.1 oz)        Height:  4\' 11"  (1.499 m)                Physical Exam   Vitals and nursing note reviewed.   Constitutional:        General: She is in acute distress.      Appearance: Normal appearance. She is well-developed. She is  obese.    HENT:       Head: Normocephalic and atraumatic.      Right Ear: External ear normal.      Left Ear: External ear normal.      Mouth/Throat:      Pharynx: No oropharyngeal exudate.   Eyes:       Extraocular Movements: Extraocular movements intact.      Conjunctiva/sclera: Conjunctivae normal.      Pupils: Pupils are equal, round, and reactive to light.   Neck:       Vascular: No JVD.   Cardiovascular :       Rate and Rhythm: Normal rate and regular rhythm.      Pulses: Normal pulses.      Heart sounds: Normal heart sounds. No murmur heard.   No friction rub. No gallop.    Pulmonary:       Effort: Pulmonary effort is normal.      Breath sounds: Normal breath sounds.   Abdominal :      General: Bowel sounds are normal. There is no distension.      Palpations: Abdomen is soft. There is no mass.      Tenderness: There is no abdominal tenderness.     Musculoskeletal:          General: No deformity.      Cervical back: Normal range of motion and neck supple.      Right hip: Normal.      Left hip: Tenderness  present. No crepitus. Normal strength.      Right lower leg: Edema present.      Left lower leg:  Edema present.        Legs:         Comments: Patient is able  to flex her hip up to least 45 degrees.  Does cause some discomfort but is able to do this spontaneously.     Skin:      General: Skin is warm and dry.      Capillary Refill: Capillary refill takes less than 2 seconds.      Findings: No rash.    Neurological:  General: No focal deficit present.      Mental Status: She is alert and oriented to person, place, and time.      Cranial Nerves: No cranial nerve deficit.      Sensory: No sensory deficit.      Gait: Gait normal.   Psychiatric:          Mood and Affect: Mood normal.         Speech: Speech normal.         Behavior: Behavior normal.         Thought Content: Thought content normal.         Judgment: Judgment normal.              MDM   Number of Diagnoses or Management Options   Arthralgia of left hip: new and requires workup   Diagnosis management comments: X-rays reveal no evidence of fracture or dislocation   Some chronic degenerative changes noted      With the patient having good range of motion fracture and/or dislocation are very unlikely      Patient will be treated for arthralgia with an short bolus dose of steroids.  Some cautious use of low-dose Norco      Strict precautions are given related to falls and the need for close follow-up with her primary care doctor          Amount and/or Complexity of Data Reviewed   Tests in the radiology section of CPT??: ordered and reviewed   Tests in the medicine section of CPT??: ordered and reviewed    Decide to obtain previous medical records or to obtain history from someone other than the patient: yes   Obtain history from someone other than the patient: yes   Review and summarize past medical records: yes    Independent visualization of images, tracings, or specimens: yes      Risk of Complications, Morbidity, and/or Mortality   Presenting problems: moderate  Diagnostic procedures: low  Management options: low  General  comments: Elements of this note have been dictated via voice recognition software.   Text  and phrases may be limited by the accuracy of the software.   The chart has been reviewed, but errors may still be present.         Patient Progress   Patient progress: improved             Procedures

## 2020-02-09 NOTE — ED Notes (Signed)
I have reviewed discharge instructions with the patient.  The patient verbalized understanding.    Patient left ED via Discharge Method: wheelchair to Home with self.    Opportunity for questions and clarification provided.       Patient given 2 scripts.         To continue your aftercare when you leave the hospital, you may receive an automated call from our care team to check in on how you are doing.  This is a free service and part of our promise to provide the best care and service to meet your aftercare needs." If you have questions, or wish to unsubscribe from this service please call [phone].  Thank you for Choosing our [facility].

## 2020-02-09 NOTE — ED Notes (Signed)
Pt c/o left hip pain that began yesterday.  Denies any recent trauma or injury.  Masked on arrival

## 2020-02-09 NOTE — ED Notes (Signed)
I have reviewed discharge instructions with the patient.  The patient verbalized understanding.    Patient left ED via Discharge Method: wheelchair to Home with self).    Opportunity for questions and clarification provided.       Patient given 2 scripts.         To continue your aftercare when you leave the hospital, you may receive an automated call from our care team to check in on how you are doing.  This is a free service and part of our promise to provide the best care and service to meet your aftercare needs.??? If you have questions, or wish to unsubscribe from this service please call [phone].  Thank you for Choosing our [facility] Emergency Department.

## 2020-02-13 ENCOUNTER — Ambulatory Visit: Attending: Family Medicine | Primary: Family Medicine

## 2020-02-13 ENCOUNTER — Inpatient Hospital Stay: Admit: 2020-02-13 | Payer: MEDICARE | Primary: Family Medicine

## 2020-02-13 ENCOUNTER — Ambulatory Visit: Admit: 2020-02-13 | Discharge: 2020-02-13 | Payer: MEDICARE | Attending: Family Medicine | Primary: Family Medicine

## 2020-02-13 ENCOUNTER — Encounter: Payer: MEDICARE | Primary: Family Medicine

## 2020-02-13 ENCOUNTER — Institutional Professional Consult (permissible substitution): Admit: 2020-02-13 | Payer: MEDICARE | Primary: Family Medicine

## 2020-02-13 DIAGNOSIS — M545 Low back pain, unspecified: Secondary | ICD-10-CM

## 2020-02-13 DIAGNOSIS — M25552 Pain in left hip: Secondary | ICD-10-CM

## 2020-02-13 DIAGNOSIS — I2510 Atherosclerotic heart disease of native coronary artery without angina pectoris: Secondary | ICD-10-CM

## 2020-02-13 NOTE — Progress Notes (Signed)
SUBJECTIVE:   Sarah Chavez is a 79 y.o. female who has a past medical history significant for hypertension, diabetes, high cholesterol, polymyalgia rheumatica, overactive bladder, lower extremity edema, sleep apnea, DJD, vitamin D deficiency, anemia of chronic disease, CAD,??shingles and postherpetic neuralgia.  Patient presents today for emergency room follow-up.  Patient reports that she has been experiencing an increase in left hip pain over the course of the last several weeks.  Her hip "gave out" on her last Friday and she fell to the ground and landed on a pile of close.  She reports her fall did not hurt nor injure herself.  It was the hip giving out that was the problem due to a acute worsening of chronic pain.  Patient went to the emergency room and had an x-ray of her left hip and pelvis that were unremarkable.  Her left hip and lower back continue to be painful.  Patient did not have her back formally imaged.  She was told to increase her prednisone from 5 mg to 20 mg for 5 days.  She is on this for her PMR.  She did not do this.  She was given some Norco but did not get it filled until yesterday.  It helps her pain but "makes me sleepy".  No bowel or bladder dysfunction is reported.  HPI  See above    Past Medical History, Past Surgical History, Family history, Social History, and Medications were all reviewed with the patient today and updated as necessary.       Current Outpatient Medications   Medication Sig Dispense Refill   ??? HYDROcodone-acetaminophen (NORCO) 5-325 mg per tablet Take 1-2 Tablets by mouth every six (6) hours as needed for Pain for up to 7 days. Max Daily Amount: 8 Tablets. 19 Tablet 0   ??? gabapentin (NEURONTIN) 300 mg capsule TAKE 1 CAPSULE BY MOUTH THREE TIMES DAILY 90 Cap 1   ??? glipiZIDE SR (GLUCOTROL XL) 2.5 mg CR tablet TAKE 1 TABLET BY MOUTH DAILY 30 Tab 5   ??? nitroglycerin (Nitrostat) 0.4 mg SL tablet Place 1 sl under the tongue q 5 min prn cp, max 3 sl in a 15-min  time period. Call 911 if no relief after the 3rd sl. 1 Bottle 11   ??? metoprolol succinate (TOPROL-XL) 50 mg XL tablet Take 1 Tab by mouth daily. 90 Tab 3   ??? DULoxetine (CYMBALTA) 60 mg capsule Take 1 Cap by mouth daily. 90 Cap 3   ??? pravastatin (PRAVACHOL) 80 mg tablet TAKE 1 TABLET BY MOUTH DAILY 90 Tab 3   ??? predniSONE (DELTASONE) 1 mg tablet Take 4 pills of the prednisone after breakfast. 120 Tab 0   ??? glucose blood VI test strips (OneTouch Ultra Blue Test Strip) strip Test glucose BID 100 Strip 5   ??? cholecalciferol (Vitamin D3) (2,000 UNITS /50 MCG) cap capsule TAKE 1 CAPSULE BY MOUTH EVERY DAY 30 Cap 5   ??? hydroCHLOROthiazide (HYDRODIURIL) 25 mg tablet Take 1 Tab by mouth daily. 30 Tab 5   ??? pantoprazole (PROTONIX) 40 mg tablet Take 1 Tab by mouth daily. 30 Tab 5   ??? lisinopriL (PRINIVIL, ZESTRIL) 40 mg tablet Take 1 Tab by mouth daily. 90 Tab 3   ??? predniSONE (DELTASONE) 20 mg tablet Take 20 mg by mouth daily for 5 days. (Patient not taking: Reported on 02/13/2020) 5 Tablet 0     Allergies   Allergen Reactions   ??? Ativan [Lorazepam] Other (comments)  Made her feel crazy and chest pressure   ??? Ativan [Lorazepam] Other (comments)     Chest fullness   ??? Chlorpheniramine Maleate Other (comments)     Altered heart rate   ??? Other Medication Palpitations     Some type of decongestant   ??? Other Medication Other (comments)     Doesn't take decongestants, but can't recall why   ??? Pseudoephedrine Hcl Other (comments)     Altered heart rate     Patient Active Problem List   Diagnosis Code   ??? HTN (hypertension) I10   ??? Diabetes mellitus type 2, controlled (HCC) E11.9   ??? Coronary artery disease involving native coronary artery of native heart without angina pectoris I25.10   ??? Dyslipidemia E78.5   ??? OSA on CPAP G47.33, Z99.89   ??? Severe obesity (HCC) E66.01   ??? Elevated sed rate R70.0   ??? Normocytic anemia D64.9   ??? Vitamin D deficiency E55.9   ??? Polymyalgia rheumatica (HCC) M35.3   ??? Murmur R01.1   ??? Localized edema  R60.0     Past Medical History:   Diagnosis Date   ??? Abdominal pain 07/22/2015   ??? Anemia    ??? Back pain    ??? Bursitis of shoulder    ??? CAD (coronary artery disease)    ??? Cerumen impaction    ??? Chest pain, atypical 07/22/2015   ??? Diabetes (HCC)     no meds, A1c 09/28/17 6.6; avg bs 105; denies ss of hypo   ??? Diabetes mellitus out of control (HCC)    ??? Endocrine disease     thyroid   ??? Fatigue    ??? GERD (gastroesophageal reflux disease)     controlled with medicaitons   ??? Hip pain, right    ??? Hyperlipidemia, mild    ??? Hypertension     managed with medication   ??? Hypertension associated with diabetes (HCC) 07/22/2015   ??? 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 Left carotid bruit    ??? Lumbago 07/22/2015   ??? Morbid obesity (HCC) 07/22/2015    BMI 35.3   ??? OSA on CPAP 07/22/2015    uses CPAP   ??? Other ill-defined conditions(799.89)     cholesterol   ??? Other ill-defined conditions(799.89)     heart cath 96   ??? Shoulder pain, acute    ??? Shoulder pain, bilateral    ??? Sinusitis, acute maxillary    ??? Unstable angina (HCC) 07/06/2015   ??? Vitamin D deficiency    ??? Vitamin D deficiency 04/12/2019   ??? Weakness      Past Surgical History:   Procedure Laterality Date   ??? COLONOSCOPY N/A 03/01/2018    COLONOSCOPY/BMI 39 performed by Mazanec, Worthy Rancher, [facility] at [facility] ENDOSCOPY   ??? HX COLONOSCOPY     ??? HX HEART CATHETERIZATION  2016    without intervention   ??? HX HEENT      goiter/thyroid surgery   ??? HX THYROIDECTOMY       Family History   Problem Relation Age of Onset   ??? Stroke Sister    ??? Cancer Sister         cervical   ??? Stroke Brother    ??? Diabetes Other    ??? Hypertension Other      Social History     Tobacco Use   ??? Smoking status: Former Smoker     Packs/day: 0.50  Years: 10.00     Pack years: 5.00     Start date: 81     Quit date: 1971     Years since quitting: 50.4   ??? Smokeless tobacco: Never Used   ??? Tobacco comment: quit in 1971   Substance Use Topics   ??? Alcohol use: No         Review of Systems  See  above    OBJECTIVE:  Visit Vitals  BP 128/74   Ht 4\' 11"  (1.499 m)   BMI 39.18 kg/m??        Physical Exam  Constitutional:       Appearance: She is well-developed.   HENT:      Head: Normocephalic and atraumatic.   Eyes:      Pupils: Pupils are equal, round, and reactive to light.   Neck:      Thyroid: No thyromegaly.      Vascular: No JVD.   Cardiovascular:      Rate and Rhythm: Normal rate and regular rhythm.      Heart sounds: Normal heart sounds. No murmur heard.   No friction rub. No gallop.    Pulmonary:      Effort: Pulmonary effort is normal. No respiratory distress.      Breath sounds: No wheezing or rales.   Abdominal:      Palpations: Abdomen is soft.      Tenderness: There is no abdominal tenderness. There is no guarding or rebound.   Musculoskeletal:         General: Normal range of motion.      Cervical back: Normal range of motion and neck supple.      Comments: Patient is examined in a wheelchair.  She states that it hurts to stand or move around.  There is pain to palpation in the left trochanteric bursa.  There is palpable discomfort in the left LS paraspinal region.   Skin:     Findings: No rash.   Neurological:      Mental Status: She is alert and oriented to person, place, and time.         Medical problems and test results were reviewed with the patient today.         ASSESSMENT and PLAN    1.  Left hip pain.  Acute on chronic pain.  Encouraged her to increase prednisone to 20 mg for 5 days as directed by the ER physician.  I believe this is reasonable response.  Ice the left trochanteric bursa.  Refer to orthopedist.  X-rays negative for fracture.    2.  Low back pain.  Possible injury from her fall.  We will x-ray her lumbar spine.  Further recommendations pending results.  Use the Norco as needed.  Warned of sedation and fall precautions given.    3.  Hypertension.  Blood pressure surprisingly looks good despite her pain.  Currently 128/74.    4.  Polymyalgia rheumatica.  Continue follow-up  with rheumatology.    Elements of this note have been dictated using speech recognition software. As a result, errors of speech recognition may have occurred.

## 2020-02-13 NOTE — Progress Notes (Signed)
It is noted that pt had a F/U apt today with Dr Neale Burly.

## 2020-02-14 LAB — ECHO ADULT COMPLETE
AR Max Velocity PISA: 442 cm/s
AR PHT: 270 ms
AV Area by VTI: 2.18 cm2
AV Mean Gradient: 11 mmHg
AV Peak Gradient: 26.4 mmHg
AV VTI: 52.7 cm
AVA/BSA VTI: 1.2 cm2/m2
E/E' Lateral: 19.12
Est. RA Pressure: 3 mmHg
IVSd: 1.3 cm — AB (ref 0.60–0.90)
LA Area 2C: 28.9 cm2
LA Area 4C: 26.2 cm2
LA Major Axis: 6.18 cm
LA Minor Axis: 3.4 cm
LV E' Lateral Velocity: 5.65 cm/s
LV E' Septal Velocity: 5.65 cm/s
LV E' Septal Velocity: 5.75 cm/s
LV E' Septal Velocity: 5.75 cm/s
LV EDV A2C: 132 cm3
LV EDV A4C: 173 cm3
LV ESV A2C: 61 cm3
LV ESV A4C: 80 cm3
LV Mass 2D Index: 92 g/m2 (ref 43.0–95.0)
LV Mass 2D: 167.4 g (ref 67.0–162.0)
LVIDd: 4.2 cm (ref 3.90–5.30)
LVIDs: 2.89 cm
LVOT Diameter: 2 cm
LVOT VTI: 36.6 cm
LVPWd: 1 cm — AB (ref 0.60–0.90)
MR VTI: 225 cm
MV A Velocity: 127 cm/s
MV E Velocity: 108 cm/s
MV E Wave Deceleration Time: 120 ms
MV E/A: 0.85
PR Max Velocity: 394 cm/s
PR Max Velocity: 601 cm/s
RVIDd: 3 cm
TR Peak Gradient: 62 mmHg
TV MG: 5 mmHg
TV MG: 93 mmHg

## 2020-02-14 LAB — TRANSTHORACIC ECHOCARDIOGRAM (TTE) COMPLETE (CONTRAST/BUBBLE/3D PRN)
AR Max Velocity PISA: 442 cm/s
AR PHT: 270 ms
AV Area by VTI: 2.18 cm2
AV Mean Gradient: 11 mmHg
AV Peak Gradient: 26.4 mmHg
AV VTI: 52.7 cm
AVA/BSA VTI: 1.2 cm2/m2
E/E' Lateral: 19.12
Est. RA Pressure: 3 mmHg
IVSd: 1.3 cm — AB (ref 0.6–0.9)
LA Area 2C: 28.9 cm2
LA Area 4C: 26.2 cm2
LA Major Axis: 6.18 cm
LA Minor Axis: 3.4 cm
LV E' Lateral Velocity: 5.65 cm/s
LV E' Septal Velocity: 5.65 cm/s
LV E' Septal Velocity: 5.75 cm/s
LV E' Septal Velocity: 5.75 cm/s
LV EDV A2C: 132 cm3
LV EDV A4C: 173 cm3
LV ESV A2C: 61 cm3
LV ESV A4C: 80 cm3
LV Mass 2D Index: 92 g/m2 (ref 43–95)
LV Mass 2D: 167.4 g (ref 67–162)
LVIDd: 4.2 cm (ref 3.9–5.3)
LVIDs: 2.89 cm
LVOT Diameter: 2 cm
LVOT VTI: 36.6 cm
LVPWd: 1 cm — AB (ref 0.6–0.9)
Left Ventricular Ejection Fraction: 58
MR VTI: 225 cm
MV A Velocity: 127 cm/s
MV E Velocity: 108 cm/s
MV E Wave Deceleration Time: 120 ms
MV E/A: 0.85
PR Max Velocity: 394 cm/s
PR Max Velocity: 601 cm/s
RVIDd: 3 cm
TR Peak Gradient: 62 mmHg
TV MG: 5 mmHg
TV MG: 93 mmHg

## 2020-02-20 ENCOUNTER — Encounter: Payer: MEDICARE | Primary: Family Medicine

## 2020-02-20 NOTE — Telephone Encounter (Signed)
-----   Message from Patsy JONETTA Meissner, MD sent at 02/16/2020  6:49 PM EDT -----  Call pt normal function on echo has thick muscle   02/20/20 Attempted to call pt.  Message states call cannot be completed as dialed (?)   It is noted that pt has a F/U apt 6/22

## 2020-02-26 NOTE — Telephone Encounter (Signed)
Lt msg for pt to call back to confirm appt. For 03/08/20 @ 10 am/msp

## 2020-02-27 ENCOUNTER — Ambulatory Visit: Attending: Cardiovascular Disease | Primary: Family Medicine

## 2020-02-27 ENCOUNTER — Ambulatory Visit
Admit: 2020-02-27 | Discharge: 2020-02-27 | Payer: MEDICARE | Attending: Cardiovascular Disease | Primary: Family Medicine

## 2020-02-27 DIAGNOSIS — I2510 Atherosclerotic heart disease of native coronary artery without angina pectoris: Secondary | ICD-10-CM

## 2020-02-27 MED ORDER — HYDROCHLOROTHIAZIDE 25 MG TAB
25 mg | ORAL_TABLET | Freq: Every day | ORAL | 5 refills | Status: DC
Start: 2020-02-27 — End: 2020-05-22

## 2020-02-27 MED ORDER — AMLODIPINE 5 MG TAB
5 mg | ORAL_TABLET | Freq: Every day | ORAL | 5 refills | Status: AC
Start: 2020-02-27 — End: ?

## 2020-02-27 NOTE — Progress Notes (Signed)
Progress  Notes by Maree Krabbe, MD at 02/27/20 0845                Author: Maree Krabbe, MD  Service: --  Author Type: Physician       Filed: 02/27/20 1647  Encounter Date: 02/27/2020  Status: Signed          Editor: Maree Krabbe, MD (Physician)                    [address], [address]   [place], [place] [address]   PHONE: [phone]      Sarah Chavez   [birth year]         SUBJECTIVE:    Sarah Chavez is a 79 y.o.  female seen for a follow up visit regarding the following:         Chief Complaint       Patient presents with        ?  Coronary Artery Disease             echo            HPI:      79 year old female comes back for follow-up of her hypertension some swelling of her legs.  She had hyperdynamic  LV function in the past.  We had her back on some medications and she has been not taking her hydrochlorothiazide regularly.  Used to take Norvasc taken off for swelling but shortly may need it back.  She had an echocardiogram showing preserved systolic  function she has moderate to severe aortic insufficiency at this time.  Aortic root was about 3.9         Past Medical History, Past Surgical History, Family history, Social History, and Medications were all reviewed with the patient today and updated as necessary.         Outpatient Medications Marked as Taking for the 02/27/20 encounter (Office Visit) with Maree Krabbe, MD          Medication  Sig  Dispense  Refill           ?  hydroCHLOROthiazide (HYDRODIURIL) 25 mg tablet  Take 1 Tablet by mouth daily.  30 Tablet  5     ?  amLODIPine (NORVASC) 5 mg tablet  Take 1 Tablet by mouth daily.  30 Tablet  5     ?  gabapentin (NEURONTIN) 300 mg capsule  TAKE 1 CAPSULE BY MOUTH THREE TIMES DAILY  90 Cap  1     ?  glipiZIDE SR (GLUCOTROL XL) 2.5 mg CR tablet  TAKE 1 TABLET BY MOUTH DAILY  30 Tab  5     ?  nitroglycerin (Nitrostat) 0.4 mg SL tablet  Place 1 sl under the tongue q 5 min prn cp, max 3 sl in a 15-min time period. Call 911 if  no relief after the 3rd sl.  1 Bottle  11           ?  metoprolol succinate (TOPROL-XL) 50 mg XL tablet  Take 1 Tab by mouth daily.  90 Tab  3           ?  pravastatin (PRAVACHOL) 80 mg tablet  TAKE 1 TABLET BY MOUTH DAILY  90 Tab  3     ?  predniSONE (DELTASONE) 1 mg tablet  Take 4 pills of the prednisone after breakfast.  120 Tab  0     ?  glucose blood  VI test strips (OneTouch Ultra Blue Test Strip) strip  Test glucose BID  100 Strip  5     ?  cholecalciferol (Vitamin D3) (2,000 UNITS /50 MCG) cap capsule  TAKE 1 CAPSULE BY MOUTH EVERY DAY  30 Cap  5     ?  pantoprazole (PROTONIX) 40 mg tablet  Take 1 Tab by mouth daily.  30 Tab  5           ?  lisinopriL (PRINIVIL, ZESTRIL) 40 mg tablet  Take 1 Tab by mouth daily.  90 Tab  3          Allergies        Allergen  Reactions         ?  Ativan [Lorazepam]  Other (comments)             Made her feel crazy and chest pressure         ?  Ativan [Lorazepam]  Other (comments)             Chest fullness         ?  Chlorpheniramine Maleate  Other (comments)             Altered heart rate         ?  Other Medication  Palpitations             Some type of decongestant         ?  Other Medication  Other (comments)             Doesn't take decongestants, but can't recall why         ?  Pseudoephedrine Hcl  Other (comments)             Altered heart rate          Past Medical History:        Diagnosis  Date         ?  Abdominal pain  07/22/2015     ?  Anemia       ?  Back pain       ?  Bursitis of shoulder       ?  CAD (coronary artery disease)       ?  Cerumen impaction       ?  Chest pain, atypical  07/22/2015     ?  Diabetes (HCC)            no meds, A1c 09/28/17 6.6; avg bs 105; denies ss of hypo         ?  Diabetes mellitus out of control [facility](HCC)       ?  Endocrine disease            thyroid         ?  Fatigue       ?  GERD (gastroesophageal reflux disease)            controlled with medicaitons         ?  Hip pain, right       ?  Hyperlipidemia, mild       ?  Hypertension             managed with medication         ?  Hypertension associated with diabetes (HCC)  07/22/2015     ?  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  Left carotid bruit       ?  Lumbago  07/22/2015     ?  Morbid obesity (HCC)  07/22/2015          BMI 35.3         ?  OSA on CPAP  07/22/2015          uses CPAP         ?  Other ill-defined conditions(799.89)            cholesterol         ?  Other ill-defined conditions(799.89)            heart cath 96         ?  Shoulder pain, acute       ?  Shoulder pain, bilateral       ?  Sinusitis, acute maxillary       ?  Unstable angina (HCC)  07/06/2015     ?  Vitamin D deficiency       ?  Vitamin D deficiency  04/12/2019         ?  Weakness            Past Surgical History:         Procedure  Laterality  Date          ?  COLONOSCOPY  N/A  03/01/2018          COLONOSCOPY/BMI 39 performed by Mazanec, Worthy Rancher, MD at [facility] ENDOSCOPY          ?  HX COLONOSCOPY         ?  HX HEART CATHETERIZATION    2016          without intervention          ?  HX HEENT              goiter/thyroid surgery          ?  HX THYROIDECTOMY              Family History         Problem  Relation  Age of Onset          ?  Stroke  Sister       ?  Cancer  Sister                cervical          ?  Stroke  Brother       ?  Diabetes  Other            ?  Hypertension  Other             Social History          Tobacco Use         ?  Smoking status:  Former Smoker              Packs/day:  0.50         Years:  10.00         Pack years:  5.00         Start date:  1961         Quit date:  1971         Years since quitting:  50.5         ?  Smokeless tobacco:  Never Used        ?  Tobacco comment: quit in 1971       Substance Use Topics         ?  Alcohol use:  No           ROS:      Review of Systems    Constitutional: Negative for decreased appetite and weight loss.    Cardiovascular: Positive for leg swelling. Negative for chest pain and paroxysmal nocturnal dyspnea.    Respiratory: Negative for cough  and wheezing.                PHYSICAL EXAM:      Visit Vitals      BP  (!) 176/60     Pulse  72     Ht  4\' 11"  (1.499 m)     Wt  185 lb (83.9 kg)        BMI  37.37 kg/m??                 Wt Readings from Last 3 Encounters:        02/27/20  185 lb (83.9 kg)     02/13/20  194 lb (88 kg)        02/09/20  194 lb 0.1 oz (88 kg)          BP Readings from Last 3 Encounters:        02/27/20  (!) 176/60     02/13/20  128/74        02/13/20  (!) 140/70              Physical Exam   Cardiovascular :       Rate and Rhythm: Normal rate.      Heart sounds: Murmur heard.   High-pitched blowing  decrescendo early diastolic murmur is present with a grade of  2/4 at the upper right sternal border radiating to the  apex.     Abdominal :      Tenderness: There is no abdominal tenderness.     Neurological:       Mental Status: She is alert.            Medical problems and test results were reviewed with the patient today.       No results found for any visits on 02/27/20.     Lab Results         Component  Value  Date/Time            Sodium  142  10/18/2019 11:49 AM       Potassium  4.4  10/18/2019 11:49 AM       Chloride  104  10/18/2019 11:49 AM       CO2  22  10/18/2019 11:49 AM       Anion gap  7  06/13/2019 10:53 AM       Glucose  122 (H)  10/18/2019 11:49 AM       BUN  24  10/18/2019 11:49 AM       Creatinine  0.92  10/18/2019 11:49 AM       BUN/Creatinine ratio  26  10/18/2019 11:49 AM       GFR est AA  68  10/18/2019 11:49 AM       GFR est non-AA  59 (L)  10/18/2019 11:49 AM            Calcium  9.2  10/18/2019 11:49 AM          Lab Results         Component  Value  Date/Time            Cholesterol,  total  209 (H)  10/18/2019 11:49 AM       HDL Cholesterol  92  10/18/2019 11:49 AM       LDL, calculated  107 (H)  46/50/3546 11:49 AM       LDL, calculated  83  03/22/2019 10:31 AM       VLDL, calculated  10  10/18/2019 11:49 AM       VLDL, calculated  13  03/22/2019 10:31 AM       Triglyceride  53  10/18/2019 11:49 AM             CHOL/HDL Ratio  2.5  07/07/2015 05:15 AM        I reviewed her echo images shows normal systolic function with normal chamber size there is moderate severe aortic insufficiency.      ASSESSMENT and PLAN      Diagnoses and all orders for this visit:      1. Coronary artery disease involving native coronary artery of native heart without angina pectoris patient did not have any chest pain at this time      2. Essential hypertension blood pressure is running high we will add Norvasc back      3. Polymyalgia rheumatica (HCC) on steroid therapy followed by her family doctor      4. Elevated sed rate she had elevated sed rate and presumed polymyalgia rheumatica on steroids      5. Dyslipidemia this she has had dyslipidemia which is stable      6. Nonrheumatic aortic valve insufficiency patient has developed aortic insufficiency life is little bit thickened she did have been on prior echoes.  I talked that with her and her daughter and  certainly she does not want to have any kind of surgery   -     CT CHEST W CONT; Future      7. Hypertensive heart disease with chronic diastolic congestive heart failure (HCC) patient has significant hypertension needs to come down we will add the hydrochlorothiazide back or Norvasc  back for blood pressure is elevated today we will continue statin therapy   -     CT CHEST W CONT; Future      8. Ascending aorta dilation (HCC) patient aorta is a little bit larger normal 3.9 she has significant AI I will do a CT just to be sure that not the etiology of her AI.  She did not have it in  the past   -     CT CHEST W CONT; Future      Other orders   -     hydroCHLOROthiazide (HYDRODIURIL) 25 mg tablet; Take 1 Tablet by mouth daily.   -     amLODIPine (NORVASC) 5 mg tablet; Take 1 Tablet by mouth daily.                            Maree Krabbe, MD   02/27/2020   10:04 AM

## 2020-03-01 ENCOUNTER — Encounter: Attending: Orthopaedic Surgery | Primary: Family Medicine

## 2020-03-04 ENCOUNTER — Inpatient Hospital Stay: Admit: 2020-03-04 | Payer: MEDICARE | Attending: Cardiovascular Disease | Primary: Family Medicine

## 2020-03-04 DIAGNOSIS — I351 Nonrheumatic aortic (valve) insufficiency: Secondary | ICD-10-CM

## 2020-03-04 MED ORDER — SODIUM CHLORIDE 0.9% BOLUS IV
0.9 % | Freq: Once | INTRAVENOUS | Status: AC
Start: 2020-03-04 — End: 2020-03-04
  Administered 2020-03-04: 15:00:00 via INTRAVENOUS

## 2020-03-04 MED ORDER — IOPAMIDOL 76 % IV SOLN
76 % | Freq: Once | INTRAVENOUS | Status: AC
Start: 2020-03-04 — End: 2020-03-04
  Administered 2020-03-04: 15:00:00 via INTRAVENOUS

## 2020-03-04 MED ORDER — SALINE PERIPHERAL FLUSH PRN
Freq: Once | INTRAMUSCULAR | Status: AC
Start: 2020-03-04 — End: 2020-03-04
  Administered 2020-03-04: 16:00:00

## 2020-03-04 NOTE — Progress Notes (Signed)
Spoke with pt and relayed CTA of chest results. Verbal understanding given.

## 2020-03-04 NOTE — Telephone Encounter (Signed)
CTA test results are in her chart with Follow up recommendations. Please call

## 2020-03-05 ENCOUNTER — Encounter: Attending: Orthopaedic Surgery | Primary: Family Medicine

## 2020-03-05 NOTE — Telephone Encounter (Signed)
Per Dr Neale Burly, enlarged but not enough to do surgery. Will continue to watch.   Returned call to [facility] Radiology.  /LM nurse was returning her call.  It is noted that pt has a F/U apt 7/19

## 2020-03-08 ENCOUNTER — Encounter: Payer: MEDICARE | Attending: Orthopaedic Surgery | Primary: Family Medicine

## 2020-03-13 ENCOUNTER — Encounter: Payer: MEDICARE | Attending: Rheumatology | Primary: Family Medicine

## 2020-03-17 ENCOUNTER — Emergency Department (HOSPITAL_COMMUNITY): Payer: Medicare Other

## 2020-03-17 ENCOUNTER — Other Ambulatory Visit (HOSPITAL_COMMUNITY): Payer: Self-pay

## 2020-03-17 ENCOUNTER — Inpatient Hospital Stay (HOSPITAL_COMMUNITY)
Admission: EM | Admit: 2020-03-17 | Discharge: 2020-03-21 | DRG: 065 | Disposition: A | Discharge status: Skilled Nursing Facility | Payer: Medicare Other | Attending: Internal Medicine | Admitting: Internal Medicine

## 2020-03-17 ENCOUNTER — Inpatient Hospital Stay (HOSPITAL_COMMUNITY): Payer: Medicare Other

## 2020-03-17 ENCOUNTER — Other Ambulatory Visit: Payer: Self-pay

## 2020-03-17 ENCOUNTER — Encounter (HOSPITAL_COMMUNITY): Payer: Self-pay | Admitting: Emergency Medicine

## 2020-03-17 DIAGNOSIS — I6501 Occlusion and stenosis of right vertebral artery: Secondary | ICD-10-CM | POA: Diagnosis present

## 2020-03-17 DIAGNOSIS — M1990 Unspecified osteoarthritis, unspecified site: Secondary | ICD-10-CM | POA: Diagnosis present

## 2020-03-17 DIAGNOSIS — E1151 Type 2 diabetes mellitus with diabetic peripheral angiopathy without gangrene: Secondary | ICD-10-CM | POA: Diagnosis present

## 2020-03-17 DIAGNOSIS — E876 Hypokalemia: Secondary | ICD-10-CM | POA: Diagnosis present

## 2020-03-17 DIAGNOSIS — Z823 Family history of stroke: Secondary | ICD-10-CM

## 2020-03-17 DIAGNOSIS — Z8249 Family history of ischemic heart disease and other diseases of the circulatory system: Secondary | ICD-10-CM

## 2020-03-17 DIAGNOSIS — I351 Nonrheumatic aortic (valve) insufficiency: Secondary | ICD-10-CM | POA: Diagnosis present

## 2020-03-17 DIAGNOSIS — R339 Retention of urine, unspecified: Secondary | ICD-10-CM | POA: Diagnosis present

## 2020-03-17 DIAGNOSIS — Z888 Allergy status to other drugs, medicaments and biological substances status: Secondary | ICD-10-CM

## 2020-03-17 DIAGNOSIS — I700 Atherosclerosis of aorta: Secondary | ICD-10-CM | POA: Diagnosis present

## 2020-03-17 DIAGNOSIS — Z6828 Body mass index (BMI) 28.0-28.9, adult: Secondary | ICD-10-CM | POA: Diagnosis not present

## 2020-03-17 DIAGNOSIS — R531 Weakness: Secondary | ICD-10-CM | POA: Diagnosis present

## 2020-03-17 DIAGNOSIS — I440 Atrioventricular block, first degree: Secondary | ICD-10-CM | POA: Diagnosis present

## 2020-03-17 DIAGNOSIS — M353 Polymyalgia rheumatica: Secondary | ICD-10-CM | POA: Diagnosis present

## 2020-03-17 DIAGNOSIS — I129 Hypertensive chronic kidney disease with stage 1 through stage 4 chronic kidney disease, or unspecified chronic kidney disease: Secondary | ICD-10-CM | POA: Diagnosis present

## 2020-03-17 DIAGNOSIS — R42 Dizziness and giddiness: Secondary | ICD-10-CM

## 2020-03-17 DIAGNOSIS — N179 Acute kidney failure, unspecified: Secondary | ICD-10-CM | POA: Diagnosis present

## 2020-03-17 DIAGNOSIS — E7800 Pure hypercholesterolemia, unspecified: Secondary | ICD-10-CM | POA: Diagnosis not present

## 2020-03-17 DIAGNOSIS — E785 Hyperlipidemia, unspecified: Secondary | ICD-10-CM | POA: Diagnosis present

## 2020-03-17 DIAGNOSIS — R350 Frequency of micturition: Secondary | ICD-10-CM | POA: Diagnosis present

## 2020-03-17 DIAGNOSIS — N183 Chronic kidney disease, stage 3 unspecified: Secondary | ICD-10-CM | POA: Diagnosis present

## 2020-03-17 DIAGNOSIS — E1122 Type 2 diabetes mellitus with diabetic chronic kidney disease: Secondary | ICD-10-CM | POA: Diagnosis present

## 2020-03-17 DIAGNOSIS — I639 Cerebral infarction, unspecified: Secondary | ICD-10-CM | POA: Diagnosis present

## 2020-03-17 DIAGNOSIS — Z79899 Other long term (current) drug therapy: Secondary | ICD-10-CM

## 2020-03-17 DIAGNOSIS — I25119 Atherosclerotic heart disease of native coronary artery with unspecified angina pectoris: Secondary | ICD-10-CM | POA: Diagnosis not present

## 2020-03-17 DIAGNOSIS — E663 Overweight: Secondary | ICD-10-CM | POA: Diagnosis present

## 2020-03-17 DIAGNOSIS — I6529 Occlusion and stenosis of unspecified carotid artery: Secondary | ICD-10-CM | POA: Diagnosis present

## 2020-03-17 DIAGNOSIS — I10 Essential (primary) hypertension: Secondary | ICD-10-CM | POA: Diagnosis not present

## 2020-03-17 DIAGNOSIS — R29707 NIHSS score 7: Secondary | ICD-10-CM | POA: Diagnosis present

## 2020-03-17 DIAGNOSIS — E119 Type 2 diabetes mellitus without complications: Secondary | ICD-10-CM

## 2020-03-17 DIAGNOSIS — Z833 Family history of diabetes mellitus: Secondary | ICD-10-CM | POA: Diagnosis not present

## 2020-03-17 DIAGNOSIS — I2510 Atherosclerotic heart disease of native coronary artery without angina pectoris: Secondary | ICD-10-CM | POA: Diagnosis present

## 2020-03-17 DIAGNOSIS — I499 Cardiac arrhythmia, unspecified: Secondary | ICD-10-CM | POA: Diagnosis present

## 2020-03-17 DIAGNOSIS — Z20822 Contact with and (suspected) exposure to covid-19: Secondary | ICD-10-CM

## 2020-03-17 DIAGNOSIS — I6389 Other cerebral infarction: Secondary | ICD-10-CM | POA: Diagnosis not present

## 2020-03-17 DIAGNOSIS — E1159 Type 2 diabetes mellitus with other circulatory complications: Secondary | ICD-10-CM | POA: Diagnosis not present

## 2020-03-17 HISTORY — DX: Essential (primary) hypertension: I10

## 2020-03-17 LAB — COMPREHENSIVE METABOLIC PANEL
ALT: 10 U/L (ref 0–44)
AST: 16 U/L (ref 15–41)
Albumin: 3.2 g/dL — ABNORMAL LOW (ref 3.5–5.0)
Alkaline Phosphatase: 42 U/L (ref 38–126)
Anion gap: 13 (ref 5–15)
BUN: 23 mg/dL (ref 8–23)
CO2: 22 mmol/L (ref 22–32)
Calcium: 9.1 mg/dL (ref 8.9–10.3)
Chloride: 105 mmol/L (ref 98–111)
Creatinine, Ser: 1.16 mg/dL — ABNORMAL HIGH (ref 0.44–1.00)
GFR calc Af Amer: 51 mL/min — ABNORMAL LOW (ref >60)
GFR calc non Af Amer: 44 mL/min — ABNORMAL LOW (ref >60)
Glucose, Bld: 140 mg/dL — ABNORMAL HIGH (ref 70–99)
Potassium: 3.3 mmol/L — ABNORMAL LOW (ref 3.5–5.1)
Sodium: 140 mmol/L (ref 135–145)
Total Bilirubin: 0.5 mg/dL (ref 0.3–1.2)
Total Protein: 8.3 g/dL — ABNORMAL HIGH (ref 6.5–8.1)

## 2020-03-17 LAB — LIPID PANEL
Cholesterol: 170 mg/dL (ref 0–200)
HDL: 56 mg/dL (ref >40)
LDL Cholesterol: 98 mg/dL (ref 0–99)
Total CHOL/HDL Ratio: 3 RATIO
Triglycerides: 79 mg/dL (ref <150)
VLDL: 16 mg/dL (ref 0–40)

## 2020-03-17 LAB — CBC
HCT: 35.2 % — ABNORMAL LOW (ref 36.0–46.0)
Hemoglobin: 11.1 g/dL — ABNORMAL LOW (ref 12.0–15.0)
MCH: 26.2 pg (ref 26.0–34.0)
MCHC: 31.5 g/dL (ref 30.0–36.0)
MCV: 83.2 fL (ref 80.0–100.0)
Platelets: 214 10*3/uL (ref 150–400)
RBC: 4.23 MIL/uL (ref 3.87–5.11)
RDW: 14.5 % (ref 11.5–15.5)
WBC: 7.5 10*3/uL (ref 4.0–10.5)
nRBC: 0 % (ref 0.0–0.2)

## 2020-03-17 LAB — DIFFERENTIAL
Abs Immature Granulocytes: 0.02 10*3/uL (ref 0.00–0.07)
Basophils Absolute: 0.1 10*3/uL (ref 0.0–0.1)
Basophils Relative: 1 %
Eosinophils Absolute: 0.2 10*3/uL (ref 0.0–0.5)
Eosinophils Relative: 3 %
Immature Granulocytes: 0 %
Lymphocytes Relative: 35 %
Lymphs Abs: 2.6 10*3/uL (ref 0.7–4.0)
Monocytes Absolute: 1.1 10*3/uL — ABNORMAL HIGH (ref 0.1–1.0)
Monocytes Relative: 15 %
Neutro Abs: 3.5 10*3/uL (ref 1.7–7.7)
Neutrophils Relative %: 46 %

## 2020-03-17 LAB — I-STAT CHEM 8, ED
BUN: 29 mg/dL — ABNORMAL HIGH (ref 8–23)
Calcium, Ion: 1.15 mmol/L (ref 1.15–1.40)
Chloride: 104 mmol/L (ref 98–111)
Creatinine, Ser: 1.2 mg/dL — ABNORMAL HIGH (ref 0.44–1.00)
Glucose, Bld: 138 mg/dL — ABNORMAL HIGH (ref 70–99)
HCT: 34 % — ABNORMAL LOW (ref 36.0–46.0)
Hemoglobin: 11.6 g/dL — ABNORMAL LOW (ref 12.0–15.0)
Potassium: 3.5 mmol/L (ref 3.5–5.1)
Sodium: 143 mmol/L (ref 135–145)
TCO2: 27 mmol/L (ref 22–32)

## 2020-03-17 LAB — URINALYSIS, ROUTINE W REFLEX MICROSCOPIC
Bacteria, UA: NONE SEEN
Bilirubin Urine: NEGATIVE
Glucose, UA: NEGATIVE mg/dL
Ketones, ur: NEGATIVE mg/dL
Leukocytes,Ua: NEGATIVE
Nitrite: NEGATIVE
Protein, ur: 100 mg/dL — AB
Specific Gravity, Urine: 1.02 (ref 1.005–1.030)
pH: 7 (ref 5.0–8.0)

## 2020-03-17 LAB — ETHANOL: Alcohol, Ethyl (B): <10 mg/dL (ref <10)

## 2020-03-17 LAB — RAPID URINE DRUG SCREEN, HOSP PERFORMED
Amphetamines: NOT DETECTED
Barbiturates: NOT DETECTED
Benzodiazepines: NOT DETECTED
Cocaine: NOT DETECTED
Opiates: NOT DETECTED
Tetrahydrocannabinol: NOT DETECTED

## 2020-03-17 LAB — PROTIME-INR
INR: 1.1 (ref 0.8–1.2)
Prothrombin Time: 13.8 seconds (ref 11.4–15.2)

## 2020-03-17 LAB — HEMOGLOBIN A1C
Hgb A1c MFr Bld: 6.3 % — ABNORMAL HIGH (ref 4.8–5.6)
Mean Plasma Glucose: 134.11 mg/dL

## 2020-03-17 LAB — GLUCOSE, CAPILLARY
Glucose-Capillary: 149 mg/dL — ABNORMAL HIGH (ref 70–99)
Glucose-Capillary: 149 mg/dL — ABNORMAL HIGH (ref 70–99)

## 2020-03-17 LAB — SARS CORONAVIRUS 2 BY RT PCR (HOSPITAL ORDER, PERFORMED IN ~~LOC~~ HOSPITAL LAB): SARS Coronavirus 2: NEGATIVE

## 2020-03-17 LAB — CBG MONITORING, ED: Glucose-Capillary: 122 mg/dL — ABNORMAL HIGH (ref 70–99)

## 2020-03-17 LAB — APTT: aPTT: 30 seconds (ref 24–36)

## 2020-03-17 MED ORDER — LACTATED RINGERS IV SOLN: INTRAVENOUS | Status: DC

## 2020-03-17 MED ORDER — NICARDIPINE HCL IN NACL 20-0.86 MG/200ML-% IV SOLN
INTRAVENOUS | Status: AC
  Filled 2020-03-17: qty 200

## 2020-03-17 MED ORDER — CLOPIDOGREL BISULFATE 75 MG PO TABS
300.0000 mg | ORAL_TABLET | Freq: Once | ORAL | Status: DC
  Filled 2020-03-17: qty 4

## 2020-03-17 MED ORDER — ASPIRIN 300 MG RE SUPP
300.0000 mg | Freq: Every day | RECTAL | Status: DC
  Administered 2020-03-17: 300 mg via RECTAL

## 2020-03-17 MED ORDER — IOHEXOL 350 MG/ML SOLN
75.0000 mL | Freq: Once | INTRAVENOUS | Status: AC | PRN
  Administered 2020-03-17: 75 mL via INTRAVENOUS

## 2020-03-17 MED ORDER — ASPIRIN EC 325 MG PO TBEC
325.0000 mg | DELAYED_RELEASE_TABLET | Freq: Every day | ORAL | Status: DC
  Administered 2020-03-17 – 2020-03-21 (×5): 325 mg via ORAL
  Filled 2020-03-17 (×5): qty 1

## 2020-03-17 MED ORDER — ATORVASTATIN CALCIUM 40 MG PO TABS
40.0000 mg | ORAL_TABLET | Freq: Every day | ORAL | Status: DC
  Administered 2020-03-18 – 2020-03-21 (×4): 40 mg via ORAL
  Filled 2020-03-17 (×5): qty 1

## 2020-03-17 MED ORDER — INSULIN ASPART 100 UNIT/ML ~~LOC~~ SOLN: 0.0000 [IU] | SUBCUTANEOUS | Status: DC

## 2020-03-17 MED ORDER — ONDANSETRON HCL 4 MG/2ML IJ SOLN
4.0000 mg | Freq: Once | INTRAMUSCULAR | Status: AC
  Administered 2020-03-17: 4 mg via INTRAVENOUS

## 2020-03-17 MED ORDER — LACTATED RINGERS IV BOLUS
1000.0000 mL | Freq: Once | INTRAVENOUS | Status: AC
  Administered 2020-03-17: 1000 mL via INTRAVENOUS

## 2020-03-17 MED ORDER — ENOXAPARIN SODIUM 40 MG/0.4ML ~~LOC~~ SOLN
40.0000 mg | SUBCUTANEOUS | Status: DC
  Administered 2020-03-18 – 2020-03-20 (×3): 40 mg via SUBCUTANEOUS
  Filled 2020-03-17 (×3): qty 0.4

## 2020-03-17 MED ORDER — CLOPIDOGREL BISULFATE 75 MG PO TABS
75.0000 mg | ORAL_TABLET | Freq: Every day | ORAL | Status: DC
  Administered 2020-03-18 – 2020-03-21 (×4): 75 mg via ORAL
  Filled 2020-03-17 (×4): qty 1

## 2020-03-17 NOTE — Plan of Care (Signed)

## 2020-03-17 NOTE — ED Triage Notes (Signed)
Pt BIB [facility] as CODE STROKE. Pt was up walking and had sudden onset of vomiting, headache, and rt. sided deficits. Pt with hx of HTN, VSS.

## 2020-03-17 NOTE — H&P (Addendum)
Date: 03/17/2020               Patient Name:  Natasha Jones MRN: [id number]  DOB: 07/17/1941 Age / Sex: 79 y.o., female   PCP: No primary care provider on file.         Medical Service: Internal Medicine Teaching Service         Attending Physician: Dr. Sandre Kitty    First Contact: Dr. Imogene Burn Pager: [phone]  Second Contact: Dortha Schwalbe, MD, Mannie Wineland Pager: OA [phone])       After Hours (After 5p/  First Contact Pager: [phone]  weekends / holidays): Second Contact Pager: [phone]   Chief Complaint: Dizziness, right side weakness, Vomiting  History of Present Illness: Natasha Jones is a 79 year old woman with medical history significant for chronic right-sided weakness of unclear etiology, unknown cardiac dysrhythmic disease, hypertension, diabetes who presented to the emergency department with sudden onset dizziness, acute on chronic right-sided weakness, headache and vomiting.  The patient and her family are visiting from [facility] and she was in her usual state of health until around 12: 20 PM when she began experiencing sudden onset dizziness that was associated with headache. She began feeling nauseous, had several episodes of nonbloody nonbilious emesis.  Daughter reports that at the onset of symptoms, her mother sat down for a while but the symptoms persisted and when she began vomiting, it prompted them to report to the emergency department. Per the daughter she had noticed that her mom's speech was "slow "but did not notice any slurred speech or dysarthria. On further questioning, the patient reported that she "felt woozy, funny and was sick to her stomach. " She denies urinary or bowel incontinence, chest pain, shortness of breath. The daughter denies recognizing any facial droop on her mom's face.  She also did mention that she has been having increased urinary frequency but denies dysuria.  Natasha Jones has been experiencing right-sided weakness for about a year now and  per the daughter, they have not been able to find a clear etiology. Her right-sided weakness has been improving with physical therapy.   ED course: Afebrile, BP 149/56, pulse 71, RR 19-22, SPO2 92-94% on room air.  BMP shows mild hypokalemia of 3.3, creatinine of 1.1.  CBC shows hemoglobin of 11.1, hematocrit 35.2.  CT head without contrast shows no acute findings, CT angiography head and neck shows no intracranial large or medium vessel occlusion or proximal stenosis, does show mild atherosclerotic change at both carotid bifurcations with 20% stenosis distal ICA bulb on the left.  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  Neurology was consulted in the ED.   Lab Orders     SARS Coronavirus 2 by RT PCR (hospital order, performed in [facility] hospital lab) Nasopharyngeal Nasopharyngeal Swab     Ethanol     Protime-INR     APTT     CBC     Differential     Comprehensive metabolic panel     Urine rapid drug screen (hosp performed)     Urinalysis, Routine w reflex microscopic     CBC     Basic metabolic panel     Hemoglobin Z7Q     Lipid panel     I-stat chem 8, ED     CBG monitoring, ED   Meds:  No outpatient medications have been marked as taking for the 03/17/20 encounter [facility] Encounter).  *I made several attempts to call patient's pharmacy in [facility]  [place].  I was not able to reach her pharmacist.  [facility] [address], [place], [place] [address]. [phone]*   Allergies: Allergies as of 03/17/2020 - Review Complete 03/17/2020  Allergen Reaction Noted  . Ativan [lorazepam]  03/17/2020   Past Medical History:  Diagnosis Date  . Hypertension     Family History: Daughter reports a significant family history of hypertension, diabetes, heart disease and stroke  Social History:  -Lives in [place] with her son and daughter who also lives close by. She is able to complete her activities of daily living such as dressing, cleaning and walking  with a cane. 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 " -Denies current alcohol, cigarette use or any illicit drug use  Review of Systems: A complete ROS was negative except as per HPI.   Physical Exam: Blood pressure (!) 146/40, pulse 73, temperature 97.9 F (36.6 C), temperature source Oral, resp. rate 17, height 5\' 6"  (1.676 m), weight 84.2 kg, SpO2 98 %. Physical Exam Vitals and nursing note reviewed.  Constitutional:      Appearance: She is obese.  HENT:     Head: Normocephalic and atraumatic.     Nose: Nose normal.     Mouth/Throat:     Mouth: Mucous membranes are moist.  Eyes:     Conjunctiva/sclera: Conjunctivae normal.     Pupils: Pupils are equal, round, and reactive to light.     Comments: Cataracts bilaterally  Cardiovascular:     Rate and Rhythm: Rhythm irregular.     Heart sounds: Murmur (Questionable diastolic murmur at the second left intercostal space) heard.   Pulmonary:     Effort: Pulmonary effort is normal.     Breath sounds: Normal breath sounds. No wheezing, rhonchi or rales.  Abdominal:     General: There is no distension.     Tenderness: There is no abdominal tenderness.  Musculoskeletal:     Cervical back: Normal range of motion and neck supple. No rigidity or tenderness.  Neurological:     Mental Status: She is alert.   Neurologic exam: Mental status: A&Ox3 Cranial Nerves:             General: Speech is mildly dysarthric II: PERRL III, IV, VI: Extra-occular motions intact bilaterally V, VII: Face symmetric, sensation intact in all 3 divisions  IX, X: palate rises symmetrically XI: Head turn and shoulder shrug normal bilaterally  XII: tongue midline  Motor:  -Right upper extremity: 2-3/5  -Right lower extremity: 2-3/5 -Left upper extremity: 4-5/5 -Left lower extremity:  4-5/5 Coordination: Abnormal coordination (finger-to-nose), much worse on the right upper extremity Psychiatric: Normal mood and affect  EKG: Sinus rhythm with prolonged PR interval  Assessment & Plan by Problem: Principal Problem:   Dizziness Active Problems:   Hypertension   Diabetes (HCC)  Ms. Sherwood is a 79 year old woman with medical history significant for chronic right-sided weakness of unclear etiology, unknown cardiac arrhythmia, hypertension, diabetes who presented to the emergency department with sudden onset dizziness, acute on chronic right-sided weakness, headache and vomiting concern for acute cerebrovascular accident  #Acute cerebellar infarct: Presents with acute onset dizziness with associated questionable vertigo, right-sided weakness, nausea and nonbloody nonbilious emesis.  She does have a history of hypertension, diabetes mellitus and an unknown cardiac arrhythmic disease.  Stat CT head unremarkable however CT angiography head and neck shows right vertebral artery occlusion which  could be explaining her symptoms.  She is high risk for embolic disease given her underlying medical problems.  MRI brain shows 1 cm acute infarction in the inferior cerebellum on the right. 3-4 cm acute infarction of the superior cerebellum on the right. -- Neurology consult, appreciate recommendation -- Allow for permissive HTN in the setting of acute CVA (systolic < 220 and diastolic < 120)    -- Echocardiogram  -- A1C  -- Lipid panel  -- Tele monitoring  -- SLP eval -- PT/OT  #Diabetes mellitus: --Follow-up hemoglobin A1c --SSI every 4 hours  #Hypertension: Allow for permissive hypertension as stated above  #Unknown cardiac dysrhythmia: Daughter reports that her mom has a known cardiac disease however her physician has been tapering down on some of her medications.  On physical exams, she does have an irregular rhythm on heart auscultation with diastolic murmur at the second left  intercostal space.  EKG shows sinus rhythm with no clear evidence of atrial fibrillation --Telemetry monitoring  #AKI versus CKD: Serum creatinine on admission 1.2<<1.1.  Unknown baseline --Continue to monitor  FEN: N.p.o. until passes speech eval VTE ppx: Cutaneous Lovenox to be started 03/18/2020 CODE STATUS: Full code  Prior to Admission Living Arrangement: Home Anticipated Discharge Location: Pending Barriers to Discharge: Ongoing medical work-up  Dispo: Admit patient to Inpatient with expected length of stay greater than 2 midnights.  Signed: Yvette Rack, MD 03/17/2020, 3:19 PM  Pager: [phone] Internal Medicine Teaching Service After 5pm on weekdays and 1pm on weekends: On Call pager: [phone]

## 2020-03-17 NOTE — Code Documentation (Addendum)
Code Stroke  Pt arrived to [facility] at 1309 via [facility] with complaints of right sided deficients, headache, and vomiting. Code stroke was activated at 1252. LKW 1200. Per daughter, pt has history of HTN and right sided weakness. On arrival, pt is noted to have an NIH of 8. She is covered in emesis and endorses dizziness. IV Zofran given prior to CT. Pt noted to have nystagmus when looking to the right. Right facial asymmetry. Can't resist gravity to the right leg. Ataxic. Moderate aphasia -she is able to name some objects, but incorrectly identifies others. Mild dysarthria, some words are slurred. CT and CTA completed.  Plan: q30 min mNIHSS and q15 VS until out of tPA window. Report given to R. Susette Racer, RN.

## 2020-03-17 NOTE — Consult Note (Addendum)
NEURO HOSPITALIST  CONSULT   Requesting Physician: Dr. Madilyn Hook    Chief Complaint: dizziness/ right side weakness  History obtained from:  Patient  / daughter/ EMS HPI:                                                                                                                                         Natasha Jones is an 79 y.o. female  With PMH HTN, DM who presented to [facility] ED as a code stroke for c/o sudden onsert right side weakness, dizziness, HA, vomiting.  Patient is a resident of [place]. Her and her family were at a hotel. When at noon she had a sudden onset of dizziness HA, vomiting and nausea with reported right side weakness. Patient describes dizziness as not feeling right but denies room spinning.Per daughter her right side weakness has been there for a year, it is not worse. Patient usually ambulates with a rolling walker. Denies stroke history. Patient usually receives care at [facility] in [facility] and their records are not available in care every where. Denies any blood thinners, recent surgeries or rectal bleeding.    ED course:  CTH: no hemorrhage CTA: no LVO    Date last known well: 03/17/20 Time last known well: 1200  tPA Given: no, too good to treat Modified Rankin: Rankin Score=3 NIHSS:7    No past medical history on file.  History reviewed. No pertinent surgical history.  History reviewed. No pertinent family history.      Social History:  has no history on file for tobacco use, alcohol use, and drug use.  Allergies:  Allergies  Allergen Reactions  . Ativan [Lorazepam]     Medications:                                                                                                                           Current Facility-Administered Medications  Medication Dose Route Frequency Provider Last Rate Last Admin  . niCARdipine in saline (CARDENE-IV) 20-0.86 MG/200ML-% infusion SOLN             No current  outpatient medications on file.     ROS:                                                                                                                                       ROS was performed and is negative except as noted in HPI    General Examination:                                                                                                      There were no vitals taken for this visit.  Physical Exam  Constitutional: Appears well-developed and well-nourished.  Psych: Affect appropriate to situation Eyes: Normal external eye and conjunctiva. HENT: Normocephalic, no lesions, without obvious abnormality.   Musculoskeletal-no joint tenderness, deformity or swelling Cardiovascular: Normal rate and regular rhythm.  Respiratory: Effort normal, non-labored breathing saturations WNL GI: Soft.  No distension. There is no tenderness.  Skin: WDI  Neurological Examination Mental Status: Alert, oriented, thought content appropriate.  Speech fluent without evidence of aphasia.  Mild dysarthria. Able to followcommands without difficulty. Cranial Nerves: II:  Visual fields grossly normal,  III,IV, VI: ptosis not present, extra-ocular motions intact with nystagmus on right end gaze bilaterally, pupils equal, round, reactive to light and accommodation V,VII: smile asymmetric, right facial droop,  facial light touch sensation normal bilaterally VIII: hearing normal bilaterally IX,X: uvula rises midline XI: bilateral shoulder shrug XII: midline tongue extension Motor: Right : Upper extremity   4/5 Left:   Upper extremity   5/5  Lower extremity   3+/5 Lower extremity   5/5 Tone and bulk:normal tone throughout; no atrophy noted Sensory:  light touch intact throughout, bilaterally Cerebellar: Ataxia right arm Gait: deferred   Lab Results: Basic Metabolic Panel: Recent Labs  Lab 03/17/20 1313 03/17/20 1320  NA 140 143  K 3.3* 3.5  CL 105 104  CO2 22  --   GLUCOSE  140* 138*  BUN 23 29*  CREATININE 1.16* 1.20*  CALCIUM 9.1  --     CBC: Recent Labs  Lab 03/17/20 1313 03/17/20 1320  WBC 7.5  --   NEUTROABS 3.5  --   HGB 11.1* 11.6*  HCT 35.2* 34.0*  MCV 83.2  --   PLT 214  --     CBG: Recent Labs  Lab 03/17/20 1315  GLUCAP 122*    Imaging: CT Code Stroke CTA Head W/WO contrast  Result Date: 03/17/2020 CLINICAL DATA:  Code stroke. Headache and right-sided deficits. Negative head CT.  EXAM: CT ANGIOGRAPHY HEAD AND NECK TECHNIQUE: Multidetector CT imaging of the head and neck was performed using the standard protocol during bolus administration of intravenous contrast. Multiplanar CT image reconstructions and MIPs were obtained to evaluate the vascular anatomy. Carotid stenosis measurements (when applicable) are obtained utilizing NASCET criteria, using the distal internal carotid diameter as the denominator. CONTRAST:  2mL OMNIPAQUE IOHEXOL 350 MG/ML SOLN COMPARISON:  Head CT same day FINDINGS: CTA NECK FINDINGS Aortic arch: Aortic atherosclerosis and ectasia. Maximal diameter in the arch on the order of 4.2 cm. Right carotid system: Common carotid artery widely patent to the bifurcation. Atherosclerotic plaque at the carotid bifurcation but no ICA stenosis compared to the more distal cervical ICA. Left carotid system: Common carotid artery widely patent to the bifurcation. Calcified plaque at the distal bulb with minimal diameter of 3.3 mm. Compared to a more distal cervical ICA diameter of 4.1 mm, this indicates a 20% stenosis. Vertebral arteries: Left vertebral artery origin is widely patent. Left vertebral artery is widely patent through the cervical region to the foramen magnum. 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 Skeleton: Ordinary cervical spondylosis. Other neck: No mass or lymphadenopathy. Enlarged heterogeneous thyroid gland consistent with goiter. Upper  chest: Negative Review of the MIP images confirms the above findings CTA HEAD FINDINGS Anterior circulation: Both internal carotid arteries are patent through the skull base and siphon regions. Minimal siphon atherosclerotic calcification without stenosis. The anterior and middle cerebral vessels are normal. No large or medium vessel occlusion. No proximal stenosis. Posterior circulation: Both vertebral arteries show flow through the foramen magnum to the basilar. No basilar stenosis. Posterior circulation branch vessels are normal. Venous sinuses: Patent and normal. Anatomic variants: None significant. Review of the MIP images confirms the above findings IMPRESSION: No intracranial large or medium vessel occlusion or proximal stenosis. Mild atherosclerotic change at both carotid bifurcations. No stenosis on the right. 20% stenosis distal ICA bulb on the left. 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 Pronounced aortic atherosclerosis and ectasia. Maximal diameter at the arch measures 4.2 cm. Recommend semi-annual imaging followup by CTA or MRA and referral to cardiothoracic surgery if not already obtained. This recommendation follows 2010 ACCF/AHA/AATS/ACR/ASA/SCA/SCAI/SIR/STS/SVM Guidelines for the Diagnosis and Management of Patients With Thoracic Aortic Disease. Circulation. 2010; 121: W737-T06. Aortic aneurysm NOS (ICD10-I71.9) These results were communicated to Dr. Amada Jupiter at 1:45 pmon 7/11/2021by text page via the [facility] messaging system. Electronically Signed   By: Paulina Fusi M.D.   On: 03/17/2020 13:45   CT Code Stroke CTA Neck W/WO contrast  Result Date: 03/17/2020 CLINICAL DATA:  Code stroke. Headache and right-sided deficits. Negative head CT. EXAM: CT ANGIOGRAPHY HEAD AND NECK TECHNIQUE: Multidetector CT imaging of the head and neck was performed using the standard protocol during bolus administration of intravenous contrast. Multiplanar  CT image reconstructions and MIPs were obtained to evaluate the vascular anatomy. Carotid stenosis measurements (when applicable) are obtained utilizing NASCET criteria, using the distal internal carotid diameter as the denominator. CONTRAST:  35mL OMNIPAQUE IOHEXOL 350 MG/ML SOLN COMPARISON:  Head CT same day FINDINGS: CTA NECK FINDINGS Aortic arch: Aortic atherosclerosis and ectasia. Maximal diameter in the arch on the order of 4.2 cm. Right carotid system: Common carotid artery widely patent to the bifurcation. Atherosclerotic plaque at the carotid bifurcation but no ICA stenosis compared to the more distal cervical ICA. Left carotid system: Common carotid artery  widely patent to the bifurcation. Calcified plaque at the distal bulb with minimal diameter of 3.3 mm. Compared to a more distal cervical ICA diameter of 4.1 mm, this indicates a 20% stenosis. Vertebral arteries: Left vertebral artery origin is widely patent. Left vertebral artery is widely patent through the cervical region to the foramen magnum. 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 Skeleton: Ordinary cervical spondylosis. Other neck: No mass or lymphadenopathy. Enlarged heterogeneous thyroid gland consistent with goiter. Upper chest: Negative Review of the MIP images confirms the above findings CTA HEAD FINDINGS Anterior circulation: Both internal carotid arteries are patent through the skull base and siphon regions. Minimal siphon atherosclerotic calcification without stenosis. The anterior and middle cerebral vessels are normal. No large or medium vessel occlusion. No proximal stenosis. Posterior circulation: Both vertebral arteries show flow through the foramen magnum to the basilar. No basilar stenosis. Posterior circulation branch vessels are normal. Venous sinuses: Patent and normal. Anatomic variants: None significant. Review of the MIP images confirms the  above findings IMPRESSION: No intracranial large or medium vessel occlusion or proximal stenosis. Mild atherosclerotic change at both carotid bifurcations. No stenosis on the right. 20% stenosis distal ICA bulb on the left. 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 Pronounced aortic atherosclerosis and ectasia. Maximal diameter at the arch measures 4.2 cm. Recommend semi-annual imaging followup by CTA or MRA and referral to cardiothoracic surgery if not already obtained. This recommendation follows 2010 ACCF/AHA/AATS/ACR/ASA/SCA/SCAI/SIR/STS/SVM Guidelines for the Diagnosis and Management of Patients With Thoracic Aortic Disease. Circulation. 2010; 121: Z610-R60: E266-e36. Aortic aneurysm NOS (ICD10-I71.9) These results were communicated to Dr. Amada JupiterKirkpatrick at 1:45 pmon 7/11/2021by text page via the [facility] messaging system. Electronically Signed   By: Paulina FusiMark  Shogry M.D.   On: 03/17/2020 13:45   CT HEAD CODE STROKE WO CONTRAST  Result Date: 03/17/2020 CLINICAL DATA:  Code stroke.  Headache with right-sided deficits. EXAM: CT HEAD WITHOUT CONTRAST TECHNIQUE: Contiguous axial images were obtained from the base of the skull through the vertex without intravenous contrast. COMPARISON:  None. FINDINGS: Brain: No evidence of acute infarction, hemorrhage, hydrocephalus, extra-axial collection or mass lesion/mass effect. Mild for age cerebral volume loss and white matter low-density. Vascular: No hyperdense vessel or unexpected calcification. Skull: Normal. Negative for fracture or focal lesion. Sinuses/Orbits: No acute finding. Other: These results were communicated to Dr. Amada JupiterKirkpatrick at 1:22 pmon 7/11/2021by text page via the [facility] messaging system. ASPECTS [facility]([place] Stroke Program Early CT Score) - Ganglionic level infarction (caudate, lentiform nuclei, internal capsule, insula, M1-M3 cortex): 7 - Supraganglionic infarction (M4-M6 cortex): 3 Total score (0-10 with 10 being  normal): 10 IMPRESSION: No acute finding.  Unremarkable study for age. Electronically Signed   By: Marnee SpringJonathon  Watts M.D.   On: 03/17/2020 13:22       Valentina LucksJessica Rossini, MSN, NP-C [facility] [phone]  03/17/2020, 1:20 PM   Attending physician note to follow with Assessment and plan .  I have seen the patient reviewed the above note.  She presents with acute dizziness and right-sided ataxia.  Initially I offered IV TPA to the patient and her daughter, however the daughter states that her right-sided deficits are baseline.  I discussed treatment of acute vertigo with IV TPA, and stated that if her right-sided deficits really were at baseline, I would not typically give TPA solely for vertigo, but that it might still be due to acute stroke.  Daughter expressed  understanding with not proceeding with TPA.  About that time, the patient was also having improvement further arguing against IV TPA.  Assessment: 79 y.o. female With PMH HTN, DM who presented to [facility] ED as a code stroke for c/o sudden onset vertigo and nausea.  Eyes continue to suspect that this might be due to small ischemic infarct, but further imaging evaluation could be helpful.  Stroke Risk Factors - diabetes mellitus and hypertension   Recommendations: - HgbA1c, fasting lipid panel - MRI of the brain without contrast - Frequent neuro checks - Echocardiogram - Prophylactic therapy-Antiplatelet med: Aspirin - dose 325mg  PO or 300mg  PR - Risk factor modification - Telemetry monitoring - PT consult, OT consult, Speech consult - Stroke team to follow   --Please page the Stroke team from 8am-4pm.   You can look them up on [email or web address]    , MD [facility] [phone]  If 7pm- 7am, please page neurology on call as listed in AMION.

## 2020-03-17 NOTE — ED Provider Notes (Signed)
[facility] EMERGENCY DEPARTMENT Provider Note   CSN: [id number] Arrival date & time: 03/17/20  1311  An emergency department physician performed an initial assessment on this suspected stroke patient at 1310.  History Chief Complaint  Patient presents with  . Code Stroke    Natasha Jones is a 79 y.o. female.  The history is provided by the patient, the EMS personnel and a relative. No language interpreter was used.   Natasha Jones is a 79 y.o. female who presents to the Emergency Department complaining of dizziness. Level V caveat due to distress and confusion. History is provided by EMS. She presents the emergency department as a code stroke for evaluation of new onset dizziness, vomiting and right-sided weakness. She was last known to be well at 1220 today. She is traveling with family and is currently staying in a hotel. She is from [facility]. EMS reports numerous episodes of emesis prior to ED arrival. Symptoms are severe in nature. She complained of headaches family members prior to EMS arrival.    Past Medical History:  Diagnosis Date  . Hypertension     Patient Active Problem List   Diagnosis Date Noted  . Dizziness 03/17/2020  . Hypertension 03/17/2020  . Diabetes (HCC) 03/17/2020    History reviewed. No pertinent surgical history.   OB History   No obstetric history on file.     History reviewed. No pertinent family history.  Social History   Tobacco Use  . Smoking status: Not on file  Substance Use Topics  . Alcohol use: Not on file  . Drug use: Not on file    Home Medications Prior to Admission medications   Not on File    Allergies    Ativan [lorazepam]  Review of Systems   Review of Systems  Unable to perform ROS: Mental status change    Physical Exam Updated Vital Signs BP (!) 138/30   Pulse 68   Temp 97.9 F (36.6 C) (Oral)   Resp 17   Ht 5\' 6"  (1.676 m)   Wt 84.2 kg   SpO2 94%   BMI  29.96 kg/m   Physical Exam Vitals and nursing note reviewed.  Constitutional:      Appearance: She is well-developed.  HENT:     Head: Normocephalic and atraumatic.  Cardiovascular:     Rate and Rhythm: Normal rate and regular rhythm.     Heart sounds: No murmur heard.   Pulmonary:     Effort: Pulmonary effort is normal. No respiratory distress.     Breath sounds: Normal breath sounds.  Abdominal:     Palpations: Abdomen is soft.     Tenderness: There is no abdominal tenderness. There is no guarding or rebound.  Musculoskeletal:        General: No tenderness.     Comments: Trace edema to bilateral lower extremities  Skin:    General: Skin is warm and dry.  Neurological:     Mental Status: She is alert.     Comments: Awake and alert. Confused. 3 to 5 strength in the right upper extremity. 2 out of 5 strength in the right lower extremity. Five out of five strength in left upper and left lower extremity. Very quiet speech. Slow to answer questions. Horizontal nystagmus. No asymmetry of facial movements.   Psychiatric:        Behavior: Behavior normal.     ED Results / Procedures / Treatments   Labs (all labs ordered are  listed, but only abnormal results are displayed) Labs Reviewed  CBC - Abnormal; Notable for the following components:      Result Value   Hemoglobin 11.1 (*)    HCT 35.2 (*)    All other components within normal limits  DIFFERENTIAL - Abnormal; Notable for the following components:   Monocytes Absolute 1.1 (*)    All other components within normal limits  COMPREHENSIVE METABOLIC PANEL - Abnormal; Notable for the following components:   Potassium 3.3 (*)    Glucose, Bld 140 (*)    Creatinine, Ser 1.16 (*)    Total Protein 8.3 (*)    Albumin 3.2 (*)    GFR calc non Af Amer 44 (*)    GFR calc Af Amer 51 (*)    All other components within normal limits  I-STAT CHEM 8, ED - Abnormal; Notable for the following components:   BUN 29 (*)    Creatinine, Ser  1.20 (*)    Glucose, Bld 138 (*)    Hemoglobin 11.6 (*)    HCT 34.0 (*)    All other components within normal limits  CBG MONITORING, ED - Abnormal; Notable for the following components:   Glucose-Capillary 122 (*)    All other components within normal limits  SARS CORONAVIRUS 2 BY RT PCR (HOSPITAL ORDER, PERFORMED IN [place] [facility])  ETHANOL  PROTIME-INR  APTT  RAPID URINE DRUG SCREEN, HOSP PERFORMED  URINALYSIS, ROUTINE W REFLEX MICROSCOPIC  HEMOGLOBIN A1C  LIPID PANEL    EKG EKG Interpretation  Date/Time:  Sunday March 17 2020 13:47:10 EDT Ventricular Rate:  73 PR Interval:    QRS Duration: 109 QT Interval:  475 QTC Calculation: 524 R Axis:   -16 Text Interpretation: Unknown rhythm, irregular rate Borderline prolonged PR interval Left ventricular hypertrophy Prolonged QT interval Confirmed by Tilden Fossa [phone]) on 03/17/2020 2:00:33 PM   Radiology CT Code Stroke CTA Head W/WO contrast  Result Date: 03/17/2020 CLINICAL DATA:  Code stroke. Headache and right-sided deficits. Negative head CT. EXAM: CT ANGIOGRAPHY HEAD AND NECK TECHNIQUE: Multidetector CT imaging of the head and neck was performed using the standard protocol during bolus administration of intravenous contrast. Multiplanar CT image reconstructions and MIPs were obtained to evaluate the vascular anatomy. Carotid stenosis measurements (when applicable) are obtained utilizing NASCET criteria, using the distal internal carotid diameter as the denominator. CONTRAST:  16mL OMNIPAQUE IOHEXOL 350 MG/ML SOLN COMPARISON:  Head CT same day FINDINGS: CTA NECK FINDINGS Aortic arch: Aortic atherosclerosis and ectasia. Maximal diameter in the arch on the order of 4.2 cm. Right carotid system: Common carotid artery widely patent to the bifurcation. Atherosclerotic plaque at the carotid bifurcation but no ICA stenosis compared to the more distal cervical ICA. Left carotid system: Common carotid artery widely patent to the  bifurcation. Calcified plaque at the distal bulb with minimal diameter of 3.3 mm. Compared to a more distal cervical ICA diameter of 4.1 mm, this indicates a 20% stenosis. Vertebral arteries: Left vertebral artery origin is widely patent. Left vertebral artery is widely patent through the cervical region to the foramen magnum. 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 Skeleton: Ordinary cervical spondylosis. Other neck: No mass or lymphadenopathy. Enlarged heterogeneous thyroid gland consistent with goiter. Upper chest: Negative Review of the MIP images confirms the above findings CTA HEAD FINDINGS Anterior circulation: Both internal carotid arteries are patent through the skull base and siphon regions. Minimal  siphon atherosclerotic calcification without stenosis. The anterior and middle cerebral vessels are normal. No large or medium vessel occlusion. No proximal stenosis. Posterior circulation: Both vertebral arteries show flow through the foramen magnum to the basilar. No basilar stenosis. Posterior circulation branch vessels are normal. Venous sinuses: Patent and normal. Anatomic variants: None significant. Review of the MIP images confirms the above findings IMPRESSION: No intracranial large or medium vessel occlusion or proximal stenosis. Mild atherosclerotic change at both carotid bifurcations. No stenosis on the right. 20% stenosis distal ICA bulb on the left. 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 Pronounced aortic atherosclerosis and ectasia. Maximal diameter at the arch measures 4.2 cm. Recommend semi-annual imaging followup by CTA or MRA and referral to cardiothoracic surgery if not already obtained. This recommendation follows 2010 ACCF/AHA/AATS/ACR/ASA/SCA/SCAI/SIR/STS/SVM Guidelines for the Diagnosis and Management of Patients With Thoracic Aortic Disease.  Circulation. 2010; 121: Z610-R60: E266-e36. Aortic aneurysm NOS (ICD10-I71.9) These results were communicated to Dr. Amada JupiterKirkpatrick at 1:45 pmon 7/11/2021by text page via the [facility] system. Electronically Signed   By: Paulina FusiMark  Shogry M.D.   On: 03/17/2020 13:45   CT Code Stroke CTA Neck W/WO contrast  Result Date: 03/17/2020 CLINICAL DATA:  Code stroke. Headache and right-sided deficits. Negative head CT. EXAM: CT ANGIOGRAPHY HEAD AND NECK TECHNIQUE: Multidetector CT imaging of the head and neck was performed using the standard protocol during bolus administration of intravenous contrast. Multiplanar CT image reconstructions and MIPs were obtained to evaluate the vascular anatomy. Carotid stenosis measurements (when applicable) are obtained utilizing NASCET criteria, using the distal internal carotid diameter as the denominator. CONTRAST:  75mL OMNIPAQUE IOHEXOL 350 MG/ML SOLN COMPARISON:  Head CT same day FINDINGS: CTA NECK FINDINGS Aortic arch: Aortic atherosclerosis and ectasia. Maximal diameter in the arch on the order of 4.2 cm. Right carotid system: Common carotid artery widely patent to the bifurcation. Atherosclerotic plaque at the carotid bifurcation but no ICA stenosis compared to the more distal cervical ICA. Left carotid system: Common carotid artery widely patent to the bifurcation. Calcified plaque at the distal bulb with minimal diameter of 3.3 mm. Compared to a more distal cervical ICA diameter of 4.1 mm, this indicates a 20% stenosis. Vertebral arteries: Left vertebral artery origin is widely patent. Left vertebral artery is widely patent through the cervical region to the foramen magnum. 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 Skeleton: Ordinary cervical spondylosis. Other neck: No mass or lymphadenopathy. Enlarged heterogeneous thyroid gland consistent with goiter. Upper chest: Negative Review of the MIP images  confirms the above findings CTA HEAD FINDINGS Anterior circulation: Both internal carotid arteries are patent through the skull base and siphon regions. Minimal siphon atherosclerotic calcification without stenosis. The anterior and middle cerebral vessels are normal. No large or medium vessel occlusion. No proximal stenosis. Posterior circulation: Both vertebral arteries show flow through the foramen magnum to the basilar. No basilar stenosis. Posterior circulation branch vessels are normal. Venous sinuses: Patent and normal. Anatomic variants: None significant. Review of the MIP images confirms the above findings IMPRESSION: No intracranial large or medium vessel occlusion or proximal stenosis. Mild atherosclerotic change at both carotid bifurcations. No stenosis on the right. 20% stenosis distal ICA bulb on the left. 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 Pronounced aortic atherosclerosis and ectasia. Maximal diameter at the arch measures 4.2 cm. Recommend semi-annual imaging followup by CTA or MRA  and referral to cardiothoracic surgery if not already obtained. This recommendation follows 2010 ACCF/AHA/AATS/ACR/ASA/SCA/SCAI/SIR/STS/SVM Guidelines for the Diagnosis and Management of Patients With Thoracic Aortic Disease. Circulation. 2010; 121: U981-X91. Aortic aneurysm NOS (ICD10-I71.9) These results were communicated to Dr. Amada Jupiter at 1:45 pmon 7/11/2021by text page via the [facility] messaging system. Electronically Signed   By: Paulina Fusi M.D.   On: 03/17/2020 13:45   CT HEAD CODE STROKE WO CONTRAST  Result Date: 03/17/2020 CLINICAL DATA:  Code stroke.  Headache with right-sided deficits. EXAM: CT HEAD WITHOUT CONTRAST TECHNIQUE: Contiguous axial images were obtained from the base of the skull through the vertex without intravenous contrast. COMPARISON:  None. FINDINGS: Brain: No evidence of acute infarction, hemorrhage, hydrocephalus, extra-axial  collection or mass lesion/mass effect. Mild for age cerebral volume loss and white matter low-density. Vascular: No hyperdense vessel or unexpected calcification. Skull: Normal. Negative for fracture or focal lesion. Sinuses/Orbits: No acute finding. Other: These results were communicated to Dr. Amada Jupiter at 1:22 pmon 7/11/2021by text page via the [facility] messaging system. ASPECTS [facility] Stroke Program Early CT Score) - Ganglionic level infarction (caudate, lentiform nuclei, internal capsule, insula, M1-M3 cortex): 7 - Supraganglionic infarction (M4-M6 cortex): 3 Total score (0-10 with 10 being normal): 10 IMPRESSION: No acute finding.  Unremarkable study for age. Electronically Signed   By: Marnee Spring M.D.   On: 03/17/2020 13:22    Procedures Procedures (including critical care time)  Medications Ordered in ED Medications  niCARdipine in saline (CARDENE-IV) 20-0.86 MG/200ML-% infusion SOLN (has no administration in time range)  enoxaparin (LOVENOX) injection 40 mg (has no administration in time range)  insulin aspart (novoLOG) injection 0-15 Units (has no administration in time range)  lactated ringers bolus 1,000 mL (has no administration in time range)    Followed by  lactated ringers infusion (has no administration in time range)  iohexol (OMNIPAQUE) 350 MG/ML injection 75 mL (75 mLs Intravenous Contrast Given 03/17/20 1333)  ondansetron (ZOFRAN) injection 4 mg (4 mg Intravenous Given 03/17/20 1320)    ED Course  I have reviewed the triage vital signs and the nursing notes.  Pertinent labs & imaging results that were available during my care of the patient were reviewed by me and considered in my medical decision making (see chart for details).    MDM Rules/Calculators/A&P                         Patient presented to the emergency department by EMS as a code stroke for new onset dizziness, vomiting, right upper extremity weakness. She was evaluated by neurology on ED arrival and  transferred emergently to the CT scanner. Imaging is negative for acute stroke. She was treated with Zofran for her nausea. When additional history available from her family members the right upper extremity deficits are at her baseline. She was deemed to be not a TPA candidate by neurology. She does have persistent symptoms on reevaluation in the department. Medicine consulted for admission for further workup and treatment.  Final Clinical Impression(s) / ED Diagnoses Final diagnoses:  None    Rx / DC Orders ED Discharge Orders    None       Tilden Fossa, MD 03/17/20 1612

## 2020-03-17 NOTE — Progress Notes (Signed)
Pt passed swallow test, gave nighttime meds, pt vomited approx 240 ml of water, hr rate decreased to 30 for approx 5-8 seconds , after vomiting pt hr recovered to 70. notified md

## 2020-03-18 ENCOUNTER — Inpatient Hospital Stay (HOSPITAL_COMMUNITY): Payer: Medicare Other

## 2020-03-18 DIAGNOSIS — E7800 Pure hypercholesterolemia, unspecified: Secondary | ICD-10-CM

## 2020-03-18 DIAGNOSIS — I10 Essential (primary) hypertension: Secondary | ICD-10-CM

## 2020-03-18 DIAGNOSIS — I351 Nonrheumatic aortic (valve) insufficiency: Secondary | ICD-10-CM | POA: Diagnosis present

## 2020-03-18 DIAGNOSIS — I6389 Other cerebral infarction: Secondary | ICD-10-CM

## 2020-03-18 DIAGNOSIS — E1159 Type 2 diabetes mellitus with other circulatory complications: Secondary | ICD-10-CM

## 2020-03-18 DIAGNOSIS — I25119 Atherosclerotic heart disease of native coronary artery with unspecified angina pectoris: Secondary | ICD-10-CM

## 2020-03-18 DIAGNOSIS — I639 Cerebral infarction, unspecified: Secondary | ICD-10-CM

## 2020-03-18 DIAGNOSIS — M353 Polymyalgia rheumatica: Secondary | ICD-10-CM

## 2020-03-18 LAB — BASIC METABOLIC PANEL
Anion gap: 17 — ABNORMAL HIGH (ref 5–15)
BUN: 19 mg/dL (ref 8–23)
CO2: 22 mmol/L (ref 22–32)
Calcium: 9.1 mg/dL (ref 8.9–10.3)
Chloride: 102 mmol/L (ref 98–111)
Creatinine, Ser: 1.04 mg/dL — ABNORMAL HIGH (ref 0.44–1.00)
GFR calc Af Amer: 58 mL/min — ABNORMAL LOW (ref >60)
GFR calc non Af Amer: 50 mL/min — ABNORMAL LOW (ref >60)
Glucose, Bld: 112 mg/dL — ABNORMAL HIGH (ref 70–99)
Potassium: 3.8 mmol/L (ref 3.5–5.1)
Sodium: 141 mmol/L (ref 135–145)

## 2020-03-18 LAB — CBC
HCT: 32 % — ABNORMAL LOW (ref 36.0–46.0)
Hemoglobin: 10.3 g/dL — ABNORMAL LOW (ref 12.0–15.0)
MCH: 27 pg (ref 26.0–34.0)
MCHC: 32.2 g/dL (ref 30.0–36.0)
MCV: 83.8 fL (ref 80.0–100.0)
Platelets: 205 10*3/uL (ref 150–400)
RBC: 3.82 MIL/uL — ABNORMAL LOW (ref 3.87–5.11)
RDW: 14.3 % (ref 11.5–15.5)
WBC: 9 10*3/uL (ref 4.0–10.5)
nRBC: 0 % (ref 0.0–0.2)

## 2020-03-18 LAB — GLUCOSE, CAPILLARY
Glucose-Capillary: 113 mg/dL — ABNORMAL HIGH (ref 70–99)
Glucose-Capillary: 85 mg/dL (ref 70–99)
Glucose-Capillary: 106 mg/dL — ABNORMAL HIGH (ref 70–99)
Glucose-Capillary: 109 mg/dL — ABNORMAL HIGH (ref 70–99)
Glucose-Capillary: 91 mg/dL (ref 70–99)

## 2020-03-18 LAB — ECHOCARDIOGRAM COMPLETE
Height: 66 in
Weight: 2821.89 oz

## 2020-03-18 MED ORDER — PROMETHAZINE HCL 25 MG/ML IJ SOLN
12.5000 mg | Freq: Three times a day (TID) | INTRAMUSCULAR | Status: DC | PRN
  Administered 2020-03-18: 12.5 mg via INTRAVENOUS
  Filled 2020-03-18: qty 1

## 2020-03-18 MED ORDER — INSULIN ASPART 100 UNIT/ML ~~LOC~~ SOLN: 0.0000 [IU] | Freq: Three times a day (TID) | SUBCUTANEOUS | Status: DC

## 2020-03-18 NOTE — TOC Initial Note (Signed)
Transition of Care [facility]) - Initial/Assessment Note    Patient Details  Name: Natasha Jones MRN: [id number] Date of Birth: 03-22-1941  Transition of Care [facility] CM/SW Contact:    Durenda Guthrie, RN Phone Number: [phone] (workinf remotely) 03/18/2020, 4:03 PM  Clinical Narrative:  79 yr old female came to ED with N/V, weakness. Patient has had cerebral infarct. She is in [place], [place] visiting with family. Patient lives independently [place], with son and family near by,  has PCP. Patient on aspirin and plavix. TOC team will continue to monitor for needs.   Patient Goals and CMS Choice        Expected Discharge Plan and Services                                                Prior Living Arrangements/Services                       Activities of Daily Living Home Assistive Devices/Equipment: None ADL Screening (condition at time of admission) Patient's cognitive ability adequate to safely complete daily activities?: Yes Is the patient deaf or have difficulty hearing?: No Does the patient have difficulty seeing, even when wearing glasses/contacts?: No Does the patient have difficulty concentrating, remembering, or making decisions?: No Patient able to express need for assistance with ADLs?: Yes Does the patient have difficulty dressing or bathing?: No Independently performs ADLs?: Yes (appropriate for developmental age) Does the patient have difficulty walking or climbing stairs?: No Weakness of Legs: Both Weakness of Arms/Hands: None  Permission Sought/Granted                  Emotional Assessment              Admission diagnosis:  Dizziness [R42] Patient Active Problem List   Diagnosis Date Noted  . Aortic insufficiency 03/18/2020  . Polymyalgia rheumatica (HCC) 03/18/2020  . Dizziness 03/17/2020  . Hypertension 03/17/2020  . Diabetes (HCC) 03/17/2020  . 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   PCP:  Patient, No Pcp  Per Pharmacy:   [id number] - [place], [place] - [address] AT [facility] [address] [place] [address] Phone: [phone] Fax: [phone]  [id number] - [place],  - [address] AT [facility] [address] & [address] [place] [address] Phone: [phone] Fax: [phone]     Social Determinants of Health (SDOH) Interventions    Readmission Risk Interventions No flowsheet data found.

## 2020-03-18 NOTE — Progress Notes (Signed)
VASCULAR LAB    Bilateral lower extremity venous duplex completed.    Preliminary report:  See CV proc for preliminary results.  Lorenda Grecco, RVT 03/18/2020, 12:26 PM

## 2020-03-18 NOTE — Progress Notes (Signed)
  Echocardiogram 2D Echocardiogram has been performed.  Celene Skeen 03/18/2020, 8:56 AM

## 2020-03-18 NOTE — Progress Notes (Signed)
OT Cancellation Note  Patient Details Name: Natasha Jones MRN: [id number] DOB: 04-18-1941   Cancelled Treatment:    Reason Eval/Treat Not Completed: Patient at procedure or test/ unavailable (Per RN, pt going for vascular testing soon.)  Pt has been actively nauseous and vomitting. Per RN, pt would benefit from a day of rest before OT eval.  Flora Lipps, OTR/L Acute Rehabilitation Services Pager: [phone] Office: [phone]   Izzak Fries C 03/18/2020, 11:55 AM

## 2020-03-18 NOTE — Progress Notes (Signed)
SLP Cancellation Note  Patient Details Name: Starlina Lapre MRN: [id number] DOB: 1941/04/19   Cancelled treatment:       Reason Eval/Treat Not Completed: Other (comment) Pt passed [facility] swallow screen and diet appropriately initiated. No SLP swallow eval needed at this time, will d/c order.    Frenchie Dangerfield, Riley Nearing 03/18/2020, 11:50 AM

## 2020-03-18 NOTE — Progress Notes (Signed)
Pt c/o of not feeling well in her belly. Pt proceeded to vomit previously digested food. Also got a call  From central telemetry pt had bradycardic episode in the 30's. BP 173/52; HR 72. MD called and updated. Phenergan 12.5 mg  given per MD .

## 2020-03-18 NOTE — Progress Notes (Signed)
Subjective:   Hospital day: 1  Overnight event: Patient had an episode of emesis around 10 pm in which she vomited her PO med. The medications were given again in 1 hour.   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 Patient was examined at bedside. States that she feels well this morning. Reports her nausea is improved. Reports abd discomfort which she suspects is secondary to hunger, breakfast arrived to room during examination. She states she has had chronic R-sided weakness for 2 years, but is unclear whether prior workup revealed a stroke. Patient states that her left side has become weaker acutely after the the CVA. She also mentions that her left visual field was getting worse.   Patient states that she was diagnosed with DM and was on Metformin and Gliperzide. She took herself off of the Metformin due to inability to tolerate side effect.   Family was at bedside. We answered all of their questions.  Objective:  Vital signs in last 24 hours: Vitals:   03/17/20 1920 03/17/20 2007 03/17/20 2131 03/18/20 0408  BP: (!) 140/35 (!) 199/50 (!) 155/49 (!) 162/39  Pulse: 62  70 69  Resp: (!) 21  16 16   Temp:  98 F (36.7 C) 97.9 F (36.6 C) 97.7 F (36.5 C)  TempSrc:  Oral Oral Oral  SpO2: 97%  96% 95%  Weight:    80 kg  Height:        Physical Exam  Physical Exam Constitutional:      General: She is not in acute distress. HENT:     Head: Normocephalic.     Mouth/Throat:     Mouth: Mucous membranes are moist.     Pharynx: No oropharyngeal exudate.  Eyes:     General: No scleral icterus.    Conjunctiva/sclera: Conjunctivae normal.  Cardiovascular:     Rate and Rhythm: Rhythm irregular.     Heart sounds: Murmur (3/6 diastolic murmur heard  best at left upper sternla border and radiate to apex) heard.   Pulmonary:     Effort: Pulmonary effort is normal. No respiratory distress.     Breath sounds: Normal breath sounds.  Abdominal:     General: Bowel sounds are normal.     Palpations: Abdomen is soft.  Musculoskeletal:     Right lower leg: No edema.     Left lower leg: No edema.  Skin:    General: Skin is warm.     Coloration: Skin is not jaundiced.  Neurological:     Mental Status: She is alert.     Comments: Mild facial muscle weakness on the left side. Left peripheral visual field deficit. Other CN tests are normal.  Positive Dysmetria on the Right. Negative Dysmetria on the left.  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   Principal Problem:   Cerebellar infarct [facility]) Active Problems:   Dizziness  Hypertension   Diabetes (HCC)  Acute cerebellar infarct Presents with acute onset dizziness, left and right-sided weakness, nausea and nonbloody nonbilious emesis. She has a history of hypertension, diabetes mellitus and an unknown cardiac arrhythmic disease. Brain Mri shows 1 cm acute infarction in the inferior cerebellum and 3-4 cm acute infarction of the superior cerebellum on the right. No hemorrhage noted. CTA shows right vertebral artery occlusion and no visible flow of superior cerebellar artery. PTA was not given per Neurology. Source of CVA is likely secondary to large vessel occlusion, but can not exclude cardioembolic. Echo shows EF 55 - 60%, mild LVH, mild to moderate aortic  regurgitation with mild aortic atherosclerosis. Neurology recommended 30 days cardiac event monitor to rule out a-fib with her cardiologist in [facility].   - Lipid panels unremarkable. LDL 98. Continue Atorvastatin 40 mg. Goal LDL < 70.  - A1C 6.3. Patient is on Novolog SSI - LE Doppler pending  - Continue ASA 325 mg and Plavix 75 mg for 3 months then ASA alone.  - Passed swallow test. Patient deferred PT and OT until tomorrow due to nausea and vomiting.  - Phenergan 12.5 PRN for nausea - Allow permissive HTN for 24-48 h (BP < 220/120)  Cardiac dysrhythmia   Daughter reports that her mom has a known cardiac disease however her physician has been tapering down on some of her medications.  On physical exams, she does have an irregular rhythm on heart auscultation with diastolic murmur at the second left intercostal space. Telemetry shows irregular rhythm with borderline first degree heart block. Rate ~ 70s. No a-fib observed.   - Neurology recommended 30 days cardiac event monitor to rule out a-fib with her cardiologist in [facility]  - Continue telemetry monitoring   Hypertension Per Cardiology notes, patient was on HCTZ 25 mg, Norvasc 5 mg and Toprol 50 mg daily.   - Continue to hold BP meds for permissive HTN (BP < 220/120) - Vitals daily   Diabetes mellitus  - A1c 6.3 - Continue Novolog TID  - Continue monitor CBGs  AKI vs CKD - Unknown baseline - Creatine 1.2 - 1.1 - 1.04 - Continue LR  - BMP in AM  Diet: heart healthy/carb modified IVF:  LR  VTE: Lovenox 40 mg sq  CODE:  Full  Prior to Admission Living Arrangement: home Anticipated Discharge Location: home Barriers to Discharge: PT/OT evaluation  Dispo: Anticipated discharge in approximately 2 day(s).   Doran Stabler, DO 03/18/2020, 7:19 AM Pager: [phone] After 5pm on weekdays and 1pm on weekends: On Call pager [phone]

## 2020-03-18 NOTE — Progress Notes (Signed)
STROKE TEAM PROGRESS NOTE   INTERVAL HISTORY Daughter and granddaughter are at the bedside. Pt lying in bed, very lethargic, eyes closed but able to open on voice. She had vomiting this am while having breakfast, received zofran. She felt much better now, denies any HA, head or neck trauma. Slight dizziness but no room spinning. She denies heart palpitation or racing heart. She follows with Dr. Neale Burly cardiology at [facility] for CAD and aortic valve insufficiency.   As per chart, she also takes steroids for polymyalgia rheumatica and followed with her PCP at [facility].    Vitals:   03/17/20 1920 03/17/20 2007 03/17/20 2131 03/18/20 0408  BP: (!) 140/35 (!) 199/50 (!) 155/49 (!) 162/39  Pulse: 62  70 69  Resp: (!) 21  16 16   Temp:  98 F (36.7 C) 97.9 F (36.6 C) 97.7 F (36.5 C)  TempSrc:  Oral Oral Oral  SpO2: 97%  96% 95%  Weight:    80 kg  Height:       CBC:  Recent Labs  Lab 03/17/20 1313 03/17/20 1313 03/17/20 1320 03/18/20 0427  WBC 7.5  --   --  9.0  NEUTROABS 3.5  --   --   --   HGB 11.1*   < > 11.6* 10.3*  HCT 35.2*   < > 34.0* 32.0*  MCV 83.2  --   --  83.8  PLT 214  --   --  205   < > = values in this interval not displayed.   Basic Metabolic Panel:  Recent Labs  Lab 03/17/20 1313 03/17/20 1313 03/17/20 1320 03/18/20 0427  NA 140   < > 143 141  K 3.3*   < > 3.5 3.8  CL 105   < > 104 102  CO2 22  --   --  22  GLUCOSE 140*   < > 138* 112*  BUN 23   < > 29* 19  CREATININE 1.16*   < > 1.20* 1.04*  CALCIUM 9.1  --   --  9.1   < > = values in this interval not displayed.   Lipid Panel:  Recent Labs  Lab 03/17/20 1313  CHOL 170  TRIG 79  HDL 56  CHOLHDL 3.0  VLDL 16  LDLCALC 98   HgbA1c:  Recent Labs  Lab 03/17/20 1313  HGBA1C 6.3*   Urine Drug Screen:  Recent Labs  Lab 03/17/20 1626  LABOPIA NONE DETECTED  COCAINSCRNUR NONE DETECTED  LABBENZ NONE DETECTED  AMPHETMU NONE DETECTED  THCU NONE DETECTED  LABBARB NONE DETECTED     Alcohol Level  Recent Labs  Lab 03/17/20 1312  ETH <10    IMAGING past 24 hours CT Code Stroke CTA Head W/WO contrast  Addendum Date: 03/17/2020   ADDENDUM REPORT: 03/17/2020 16:16 ADDENDUM: Review of this examination after the MRI shows that there is no visible flow in the right superior cerebellar artery. Electronically Signed   By: 05/18/2020 M.D.   On: 03/17/2020 16:16   Result Date: 03/17/2020 CLINICAL DATA:  Code stroke. Headache and right-sided deficits. Negative head CT. EXAM: CT ANGIOGRAPHY HEAD AND NECK TECHNIQUE: Multidetector CT imaging of the head and neck was performed using the standard protocol during bolus administration of intravenous contrast. Multiplanar CT image reconstructions and MIPs were obtained to evaluate the vascular anatomy. Carotid stenosis measurements (when applicable) are obtained utilizing NASCET criteria, using the distal internal carotid diameter as the denominator. CONTRAST:  75mL OMNIPAQUE IOHEXOL  350 MG/ML SOLN COMPARISON:  Head CT same day FINDINGS: CTA NECK FINDINGS Aortic arch: Aortic atherosclerosis and ectasia. Maximal diameter in the arch on the order of 4.2 cm. Right carotid system: Common carotid artery widely patent to the bifurcation. Atherosclerotic plaque at the carotid bifurcation but no ICA stenosis compared to the more distal cervical ICA. Left carotid system: Common carotid artery widely patent to the bifurcation. Calcified plaque at the distal bulb with minimal diameter of 3.3 mm. Compared to a more distal cervical ICA diameter of 4.1 mm, this indicates a 20% stenosis. Vertebral arteries: Left vertebral artery origin is widely patent. Left vertebral artery is widely patent through the cervical region to the foramen magnum. 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 Skeleton: Ordinary cervical spondylosis. Other neck: No mass or lymphadenopathy. Enlarged  heterogeneous thyroid gland consistent with goiter. Upper chest: Negative Review of the MIP images confirms the above findings CTA HEAD FINDINGS Anterior circulation: Both internal carotid arteries are patent through the skull base and siphon regions. Minimal siphon atherosclerotic calcification without stenosis. The anterior and middle cerebral vessels are normal. No large or medium vessel occlusion. No proximal stenosis. Posterior circulation: Both vertebral arteries show flow through the foramen magnum to the basilar. No basilar stenosis. Posterior circulation branch vessels are normal. Venous sinuses: Patent and normal. Anatomic variants: None significant. Review of the MIP images confirms the above findings IMPRESSION: No intracranial large or medium vessel occlusion or proximal stenosis. Mild atherosclerotic change at both carotid bifurcations. No stenosis on the right. 20% stenosis distal ICA bulb on the left. 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 Pronounced aortic atherosclerosis and ectasia. Maximal diameter at the arch measures 4.2 cm. Recommend semi-annual imaging followup by CTA or MRA and referral to cardiothoracic surgery if not already obtained. This recommendation follows 2010 ACCF/AHA/AATS/ACR/ASA/SCA/SCAI/SIR/STS/SVM Guidelines for the Diagnosis and Management of Patients With Thoracic Aortic Disease. Circulation. 2010; 121: Z610-R60: E266-e36. Aortic aneurysm NOS (ICD10-I71.9) These results were communicated to Dr. Amada JupiterKirkpatrick at 1:45 pmon 7/11/2021by text page via the [facility] messaging system. Electronically Signed: By: Paulina FusiMark  Shogry M.D. On: 03/17/2020 13:45   CT Code Stroke CTA Neck W/WO contrast  Addendum Date: 03/17/2020   ADDENDUM REPORT: 03/17/2020 16:16 ADDENDUM: Review of this examination after the MRI shows that there is no visible flow in the right superior cerebellar artery. Electronically Signed   By: Paulina FusiMark  Shogry M.D.   On:  03/17/2020 16:16   Result Date: 03/17/2020 CLINICAL DATA:  Code stroke. Headache and right-sided deficits. Negative head CT. EXAM: CT ANGIOGRAPHY HEAD AND NECK TECHNIQUE: Multidetector CT imaging of the head and neck was performed using the standard protocol during bolus administration of intravenous contrast. Multiplanar CT image reconstructions and MIPs were obtained to evaluate the vascular anatomy. Carotid stenosis measurements (when applicable) are obtained utilizing NASCET criteria, using the distal internal carotid diameter as the denominator. CONTRAST:  75mL OMNIPAQUE IOHEXOL 350 MG/ML SOLN COMPARISON:  Head CT same day FINDINGS: CTA NECK FINDINGS Aortic arch: Aortic atherosclerosis and ectasia. Maximal diameter in the arch on the order of 4.2 cm. Right carotid system: Common carotid artery widely patent to the bifurcation. Atherosclerotic plaque at the carotid bifurcation but no ICA stenosis compared to the more distal cervical ICA. Left carotid system: Common carotid artery widely patent to the bifurcation. Calcified plaque at the distal bulb with minimal diameter of 3.3 mm. Compared to a more distal  cervical ICA diameter of 4.1 mm, this indicates a 20% stenosis. Vertebral arteries: Left vertebral artery origin is widely patent. Left vertebral artery is widely patent through the cervical region to the foramen magnum. 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 Skeleton: Ordinary cervical spondylosis. Other neck: No mass or lymphadenopathy. Enlarged heterogeneous thyroid gland consistent with goiter. Upper chest: Negative Review of the MIP images confirms the above findings CTA HEAD FINDINGS Anterior circulation: Both internal carotid arteries are patent through the skull base and siphon regions. Minimal siphon atherosclerotic calcification without stenosis. The anterior and middle cerebral vessels are normal. No large or  medium vessel occlusion. No proximal stenosis. Posterior circulation: Both vertebral arteries show flow through the foramen magnum to the basilar. No basilar stenosis. Posterior circulation branch vessels are normal. Venous sinuses: Patent and normal. Anatomic variants: None significant. Review of the MIP images confirms the above findings IMPRESSION: No intracranial large or medium vessel occlusion or proximal stenosis. Mild atherosclerotic change at both carotid bifurcations. No stenosis on the right. 20% stenosis distal ICA bulb on the left. 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 Pronounced aortic atherosclerosis and ectasia. Maximal diameter at the arch measures 4.2 cm. Recommend semi-annual imaging followup by CTA or MRA and referral to cardiothoracic surgery if not already obtained. This recommendation follows 2010 ACCF/AHA/AATS/ACR/ASA/SCA/SCAI/SIR/STS/SVM Guidelines for the Diagnosis and Management of Patients With Thoracic Aortic Disease. Circulation. 2010; 121: K240-X73. Aortic aneurysm NOS (ICD10-I71.9) These results were communicated to Dr. Amada Jupiter at 1:45 pmon 7/11/2021by text page via the [facility] messaging system. Electronically Signed: By: Paulina Fusi M.D. On: 03/17/2020 13:45   MR BRAIN WO CONTRAST  Result Date: 03/17/2020 CLINICAL DATA:  Right-sided neuro deficits.  Headache and vomiting. EXAM: MRI HEAD WITHOUT CONTRAST TECHNIQUE: Multiplanar, multiecho pulse sequences of the brain and surrounding structures were obtained without intravenous contrast. COMPARISON:  CT studies same day FINDINGS: Brain: Subcentimeter acute infarction within the inferior right cerebellum. 3-4 cm acute infarction at the right superior cerebellum. No acute infarction within either cerebral hemisphere. Elsewhere, there are moderate chronic small-vessel ischemic changes affecting the cerebral hemispheric white matter. Small old cortical infarctions in the  right parietal lobe. No evidence of mass, hemorrhage, hydrocephalus or extra-axial collection. Vascular: Major vessels at the base of the brain show flow. Skull and upper cervical spine: Negative Sinuses/Orbits: Clear/normal Other: None IMPRESSION: 1 cm acute infarction in the inferior cerebellum on the right. 3-4 cm acute infarction of the superior cerebellum on the right. No mass effect or hemorrhage. In review of the CT angiogram, no flow is seen in the right superior cerebellar artery. Moderate chronic small-vessel ischemic changes of the cerebral hemispheric white matter with old small right parietal cortical infarctions. Electronically Signed   By: Paulina Fusi M.D.   On: 03/17/2020 16:15   ECHOCARDIOGRAM COMPLETE  Result Date: 03/18/2020    ECHOCARDIOGRAM REPORT   Patient Name:   ANAB VIVAR Date of Exam: 03/18/2020 Medical Rec #:  [id number]        Height:       66.0 in Accession #:    [id number]       Weight:       176.4 lb Date of Birth:  August 23, 1941        BSA:          1.896 m Patient Age:    11 years  BP:           162/39 mmHg Patient Gender: F                HR:           74 bpm. Exam Location:  Inpatient Procedure: 2D Echo Indications:    stroke 434.91  History:        Patient has no prior history of Echocardiogram examinations.                 Risk Factors:Hypertension.  Sonographer:    Celene Skeen RDCS (AE) Referring Phys: [id number] Elwin Mocha RAINES IMPRESSIONS  1. Normal LV function; mild LVH; sclerotic aortic valve with mild to moderate AI.  2. Left ventricular ejection fraction, by estimation, is 55 to 60%. The left ventricle has normal function. The left ventricle has no regional wall motion abnormalities. There is mild left ventricular hypertrophy. Left ventricular diastolic parameters are indeterminate.  3. Right ventricular systolic function was not well visualized. The right ventricular size is normal.  4. The mitral valve is normal in structure. Trivial mitral valve  regurgitation. No evidence of mitral stenosis.  5. The aortic valve is tricuspid. Aortic valve regurgitation is mild to moderate. Mild aortic valve sclerosis is present, with no evidence of aortic valve stenosis.  6. The inferior vena cava is normal in size with greater than 50% respiratory variability, suggesting right atrial pressure of 3 mmHg. FINDINGS  Left Ventricle: Left ventricular ejection fraction, by estimation, is 55 to 60%. The left ventricle has normal function. The left ventricle has no regional wall motion abnormalities. The left ventricular internal cavity size was normal in size. There is  mild left ventricular hypertrophy. Left ventricular diastolic parameters are indeterminate. Right Ventricle: The right ventricular size is normal. Right ventricular systolic function was not well visualized. Left Atrium: Left atrial size was normal in size. Right Atrium: Right atrial size was not well visualized. Pericardium: Trivial pericardial effusion is present. Mitral Valve: The mitral valve is normal in structure. Normal mobility of the mitral valve leaflets. Mild mitral annular calcification. Trivial mitral valve regurgitation. No evidence of mitral valve stenosis. Tricuspid Valve: The tricuspid valve is normal in structure. Tricuspid valve regurgitation is trivial. No evidence of tricuspid stenosis. Aortic Valve: The aortic valve is tricuspid. Aortic valve regurgitation is mild to moderate. Mild aortic valve sclerosis is present, with no evidence of aortic valve stenosis. Pulmonic Valve: The pulmonic valve was not well visualized. Pulmonic valve regurgitation is trivial. No evidence of pulmonic stenosis. Aorta: The aortic root is normal in size and structure. Venous: The inferior vena cava is normal in size with greater than 50% respiratory variability, suggesting right atrial pressure of 3 mmHg.  LEFT VENTRICLE PLAX 2D LVIDd:         4.70 cm LVIDs:         3.00 cm LV PW:         1.30 cm LV IVS:         1.30 cm LVOT diam:     2.20 cm LV SV:         81 LV SV Index:   43 LVOT Area:     3.80 cm  AORTIC VALVE LVOT Vmax:   116.00 cm/s LVOT Vmean:  81.700 cm/s LVOT VTI:    0.212 m  AORTA Ao Root diam: 3.00 cm  SHUNTS Systemic VTI:  0.21 m Systemic Diam: 2.20 cm Olga Millers MD Electronically signed by Olga Millers MD Signature Date/Time:  03/18/2020/9:09:04 AM    Final    CT HEAD CODE STROKE WO CONTRAST  Result Date: 03/17/2020 CLINICAL DATA:  Code stroke.  Headache with right-sided deficits. EXAM: CT HEAD WITHOUT CONTRAST TECHNIQUE: Contiguous axial images were obtained from the base of the skull through the vertex without intravenous contrast. COMPARISON:  None. FINDINGS: Brain: No evidence of acute infarction, hemorrhage, hydrocephalus, extra-axial collection or mass lesion/mass effect. Mild for age cerebral volume loss and white matter low-density. Vascular: No hyperdense vessel or unexpected calcification. Skull: Normal. Negative for fracture or focal lesion. Sinuses/Orbits: No acute finding. Other: These results were communicated to Dr. Amada Jupiter at 1:22 pmon 7/11/2021by text page via the [facility] messaging system. ASPECTS [facility] Stroke Program Early CT Score) - Ganglionic level infarction (caudate, lentiform nuclei, internal capsule, insula, M1-M3 cortex): 7 - Supraganglionic infarction (M4-M6 cortex): 3 Total score (0-10 with 10 being normal): 10 IMPRESSION: No acute finding.  Unremarkable study for age. Electronically Signed   By: Marnee Spring M.D.   On: 03/17/2020 13:22    PHYSICAL EXAM  Temp:  [97.5 F (36.4 C)-98 F (36.7 C)] 97.7 F (36.5 C) (07/12 0408) Pulse Rate:  [62-74] 69 (07/12 0408) Resp:  [16-22] 16 (07/12 0408) BP: (131-199)/(30-56) 162/39 (07/12 0408) SpO2:  [92 %-98 %] 95 % (07/12 0408) Weight:  [80 kg-84.2 kg] 80 kg (07/12 0408)  General - Well nourished, well developed, lethargic.  Ophthalmologic - fundi not visualized due to noncooperation.  Cardiovascular -  Regular rhythm and rate.  Neuro - lethargic eyes closed but able to open on voice and on request. Moderate dysarthria, but no aphasia, following simple commands, able to name and repeat. But paucity of speech. Visual field full, no gaze palsy but right gaze horizontal nystagmus. No significant facial weakness. Tongue midline. BUE 4-/5 proximal and 4/5 distally. LLE 4/5 and RLE proximal 3/5 (per daughter this is chronic) and b/l foot DF/PF 4/5. Sensation symmetrical, L FTN mild dysmetria and R FTN ataxia. Gait not tested.    ASSESSMENT/PLAN Ms. Chanise Habeck is a 79 y.o. female from [place], [place] with history of HTN, DB presenting with R sided weakness (baseline, stable x 1 yr per dtr), dizziness, HA and vomiting.   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 However, cardioembolic source can not be ruled out this time  Code Stroke CT head No acute abnormality. ASPECTS 10.     CTA head & neck no LVO. L ICA bulb 20% stenosis. R VA occluded at origin. R SCA no flow seen. Pronounced Aortic atherosclerosis.   MRI  R inferior cerebellar infarct. Moderate small vessel disease. Old small R parietal cortical infarcts.   LE Doppler  pending  2D Echo EF 55-60%. No source of embolus. Mild LVH. Mild AV sclerosis.   Recommend 30 day cardiac event monitoring to rule out afib with her cardiologist Dr. Neale Burly at [facility] after discharge.  LDL 98  HgbA1c 6.3  VTE prophylaxis - Lovenox 40 mg sq daily   No antithrombotic prior to admission, now on aspirin 325 mg daily and clopidogrel 75 mg daily following plavix load. Continue DAPT x 3 months then aspirin alone.   Therapy recommendations:  pending  Disposition:  pending   Hypertension  Stable . Permissive hypertension (OK if < 220/120) but gradually normalize in 2-3 days . Long-term BP goal normotensive  Hyperlipidemia  Home meds:  No statin  Now on  lipitor 40  LDL 98, goal < 70  Continue statin at discharge  Diabetes type II Controlled  HgbA1c 6.3, goal < 7.0  CBGs  SSI  Close PCP follow up  Other Stroke Risk Factors  Advanced age  Overweight, Body mass index is 28.47 kg/m., recommend weight loss, diet and exercise as appropriate   Family hx stroke  CAD   Other Active Problems  Aortic valve insufficiency   Polymyalgia rheumatica on prednisone PTA - follow up with PCP  Hospital day # 1  Marvel Plan, MD PhD Stroke Neurology 03/18/2020 12:28 PM    To contact Stroke Continuity provider, please refer to [email or web address]. After hours, contact General Neurology

## 2020-03-18 NOTE — Progress Notes (Signed)
PT Cancellation Note  Patient Details Name: Lacresha Fusilier MRN: [id number] DOB: 04-Jan-1941   Cancelled Treatment:    Reason Eval/Treat Not Completed: (P) Patient at procedure or test/unavailable;Medical issues which prohibited therapy  Pt off floor for vascular study. RN request PT eval deferred until tomorrow due to nausea and active vomiting this morning. PT will follow back tomorrow.   Mykal Batiz B. Beverely Risen PT, DPT Acute Rehabilitation Services Pager [phone] Office [phone]   Elon Alas Fleet 03/18/2020, 12:00 PM

## 2020-03-19 LAB — BASIC METABOLIC PANEL
Anion gap: 11 (ref 5–15)
BUN: 19 mg/dL (ref 8–23)
CO2: 26 mmol/L (ref 22–32)
Calcium: 8.7 mg/dL — ABNORMAL LOW (ref 8.9–10.3)
Chloride: 103 mmol/L (ref 98–111)
Creatinine, Ser: 1.29 mg/dL — ABNORMAL HIGH (ref 0.44–1.00)
GFR calc Af Amer: 45 mL/min — ABNORMAL LOW (ref >60)
GFR calc non Af Amer: 39 mL/min — ABNORMAL LOW (ref >60)
Glucose, Bld: 89 mg/dL (ref 70–99)
Potassium: 3.6 mmol/L (ref 3.5–5.1)
Sodium: 140 mmol/L (ref 135–145)

## 2020-03-19 LAB — GLUCOSE, CAPILLARY
Glucose-Capillary: 82 mg/dL (ref 70–99)
Glucose-Capillary: 102 mg/dL — ABNORMAL HIGH (ref 70–99)
Glucose-Capillary: 106 mg/dL — ABNORMAL HIGH (ref 70–99)
Glucose-Capillary: 93 mg/dL (ref 70–99)

## 2020-03-19 LAB — CBC
HCT: 31.9 % — ABNORMAL LOW (ref 36.0–46.0)
Hemoglobin: 10.1 g/dL — ABNORMAL LOW (ref 12.0–15.0)
MCH: 26.6 pg (ref 26.0–34.0)
MCHC: 31.7 g/dL (ref 30.0–36.0)
MCV: 84.2 fL (ref 80.0–100.0)
Platelets: 205 10*3/uL (ref 150–400)
RBC: 3.79 MIL/uL — ABNORMAL LOW (ref 3.87–5.11)
RDW: 14.6 % (ref 11.5–15.5)
WBC: 6.9 10*3/uL (ref 4.0–10.5)
nRBC: 0 % (ref 0.0–0.2)

## 2020-03-19 NOTE — TOC Progression Note (Signed)
Transition of Care [facility]) - Progression Note    Patient Details  Name: Natasha Jones MRN: [id number] Date of Birth: 1941-05-02  Transition of Care [facility]) CM/SW Contact  Terrial Rhodes, LCSWA Phone Number: 03/19/2020, 5:37 PM  Clinical Narrative:     CSW started insurance authorization. Insurance authorization start date is for tomorrow 7/14. Fish farm manager number is # [id number].  CSW faxed over initial referral to [facility] in [place]. CSW still awaiting call back from [facility] rehab in [place].  Pending bed offers. Pending insurance authorization.  CSW will continue to follow.  Expected Discharge Plan: Skilled Nursing Facility Barriers to Discharge: Continued Medical Work up  Expected Discharge Plan and Services Expected Discharge Plan: Skilled Nursing Facility       Living arrangements for the past 2 months: Single Family Home                                       Social Determinants of Health (SDOH) Interventions    Readmission Risk Interventions No flowsheet data found.

## 2020-03-19 NOTE — Progress Notes (Signed)
Subjective:   Hospital day: 2  Overnight event: Patient had a episode of emesis yesterday and her HR was low in the 30s. Phenergan was given. When rechecked, her heart rate stabilizes at 60 - 70s. Overnight, bladder scan shows a volume of 840 cc and still elevated at 584 cc after she voided. I/O catheter was used and collected 600 cc of urine.  Patient examined at bedside. Denies any acute complaints this morning. She states that she did not have any more episodes of nausea/vomitting and she was eating well. Agreeable to PT/OT evaluation today.  Objective:  Vital signs in last 24 hours: Vitals:   03/18/20 2052 03/19/20 0013 03/19/20 0029 03/19/20 0436  BP:  (!) 155/42 (!) 150/34 (!) 151/36  Pulse:   70 63  Resp: 16  20   Temp:  98 F (36.7 C)  98.4 F (36.9 C)  TempSrc:  Oral  Oral  SpO2: 95% 95% 94%   Weight:    82 kg  Height:        Physical Exam  Physical Exam Constitutional:      General: She is not in acute distress.    Appearance: She is not toxic-appearing.  HENT:     Head: Normocephalic.  Eyes:     General: No scleral icterus.    Conjunctiva/sclera: Conjunctivae normal.  Neck:     Comments: Limited left cervical rotation  Cardiovascular:     Rate and Rhythm: Regular rhythm.     Comments: 3/6 diastolic murmur, heard left upper sternal border radiate to apex Pulmonary:     Effort: Pulmonary effort is normal. No respiratory distress.  Abdominal:     General: Bowel sounds are normal.     Palpations: Abdomen is soft.     Tenderness: There is no abdominal tenderness.  Musculoskeletal:     Right lower leg: No edema.     Left lower leg: No edema.  Skin:    General: Skin is warm.     Coloration: Skin is not jaundiced or pale.  Neurological:     Comments: Strength LUE and RUE 4-5/5  Psychiatric:        Mood and Affect: Mood normal.     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 PT recommended SNF.   Principal Problem:   Cerebellar infarct [facility]) Active Problems:   Dizziness   Hypertension   Diabetes (HCC)   Aortic insufficiency   Polymyalgia rheumatica (HCC)  Acute cerebellar infarct Presents with acute onset dizziness, left and right-sided weakness, nausea and nonbloody nonbilious emesis. She has a history of hypertension, diabetes mellitus and an unknown cardiac arrhythmic disease. Brain Mri shows 1 cm acute infarction in the inferior cerebellum and 3-4 cm acute infarction of the superior cerebellum on the right. No hemorrhage noted. CTA shows right vertebral artery occlusion and no visible flow of superior cerebellar artery. PTA was not given per Neurology. Source of CVA is likely secondary to large vessel occlusion, but can not exclude cardioembolic. Echo shows EF 55 - 60%, mild LVH, mild to moderate aortic regurgitation with mild aortic atherosclerosis. LE doppler negative for DVT.Neurology recommended 30 days cardiac event monitor to rule out a-fib with her cardiologist in [facility].   - Lipid  panels unremarkable. LDL 98. Continue Atorvastatin 40 mg. Goal LDL < 70.  - A1C 6.3. Patient is on Novolog SSI - LE Doppler negative for DVT - Continue ASA 325 mg and Plavix 75 mg for 3 months then ASA alone.  - Passed swallow test.  - Phenergan 12.5 PRN for nausea - Allow permissive HTN for 24-48 h (BP < 220/120) - Bladder scan BID. Patient is doing well with Purewick catheter. - Pt recommended SNF. Patient will be transferred to a SNF in [facility] per family request  Cardiac dysrhythmia  Daughter reports that her mom has a known cardiac disease however her physician has been tapering down on some of her medications. On physical exams, she  does have an irregular rhythm on heart auscultation with diastolic murmur at the second left intercostal space. Telemetry shows irregular rhythm with borderline first degree heart block. Rate ~ 60 70s. No a-fib observed.   - Neurology recommended 30 days cardiac event monitor to rule out a-fib with her cardiologist in [facility].  - Continue telemetry monitoring   Hypertension Per Cardiology notes, patient was on HCTZ 25 mg, Norvasc 5 mg and Toprol 50 mg daily.   - BP 150/34 - Continue to hold BP meds for permissive HTN (BP < 220/120) - Vitals daily   Diabetes mellitus  - A1c 6.3 - Continue Novolog TID  - Continue monitor CBGs  AKI vs CKD - Unknown baseline - Creatine 1.2 - 1.1 - 1.04 - Continue LR  - BMP in AM  Diet: heart healthy/carb modified IVF:  LR  VTE: Lovenox 40 mg sq  CODE:  Full  Prior to Admission Living Arrangement: home Anticipated Discharge Location: SNF Barriers to Discharge: SNF placement  Dispo: Anticipated discharge in approximately 1 day(s).   Doran Stabler, DO 03/19/2020, 7:47 AM Pager: [phone] After 5pm on weekdays and 1pm on weekends: On Call pager [phone]

## 2020-03-19 NOTE — Progress Notes (Signed)
Notified by telemetry pt had PAC and then a 2.1 sec pause- pt is wnl- will continue to monitor closely

## 2020-03-19 NOTE — Evaluation (Signed)
Physical Therapy Evaluation Patient Details Name: Natasha Jones MRN: [id number] DOB: 21-Jul-1941 Today's Date: 03/19/2020   History of Present Illness  79 year old woman with medical history significant for chronic right-sided weakness of unclear etiology, unknown cardiac dysrhythmic disease, hypertension, diabetes who presented to the emergency department with sudden onset dizziness, acute on chronic right-sided weakness, headache and vomiting. Pt with acute cerebellar infarct. CT angiogram, no flow is seen in the right superior cerebellar artery. Admitted 03/17/20 for treatment of CVA  Clinical Impression  PTA pt living in [place], [place] with son in single story home with one step to enter. Pt reports using furniture for support with mobility in her home and use of SPC for community ambulation. Pt able to take bird baths and dress independently and fixes mostly microwaved meals, family does most of the shopping.  Pt is currently limited in safe mobility by increased generalized weakness on top of long standing R LE weakness. Pt requires mod A for bed mobility, modA for transfers and modA for stepping transfer from bed to recliner with RW. PT recommending SNF level rehab before returning home. Pt and family requesting transport to SNF in [place], [place] where pt lives. PT will continue to follow acutely.    Follow Up Recommendations SNF    Equipment Recommendations  Other (comment) (TBD at next venue)    Recommendations for Other Services OT consult     Precautions / Restrictions Precautions Precautions: Fall Restrictions Weight Bearing Restrictions: No      Mobility  Bed Mobility Overal bed mobility: Needs Assistance Bed Mobility: Supine to Sit     Supine to sit: Mod assist     General bed mobility comments: modA for bringing trunk to upright and for pad scooting hips to EoB  Transfers Overall transfer level: Needs assistance Equipment used: Rolling walker (2  wheeled) Transfers: Sit to/from Stand Sit to Stand: Mod assist         General transfer comment: modA for power up and steadying with RW, vc for hand placement for power up and for posterior pelvic tilt   Ambulation/Gait Ambulation/Gait assistance: Mod assist Gait Distance (Feet): 2 Feet Assistive device: Rolling walker (2 wheeled) Gait Pattern/deviations: Step-to pattern;Decreased step length - right;Decreased step length - left;Shuffle Gait velocity: slowed Gait velocity interpretation: <1.31 ft/sec, indicative of household ambulator General Gait Details: modA for slow shuffling steps from bed to recliner, pt with increase weakness with progression vc for not sitting before she was in front of recliner   Modified Rankin (Stroke Patients Only) Pre-Morbid Rankin Score: No significant disability Modified Rankin: Moderately severe disability     Balance Overall balance assessment: Needs assistance Sitting-balance support: Feet supported;No upper extremity supported;Bilateral upper extremity supported;Single extremity supported Sitting balance-Leahy Scale: Fair     Standing balance support: Bilateral upper extremity supported Standing balance-Leahy Scale: Poor                               Pertinent Vitals/Pain Pain Assessment: No/denies pain    Home Living Family/patient expects to be discharged to:: Private residence Living Arrangements: Children Available Help at Discharge: Family;Available 24 hours/day Type of Home: House Home Access: Stairs to enter   [employer] of Steps: 1 Home Layout: One level Home Equipment: Walker - 2 wheels;Cane - single point;Bedside commode      Prior Function Level of Independence: Needs assistance   Gait / Transfers Assistance Needed: uses furniture in house, cane in public  and scooter in store  ADL's / Homemaking Assistance Needed: takes bird baths, fixes microwave food            Extremity/Trunk  Assessment   Upper Extremity Assessment Upper Extremity Assessment: Defer to OT evaluation    Lower Extremity Assessment Lower Extremity Assessment: Generalized weakness;RLE deficits/detail;LLE deficits/detail RLE Deficits / Details: long standing R hip weakness and decreased dorsiflex  RLE Coordination: decreased fine motor LLE Deficits / Details: ROM WFL, strength grossly 4.5  LLE Coordination: decreased fine motor       Communication   Communication: No difficulties  Cognition Arousal/Alertness: Awake/alert Behavior During Therapy: WFL for tasks assessed/performed Overall Cognitive Status: Within Functional Limits for tasks assessed                                        General Comments General comments (skin integrity, edema, etc.): Pt daughter in room, attempted BP collection however BP cuff incorrect size and placed on L forearm BP 188/66 attempted after ambulation with larger cuff and pt could not tolerate pressure of cuff passed 206 mmHg, RN notified        Assessment/Plan    PT Assessment Patient needs continued PT services  PT Problem List Decreased strength;Decreased activity tolerance;Decreased balance;Decreased mobility;Decreased coordination;Decreased knowledge of use of DME;Cardiopulmonary status limiting activity       PT Treatment Interventions DME instruction;Gait training;Functional mobility training;Therapeutic activities;Therapeutic exercise;Balance training;Cognitive remediation;Patient/family education    PT Goals (Current goals can be found in the Care Plan section)  Acute Rehab PT Goals Patient Stated Goal: get back to [facility] PT Goal Formulation: With patient/family Time For Goal Achievement: 04/02/20 Potential to Achieve Goals: Fair    Frequency Min 2X/week    AM-PAC PT "6 Clicks" Mobility  Outcome Measure Help needed turning from your back to your side while in a flat bed without using bedrails?: A Little Help needed  moving from lying on your back to sitting on the side of a flat bed without using bedrails?: A Lot Help needed moving to and from a bed to a chair (including a wheelchair)?: A Lot Help needed standing up from a chair using your arms (e.g., wheelchair or bedside chair)?: A Lot Help needed to walk in hospital room?: Total Help needed climbing 3-5 steps with a railing? : Total 6 Click Score: 11    End of Session Equipment Utilized During Treatment: Gait belt Activity Tolerance: Patient tolerated treatment well Patient left: in chair;with call bell/phone within reach;with chair alarm set;with family/visitor present Nurse Communication: Mobility status PT Visit Diagnosis: Unsteadiness on feet (R26.81);Other abnormalities of gait and mobility (R26.89);Muscle weakness (generalized) (M62.81);Difficulty in walking, not elsewhere classified (R26.2);Other symptoms and signs involving the nervous system (R29.898)    Time: 2585-2778 PT Time Calculation (min) (ACUTE ONLY): 34 min   Charges:   PT Evaluation $PT Eval Moderate Complexity: 1 Mod PT Treatments $Therapeutic Activity: 8-22 mins        Abdulah Iqbal B. Beverely Risen PT, DPT Acute Rehabilitation Services Pager [phone] Office [phone]   Elon Alas Fleet 03/19/2020, 1:25 PM

## 2020-03-19 NOTE — Evaluation (Signed)
Occupational Therapy Evaluation Patient Details Name: Natasha Jones MRN: [id number] DOB: 1941/05/23 Today's Date: 03/19/2020    History of Present Illness 79 year old woman with medical history significant for chronic right-sided weakness of unclear etiology, unknown cardiac dysrhythmic disease, hypertension, diabetes who presented to the emergency department with sudden onset dizziness, acute on chronic right-sided weakness, headache and vomiting. Pt with acute cerebellar infarct. CT angiogram, no flow is seen in the right superior cerebellar artery. Admitted 03/17/20 for treatment of CVA   Clinical Impression   Pt PTA: Pt living with family, reports independence prior. Pt currently  limited by decreased strength, decreased ability to care for self, decreased activity tolerance and decreased endurance. Pt with RUE weakness and poor coordination noted- pt reports worsened since new CVA. Pt set-upA to maxA for ADL tasks; pt modA overall for transfers and safest using stedy. Pt would benefit from continued OT skilled services for ADL, mobility and safety in SNF setting. OT following acutely. Pt 100% O2 on RA ad HR 72-80 BPM with exertion.      Follow Up Recommendations  SNF;Supervision/Assistance - 24 hour    Equipment Recommendations  Other (comment) (defer to next facility)    Recommendations for Other Services       Precautions / Restrictions Precautions Precautions: Fall Restrictions Weight Bearing Restrictions: No      Mobility Bed Mobility               General bed mobility comments: in recliner pre and post session  Transfers Overall transfer level: Needs assistance Equipment used: Rolling walker (2 wheeled) Transfers: Sit to/from Stand Sit to Stand: Mod assist         General transfer comment: modA for power up; use of stedy for transfer recliner <-> BSC; pt unable to take steps safely    Balance Overall balance assessment: Needs  assistance Sitting-balance support: Feet supported;No upper extremity supported;Bilateral upper extremity supported;Single extremity supported Sitting balance-Leahy Scale: Fair     Standing balance support: Bilateral upper extremity supported Standing balance-Leahy Scale: Poor Standing balance comment: unable to take steps; posterior lean                           ADL either performed or assessed with clinical judgement   ADL Overall ADL's : Needs assistance/impaired Eating/Feeding: Set up;Sitting   Grooming: Minimal assistance;Sitting   Upper Body Bathing: Minimal assistance;Sitting   Lower Body Bathing: Maximal assistance;Sitting/lateral leans;Sit to/from stand;Cueing for safety   Upper Body Dressing : Minimal assistance;Sitting   Lower Body Dressing: Maximal assistance;Sitting/lateral leans;Sit to/from stand;Cueing for safety   Toilet Transfer: Maximal assistance;Cueing for safety   Toileting- Clothing Manipulation and Hygiene: Maximal assistance;Sitting/lateral lean;Sit to/from stand Toileting - Clothing Manipulation Details (indicate cue type and reason): standing up in stedy with assist for pericare     Functional mobility during ADLs: Maximal assistance;+2 for physical assistance;+2 for safety/equipment;Cueing for safety General ADL Comments: Pt limited by decreased strength, decreased ability to care for self, decreased activity tolerance and decreased endurance. Pt 100% O2 on RA ad HR 72-80 BPM with exertion.     Vision Baseline Vision/History: Wears glasses Wears Glasses: Reading only;Distance only Patient Visual Report: No change from baseline Vision Assessment?: Yes Eye Alignment: Within Functional Limits Ocular Range of Motion: Within Functional Limits Alignment/Gaze Preference: Within Defined Limits Tracking/Visual Pursuits: Able to track stimulus in all quads without difficulty     Perception     Praxis  Pertinent Vitals/Pain Pain  Assessment: No/denies pain     Hand Dominance Right   Extremity/Trunk Assessment Upper Extremity Assessment Upper Extremity Assessment: Generalized weakness;RUE deficits/detail;LUE deficits/detail RUE Deficits / Details: 3-/5 MM grade in shoulders; 3/5 elbow, wrist and hand RUE Coordination: decreased fine motor;decreased gross motor LUE Deficits / Details: 3+/5 MM grade in shoulders, elbow, wrist and hand LUE Coordination: decreased gross motor   Lower Extremity Assessment RLE Deficits / Details: long standing R hip weakness and decreased dorsiflex    Cervical / Trunk Assessment Cervical / Trunk Assessment: Kyphotic   Communication Communication Communication: No difficulties   Cognition Arousal/Alertness: Awake/alert Behavior During Therapy: WFL for tasks assessed/performed Overall Cognitive Status: Within Functional Limits for tasks assessed                                     General Comments  Pt's daughter and grand daughter in room    Exercises     Shoulder Instructions      Home Living Family/patient expects to be discharged to:: Private residence Living Arrangements: Children Available Help at Discharge: Family;Available 24 hours/day Type of Home: House Home Access: Stairs to enter [employer] of Steps: 1   Home Layout: One level     Bathroom Shower/Tub: Chief Strategy Officer: Standard Bathroom Accessibility: Yes   Home Equipment: Environmental consultant - 2 wheels;Cane - single point;Bedside commode          Prior Functioning/Environment Level of Independence: Needs assistance  Gait / Transfers Assistance Needed: uses furniture in house, cane in public and scooter in store ADL's / Homemaking Assistance Needed: takes bird baths, fixes microwave food             OT Problem List: Decreased strength;Decreased activity tolerance;Impaired balance (sitting and/or standing);Impaired vision/perception;Decreased  coordination;Decreased safety awareness;Impaired UE functional use;Increased edema      OT Treatment/Interventions: Self-care/ADL training;Therapeutic exercise;Energy conservation;DME and/or AE instruction;Therapeutic activities;Visual/perceptual remediation/compensation;Patient/family education;Balance training;Neuromuscular education    OT Goals(Current goals can be found in the care plan section) Acute Rehab OT Goals Patient Stated Goal: get back to [place] OT Goal Formulation: With patient Time For Goal Achievement: 04/02/20 Potential to Achieve Goals: Good ADL Goals Pt Will Perform Grooming: with supervision;standing Pt Will Perform Lower Body Dressing: with supervision;sitting/lateral leans;sit to/from stand Pt Will Transfer to Toilet: with supervision;ambulating;bedside commode Pt/caregiver will Perform Home Exercise Program: Increased strength;Both right and left upper extremity;With Supervision Additional ADL Goal #1: Pt will increase to supervisionA for transfers to increase independence with ADL.  OT Frequency: Min 2X/week   Barriers to D/C:            Co-evaluation              AM-PAC OT "6 Clicks" Daily Activity     Outcome Measure Help from another person eating meals?: A Little Help from another person taking care of personal grooming?: A Little Help from another person toileting, which includes using toliet, bedpan, or urinal?: A Lot Help from another person bathing (including washing, rinsing, drying)?: A Lot Help from another person to put on and taking off regular upper body clothing?: A Little Help from another person to put on and taking off regular lower body clothing?: A Lot 6 Click Score: 15   End of Session Equipment Utilized During Treatment: Gait belt;Rolling walker Nurse Communication: Mobility status  Activity Tolerance: Patient limited by fatigue;Patient tolerated treatment well Patient left:  in chair;with call bell/phone within reach;with  chair alarm set  OT Visit Diagnosis: Unsteadiness on feet (R26.81);Muscle weakness (generalized) (M62.81)                Time: 8832-5498 OT Time Calculation (min): 42 min Charges:  OT General Charges $OT Visit: 1 Visit OT Evaluation $OT Eval Moderate Complexity: 1 Mod OT Treatments $Self Care/Home Management : 23-37 mins  Flora Lipps, OTR/L Acute Rehabilitation Services Pager: [phone] Office: [phone]   Silver Parkey C 03/19/2020, 5:30 PM

## 2020-03-19 NOTE — Progress Notes (Signed)
STROKE TEAM PROGRESS NOTE   INTERVAL HISTORY Daughter and granddaughter are at the bedside. Pt sitting in chair, eating ok, no more N/V. Feeling much improved but not 100% normal.  She said when she walks with PT, she still has right LE dragging. PT recommend SNF. Pt would like to back to [facility] for SNF.   Vitals:   03/19/20 0013 03/19/20 0029 03/19/20 0436 03/19/20 0815  BP: (!) 155/42 (!) 150/34 (!) 151/36 (!) 153/62  Pulse:  70 63 70  Resp:  20    Temp: 98 F (36.7 C)  98.4 F (36.9 C) 98.5 F (36.9 C)  TempSrc: Oral  Oral Oral  SpO2: 95% 94%  97%  Weight:   82 kg   Height:       CBC:  Recent Labs  Lab 03/17/20 1313 03/17/20 1320 03/18/20 0427 03/19/20 0626  WBC 7.5   < > 9.0 6.9  NEUTROABS 3.5  --   --   --   HGB 11.1*   < > 10.3* 10.1*  HCT 35.2*   < > 32.0* 31.9*  MCV 83.2   < > 83.8 84.2  PLT 214   < > 205 205   < > = values in this interval not displayed.   Basic Metabolic Panel:  Recent Labs  Lab 03/18/20 0427 03/19/20 0626  NA 141 140  K 3.8 3.6  CL 102 103  CO2 22 26  GLUCOSE 112* 89  BUN 19 19  CREATININE 1.04* 1.29*  CALCIUM 9.1 8.7*   Lipid Panel:  Recent Labs  Lab 03/17/20 1313  CHOL 170  TRIG 79  HDL 56  CHOLHDL 3.0  VLDL 16  LDLCALC 98   HgbA1c:  Recent Labs  Lab 03/17/20 1313  HGBA1C 6.3*   Urine Drug Screen:  Recent Labs  Lab 03/17/20 1626  LABOPIA NONE DETECTED  COCAINSCRNUR NONE DETECTED  LABBENZ NONE DETECTED  AMPHETMU NONE DETECTED  THCU NONE DETECTED  LABBARB NONE DETECTED    Alcohol Level  Recent Labs  Lab 03/17/20 1312  ETH <10    IMAGING past 24 hours VAS [place] LOWER EXTREMITY VENOUS (DVT)  Result Date: 03/19/2020  Lower Venous DVT Study Indications: Stroke.  Limitations: Body habitus and patient pain with compression. Comparison Study: No prior study on file Performing Technologist: Sherren Kerns RVS  Examination Guidelines: A complete evaluation includes B-mode imaging, spectral Doppler, color Doppler, and  power Doppler as needed of all accessible portions of each vessel. Bilateral testing is considered an integral part of a complete examination. Limited examinations for reoccurring indications may be performed as noted. The reflux portion of the exam is performed with the patient in reverse Trendelenburg.  +---------+---------------+---------+-----------+----------+--------------+ RIGHT    CompressibilityPhasicitySpontaneityPropertiesThrombus Aging +---------+---------------+---------+-----------+----------+--------------+ CFV      Full           Yes      Yes                                 +---------+---------------+---------+-----------+----------+--------------+ SFJ      Full                                                        +---------+---------------+---------+-----------+----------+--------------+ FV Prox  Full                                                        +---------+---------------+---------+-----------+----------+--------------+  FV Mid   Full                                                        +---------+---------------+---------+-----------+----------+--------------+ FV DistalFull                                                        +---------+---------------+---------+-----------+----------+--------------+ PFV      Full                                                        +---------+---------------+---------+-----------+----------+--------------+ POP      Full           Yes      Yes                                 +---------+---------------+---------+-----------+----------+--------------+ PTV      Full                                                        +---------+---------------+---------+-----------+----------+--------------+ PERO     Full                                                        +---------+---------------+---------+-----------+----------+--------------+    +---------+---------------+---------+-----------+----------+-------------------+ LEFT     CompressibilityPhasicitySpontaneityPropertiesThrombus Aging      +---------+---------------+---------+-----------+----------+-------------------+ CFV      Full           Yes      Yes                                      +---------+---------------+---------+-----------+----------+-------------------+ SFJ      Full                                                             +---------+---------------+---------+-----------+----------+-------------------+ FV Prox  Full                                                             +---------+---------------+---------+-----------+----------+-------------------+ FV Mid   Full                                                             +---------+---------------+---------+-----------+----------+-------------------+  FV DistalFull                                                             +---------+---------------+---------+-----------+----------+-------------------+ PFV      Full                                                             +---------+---------------+---------+-----------+----------+-------------------+ POP                     Yes      Yes                  patent by Color and                                                       Doppler             +---------+---------------+---------+-----------+----------+-------------------+ PTV      Full                                                             +---------+---------------+---------+-----------+----------+-------------------+ PERO     Full                                                             +---------+---------------+---------+-----------+----------+-------------------+     Summary: BILATERAL: - No evidence of deep vein thrombosis seen in the lower extremities, bilaterally. -   *See table(s) above for measurements and observations.  Electronically signed by Fabienne Bruns MD on 03/19/2020 at 10:10:06 AM.    Final     PHYSICAL EXAM  Temp:  [97.7 F (36.5 C)-98.5 F (36.9 C)] 98.5 F (36.9 C) (07/13 0815) Pulse Rate:  [63-77] 70 (07/13 0815) Resp:  [16-20] 20 (07/13 0029) BP: (144-155)/(34-62) 153/62 (07/13 0815) SpO2:  [94 %-97 %] 97 % (07/13 0815) Weight:  [82 kg] 82 kg (07/13 0436)  General - Well nourished, well developed, not in acute distress  Ophthalmologic - fundi not visualized due to noncooperation.  Cardiovascular - Regular rhythm and rate.  Neuro - awake alert, eyes open. Mild dysarthria, no aphasia, following simple commands, able to name and repeat. Visual field full, no gaze palsy but right gaze horizontal nystagmus. No significant facial weakness. Tongue midline. LUE 4/5 but RUE 3/5 proximal and 4/5 distally due to right shoulder pain from fall. LLE 4/5 and RLE proximal 3/5 (per daughter this is chronic) and b/l foot DF/PF 4/5. Sensation symmetrical, L FTN mild dysmetria and R FTN ataxia. Gait not tested.    ASSESSMENT/PLAN Natasha Jones  is a 79 y.o. [place] with history of HTN, DB presenting with R sided weakness (baseline, stable x 1 yr per dtr), dizziness, HA and vomiting.   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 However, cardioembolic source can not be ruled out this time  Code Stroke CT head No acute abnormality. ASPECTS 10.     CTA head & neck no LVO. L ICA bulb 20% stenosis. R VA occluded at origin. R SCA no flow seen. Pronounced Aortic atherosclerosis.   MRI  R inferior cerebellar infarct. Moderate small vessel disease. Old small R parietal cortical infarcts.   LE Doppler  No DVT  2D Echo EF 55-60%. No source of embolus. Mild LVH. Mild AV sclerosis.   Recommend 30 day cardiac event monitoring to rule out afib with her cardiologist [facility] after  discharge.  LDL 98  HgbA1c 6.3  VTE prophylaxis - Lovenox 40 mg sq daily   No antithrombotic prior to admission, now on aspirin 325 mg daily and clopidogrel 75 mg daily following plavix load. Continue DAPT x 3 months then aspirin alone.   Therapy recommendations:  SNF   Disposition:  pending   Hypertension  Stable . Permissive hypertension (OK if < 220/120) but gradually normalize in 2-3 days . Long-term BP goal normotensive  Hyperlipidemia  Home meds:  No statin  Now on lipitor 40  LDL 98, goal < 70  Continue statin at discharge  Diabetes type II Controlled  HgbA1c 6.3, goal < 7.0  CBGs  SSI  Close PCP follow up  Other Stroke Risk Factors  Advanced age  Overweight, Body mass index is 29.18 kg/m., recommend weight loss, diet and exercise as appropriate   Family hx stroke  CAD (follows [facility])  Other Active Problems  Aortic valve insufficiency (follows with [facility])  Polymyalgia rheumatica on prednisone PTA - follow up with PCP ([facility])  Hospital day # 2  Neurology will sign off. Please call with questions. Pt will follow [facility] with [facility] who will refer her to a local neurologist. Thanks for the consult.   Marvel PlanJindong Marquelle Musgrave, MD PhD Stroke Neurology 03/19/2020 11:37 AM    To contact Stroke Continuity provider, please refer to [email or web address]. After hours, contact General Neurology

## 2020-03-19 NOTE — Progress Notes (Signed)
Patient has only voided thus far this shift.  Bladder scan done showed in bladder.  RN text paged Internal Medicine Residency, order entered per on call.  RN about to I and O cath patient and RN asked patient to push like she was having a baby and every time patient pushed patient voided urine.  Once patient was no longer able to push any urine out bladder scan done and was .  RN proceeded with I and O cath and obtained or urine.

## 2020-03-19 NOTE — NC FL2 (Signed)
[place] MEDICAID FL2 LEVEL OF CARE SCREENING TOOL     IDENTIFICATION  Patient Name: Natasha Jones Birthdate: [birth year] Sex: female Admission Date (Current Location): 03/17/2020  [facility] and [place] Number:  Producer, television/film/video and Address:  [facility] [place]. [facility], [address] [place], [place] [address]      Provider Number: [id number]  Attending Physician Name and Address:  Anne Shutter, MD  Relative Name and Phone Number:  Steward Drone daughter [phone] Ave Filter [phone]    Current Level of Care: Hospital Recommended Level of Care: Skilled Nursing Facility Prior Approval Number:    Date Approved/Denied:   PASRR Number: [id number]  Discharge Plan: SNF    Current Diagnoses: Patient Active Problem List   Diagnosis Date Noted  . Aortic insufficiency 03/18/2020  . Polymyalgia rheumatica (HCC) 03/18/2020  . Dizziness 03/17/2020  . Hypertension 03/17/2020  . Diabetes (HCC) 03/17/2020  . 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    Orientation RESPIRATION BLADDER Height & Weight     Self, Time, Situation, Place  Normal Continent, External catheter (External Urinary Catheter) Weight: 180 lb 12.4 oz (82 kg) Height:  5\' 6"  (167.6 cm)  BEHAVIORAL SYMPTOMS/MOOD NEUROLOGICAL BOWEL NUTRITION STATUS      Continent Diet (See Discharge Summary)  AMBULATORY STATUS COMMUNICATION OF NEEDS Skin     Verbally Skin abrasions, Other (Comment) (Appropriate for ethnicity, dry,per patient band-aid around second left toe for comfort skin sores from shingles on right chest arm and upper back,Abrasion elbow right cleansed non-tenting)                       Personal Care Assistance Level of Assistance              Functional Limitations Info  Sight, Hearing, Speech Sight Info: Adequate Hearing Info: Adequate Speech Info: Adequate    SPECIAL CARE FACTORS FREQUENCY  PT (By licensed PT), OT (By licensed OT)     PT Frequency: 5x min  weekly OT Frequency: 5x min weekly            Contractures Contractures Info: Not present    Additional Factors Info  Code Status, Allergies Code Status Info: FULL Allergies Info: Ativan,Chlorpheniramine Maleate,Pseudoephedrine Hcl           Current Medications (03/19/2020):  This is the current hospital active medication list Current Facility-Administered Medications  Medication Dose Route Frequency Provider Last Rate Last Admin  . aspirin EC tablet 325 mg  325 mg Oral Daily 03/21/2020, MD   325 mg at 03/19/20 03/21/20   Or  . aspirin suppository 300 mg  300 mg Rectal Daily 4536, MD   300 mg at 03/17/20 2322  . atorvastatin (LIPITOR) tablet 40 mg  40 mg Oral Daily 2323, MD   40 mg at 03/19/20 0937  . clopidogrel (PLAVIX) tablet 75 mg  75 mg Oral Daily 03/21/20, MD   75 mg at 03/19/20 0937  . enoxaparin (LOVENOX) injection 40 mg  40 mg Subcutaneous Q24H Agyei, Obed K, MD   40 mg at 03/18/20 1749  . insulin aspart (novoLOG) injection 0-9 Units  0-9 Units Subcutaneous TID WC Agyei, Obed K, MD      . lactated ringers infusion   Intravenous Continuous 05/19/20, MD 75 mL/hr at 03/19/20 1206 New Bag at 03/19/20 1206  . promethazine (PHENERGAN) injection 12.5 mg  12.5 mg Intravenous Q8H PRN Agyei, Obed K, MD   12.5 mg at 03/18/20 1051  Discharge Medications: Please see discharge summary for a list of discharge medications.  Relevant Imaging Results:  Relevant Lab Results:   Additional Information [id number]  Terrial Rhodes, LCSWA

## 2020-03-19 NOTE — TOC Initial Note (Signed)
Transition of Care [facility]) - Initial/Assessment Note    Patient Details  Name: Natasha Jones MRN: [id number] Date of Birth: 1941-02-16  Transition of Care [facility]) CM/SW Contact:    Terrial Rhodes, LCSWA Phone Number: 03/19/2020, 4:25 PM  Clinical Narrative:                  CSW spoke with patient and patients daughter and granddaughter at bedside. Patient is agreeable to SNF in [facility] Patient says she lives with her son. Patient gave CSW permission to discuss her care with her daughter Steward Drone and Charlotte Sanes. Patients daughter and Pollyann Savoy provide CSW with two facilities in [place] that they want CSW to fax initial referral too.   Pending Bed Offers. CSW will start insurance authorization.  CSW will continue to follow.  Expected Discharge Plan: Skilled Nursing Facility Barriers to Discharge: Continued Medical Work up   Patient Goals and CMS Choice Patient states their goals for this hospitalization and ongoing recovery are:: to go to SNF CMS Medicare.gov Compare Post Acute Care list provided to:: Patient Choice offered to / list presented to : Patient  Expected Discharge Plan and Services Expected Discharge Plan: Skilled Nursing Facility       Living arrangements for the past 2 months: Single Family Home                                      Prior Living Arrangements/Services Living arrangements for the past 2 months: Single Family Home Lives with:: Self, Adult Children Patient language and need for interpreter reviewed:: Yes Do you feel safe going back to the place where you live?: No   SNF  Need for Family Participation in Patient Care: Yes (Comment) Care giver support system in place?: Yes (comment)   Criminal Activity/Legal Involvement Pertinent to Current Situation/Hospitalization: No - Comment as needed  Activities of Daily Living Home Assistive Devices/Equipment: None ADL Screening (condition at time of admission) Patient's  cognitive ability adequate to safely complete daily activities?: Yes Is the patient deaf or have difficulty hearing?: No Does the patient have difficulty seeing, even when wearing glasses/contacts?: No Does the patient have difficulty concentrating, remembering, or making decisions?: No Patient able to express need for assistance with ADLs?: Yes Does the patient have difficulty dressing or bathing?: No Independently performs ADLs?: Yes (appropriate for developmental age) Does the patient have difficulty walking or climbing stairs?: No Weakness of Legs: Both Weakness of Arms/Hands: None  Permission Sought/Granted Permission sought to share information with : Case Manager, Magazine features editor, Family Supports Permission granted to share information with : Yes, Verbal Permission Granted  Share Information with NAME: Steward Drone Daughter and Ave Filter  Permission granted to share info w AGENCY: SNF  Permission granted to share info w Relationship: Daughter and Psychologist, educational granted to share info w Contact Information: Steward Drone [phone] Bonita Quin [phone]  Emotional Assessment Appearance:: Appears stated age Attitude/Demeanor/Rapport: Gracious Affect (typically observed): Calm Orientation: : Oriented to Self, Oriented to Place, Oriented to  Time Alcohol / Substance Use: Not Applicable Psych Involvement: No (comment)  Admission diagnosis:  Dizziness [R42] Patient Active Problem List   Diagnosis Date Noted  . Aortic insufficiency 03/18/2020  . Polymyalgia rheumatica (HCC) 03/18/2020  . Dizziness 03/17/2020  . Hypertension 03/17/2020  . Diabetes (HCC) 03/17/2020  . 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   PCP:  Patient, No Pcp Per Pharmacy:   [id number] Jinny Blossom, [place] - [address] AT [facility] OF [facility] & [address] [place] [address] Phone: [phone] Fax: [phone]  [id number] Ginette Otto, [place] - [address] AT [facility] OF [address] & [address] [place] [address] Phone: [phone] Fax: [phone]     Social Determinants of Health (SDOH) Interventions    Readmission Risk Interventions No flowsheet data found.

## 2020-03-20 LAB — BASIC METABOLIC PANEL
Anion gap: 10 (ref 5–15)
BUN: 17 mg/dL (ref 8–23)
CO2: 25 mmol/L (ref 22–32)
Calcium: 8.7 mg/dL — ABNORMAL LOW (ref 8.9–10.3)
Chloride: 103 mmol/L (ref 98–111)
Creatinine, Ser: 1.17 mg/dL — ABNORMAL HIGH (ref 0.44–1.00)
GFR calc Af Amer: 51 mL/min — ABNORMAL LOW (ref >60)
GFR calc non Af Amer: 44 mL/min — ABNORMAL LOW (ref >60)
Glucose, Bld: 90 mg/dL (ref 70–99)
Potassium: 3.6 mmol/L (ref 3.5–5.1)
Sodium: 138 mmol/L (ref 135–145)

## 2020-03-20 LAB — GLUCOSE, CAPILLARY
Glucose-Capillary: 93 mg/dL (ref 70–99)
Glucose-Capillary: 88 mg/dL (ref 70–99)
Glucose-Capillary: 96 mg/dL (ref 70–99)
Glucose-Capillary: 117 mg/dL — ABNORMAL HIGH (ref 70–99)

## 2020-03-20 LAB — CBC
HCT: 31.4 % — ABNORMAL LOW (ref 36.0–46.0)
Hemoglobin: 10 g/dL — ABNORMAL LOW (ref 12.0–15.0)
MCH: 26.8 pg (ref 26.0–34.0)
MCHC: 31.8 g/dL (ref 30.0–36.0)
MCV: 84.2 fL (ref 80.0–100.0)
Platelets: 216 10*3/uL (ref 150–400)
RBC: 3.73 MIL/uL — ABNORMAL LOW (ref 3.87–5.11)
RDW: 14.6 % (ref 11.5–15.5)
WBC: 7.3 10*3/uL (ref 4.0–10.5)
nRBC: 0 % (ref 0.0–0.2)

## 2020-03-20 MED ORDER — LISINOPRIL 40 MG PO TABS
40.0000 mg | ORAL_TABLET | Freq: Every day | ORAL | Status: DC
  Administered 2020-03-20: 40 mg via ORAL
  Filled 2020-03-20: qty 1

## 2020-03-20 MED ORDER — AMLODIPINE BESYLATE 5 MG PO TABS
5.0000 mg | ORAL_TABLET | Freq: Every day | ORAL | Status: DC
  Administered 2020-03-20 – 2020-03-21 (×2): 5 mg via ORAL
  Filled 2020-03-20 (×2): qty 1

## 2020-03-20 MED ORDER — HYDROCHLOROTHIAZIDE 25 MG PO TABS
25.0000 mg | ORAL_TABLET | Freq: Every day | ORAL | Status: DC
  Administered 2020-03-20: 25 mg via ORAL
  Filled 2020-03-20: qty 1

## 2020-03-20 MED ORDER — CHLORHEXIDINE GLUCONATE CLOTH 2 % EX PADS
6.0000 | MEDICATED_PAD | Freq: Every day | CUTANEOUS | Status: DC
  Administered 2020-03-20 – 2020-03-21 (×2): 6 via TOPICAL

## 2020-03-20 NOTE — TOC Progression Note (Signed)
Transition of Care [facility]) - Progression Note    Patient Details  Name: Natasha Jones MRN: [id number] Date of Birth: [birth year]  Transition of Care [facility]) CM/SW Contact  Terrial Rhodes, LCSWA Phone Number: 03/20/2020, 10:53 AM  Clinical Narrative:     CSW received phone call from Tangela with [facility]. Rexene Edison gave CSW fax number to send over referral for review. CSW faxed over clinicals. CSW will follow up with [facility] this afternoon. CSW also will follow up on referral that was faxed to [facility].  Pending bed offers. Pending insurance authorization.  CSW will continue to follow.  Expected Discharge Plan: Skilled Nursing Facility Barriers to Discharge: Continued Medical Work up  Expected Discharge Plan and Services Expected Discharge Plan: Skilled Nursing Facility       Living arrangements for the past 2 months: Single Family Home                                       Social Determinants of Health (SDOH) Interventions    Readmission Risk Interventions No flowsheet data found.

## 2020-03-20 NOTE — TOC Progression Note (Addendum)
Transition of Care [facility]) - Progression Note    Patient Details  Name: Natasha Jones MRN: [id number] Date of Birth: 1941-04-11  Transition of Care [facility]) CM/SW Contact  Terrial Rhodes, LCSWA Phone Number: 03/20/2020, 3:12 PM  Clinical Narrative:      Update 5:13pm-CSW provided patients Granddaughter with out of pocket cost for PTAR transport to [facility]. Patients Granddaughter Bonita Quin will discuss with family. CSW will follow back up with Patients Granddaughter Bonita Quin in the morning about transportation plan for patient.   Patients Grandaughter Bonita Quin accepted SNF bed offer patient at [facility] in [place]. Insurance authorization approval number is [id number]. This approval is good from 14th-16th. On 16th CSW will need to resubmit clinicals to renew insurance authorization. CSW explained that there will be extra cost for transport from here to [facility] rehab in [place]. CSW will follow up with Ave Filter to let her know estimated cost.  Patient has a SN[facility] bed at [facility] in [place]. Insurance authorization has been approved.  CSW will continue to follow. Expected Discharge Plan: Skilled Nursing Facility Barriers to Discharge: Continued Medical Work up  Expected Discharge Plan and Services Expected Discharge Plan: Skilled Nursing Facility       Living arrangements for the past 2 months: Single Family Home                                       Social Determinants of Health (SDOH) Interventions    Readmission Risk Interventions No flowsheet data found.

## 2020-03-20 NOTE — Progress Notes (Addendum)
Subjective:   Hospital day: 3  Overnight event: No  Patient examined at bedside. Patient complains of shoulder pain, which consistent with chronic pain d/t arthritis. She states that she did not eat well, but denies vomiting. No new neuro symptoms. Patient is awaiting SNF placement in [facility].  Objective:  Vital signs in last 24 hours: Vitals:   03/19/20 2001 03/20/20 0007 03/20/20 0433 03/20/20 0718  BP: (!) 186/59 (!) 160/45 (!) 182/34 (!) 174/40  Pulse: 70  66   Resp:   20   Temp: 98.2 F (36.8 C) 98 F (36.7 C) 98.5 F (36.9 C) 98.4 F (36.9 C)  TempSrc: Oral Oral Oral Oral  SpO2: 98%  93%   Weight:   84 kg   Height:        Physical Exam  Physical Exam Constitutional:      General: She is not in acute distress.    Appearance: Normal appearance. She is not toxic-appearing.  HENT:     Head: Normocephalic.  Eyes:     General: No scleral icterus.    Conjunctiva/sclera: Conjunctivae normal.  Cardiovascular:     Rate and Rhythm: Rhythm irregular.     Heart sounds: Murmur (3/6 diastolic murmur heard best at left upper sternal border radiating down to apex) heard.   Pulmonary:     Effort: Pulmonary effort is normal. No respiratory distress.  Abdominal:     Palpations: Abdomen is soft.     Tenderness: There is abdominal tenderness (mild tenderness at lower abdomen).  Musculoskeletal:     Right lower leg: No edema.     Left lower leg: No edema.  Skin:    General: Skin is warm.     Coloration: Skin is not jaundiced.  Neurological:     Comments: PERRLA Strength 4-5/5 bilateral UE  Psychiatric:        Mood and Affect: Mood normal.    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 PT recommended SNF. Awaiting SNF placement in [facility]  Principal Problem:   Cerebellar infarct [facility]) Active Problems:   Dizziness   Hypertension   Diabetes (HCC)   Aortic insufficiency   Polymyalgia rheumatica (HCC)  Acute cerebellar infarct Presents with acute onset dizziness, left and right-sided weakness, nausea and nonbloody nonbilious emesis. She has a history of hypertension, diabetes mellitus and an unknown cardiac arrhythmic disease. Brain Mri shows 1 cm acute infarction in the inferior cerebellum and 3-4 cm acute infarction of the superior cerebellum on the right. No hemorrhage noted. CTA shows right vertebral artery occlusion and no visible flow of superior cerebellar artery. PTA was not given per Neurology. Source of CVA is likely secondary to large vessel occlusion, but can not exclude cardioembolic. Echo shows EF 55 - 60%, mild LVH, mild to moderate aortic regurgitation with mild aortic atherosclerosis. LE doppler negative for DVT.Neurology recommended 30 days cardiac event monitor to rule out a-fib with her cardiologist in [facility].    - Lipid panels unremarkable. LDL 98. Continue Atorvastatin 40 mg. Goal LDL < 70.  - A1C 6.3. Patient is on Novolog SSI - LE Doppler negative for DVT - Continue ASA 325 mg and Plavix 75 mg for 3 months then ASA alone.  -  Phenergan 12.5 PRN for nausea - Foley catheter in place - Pt recommended SNF. Patient will be transferred to a SNF in [facility] per family request. Await placement   Cardiac dysrhythmia   Daughter reports that her mom has a known cardiac disease however her physician has been tapering down on some of her medications.  On physical exams, she does have an irregular rhythm on heart auscultation with diastolic murmur at the second left intercostal space. Telemetry shows irregular rhythm with borderline first degree heart block. Rate ~ 60 70s. No a-fib observed.    - Neurology recommended 30 days  cardiac event monitor to rule out a-fib with her cardiologist in [facility].  - Continue telemetry monitoring    Hypertension Per Cardiology notes, patient was on HCTZ 25 mg, Norvasc 5 mg, Lisinopril 40 mg and Toprol 50 mg daily.    - Patient is out of HTN permissive period, Will restart amlodipine tomorrow.  - Vitals daily    Diabetes mellitus  - A1c 6.3 - Continue Novolog TID  - Continue monitor CBGs   AKI vs CKD - Unknown baseline - Creatine 1.2 - 1.1 - 1.04 - 1.29 -1.17 - Continue LR 75 cc/h - BMP in AM   Diet: heart healthy/carb modified IVF:  LR  VTE: Lovenox 40 mg sq  CODE:  Full   Prior to Admission Living Arrangement: home Anticipated Discharge Location: SNF Barriers to Discharge: SNF placement  Dispo: Anticipated discharge in approximately 1 day(s).   Doran Stabler, DO 03/20/2020, 10:18 AM Pager: [phone] After 5pm on weekdays and 1pm on weekends: On Call pager [phone]

## 2020-03-20 NOTE — Discharge Summary (Addendum)
Name: Natasha Jones MRN: [id number] DOB: 1940-12-23 79 y.o. PCP: Patient, No Pcp Per  Date of Admission: 03/17/2020  1:12 PM Date of Discharge: 03/21/2020 Attending Physician: Anne Shutter, MD  Discharge Diagnosis: 1. Acute Cerebellar Infarct 2. Irregular Sinus Arrhythmia 3. Hypertension 5. AKI on CKD Stage III 6. Urinary Retention   Discharge Medications: Allergies as of 03/21/2020      Reactions   Ativan [lorazepam] Other (See Comments)   Chest tightness, crazy feelings   Chlorpheniramine Maleate Other (See Comments)   Altered heart rate   Pseudoephedrine Hcl Other (See Comments)   Altered heart rate      Medication List    TAKE these medications   amLODipine 5 MG tablet Commonly known as: NORVASC Take 5 mg by mouth daily.   aspirin 325 MG EC tablet Take 1 tablet (325 mg total) by mouth daily. Start taking on: March 22, 2020   atorvastatin 40 MG tablet Commonly known as: LIPITOR Take 1 tablet (40 mg total) by mouth daily. Start taking on: March 22, 2020   clopidogrel 75 MG tablet Commonly known as: PLAVIX Take 1 tablet (75 mg total) by mouth daily. Start taking on: March 22, 2020   hydrochlorothiazide 25 MG tablet Commonly known as: HYDRODIURIL Take 25 mg by mouth daily.   HYDROcodone-acetaminophen 5-325 MG tablet Commonly known as: NORCO/VICODIN Take 1-2 tablets by mouth every 6 (six) hours as needed for severe pain.   lisinopril 40 MG tablet Commonly known as: ZESTRIL Take 40 mg by mouth daily.   nitroGLYCERIN 0.4 MG SL tablet Commonly known as: NITROSTAT Place 0.4 mg under the tongue every 5 (five) minutes as needed for chest pain.   pantoprazole 40 MG tablet Commonly known as: PROTONIX Take 40 mg by mouth daily as needed (acid reflux/heartburn).   Vitamin D3 Super Strength 50 MCG (2000 UT) Tabs Generic drug: Cholecalciferol Take 2,000 Units by mouth daily.       Disposition and follow-up:   Ms.Natasha Jones was discharged from  [facility] in Stable condition.  At the hospital follow up visit please address:  1. Acute Cerebellar Infarct: cont. Aspirin 325 mg and plavix 75 mg for three months, then aspirin alone. Cont. atorva 40 mg qd. 2. Irregular Sinus Arrhythmia 3. Hypertension: lisinopril & norvasc started at discharge. HCTZ held for renal injury and asked patient to follow-up with her PCP before resuming HCTZ. 4. AKI on CKD Stage III: Uknown exact baseline. Improved with fluids.  5. Urinary Retention: Foley placed for urinary retention.  Please follow-up with PCP and can discontinue Foley  2.  Labs / imaging needed at time of follow-up: BMP  3.  Pending labs/ test needing follow-up: None  Follow-up Appointments:   Hospital Course by problem list: 1. Acute Cerebellar Infarct 2. 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 Code stroke was called, neurology was consulted, and she was found to have acute cerebellar infarct within the superior and inferior cerebellum on MRI and right vertebral artery occlusion with no visible flow into the superior cerebellar artery. Source was thought to be secondary to this large vessel occlusion although cardioembolic source could not be excluded. DVT doppler was normal, Echo showed mild to moderate regurgitation with mild sclerosis and mild LVH, otherwise normal echo. She will  follow-up with her cardiologist in [place] for 30 day event monitor. A1C at admission was 6.3. She is not currently taking any medications and did not require insulin during admission.  She was continued on her home atorvastatin 40 mg and was started on aspirin 325 mg and plavix 75 mg. She will continue the plavix for 3 months, then  continue on aspirin alone. She was discharged on Amlodipine, hydrochlorothiazide, lisinopril bp medications.    2. Irregular Sinus Arrhythmia: This was reported by the patient's daughter during admission.  I will advised that she follows up with her PCP and cardiologist.  4. AKI on CKD Stage III: Her renal function improved with fluids  Discharge Vitals:   BP (!) 152/60 (BP Location: Left Arm)   Pulse 75   Temp 98.8 F (37.1 C) (Oral)   Resp 18   Ht 5\' 6"  (1.676 m)   Wt 83.6 kg   SpO2 94%   BMI 29.75 kg/m   Pertinent Labs, Studies, and Procedures:  CHEST - 2 VIEW  COMPARISON:  CTA neck 03/17/2020  FINDINGS: Moderate cardiomegaly. Atherosclerosis ectasias of the thoracic aorta was better appreciated on CTA neck 03/17/2020. There is no appreciable airspace consolidation. No frank pulmonary edema. No evidence of pleural effusion or pneumothorax. No acute bony abnormality identified  IMPRESSION: Moderate cardiomegaly.  No appreciable airspace consolidation or frank pulmonary edema.  Aortic Atherosclerosis (ICD10-I70.0).   CT HEAD WITHOUT CONTRAST  TECHNIQUE: Contiguous axial images were obtained from the base of the skull through the vertex without intravenous contrast.  COMPARISON:  None.  FINDINGS: Brain: No evidence of acute infarction, hemorrhage, hydrocephalus, extra-axial collection or mass lesion/mass effect. Mild for age cerebral volume loss and white matter low-density.  Vascular: No hyperdense vessel or unexpected calcification.  Skull: Normal. Negative for fracture or focal lesion.  Sinuses/Orbits: No acute finding.  Other: These results were communicated to Dr. Amada JupiterKirkpatrick at 1:22 pmon 7/11/2021by text page via the [facility] messaging system.  ASPECTS [facility]([place] Stroke Program Early CT Score)  - Ganglionic level infarction (caudate, lentiform nuclei, internal capsule, insula, M1-M3 cortex): 7  - Supraganglionic infarction (M4-M6  cortex): 3  Total score (0-10 with 10 being normal): 10  IMPRESSION: No acute finding.  Unremarkable study for age.    CT ANGIOGRAPHY HEAD AND NECK  TECHNIQUE: Multidetector CT imaging of the head and neck was performed using the standard protocol during bolus administration of intravenous contrast. Multiplanar CT image reconstructions and MIPs were obtained to evaluate the vascular anatomy. Carotid stenosis measurements (when applicable) are obtained utilizing NASCET criteria, using the distal internal carotid diameter as the denominator.  CONTRAST:  75mL OMNIPAQUE IOHEXOL 350 MG/ML SOLN  COMPARISON:  Head CT same day  FINDINGS: CTA NECK FINDINGS  Aortic arch: Aortic atherosclerosis and ectasia. Maximal diameter in the arch on the order of 4.2 cm.  Right carotid system: Common carotid artery widely patent to the bifurcation. Atherosclerotic plaque at the carotid bifurcation but no ICA stenosis compared to the more distal cervical ICA.  Left carotid system: Common carotid artery widely patent to the bifurcation. Calcified plaque at the distal bulb with minimal diameter of 3.3 mm. Compared to a more distal cervical ICA diameter of 4.1 mm, this indicates a 20% stenosis.  Vertebral arteries: Left vertebral artery origin is widely patent. Left vertebral artery is widely patent through the cervical region to the foramen magnum. 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  Skeleton:  Ordinary cervical spondylosis.  Other neck: No mass or lymphadenopathy. Enlarged heterogeneous thyroid gland consistent with goiter.  Upper chest: Negative  Review of the MIP images confirms the above findings  CTA HEAD FINDINGS  Anterior circulation: Both internal carotid arteries are patent through the skull base and siphon regions. Minimal siphon atherosclerotic calcification without  stenosis. The anterior and middle cerebral vessels are normal. No large or medium vessel occlusion. No proximal stenosis.  Posterior circulation: Both vertebral arteries show flow through the foramen magnum to the basilar. No basilar stenosis. Posterior circulation branch vessels are normal.  Venous sinuses: Patent and normal.  Anatomic variants: None significant.  Review of the MIP images confirms the above findings  IMPRESSION: No intracranial large or medium vessel occlusion or proximal stenosis.  Mild atherosclerotic change at both carotid bifurcations. No stenosis on the right. 20% stenosis distal ICA bulb on the left.  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  Pronounced aortic atherosclerosis and ectasia. Maximal diameter at the arch measures 4.2 cm. Recommend semi-annual imaging followup by CTA or MRA and referral to cardiothoracic surgery if not already obtained. This recommendation follows 2010 ACCF/AHA/AATS/ACR/ASA/SCA/SCAI/SIR/STS/SVM Guidelines for the Diagnosis and Management of Patients With Thoracic Aortic Disease. Circulation. 2010; 121: V616-W73. Aortic aneurysm NOS (ICD10-I71.9)   MRI HEAD WITHOUT CONTRAST  TECHNIQUE: Multiplanar, multiecho pulse sequences of the brain and surrounding structures were obtained without intravenous contrast.  COMPARISON:  CT studies same day  FINDINGS: Brain: Subcentimeter acute infarction within the inferior right cerebellum. 3-4 cm acute infarction at the right superior cerebellum. No acute infarction within either cerebral hemisphere. Elsewhere, there are moderate chronic small-vessel ischemic changes affecting the cerebral hemispheric white matter. Small old cortical infarctions in the right parietal lobe. No evidence of mass, hemorrhage, hydrocephalus or extra-axial collection.  Vascular: Major vessels at the base of the brain show  flow.  Skull and upper cervical spine: Negative  Sinuses/Orbits: Clear/normal  Other: None  IMPRESSION: 1 cm acute infarction in the inferior cerebellum on the right. 3-4 cm acute infarction of the superior cerebellum on the right. No mass effect or hemorrhage. In review of the CT angiogram, no flow is seen in the right superior cerebellar artery.  Moderate chronic small-vessel ischemic changes of the cerebral hemispheric white matter with old small right parietal cortical Infarctions.   Echo 03/18/20 IMPRESSIONS  1. Normal LV function; mild LVH; sclerotic aortic valve with mild to  moderate AI.  2. Left ventricular ejection fraction, by estimation, is 55 to 60%. The  left ventricle has normal function. The left ventricle has no regional  wall motion abnormalities. There is mild left ventricular hypertrophy.  Left ventricular diastolic parameters  are indeterminate.  3. Right ventricular systolic function was not well visualized. The right  ventricular size is normal.  4. The mitral valve is normal in structure. Trivial mitral valve  regurgitation. No evidence of mitral stenosis.  5. The aortic valve is tricuspid. Aortic valve regurgitation is mild to  moderate. Mild aortic valve sclerosis is present, with no evidence of  aortic valve stenosis.  6. The inferior vena cava is normal in size with greater than 50%  respiratory variability, suggesting right atrial pressure of 3 mmHg.   MRI Brain wo contrast IMPRESSION: 1 cm acute infarction in the inferior cerebellum on the right. 3-4 cm acute infarction of the superior cerebellum on the right. No mass effect or hemorrhage. In review of the CT angiogram, no flow is seen in  the right superior cerebellar artery.  Moderate chronic small-vessel ischemic changes of the cerebral hemispheric white matter with old small right parietal cortical infarctions.   Electronically Signed   By: Paulina Fusi M.D.   On:  03/17/2020 16:15  CTA Head&Neck w/wo contrast 03/18/2019 IMPRESSION: No intracranial large or medium vessel occlusion or proximal stenosis.  Mild atherosclerotic change at both carotid bifurcations. No stenosis on the right. 20% stenosis distal ICA bulb on the left.  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  Pronounced aortic atherosclerosis and ectasia. Maximal diameter at the arch measures 4.2 cm. Recommend semi-annual imaging followup by CTA or MRA and referral to cardiothoracic surgery if not already obtained. This recommendation follows 2010 ACCF/AHA/AATS/ACR/ASA/SCA/SCAI/SIR/STS/SVM Guidelines for the Diagnosis and Management of Patients With Thoracic Aortic Disease. Circulation. 2010; 121: I097-D53. Aortic aneurysm NOS (ICD10-I71.9)  These results were communicated to Dr. Amada Jupiter at 1:45 pmon 7/11/2021by text page via the [facility] messaging system.  Electronically Signed: By: Paulina Fusi M.D. On: 03/17/2020 13:45  CT Head 03/17/20  IMPRESSION: No acute finding.  Unremarkable study for age.  Electronically Signed   By: Marnee Spring M.D.   On: 03/17/2020 13:22  Discharge Instructions: Discharge Instructions    Diet - low sodium heart healthy   Complete by: As directed    Discharge instructions   Complete by: As directed    MS. [place],   It was a pleasure taking care of you in the hospital.  You were admitted because of a stroke.  Going forward, you will  take both  aspirin and Plavix for 3 months and after 3 months he can stop taking Plavix and only take aspirin.  Take care!   Increase activity slowly   Complete by: As directed       Signed: Yvette Rack, MD 03/21/2020, 12:02 PM   Pager: [phone]

## 2020-03-21 ENCOUNTER — Other Ambulatory Visit: Payer: Self-pay | Admitting: Internal Medicine

## 2020-03-21 ENCOUNTER — Inpatient Hospital Stay (HOSPITAL_COMMUNITY): Payer: Medicare Other

## 2020-03-21 LAB — BASIC METABOLIC PANEL
Anion gap: 9 (ref 5–15)
BUN: 13 mg/dL (ref 8–23)
CO2: 23 mmol/L (ref 22–32)
Calcium: 9.1 mg/dL (ref 8.9–10.3)
Chloride: 106 mmol/L (ref 98–111)
Creatinine, Ser: 1.05 mg/dL — ABNORMAL HIGH (ref 0.44–1.00)
GFR calc Af Amer: 58 mL/min — ABNORMAL LOW (ref >60)
GFR calc non Af Amer: 50 mL/min — ABNORMAL LOW (ref >60)
Glucose, Bld: 104 mg/dL — ABNORMAL HIGH (ref 70–99)
Potassium: 4 mmol/L (ref 3.5–5.1)
Sodium: 138 mmol/L (ref 135–145)

## 2020-03-21 LAB — SARS CORONAVIRUS 2 BY RT PCR (HOSPITAL ORDER, PERFORMED IN ~~LOC~~ HOSPITAL LAB): SARS Coronavirus 2: NEGATIVE

## 2020-03-21 LAB — GLUCOSE, CAPILLARY
Glucose-Capillary: 85 mg/dL (ref 70–99)
Glucose-Capillary: 108 mg/dL — ABNORMAL HIGH (ref 70–99)

## 2020-03-21 MED ORDER — CLOPIDOGREL BISULFATE 75 MG PO TABS: 75.0000 mg | ORAL_TABLET | Freq: Every day | ORAL | 2 refills | Status: AC

## 2020-03-21 MED ORDER — ASPIRIN 325 MG PO TBEC: 325.0000 mg | DELAYED_RELEASE_TABLET | Freq: Every day | ORAL | 2 refills | Status: AC

## 2020-03-21 MED ORDER — ATORVASTATIN CALCIUM 40 MG PO TABS: 40.0000 mg | ORAL_TABLET | Freq: Every day | ORAL | 0 refills | Status: AC

## 2020-03-21 NOTE — Progress Notes (Signed)
Attempted to give a report to [facility].  Unable to speak with the nurse due to phone issue on their end.  Hinton Dyer, RN

## 2020-03-21 NOTE — Progress Notes (Signed)
   Subjective: HD#4   Overnight: no  Today, Natasha Jones states that she is doing well this morning. Her 2 daughters were also present as well. They were very hopeful and excited to travel back to [place].   Objective:  Vital signs in last 24 hours: Vitals:   03/20/20 2035 03/21/20 0023 03/21/20 0539 03/21/20 0728  BP: (!) 146/78 (!) 166/41 (!) 172/36 (!) 159/38  Pulse: 75 71 74 72  Resp: 20 18 19 16   Temp: 98.6 F (37 C) 98.1 F (36.7 C) 98 F (36.7 C) 98.1 F (36.7 C)  TempSrc: Oral Oral Oral Oral  SpO2: 91% 91% 90% 94%  Weight:   83.6 kg   Height:        Const: In no apparent distress, lying comfortably in bed, conversational HEENT: Atraumatic, normocephalic Resp: normal effort, no respiratory distress CV: 2/6 systolic murmur noted upper sternal borders Abd: Bowel sounds present, nondistended, nontender to palpation Ext: No lower extremity edema, skin is warm to touch Skin: Warm, no rashes Psych: normal mood, normal thought process   Assessment/Plan:  a 79 year old with PMH of 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  Principal Problem:   Cerebellar infarct [facility]) Active Problems:   Dizziness   Hypertension   Diabetes (HCC)   Aortic insufficiency   Polymyalgia rheumatica (HCC)  Acute cerebellar infarct Brain MRI shows 1 cmacute infarction in the inferior cerebellum and3-4 cm acute infarction of the superior cerebellum on the right. No hemorrhage noted. CTA shows right vertebral artery occlusion and no visible flow of superior cerebellar artery. PTA was not given per Neurology. Source of CVA is likely secondary to large vessel occlusion, but can not  exclude cardioembolic. Echo shows EF 55 - 60%, mild LVH, mild to moderate aortic regurgitation with mild aortic atherosclerosis.LE doppler negative for DVT.Neurology recommended 30 days cardiac event monitor to rule out a-fib with her cardiologist in [facility].   - Continue Atorvastatin 40 mg daily at home - Continue ASA 325 mg and Plavix 75 mg for 3 months then ASA alone.  - Foley catheter in place.  - Patient will be transferred to a SNF in [facility] per family transportation.   Cardiac dysrhythmia Daughter reports that her mom has a known cardiac disease however her physician has been tapering down on some of her medications.Telemetry shows irregular rhythm with borderline first degree heart block. Rate ~6070s. No a-fib observed.   - Neurology recommended 30 days cardiac event monitor to rule out a-fib with her cardiologist in [facility].   Hypertension - Continue home meds HCTZ 25 mg, Norvasc 5 mg, Lisinopril 40 mg and Toprol 50 mg daily.   Diabetes mellitus  - A1c 6.3 - Patient need to follow up with her PCP for management of her DM   Diet:heart healthy/carb modified IVF:LR [facility] 40 mg sq CODE:Full  Prior to Admission Living Arrangement:home Anticipated Discharge Location:SNF Barriers to Discharge:SNF placement Dispo: Anticipated discharge in approximately0day(s).   ONG:EXBMWUX, MD 03/21/2020, 11:35 AM Pager: [phone] Internal Medicine Teaching Service After 5pm on weekdays and 1pm on weekends: On Call pager: [phone]

## 2020-03-21 NOTE — Progress Notes (Signed)
Physical Therapy Treatment Patient Details Name: Natasha Jones MRN: [id number] DOB: [birth year] Today's Date: 03/21/2020    History of Present Illness 79 year old woman with medical history significant for chronic right-sided weakness of unclear etiology, unknown cardiac dysrhythmic disease, hypertension, diabetes who presented to the emergency department with sudden onset dizziness, acute on chronic right-sided weakness, headache and vomiting. Pt with acute cerebellar infarct. CT angiogram, no flow is seen in the right superior cerebellar artery. Admitted 03/17/20 for treatment of CVA    PT Comments    Pt supine in bed on entry, agreeable to working with therapy prior to discharge. Pt requires increased cuing today and has difficulty with sequencing. Pt continues to be limited in safe mobility by decreased strength and balance. Pt requires mod-maxAx2 for bed mobility, min-modA for transfers depending on height of bed, and modAx2 for lateral stepping along side of bed. Pt able to static stand with UE support for 2 bouts of 2 min. Pt to discharge to SNF in [place] this afternoon.      Follow Up Recommendations  SNF     Equipment Recommendations  Other (comment) (TBD at next venue)    Recommendations for Other Services OT consult     Precautions / Restrictions Precautions Precautions: Fall Restrictions Weight Bearing Restrictions: No    Mobility  Bed Mobility Overal bed mobility: Needs Assistance Bed Mobility: Supine to Sit;Sit to Supine     Supine to sit: Mod assist Sit to supine: Max assist;+2 for physical assistance   General bed mobility comments: pt able to move LE to EOB, requires mod A for bringing trunk to upright and for scooting hips to EoB, pt with increased confusion about getting back into bed and ultimately requires maxAx2 for coming to supine in bed  Transfers Overall transfer level: Needs assistance Equipment used: Rolling walker (2 wheeled) Transfers: Sit  to/from Stand Sit to Stand: Mod assist;Min assist;+2 physical assistance;From elevated surface         General transfer comment: mod A for power up to [facility] with vc for upright posture with chest up, min A for power up for subsequent power up and steadying from Stedy pads, and modAx2 for power up from elevated bed to standing in RW  Ambulation/Gait Ambulation/Gait assistance: Mod assist;+2 physical assistance Gait Distance (Feet): 3 Feet Assistive device: Rolling walker (2 wheeled) Gait Pattern/deviations: Step-to pattern;Decreased stride length;Trunk flexed Gait velocity: slowed Gait velocity interpretation: <1.31 ft/sec, indicative of household ambulator General Gait Details: modAx2 for lateral stepping 3 feet towards HoB, increased cuing for sequencing      Modified Rankin (Stroke Patients Only) Modified Rankin (Stroke Patients Only) Pre-Morbid Rankin Score: No significant disability Modified Rankin: Moderately severe disability     Balance Overall balance assessment: Needs assistance Sitting-balance support: Feet supported;No upper extremity supported;Bilateral upper extremity supported;Single extremity supported Sitting balance-Leahy Scale: Fair     Standing balance support: Bilateral upper extremity supported Standing balance-Leahy Scale: Poor                              Cognition Arousal/Alertness: Awake/alert Behavior During Therapy: WFL for tasks assessed/performed Overall Cognitive Status: Impaired/Different from baseline Area of Impairment: Problem solving;Awareness                           Awareness: Emergent Problem Solving: Slow processing;Decreased initiation;Difficulty sequencing;Requires verbal cues;Requires tactile cues General Comments: pt requiring more cuing today for task completion  and is slow to respond         General Comments General comments (skin integrity, edema, etc.): HR increased to 122 bpm initially with  activity but settled back to 90s with remainder of mobility training, daughters in room       Pertinent Vitals/Pain Pain Assessment: No/denies pain           PT Goals (current goals can now be found in the care plan section) Acute Rehab PT Goals Patient Stated Goal: get back to [facility] PT Goal Formulation: With patient/family Time For Goal Achievement: 04/02/20 Potential to Achieve Goals: Fair Progress towards PT goals: Progressing toward goals    Frequency    Min 2X/week      PT Plan Current plan remains appropriate       AM-PAC PT "6 Clicks" Mobility   Outcome Measure  Help needed turning from your back to your side while in a flat bed without using bedrails?: A Little Help needed moving from lying on your back to sitting on the side of a flat bed without using bedrails?: A Lot Help needed moving to and from a bed to a chair (including a wheelchair)?: A Lot Help needed standing up from a chair using your arms (e.g., wheelchair or bedside chair)?: A Lot Help needed to walk in hospital room?: Total Help needed climbing 3-5 steps with a railing? : Total 6 Click Score: 11    End of Session Equipment Utilized During Treatment: Gait belt Activity Tolerance: Patient tolerated treatment well Patient left: with family/visitor present;in bed;with bed alarm set Nurse Communication: Mobility status PT Visit Diagnosis: Unsteadiness on feet (R26.81);Other abnormalities of gait and mobility (R26.89);Muscle weakness (generalized) (M62.81);Difficulty in walking, not elsewhere classified (R26.2);Other symptoms and signs involving the nervous system (T26.712)     Time: 4580-9983 PT Time Calculation (min) (ACUTE ONLY): 16 min  Charges:  $Therapeutic Activity: 8-22 mins                     Noella Kipnis B. Beverely Risen PT, DPT Acute Rehabilitation Services Pager [phone] Office [phone]    Elon Alas [facility] 03/21/2020, 2:43 PM

## 2020-03-21 NOTE — TOC Transition Note (Signed)
Transition of Care [facility]) - CM/SW Discharge Note   Patient Details  Name: [name] MRN: [id number] Date of Birth: Nov 16, 1940  Transition of Care [facility]) CM/SW Contact:  Terrial Rhodes, LCSWA Phone Number: 03/21/2020, 1:58 PM   Clinical Narrative:     Patient will DC to: [facility] [place], [place] [address]  Anticipated DC date: 03/21/2020  Family notified: Bonita Quin and Steward Drone  Transport by: Car  ?  Per MD patient ready for DC to [facility] . RN, patient, patient's family, and facility notified of DC. Discharge Summary sent to facility. RN given number for report tele# [phone] [id number]. DC packet on chart.  CSW signing off.  Final next level of care: Skilled Nursing Facility Barriers to Discharge: No Barriers Identified   Patient Goals and CMS Choice Patient states their goals for this hospitalization and ongoing recovery are:: to go to SNF CMS Medicare.gov Compare Post Acute Care list provided to:: Patient Choice offered to / list presented to : Patient  Discharge Placement              Patient chooses bed at:  [facility] in [place]) Patient to be transferred to facility by: Car/family Name of family member notified: Bonita Quin and Steward Drone Patient and family notified of of transfer: 03/21/20  Discharge Plan and Services                                     Social Determinants of Health (SDOH) Interventions     Readmission Risk Interventions No flowsheet data found.

## 2020-03-25 ENCOUNTER — Encounter: Attending: Cardiovascular Disease | Primary: Family Medicine

## 2020-03-27 ENCOUNTER — Encounter: Payer: MEDICARE | Attending: Rheumatology | Primary: Family Medicine

## 2020-03-31 ENCOUNTER — Inpatient Hospital Stay: Admit: 2020-03-31 | Payer: MEDICARE | Primary: Family Medicine

## 2020-03-31 LAB — CBC WITH AUTOMATED DIFF
ABS. BASOPHILS: 0 10*3/uL (ref 0.0–0.2)
ABS. EOSINOPHILS: 0 10*3/uL (ref 0.0–0.8)
ABS. IMM. GRANS.: 1.5 10*3/uL — ABNORMAL HIGH (ref 0.0–0.5)
ABS. LYMPHOCYTES: 1.1 10*3/uL (ref 0.5–4.6)
ABS. MONOCYTES: 2.3 10*3/uL — ABNORMAL HIGH (ref 0.1–1.3)
ABS. NEUTROPHILS: 33.1 10*3/uL — ABNORMAL HIGH (ref 1.7–8.2)
ABSOLUTE NRBC: 0 10*3/uL (ref 0.0–0.2)
BASOPHILS: 0 % (ref 0.0–2.0)
EOSINOPHILS: 0 % — ABNORMAL LOW (ref 0.5–7.8)
HCT: 31.8 % — ABNORMAL LOW (ref 35.8–46.3)
HGB: 10 g/dL — ABNORMAL LOW (ref 11.7–15.4)
IMMATURE GRANULOCYTES: 4 % (ref 0.0–5.0)
LYMPHOCYTES: 3 % — ABNORMAL LOW (ref 13–44)
MCH: 26.6 PG (ref 26.1–32.9)
MCHC: 31.4 g/dL (ref 31.4–35.0)
MCV: 84.6 FL (ref 79.6–97.8)
MONOCYTES: 6 % (ref 4.0–12.0)
MPV: 11.2 FL (ref 9.4–12.3)
NEUTROPHILS: 87 % — ABNORMAL HIGH (ref 43–78)
PLATELET COMMENTS: ADEQUATE
PLATELET: 241 10*3/uL (ref 150–450)
RBC: 3.76 M/uL — ABNORMAL LOW (ref 4.05–5.2)
RDW: 15.1 % — ABNORMAL HIGH (ref 11.9–14.6)
WBC: 38 10*3/uL — ABNORMAL HIGH (ref 4.3–11.1)

## 2020-03-31 LAB — METABOLIC PANEL, BASIC
Anion gap: 9 mmol/L (ref 7–16)
BUN: 31 MG/DL — ABNORMAL HIGH (ref 8–23)
CO2: 25 mmol/L (ref 21–32)
Calcium: 9 MG/DL (ref 8.3–10.4)
Chloride: 103 mmol/L (ref 98–107)
Creatinine: 1.54 MG/DL — ABNORMAL HIGH (ref 0.6–1.0)
GFR est AA: 42 mL/min/{1.73_m2} — ABNORMAL LOW (ref >60)
GFR est non-AA: 34 mL/min/{1.73_m2} — ABNORMAL LOW (ref >60)
Glucose: 111 mg/dL — ABNORMAL HIGH (ref 65–100)
Potassium: 4.7 mmol/L (ref 3.5–5.1)
Sodium: 137 mmol/L (ref 136–145)

## 2020-03-31 LAB — MAGNESIUM
Magnesium: 1.9 mg/dL (ref 1.8–2.4)
Magnesium: 1.9 mg/dL (ref 1.8–2.4)

## 2020-03-31 LAB — TROPONIN-HIGH SENSITIVITY: Troponin-High Sensitivity: 183.9 pg/mL — CR (ref 0–14)

## 2020-03-31 LAB — CBC WITH AUTO DIFFERENTIAL
Basophils %: 0 % (ref 0.0–2.0)
Basophils Absolute: 0 10*3/uL (ref 0.0–0.2)
Eosinophils %: 0 % — ABNORMAL LOW (ref 0.5–7.8)
Eosinophils Absolute: 0 10*3/uL (ref 0.0–0.8)
Granulocyte Absolute Count: 1.5 10*3/uL — ABNORMAL HIGH (ref 0.0–0.5)
Hematocrit: 31.8 % — ABNORMAL LOW (ref 35.8–46.3)
Hemoglobin: 10 g/dL — ABNORMAL LOW (ref 11.7–15.4)
Immature Granulocytes: 4 % (ref 0.0–5.0)
Lymphocytes %: 3 % — ABNORMAL LOW (ref 13–44)
Lymphocytes Absolute: 1.1 10*3/uL (ref 0.5–4.6)
MCH: 26.6 PG (ref 26.1–32.9)
MCHC: 31.4 g/dL (ref 31.4–35.0)
MCV: 84.6 FL (ref 79.6–97.8)
MPV: 11.2 FL (ref 9.4–12.3)
Monocytes %: 6 % (ref 4.0–12.0)
Monocytes Absolute: 2.3 10*3/uL — ABNORMAL HIGH (ref 0.1–1.3)
NRBC Absolute: 0 10*3/uL (ref 0.0–0.2)
Neutrophils %: 87 % — ABNORMAL HIGH (ref 43–78)
Neutrophils Absolute: 33.1 10*3/uL — ABNORMAL HIGH (ref 1.7–8.2)
Platelet Comment: ADEQUATE
Platelets: 241 10*3/uL (ref 150–450)
RBC: 3.76 M/uL — ABNORMAL LOW (ref 4.05–5.2)
RDW: 15.1 % — ABNORMAL HIGH (ref 11.9–14.6)
WBC: 38 10*3/uL — ABNORMAL HIGH (ref 4.3–11.1)

## 2020-03-31 LAB — BASIC METABOLIC PANEL
Anion Gap: 9 mmol/L (ref 7–16)
BUN: 31 MG/DL — ABNORMAL HIGH (ref 8–23)
CO2: 25 mmol/L (ref 21–32)
Calcium: 9 MG/DL (ref 8.3–10.4)
Chloride: 103 mmol/L (ref 98–107)
Creatinine: 1.54 MG/DL — ABNORMAL HIGH (ref 0.6–1.0)
EGFR IF NonAfrican American: 34 mL/min/{1.73_m2} — ABNORMAL LOW (ref >60)
GFR African American: 42 mL/min/{1.73_m2} — ABNORMAL LOW (ref >60)
Glucose: 111 mg/dL — ABNORMAL HIGH (ref 65–100)
Potassium: 4.7 mmol/L (ref 3.5–5.1)
Sodium: 137 mmol/L (ref 136–145)

## 2020-03-31 LAB — TROPONIN, HIGH SENSITIVITY: Troponin, High Sensitivity: 183.9 pg/mL (ref 0–14)

## 2020-04-09 ENCOUNTER — Encounter: Attending: Cardiovascular Disease | Primary: Family Medicine

## 2020-04-09 ENCOUNTER — Encounter: Primary: Family Medicine

## 2020-04-12 NOTE — Telephone Encounter (Signed)
Called s/w Tyra.  She is asking how long the pt needs to wear the heart monitor.  I explained to Tyra that there is not any documentation that our office placed a heart monitor on pt.   According to hospital discharge summary, 7/12, a 30 day monitor was ordered.   Tyra states that pt has since been seen at [facility] and heart monitor was placed after pt was seen at [facility].  I informed Tyra that there should be an instruction pamphlet in the monitor box with a toll free # she can call to find out MD's name it was registered under and how long she is to wear the monitor.

## 2020-04-12 NOTE — Telephone Encounter (Signed)
Tyra with [facility] called wanting to know when pt is to take off his heart monitor.  Please give Tyra a call.  Thank you.

## 2020-04-16 ENCOUNTER — Encounter: Attending: Family Medicine | Primary: Family Medicine

## 2020-04-22 MED ORDER — PANTOPRAZOLE 40 MG TAB, DELAYED RELEASE
40 mg | ORAL_TABLET | ORAL | 5 refills | Status: AC
Start: 2020-04-22 — End: ?

## 2020-04-30 MED ORDER — NITROGLYCERIN 0.4 MG SUBLINGUAL TAB
0.4 | SUBLINGUAL | Status: DC
Start: 2020-04-30 — End: 2020-04-30

## 2020-05-15 NOTE — Telephone Encounter (Signed)
 CLINICAL PHARMACY: ADHERENCE REVIEW  Identified care gap per [place]; fills at [facility]: ACE/ARB, Diabetes and Statin adherence    Last Visit: 02/13/20    Patient identified as LIS = 2, therefore patient may be able to receive a 90-day supply for the same cost as a 30-day supply    ASSESSMENT  ACE/ARB ADHERENCE    Per Insurance Records through 04/29/20 (2020 PDC = n/a; YTD PDC = 99%; Potential Fail Date: 09/17/2020 (has met metric for 2021)):   Amlodipine-benazepril 5-20 mg tab last filled on 04/16/20 for 90 day supply. Next refill due: 07/15/20    Noted current med list is amlodipine and lisinopril. Appears meds were changed at 10/18/19 OV.  Upon review of CareEverywhere appears pt's BP medications were adjusted in hospital. Per 03/31/20 hospital discharge summary lisinopril was discontinued at hospital discharge. Per discharge summary documentation:  3. Also of note, patient has had chronically low diastolic BPs in the 30s while here. Her systolics have ranged in the 110-120s. She was not maintained on any BP medications here. At the time of discharge I will not renew her home lisinopril 40 mg. I will renew hctz. She should have BP monitoring over the next week. If BP is running high she can resume the lisinopril.    Per Reconciled Dispense Report: 10/28/19 to 04/09/20  AMLODIPINE-BENAZ 5/20MG  CAPSULES 01/12/2020 90 90 Each NEWMAN,STEVEN M [facility]...     AMLODIPINE BESYLATE 5MG  TABLETS 02/27/2020 90 90 Each FREEMAN,NED DAVID [facility]...     LISINOPRIL 40MG  TABLETS 01/12/2020 90 90 Each NEWMAN,STEVEN M [facility]...     Per [facility]:   Amlodipine-benazepril last picked up on 01/11/20 for 90 day supply.  They will deactivate so does not accidentally get filled, 04/16/20 Rx was not picked up.  Amlodipine last picked up on 05/06/20 for 30 day supply.  Lisinopril last picked up on 01/12/20 for 90 day supply. Refills remaining if needed, did not fill since appears on hold.    BP  Readings from Last 3 Encounters:   02/27/20 (!) 176/60   02/13/20 128/74   02/13/20 (!) 140/70     Estimated Creatinine Clearance: 28.4 mL/min (A) (by C-G formula based on SCr of 1.54 mg/dL (H)).    DIABETES ADHERENCE    Per Insurance Records through 04/29/20 (2020 PDC = n/a; YTD PDC = 92%; Potential Fail Date: 09/05/20):   Glipizide ER 2.5 mg tab last filled on 04/16/20 for 90 day supply. Next refill due: 07/15/20    Per Reconciled Dispense Report: 10/28/19 to 04/09/20  GLIPIZIDE ER 2.5MG  TABLET 01/12/2020 90 90 Each NEWMAN,STEVEN M [facility]...     Per [facility]:   Glipizide last picked up on 05/06/20 for 30 day supply. Refills remaining.    Lab Results   Component Value Date/Time    Hemoglobin A1c 6.8 (H) 10/18/2019 11:49 AM    Hemoglobin A1c 6.5 (H) 03/22/2019 10:31 AM    Hemoglobin A1c 7.1 (H) 10/12/2018 03:17 PM    Hemoglobin A1c, External 5.8 06/11/2014 12:00 AM     NOTE A1c <9%    STATIN ADHERENCE    Per Insurance Records through 04/29/20 (2020 PDC = did not meet metric; YTD PDC = filled only once, potential fail date 05/18/20):   Pravastatin 80 mg tab last filled on 12/30/19 for 90 day supply. Next refill due: 03/29/20  Per 03/17/20 admission encounter in CareEverywhere appears pt was changed from pravastatin to atorvastatin 40 mg  daily (admission was for stroke)    Per Reconciled Dispense Report: 10/28/19 to 04/09/20  PRAVASTATIN SODIUM 80 MG TABS 04/06/2020 90 90 Tablet NEWMAN,STEVEN [facility]...   PRAVASTATIN 80MG  TABLETS 12/31/2019 90 90 Each NEWMAN,STEVEN M [facility]...     Per [facility]:   Pravastatin last picked up on 01/02/20 for 90 day supply.   Atorvastatin 40 mg tab 1 daily 05/06/20 x 30 ds - needs new Rx or refill    Lab Results   Component Value Date/Time    Cholesterol, total 209 (H) 10/18/2019 11:49 AM    HDL Cholesterol 92 10/18/2019 11:49 AM    LDL, calculated 107 (H) 10/18/2019 11:49 AM    LDL, calculated 83 03/22/2019 10:31 AM    VLDL, calculated 10  10/18/2019 11:49 AM    VLDL, calculated 13 03/22/2019 10:31 AM    Triglyceride 53 10/18/2019 11:49 AM    CHOL/HDL Ratio 2.5 07/07/2015 05:15 AM     ALT (SGPT)   Date Value Ref Range Status   10/18/2019 16 0 - 32 IU/L Final     AST (SGOT)   Date Value Ref Range Status   10/18/2019 15 0 - 40 IU/L Final     The ASCVD Risk score Verdon DC Jr., et al., 2013) failed to calculate for the following reasons:    The 2013 ASCVD risk score is only valid for ages 70 to 21     PLAN  The following are interventions that have been identified:  - pt was recently admitted to hospital/SNF (see CareEverywhere) and several medication changes. Appears no longer on RASA medication and pravastatin switched to atorvastatin  - will need refill on atorvastatin; no PCP HFU visit currently scheduled    Attempted to reach patient to review. Pt's dtr Erminio answered phone and stated pt unavailable at time of call. Pt manages medications. Stated could call back Friday. Will try back then to get updated medication list.    No future appointments.    Josette Null, PharmD, [facility]  Population Health Pharmacist  [facility] Clinical Pharmacy  Department, toll free: [phone]

## 2020-05-17 NOTE — Telephone Encounter (Signed)
Attempted to reach patient to discuss medications and update medication list. No answer, LM.    Of note per updated [employer] report:    ACE/ARB ADHERENCE  Per Insurance Records through 05/14/20 (2020 PDC = n/a; YTD PDC = 85%; Potential Fail Date: 06/18/20):   Lisinopril 40 mg tab last filled on 01/12/20 for 90 day supply. Next refill due: 04/15/20  Believe may no longer be taking this medication but would like to confirm? See previous note in this encounter    DIABETES ADHERENCE  Per Insurance Records through 05/14/20 (2020 PDC = n/a; YTD PDC = 84%; Potential Fail Date: 06/18/20):   Glipizide ER 2.5 mg tab last filled on 05/06/20 for 90 day supply. Next refill due: 08/04/20    STATIN ADHERENCE  Per Insurance Records through 05/14/20 [phone] PDC = did not meet metric; YTD PDC = 72%; Potential Fail Date: 06/17/20):   Atorvastatin 40 mg tab last filled on 05/06/20 for 30 day supply. Next refill due: 06/05/20

## 2020-05-21 NOTE — Telephone Encounter (Signed)
 Spoke with pt's dtr who assists with pt's medications. Pt is currently sleeping in background. States discussed medications with someone yesterday as well and believes they reconciled everything - was supposed to check and see if still using glipizide and reports pt is taking. Noted did see [facility] yesterday (see CareEverywhere). Dtr reports may be getting set up with in-home provider. Pt's dtr read me current medication list/bottles. Updated medication list, although marked some meds as not taking for now. Sounds like she may be having provider coming into the home since pt is currently not very mobile. Encouraged to follow up with provider(s) as advised post discharge from rehab.     Per review of CareEverywhere:  Admission 03/17/20 to 03/21/20 [facility] - discharge to SNF [facility] rehab)  Admission 03/31/20 to 04/03/20 [facility] - discharge to SNF [facility] rehab)  Admission 04/07/20 to 04/10/20 [facility] - discharge to SNF [facility] rehab)  ED 04/13/20 - discharged back to SNF  ED 04/29/20    Medication Sig   . pantoprazole (PROTONIX) 40 mg tablet TAKE 1 TABLET BY MOUTH DAILY  - omeprazole 20 mg daily  - did discuss that appears Rxs are for pantoprazole 40 mg but confirms omeprazole is name on bottle  - did not remove pantoprazole Rx from med list   - appears possibly changed from omeprazole to pantoprazole at 8/4 discharge  - dtr reports had issues refilling med recently and this is Rx they were given   . hydroCHLOROthiazide (HYDRODIURIL) 25 mg tablet Take 1 Tablet by mouth daily.  - not currently taking  - appears placed on hold at 03/21/20 hospital discharge from [facility] (see 03/17/20 admission encounter in [facility])  - restarted at 04/03/20 hospital discharge but then on hold per 04/10/20 discharge   . amLODIPine (NORVASC) 5 mg tablet Take 1 Tablet by mouth daily.  - confirmed still on this  - is on current [facility] med list   . gabapentin (NEURONTIN) 300 mg capsule TAKE 1 CAPSULE BY MOUTH THREE  TIMES DAILY  - not currently taking   . glipiZIDE SR (GLUCOTROL XL) 2.5 mg CR tablet TAKE 1 TABLET BY MOUTH DAILY  - still taking this  - not on [facility] med list but states was told to see if still taking this, reports pt is and planning to discuss with in-home provider   . nitroglycerin (Nitrostat) 0.4 mg SL tablet Place 1 sl under the tongue q 5 min prn cp, max 3 sl in a 15-min time period. Call 911 if no relief after the 3rd sl.   . metoprolol succinate (TOPROL-XL) 50 mg XL tablet Take 1 Tab by mouth daily.  - not currently taking  - appears discontinued at 04/03/20 hospital discharge due to bradycardia  - not on current [facility] med list   . DULoxetine (CYMBALTA) 60 mg capsule Take 1 Cap by mouth daily.  - not currently taking   . pravastatin (PRAVACHOL) 80 mg tablet TAKE 1 TABLET BY MOUTH DAILY  - confirmed switched to atorvastatin 40 mg tab - 1 daily  - has enough for now, advised will likely need refill Rx when due. dtr aware and will ask in-home provider  - updated med list   . predniSONE (DELTASONE) 1 mg tablet Take 4 pills of the prednisone after breakfast.  - not currently taking   . glucose blood VI test strips (OneTouch Ultra Blue Test Strip) strip Test glucose BID   . cholecalciferol (Vitamin D3) (2,000 UNITS /50 MCG) cap capsule  TAKE 1 CAPSULE BY MOUTH EVERY DAY  - currently out of this, planning to get OTC   . lisinopriL (PRINIVIL, ZESTRIL) 40 mg tablet Take 1 Tab by mouth daily.  - not currently taking      Other current medications:   Tamsulosin 0.4 mg cap - 1 daily (states this is new for urine). States she is doing well on this. Added to med list. Seeing [facility] u[facility] Dr. Tobie (see CareEverywhere)   Ondansetron 4 mg tab 1 every 4 hours prn nausea - added to med list   Ticagrelor 90 mg BID. Per review changed from Plavix due to suspected non-responder   ASA daily    Asked about iron supplement (since this is on [facility] med list) - states not currently taking that she is aware of but will look  into. Advised is available OTC if needed.    For Pharmacy Admin Tracking Only  . Gap Closed?: Yes  . Time Spent (min): 45

## 2020-06-10 NOTE — Telephone Encounter (Signed)
 CLINICAL PHARMACY: ADHERENCE REVIEW  Identified care gap per [place]; fills at [facility]: ACE/ARB, Diabetes and Statin adherence    Last Visit: 05/20/20 outside cardiology; 02/13/20 PCP  See 05/15/20 pharmacy note - pt was possibly getting set up with in-home provider?    Patient identified as LIS = 2, therefore patient may be able to receive a 90-day supply for the same cost as a 30-day supply    ASSESSMENT  ACE/ARB ADHERENCE    Lisinopril d/c 03/31/20 hospital discharge    DIABETES ADHERENCE    Per Insurance Records through 05/26/20 (2020 PDC = n/a; YTD PDC = 85%; Potential Fail Date: 09/05/20):   Glipizide ER last filled on 05/06/20 for 90 day supply. Next refill due: 08/04/20  0 refills remaining per hyperlink below    Per Reconciled Dispense Report: 11/23/19 to 05/22/20  GLIPIZIDE ER 2.5MG  TABLETS 05/06/2020 90 90 Each NEWMAN,STEVEN M [facility]...   GLIPIZIDE ER 2.5MG  TABLET 01/12/2020 90 90 Each NEWMAN,STEVEN M [facility]...     Lab Results   Component Value Date/Time    Hemoglobin A1c 6.8 (H) 10/18/2019 11:49 AM    Hemoglobin A1c 6.5 (H) 03/22/2019 10:31 AM    Hemoglobin A1c 7.1 (H) 10/12/2018 03:17 PM    Hemoglobin A1c, External 5.8 06/11/2014 12:00 AM     NOTE A1c <9%    STATIN ADHERENCE    Per Insurance Records through 05/26/20 (2020 PDC = n/a; YTD PDC = 74%; Potential Fail Date: 06/17/20):   Atorvastatin 40 mg tab last filled on 05/06/20 for 30 day supply. Next refill due: 06/05/20  Prescriber OBED AGYEI; 0 refills remaining on hyperlink below    Per Reconciled Dispense Report: 11/23/19 to 05/22/20  ATORVASTATIN 40MG  TABLETS 05/06/2020 30 30 Each AGYEI,OBED [facility]...     PRAVASTATIN SODIUM 80 MG TABS 04/06/2020 90 90 Tablet NEWMAN,STEVEN [facility]...   PRAVASTATIN 80MG  TABLETS 12/31/2019 90 90 Each NEWMAN,STEVEN M [facility]...     Lab Results   Component Value Date/Time    Cholesterol, total 209 (H) 10/18/2019 11:49 AM    HDL Cholesterol 92 10/18/2019  11:49 AM    LDL, calculated 107 (H) 10/18/2019 11:49 AM    LDL, calculated 83 03/22/2019 10:31 AM    VLDL, calculated 10 10/18/2019 11:49 AM    VLDL, calculated 13 03/22/2019 10:31 AM    Triglyceride 53 10/18/2019 11:49 AM    CHOL/HDL Ratio 2.5 07/07/2015 05:15 AM     ALT (SGPT)   Date Value Ref Range Status   10/18/2019 16 0 - 32 IU/L Final     AST (SGOT)   Date Value Ref Range Status   10/18/2019 15 0 - 40 IU/L Final     The ASCVD Risk score Verdon DC Jr., et al., 2013) failed to calculate for the following reasons:    The 2013 ASCVD risk score is only valid for ages 55 to 87     PLAN  The following are interventions that have been identified:  - Patient overdue refilling atorvastatin and active on home medication list  - Patient needs refills for atorvastatin, glipizide - per previous pharmacy note possibly establishing with in-home provider; otherwise may need to ask Ethyl Elspeth HERO, MD for refills?    Reached patient's daughter Erminio to review. Erminio states the pt still has a few tablets remaining of atorvastatin, but is completely out of tamsulosin. States they are currently waiting on in-home physician to come to  house today (from [employer]? She was unsure). Pt is still immobile. Discussed writer will call back tomorrow to follow up on medications and if needing any assistance getting refills.    No future appointments.    Josette Null, PharmD, [facility]  Population Health Pharmacist  [facility] Clinical Pharmacy  Department, toll free: [phone]

## 2020-06-11 NOTE — Telephone Encounter (Signed)
 Spoke with Erminio. Erminio stating visiting provider yesterday was nurse practitioner and that she sent all prescriptions to [facility] pharmacy that were needed. Does confirm atorvastatin Rx was sent to [facility] since no refills were on bottle. Erminio denies any medication issues or questions at this time.    For Pharmacy Admin Tracking Only    . CPA in place: No  . Recommendation Provided To: Patient/Caregiver: 1 via Telephone  . Intervention Detail: Adherence Monitoring: 1  . Gap Closed?: Yes  . Intervention Accepted By: Patient/Caregiver: 1  . Time Spent (min): 15

## 2020-07-10 NOTE — Telephone Encounter (Signed)
 CLINICAL PHARMACY: ADHERENCE REVIEW  Identified care gap per [place]; fills at [facility]: ACE/ARB, Diabetes and Statin adherence    Last Visit: 05/20/20 outside cardiology; 02/13/20 PCP; see previous pharmacy encounter 06/10/20 - pt now has provider coming into home    Patient identified as LIS = 2, therefore patient may be able to receive a 90-day supply for the same cost as a 30-day supply    ASSESSMENT  ACE/ARB ADHERENCE    Per Insurance Records through 06/30/20 (2020 PDC = n/a; YTD PDC = 71%; Potential Fail Date: impossible in 2021):   Lisinopril 40 mg tab last filled on 01/12/20 for 90 day supply. Next refill due: 04/15/20    RASA has been discontinued 03/31/20 hospital discharge    BP Readings from Last 3 Encounters:   02/27/20 (!) 176/60   02/13/20 128/74   02/13/20 (!) 140/70     Estimated Creatinine Clearance: 28.4 mL/min (A) (by C-G formula based on SCr of 1.54 mg/dL (H)).    DIABETES ADHERENCE    Per Insurance Records through 06/30/20 (2020 PDC = n/a; YTD PDC = 86%; Potential Fail Date: 09/05/20):   Glipizide ER 2.5 mg tab last filled on 05/06/20 for 90 day supply. Next refill due: 08/04/20  0 refill remaining per hyperlink below. Per previous pharmacy encounter refill Rxs sent by new provider.    Per reconcile dispense: 12/23/19 to 05/22/20  GLIPIZIDE ER 2.5MG  TABLETS 05/06/2020 90 90 Each NEWMAN,STEVEN M [facility] #...   GLIPIZIDE ER 2.5MG  TABLET 01/12/2020 90 90 Each NEWMAN,STEVEN M [facility] #...     Per [facility]:   Glipizide ER Rx on hold next fill.     Lab Results   Component Value Date/Time    Hemoglobin A1c 6.8 (H) 10/18/2019 11:49 AM    Hemoglobin A1c 6.5 (H) 03/22/2019 10:31 AM    Hemoglobin A1c 7.1 (H) 10/12/2018 03:17 PM    Hemoglobin A1c, External 5.8 06/11/2014 12:00 AM     NOTE A1c <9%    STATIN ADHERENCE    Per Insurance Records through 06/30/20 (2020 PDC = did not meet metric; YTD PDC = 78%; Potential Fail Date: 07/18/19):   Atorvastatin 40 mg tab last filled on 06/06/20  for 30 day supply. Next refill due: 07/06/20    Per Reconciled Dispense Report: 12/23/19 to 05/22/20  ATORVASTATIN 40MG  TABLETS 05/06/2020 30 30 Each AGYEI,OBED [facility] #...     PRAVASTATIN SODIUM 80 MG TABS 04/06/2020 90 90 Tablet NEWMAN,STEVEN [facility] #...   PRAVASTATIN 80MG  TABLETS 12/31/2019 90 90 Each NEWMAN,STEVEN M [facility] #...     Per [facility]:   Atorvastatin 07/05/20 ready for pick up - $1.30.    Lab Results   Component Value Date/Time    Cholesterol, total 209 (H) 10/18/2019 11:49 AM    HDL Cholesterol 92 10/18/2019 11:49 AM    LDL, calculated 107 (H) 10/18/2019 11:49 AM    LDL, calculated 83 03/22/2019 10:31 AM    VLDL, calculated 10 10/18/2019 11:49 AM    VLDL, calculated 13 03/22/2019 10:31 AM    Triglyceride 53 10/18/2019 11:49 AM    CHOL/HDL Ratio 2.5 07/07/2015 05:15 AM     ALT (SGPT)   Date Value Ref Range Status   10/18/2019 16 0 - 32 IU/L Final     AST (SGOT)   Date Value Ref Range Status   10/18/2019 15 0 - 40 IU/L Final     The ASCVD Risk score (Goff DC  Jr., et al., 2013) failed to calculate for the following reasons:    The 2013 ASCVD risk score is only valid for ages 56 to 109     PLAN  The following are interventions that have been identified:  - atorvastatin due around now, ready for pick up at pharmacy   - glipizide Rx on file for next fill    No patient out reach planned at this time. Will set follow up date to confirm pick up.  No future appointments.    Josette Null, PharmD, [facility]  Population Health Pharmacist  [facility] Clinical Pharmacy  Department, toll free: [phone]     For Pharmacy Admin Tracking Only    . CPA in place: No  . Recommendation Provided To: Patient/Caregiver: 1 via Telephone and Pharmacy: 1  . Intervention Detail: Adherence Monitoring: 1  . Gap Closed?: Yes  . Intervention Accepted By: Patient/Caregiver: 1 and Pharmacy: 1  . Time Spent (min): 20

## 2020-07-17 NOTE — Telephone Encounter (Signed)
Spoke with pharmacy. Atorvastatin last picked up 06/11/20 x 1 month supply. Last fill was not picked up, they will get readyagain. Copay - $1.30 for 90 day supply.    Spoke with pt's caregiver Steward Drone at pt's home number listed. States they are about out of atorvastatin so will plan to pick up. In-home provider was there during call so unable to discuss much further but she expressed appreciation for follow up.    Updated SDE.

## 2020-08-05 MED ORDER — GLIPIZIDE SR 2.5 MG 24 HR TAB
2.5 mg | ORAL_TABLET | ORAL | 5 refills | Status: AC
Start: 2020-08-05 — End: ?

## 2020-09-23 ENCOUNTER — Encounter: Payer: MEDICARE | Attending: Family Medicine | Primary: Family Medicine

## 2020-10-01 ENCOUNTER — Ambulatory Visit: Attending: Family Medicine | Primary: Family Medicine

## 2020-10-01 ENCOUNTER — Ambulatory Visit: Admit: 2020-10-01 | Discharge: 2020-10-01 | Payer: MEDICARE | Attending: Family Medicine | Primary: Family Medicine

## 2020-10-01 DIAGNOSIS — I10 Essential (primary) hypertension: Secondary | ICD-10-CM

## 2020-10-01 LAB — AMB POC COMPLETE CBC,AUTOMATED ENTER
ABS. GRANS (POC): 5.7 10*3/uL (ref 2.0–7.8)
ABS. LYMPHS (POC): 2.1 10*3/uL (ref 0.6–4.1)
GRANULOCYTES (POC): 66.6 % (ref 37.0–92.0)
Granulocytes %, POC: 66.6 % (ref 37.0–92.0)
Granulocytes Abs: 5.7 10*3/uL (ref 2.0–7.8)
HCT (POC): 30.8 % — AB (ref 37.0–51.0)
HGB (POC): 9.7 g/dL — AB (ref 12.0–18.0)
Hematocrit, POC: 30.8 % — AB (ref 37.0–51.0)
Hemoglobin, POC: 9.7 g/dL — AB (ref 12.0–18.0)
LYMPHOCYTES (POC): 24.2 % (ref 10.0–58.5)
Lymphocyte %: 24.2 % (ref 10.0–58.5)
Lymphs Abs: 2.1 10*3/uL (ref 0.6–4.1)
MCH (POC): 26.2 pg (ref 26.0–32.0)
MCH: 26.2 pg (ref 26.0–32.0)
MCHC (POC): 31.5 g/dL (ref 31.0–36.0)
MCHC: 31.5 g/dL (ref 31.0–36.0)
MCV (POC): 83.2 fL (ref 80.0–97.0)
MCV: 83.2 fL (ref 80.0–97.0)
MID% POC: 9.2 % (ref 0.1–24.0)
MPV (POC): 8.7 fL (ref 0.0–49.9)
MPV POC: 8.7 fL (ref 0.0–49.9)
Mid # (POC): 0.8 10*3/uL (ref 0.0–1.8)
Mid Cells %, POC: 9.2 % (ref 0.1–24.0)
Mid Cells Absoulute POC: 0.8 10*3/uL (ref 0.0–1.8)
PLATELET (POC): 314 10*3/uL (ref 140–440)
Platelet Count, POC: 314 10*3/uL (ref 140–440)
RBC (POC): 3.7 10*6/uL — AB (ref 4.20–6.30)
RBC, POC: 3.7 10*6/uL — AB (ref 4.20–6.30)
RDW (POC): 14.7 % — AB (ref 11.5–14.5)
RDW, POC: 14.7 % — AB (ref 11.5–14.5)
WBC (POC): 8.5 10*3/uL (ref 4.1–10.9)
WBC, POC: 8.5 10*3/uL (ref 4.1–10.9)

## 2020-10-01 LAB — AMB POC URINALYSIS DIP STICK AUTO W/ MICRO
Bilirubin (UA POC): NEGATIVE
Bilirubin, Urine, POC: NEGATIVE
Blood (UA POC): NEGATIVE
Blood (UA POC): NEGATIVE
Glucose (UA POC): NEGATIVE
Glucose, Urine, POC: NEGATIVE
Ketones (UA POC): NEGATIVE
Ketones, Urine, POC: NEGATIVE
Leukocyte Esterase, Urine, POC: NEGATIVE
Leukocyte esterase (UA POC): NEGATIVE
Nitrite, Urine, POC: NEGATIVE
Nitrites (UA POC): NEGATIVE
Specific Gravity, Urine, POC: 1.02 NA (ref 1.001–1.035)
Specific gravity (UA POC): 1.02 (ref 1.001–1.035)
Urobilinogen (UA POC): NORMAL (ref 0.2–1)
Urobilinogen, POC: NORMAL (ref 0.2–1)
pH (UA POC): 7 (ref 4.6–8.0)
pH, Urine, POC: 7 NA (ref 4.6–8.0)

## 2020-10-01 LAB — AMB POC URINE, MICROALBUMIN, SEMIQUANTITATIVE
Microalbumin urine (POC): 100 MG/L
Microalbumin urine, POC: 100 MG/L

## 2020-10-01 NOTE — Telephone Encounter (Signed)
Dr.Newman is requesting pt be seen for Normocytic anemia pt last seen 05/2018

## 2020-10-01 NOTE — Progress Notes (Signed)
SUBJECTIVE:   Sarah Chavez is a 80 y.o. female who has a past medical history significant for hypertension, diabetes, high cholesterol, polymyalgia rheumatica, overactive bladder,??lower extremity edema, sleep apnea, DJD, vitamin D deficiency, anemia of chronic disease, CAD,??shingles and postherpetic neuralgia.  Patient presents today accompanied by her daughter.  Patient reports ongoing bladder spasms and lower abdominal discomfort.  She has seen urology in the past for urinary retention.  She is not clear if this is something new or continuation of her chronic issues.  She reports some frequency but no dysuria.    In addition the patient reports that for roughly 6 months she has been experiencing worsening dysphagia.  This seems to be random and intermittent.  Of solids and liquids at times.  Patient reports no breakthrough reflux symptoms but does have a history of reflux.    HPI  See above    Past Medical History, Past Surgical History, Family history, Social History, and Medications were all reviewed with the patient today and updated as necessary.       Current Outpatient Medications   Medication Sig Dispense Refill   ??? hydroCHLOROthiazide (HYDRODIURIL) 25 mg tablet Take 25 mg by mouth daily.     ??? clopidogreL (PLAVIX) 75 mg tab Take 75 mg by mouth daily.     ??? glipiZIDE SR (GLUCOTROL XL) 2.5 mg CR tablet TAKE 1 TABLET BY MOUTH DAILY 30 Tablet 5   ??? atorvastatin (LIPITOR) 40 mg tablet Take 40 mg by mouth daily.     ??? tamsulosin (FLOMAX) 0.4 mg capsule Take 0.4 mg by mouth daily.     ??? ondansetron hcl (ZOFRAN) 4 mg tablet Take 4 mg by mouth every four (4) hours as needed for Nausea or Vomiting.     ??? aspirin (ASPIRIN) 325 mg tablet Take 325 mg by mouth daily.     ??? pantoprazole (PROTONIX) 40 mg tablet TAKE 1 TABLET BY MOUTH DAILY 30 Tablet 5   ??? amLODIPine (NORVASC) 5 mg tablet Take 1 Tablet by mouth daily. 30 Tablet 5   ??? glucose blood VI test strips (OneTouch Ultra Blue Test Strip) strip Test glucose  BID 100 Strip 5   ??? cholecalciferol (Vitamin D3) (2,000 UNITS /50 MCG) cap capsule TAKE 1 CAPSULE BY MOUTH EVERY DAY 30 Cap 5   ??? ticagrelor (Brilinta) 90 mg tablet Take  by mouth two (2) times a day. (Patient not taking: Reported on 10/01/2020)     ??? gabapentin (NEURONTIN) 300 mg capsule TAKE 1 CAPSULE BY MOUTH THREE TIMES DAILY (Patient not taking: Reported on 05/21/2020) 90 Cap 1   ??? nitroglycerin (Nitrostat) 0.4 mg SL tablet Place 1 sl under the tongue q 5 min prn cp, max 3 sl in a 15-min time period. Call 911 if no relief after the 3rd sl. (Patient not taking: Reported on 10/01/2020) 1 Bottle 11   ??? DULoxetine (CYMBALTA) 60 mg capsule Take 1 Cap by mouth daily. (Patient not taking: Reported on 05/21/2020) 90 Cap 3   ??? predniSONE (DELTASONE) 1 mg tablet Take 4 pills of the prednisone after breakfast. (Patient not taking: Reported on 10/01/2020) 120 Tab 0     Allergies   Allergen Reactions   ??? Ativan [Lorazepam] Other (comments)     Made her feel crazy and chest pressure   ??? Ativan [Lorazepam] Other (comments)     Chest fullness   ??? Chlorpheniramine Maleate Other (comments)     Altered heart rate   ??? Other Medication Palpitations  Some type of decongestant   ??? Other Medication Other (comments)     Doesn't take decongestants, but can't recall why   ??? Pseudoephedrine Hcl Other (comments)     Altered heart rate     Patient Active Problem List   Diagnosis Code   ??? HTN (hypertension) I10   ??? Diabetes mellitus type 2, controlled (HCC) E11.9   ??? Coronary artery disease involving native coronary artery of native heart without angina pectoris I25.10   ??? Dyslipidemia E78.5   ??? OSA on CPAP G47.33, Z99.89   ??? Severe obesity (HCC) E66.01   ??? Elevated sed rate R70.0   ??? Normocytic anemia D64.9   ??? Vitamin D deficiency E55.9   ??? Polymyalgia rheumatica (HCC) M35.3   ??? Murmur R01.1   ??? Localized edema R60.0   ??? 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 Abdominal pain 07/22/2015   ??? Anemia    ??? Back pain    ??? Bursitis of shoulder    ??? CAD (coronary artery disease)    ??? Cerumen impaction    ??? Chest pain, atypical 07/22/2015   ??? Diabetes (HCC)     no meds, A1c 09/28/17 6.6; avg bs 105; denies ss of hypo   ??? Diabetes mellitus out of control (HCC)    ??? Endocrine disease     thyroid   ??? Fatigue    ??? GERD (gastroesophageal reflux disease)     controlled with medicaitons   ??? Hip pain, right    ??? Hyperlipidemia, mild    ??? Hypertension     managed with medication   ??? Hypertension associated with diabetes (HCC) 07/22/2015   ??? 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 Left carotid bruit    ??? Lumbago 07/22/2015   ??? Morbid obesity (HCC) 07/22/2015    BMI 35.3   ??? OSA on CPAP 07/22/2015    uses CPAP   ??? Other ill-defined conditions(799.89)     cholesterol   ??? Other ill-defined conditions(799.89)     heart cath 96   ??? Shoulder pain, acute    ??? Shoulder pain, bilateral    ??? Sinusitis, acute maxillary    ??? Unstable angina (HCC) 07/06/2015   ??? Vitamin D deficiency    ??? Vitamin D deficiency 04/12/2019   ??? Weakness      Past Surgical History:   Procedure Laterality Date   ??? COLONOSCOPY N/A 03/01/2018    COLONOSCOPY/BMI 39 performed by Mazanec, Worthy Rancher, MD at [facility] ENDOSCOPY   ??? HX COLONOSCOPY     ??? HX HEART CATHETERIZATION  2016    without intervention   ??? HX HEENT      goiter/thyroid surgery   ??? HX THYROIDECTOMY       Family History   Problem Relation Age of Onset   ??? Stroke Sister    ??? Cancer Sister         cervical   ??? Stroke Brother    ??? Diabetes Other    ??? Hypertension Other      Social History     Tobacco Use   ??? Smoking status: Former Smoker     Packs/day: 0.50     Years: 10.00     Pack years: 5.00  Start date: 51     Quit date: 1971     Years since quitting: 51.1   ??? Smokeless tobacco: Never Used   ??? Tobacco comment: quit in 1971   Substance  Use Topics   ??? Alcohol use: No         Review of Systems  See above    OBJECTIVE:  Visit Vitals  BP 134/66   Wt 160 lb (72.6 kg)   BMI 32.32 kg/m??        Physical Exam  Constitutional:       Appearance: She is well-developed. She is obese.   HENT:      Head: Normocephalic and atraumatic.   Eyes:      Pupils: Pupils are equal, round, and reactive to light.   Neck:      Thyroid: No thyromegaly.      Vascular: No JVD.   Cardiovascular:      Rate and Rhythm: Normal rate and regular rhythm.      Heart sounds: Normal heart sounds. No murmur heard.  No friction rub. No gallop.    Pulmonary:      Effort: Pulmonary effort is normal. No respiratory distress.      Breath sounds: No wheezing or rales.   Abdominal:      Palpations: Abdomen is soft.      Tenderness: There is no abdominal tenderness. There is no guarding or rebound.   Musculoskeletal:         General: Normal range of motion.      Cervical back: Normal range of motion and neck supple.   Skin:     Findings: No rash.   Neurological:      Mental Status: She is alert and oriented to person, place, and time.         Medical problems and test results were reviewed with the patient today.         ASSESSMENT and PLAN    1.  Hypertension.  Blood pressure 134/66.  Check metabolic panel.    2.  Dysphagia.  Increase Protonix to twice a day.  Refer to GI.    3.  Reflux.  As above.    4.  Low abdominal discomfort.  UA is clear other than 2+ protein.  Patient will self refer back to urology.    5.  Diabetes.  Check A1c.    6.  History of stroke.  No new localizing or lateralizing neurological complaints.    7.  High cholesterol.  Check lipid panel.    Elements of this note have been dictated using speech recognition software. As a result, errors of speech recognition may have occurred.

## 2020-10-02 LAB — METABOLIC PANEL, COMPREHENSIVE
A-G Ratio: 0.8 — ABNORMAL LOW (ref 1.2–2.2)
ALT (SGPT): 6 IU/L (ref 0–32)
AST (SGOT): 10 IU/L (ref 0–40)
Albumin: 3.5 g/dL — ABNORMAL LOW (ref 3.6–4.6)
Alk. phosphatase: 67 IU/L (ref 44–121)
BUN/Creatinine ratio: 15 (ref 12–28)
BUN: 18 mg/dL (ref 8–27)
Bilirubin, total: 0.6 mg/dL (ref 0.0–1.2)
CO2: 23 mmol/L (ref 20–29)
Calcium: 9.5 mg/dL (ref 8.7–10.3)
Chloride: 101 mmol/L (ref 96–106)
Creatinine: 1.24 mg/dL — ABNORMAL HIGH (ref 0.57–1.00)
GFR est AA: 47 mL/min/{1.73_m2} — ABNORMAL LOW (ref >59)
GFR est non-AA: 41 mL/min/{1.73_m2} — ABNORMAL LOW (ref >59)
GLOBULIN, TOTAL: 4.6 g/dL — ABNORMAL HIGH (ref 1.5–4.5)
Glucose: 81 mg/dL (ref 65–99)
Potassium: 4.6 mmol/L (ref 3.5–5.2)
Protein, total: 8.1 g/dL (ref 6.0–8.5)
Sodium: 138 mmol/L (ref 134–144)

## 2020-10-02 LAB — HEMOGLOBIN A1C WITH EAG
Estimated average glucose: 131 mg/dL
Hemoglobin A1c: 6.2 % — ABNORMAL HIGH (ref 4.8–5.6)

## 2020-10-02 LAB — LIPID PANEL
Cholesterol, Total: 127 mg/dL (ref 100–199)
Cholesterol, total: 127 mg/dL (ref 100–199)
HDL Cholesterol: 49 mg/dL (ref >39)
HDL: 49 mg/dL (ref >39)
LDL Calculated: 64 mg/dL (ref 0–99)
LDL, calculated: 64 mg/dL (ref 0–99)
Triglyceride: 68 mg/dL (ref 0–149)
Triglycerides: 68 mg/dL (ref 0–149)
VLDL, calculated: 14 mg/dL (ref 5–40)
VLDL: 14 mg/dL (ref 5–40)

## 2020-10-02 LAB — COMPREHENSIVE METABOLIC PANEL
ALT: 6 IU/L (ref 0–32)
AST: 10 IU/L (ref 0–40)
Albumin/Globulin Ratio: 0.8 NA — ABNORMAL LOW (ref 1.2–2.2)
Albumin: 3.5 g/dL — ABNORMAL LOW (ref 3.6–4.6)
Alkaline Phosphatase: 67 IU/L (ref 44–121)
BUN: 18 mg/dL (ref 8–27)
Bun/Cre Ratio: 15 NA (ref 12–28)
CO2: 23 mmol/L (ref 20–29)
Calcium: 9.5 mg/dL (ref 8.7–10.3)
Chloride: 101 mmol/L (ref 96–106)
Creatinine: 1.24 mg/dL — ABNORMAL HIGH (ref 0.57–1.00)
EGFR IF NonAfrican American: 41 mL/min/{1.73_m2} — ABNORMAL LOW (ref >59)
GFR African American: 47 mL/min/{1.73_m2} — ABNORMAL LOW (ref >59)
Globulin, Total: 4.6 g/dL — ABNORMAL HIGH (ref 1.5–4.5)
Glucose: 81 mg/dL (ref 65–99)
Potassium: 4.6 mmol/L (ref 3.5–5.2)
Sodium: 138 mmol/L (ref 134–144)
Total Bilirubin: 0.6 mg/dL (ref 0.0–1.2)
Total Protein: 8.1 g/dL (ref 6.0–8.5)

## 2020-10-02 LAB — HEMOGLOBIN A1C W/EAG
Hemoglobin A1C: 6.2 % — ABNORMAL HIGH (ref 4.8–5.6)
eAG: 131 mg/dL

## 2020-10-11 ENCOUNTER — Encounter

## 2020-10-14 NOTE — Telephone Encounter (Signed)
Called to remind of pt apt tomorrow 2/8 with Dr. Rose Fillers. Spoke w/ pt's daughter who stated pt isn't feeling well and will not be able to come in for apt tomorrow.    Msg sent to RN and scheduler.

## 2020-10-15 ENCOUNTER — Encounter: Primary: Family Medicine

## 2020-10-15 ENCOUNTER — Encounter: Attending: Hematology & Oncology | Primary: Family Medicine

## 2020-10-15 NOTE — Telephone Encounter (Signed)
Called to rs, no answer, lvm to call back to rs

## 2020-10-21 ENCOUNTER — Encounter: Attending: Family Medicine | Primary: Family Medicine

## 2020-10-22 IMAGING — CT CT ANGIO HEAD
3 of 13 series · 14 of 47 positions shown · IV contrast (OMNI 350)
Comparison: Head CT same day
COMPARISON: Head CT same day

Addendum:
CLINICAL DATA: Code stroke. Headache and right-sided deficits.
Negative head CT.

EXAM:
CT ANGIOGRAPHY HEAD AND NECK
TECHNIQUE: Multidetector CT imaging of the head and neck was performed using
the standard protocol during bolus administration of intravenous
contrast. Multiplanar CT image reconstructions and MIPs were
obtained to evaluate the vascular anatomy. Carotid stenosis
measurements (when applicable) are obtained utilizing NASCET
criteria, using the distal internal carotid diameter as the
denominator.
CONTRAST:  75mL OMNIPAQUE IOHEXOL 350 MG/ML SOLN

[Series 9: arterial thick sag · sagittal · arterial · 0.66mm/px · 1 of 41 slices shown]
[im 21/41  brain]
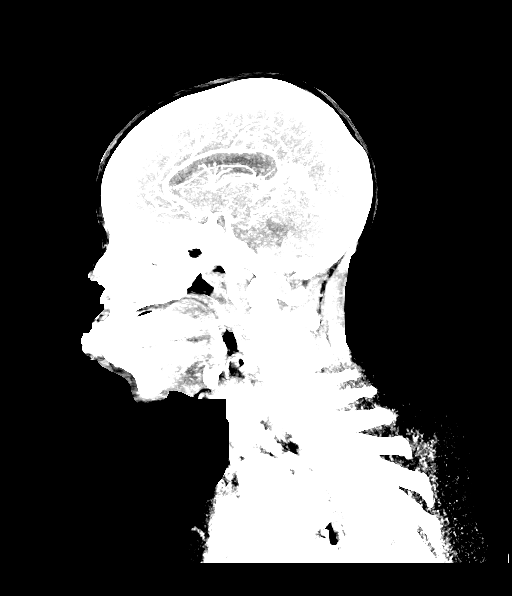

[Series 10: arterial thins · axial · arterial · 0.52mm/px · z∈[-279,+0]mm · 11 of 335 slices shown]
[im 28/335  brain]
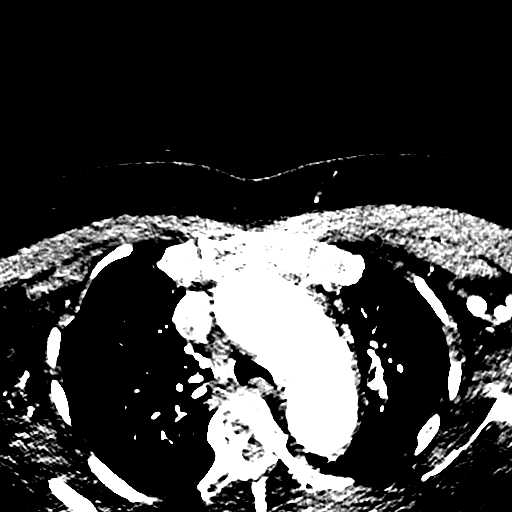
[im 56/335  bone]
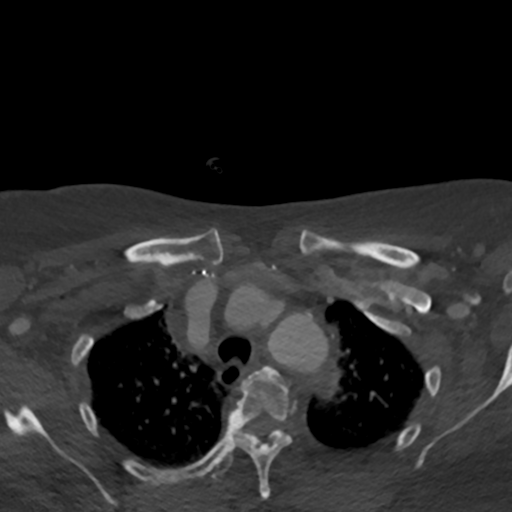
[im 84/335  brain]
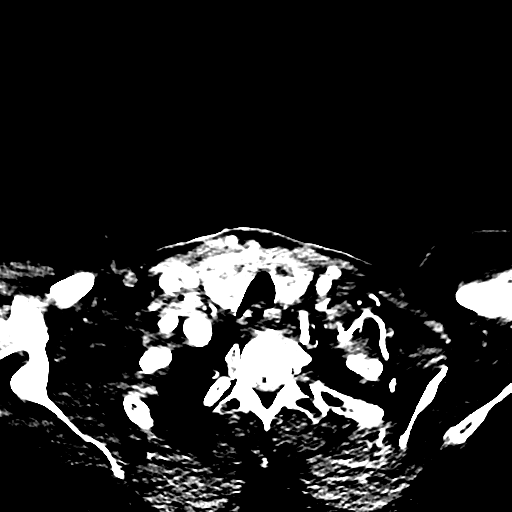
[im 112/335  bone]
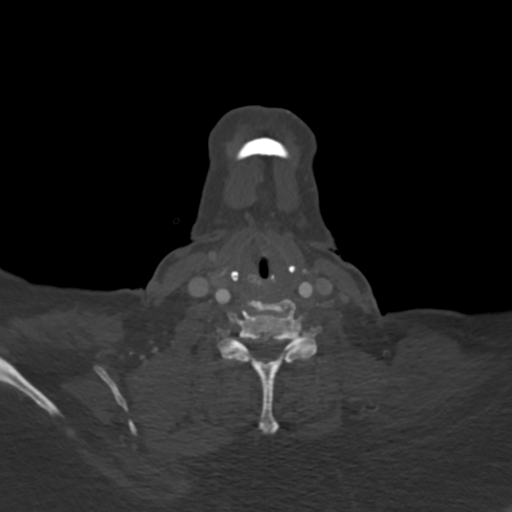
[im 140/335  brain]
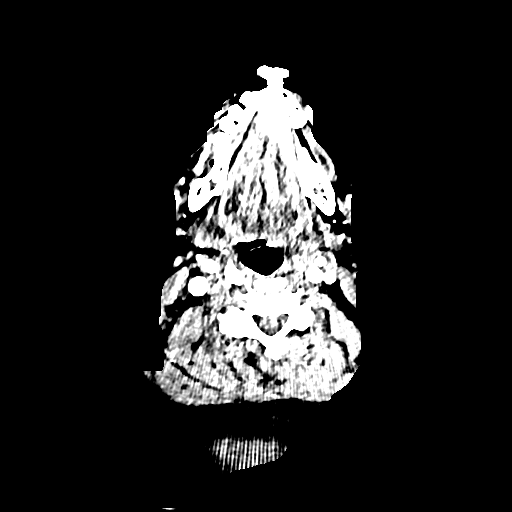
[im 168/335  bone]
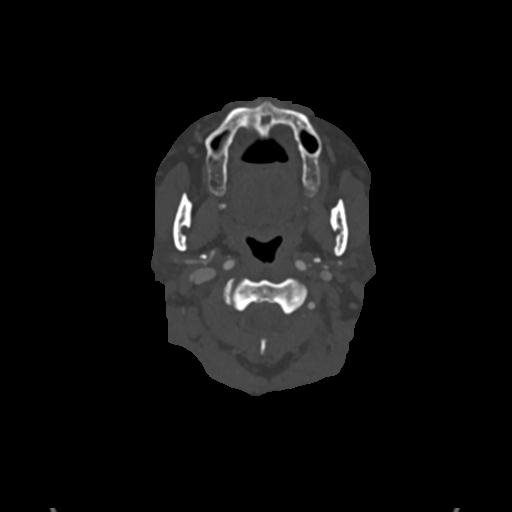
[im 195/335  brain]
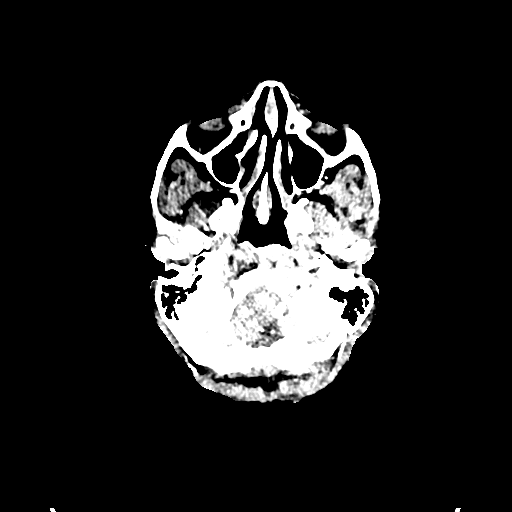
[im 223/335  bone]
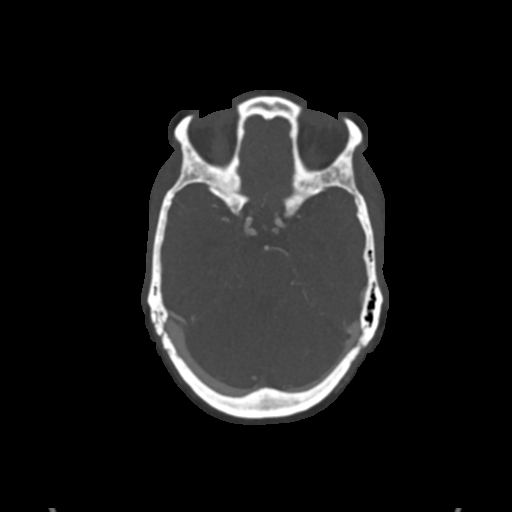
[im 251/335  brain]
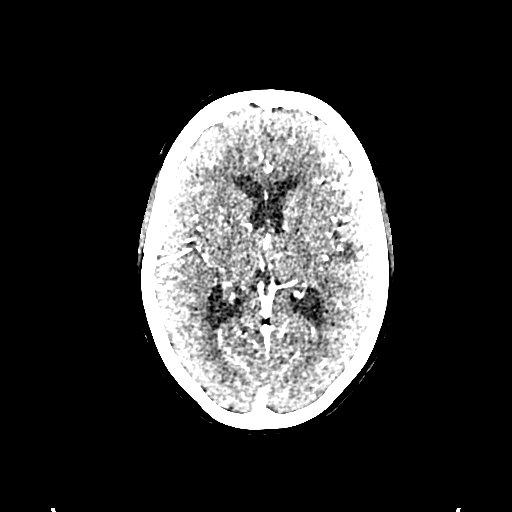
[im 279/335  bone]
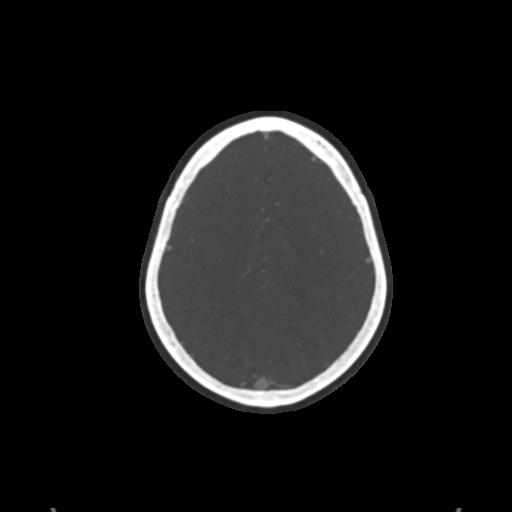
[im 307/335  brain]
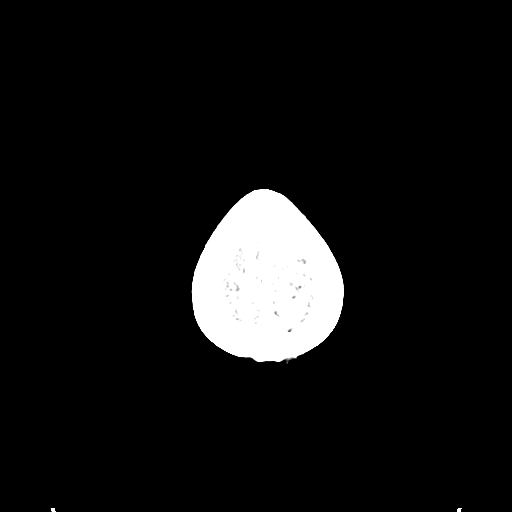

[Series 13: early thick cor · coronal · arterial · 0.36mm/px · 2 of 54 slices shown]
[im 18/54  brain]
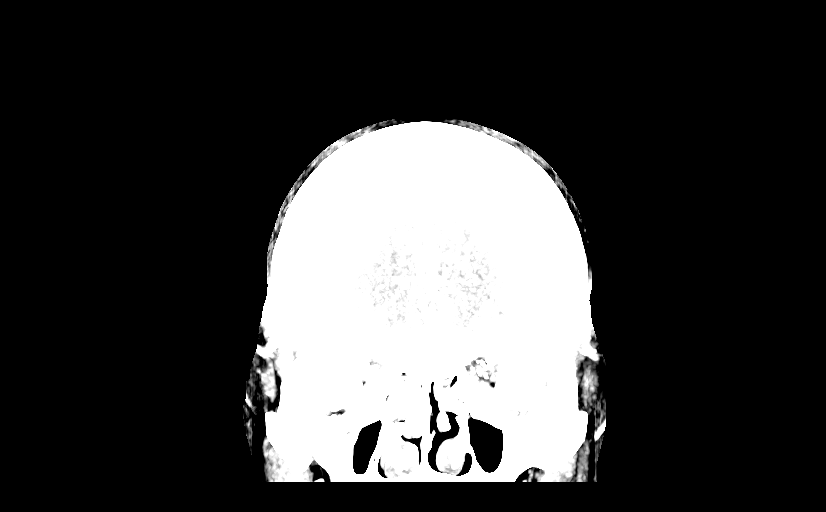
[im 36/54  brain]
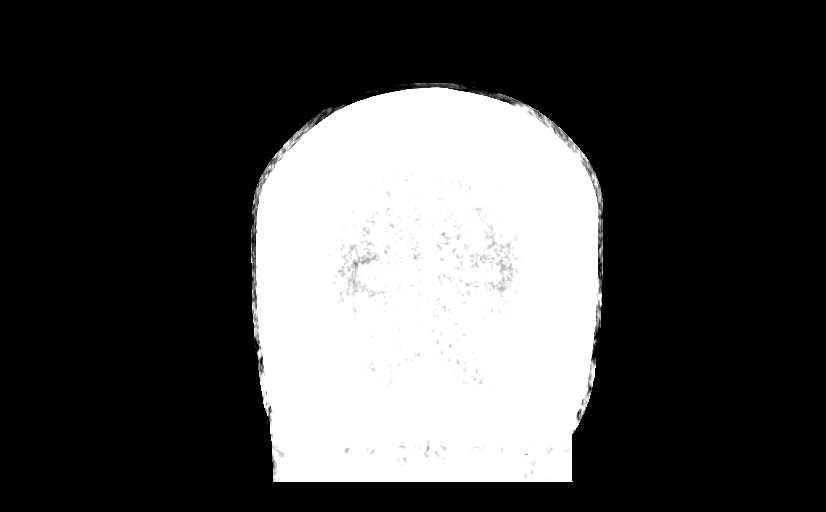

[14 of 47 positions shown; findings below may reference images not displayed]

FINDINGS: CTA NECK FINDINGS

Aortic arch: Aortic atherosclerosis and ectasia. Maximal diameter in
the arch on the order of 4.2 cm.

Right carotid system: Common carotid artery widely patent to the
bifurcation. Atherosclerotic plaque at the carotid bifurcation but
no ICA stenosis compared to the more distal cervical ICA.

Left carotid system: Common carotid artery widely patent to the
bifurcation. Calcified plaque at the distal bulb with minimal
diameter of 3.3 mm. Compared to a more distal cervical ICA diameter
of 4.1 mm, this indicates a 20% stenosis.

Vertebral arteries: Left vertebral artery origin is widely patent.
Left vertebral artery is widely patent through the cervical region
to the foramen magnum. The right vertebral artery is occluded at its
origin and reconstituted in the upper cervical region by cervical
collaterals.

Skeleton: Ordinary cervical spondylosis.

Other neck: No mass or lymphadenopathy. Enlarged heterogeneous
thyroid gland consistent with goiter.

Upper chest: Negative

Review of the MIP images confirms the above findings

CTA HEAD FINDINGS

Anterior circulation: Both internal carotid arteries are patent
through the skull base and siphon regions. Minimal siphon
atherosclerotic calcification without stenosis. The anterior and
middle cerebral vessels are normal. No large or medium vessel
occlusion. No proximal stenosis.

Posterior circulation: Both vertebral arteries show flow through the
foramen magnum to the basilar. No basilar stenosis. Posterior
circulation branch vessels are normal.

Venous sinuses: Patent and normal.

Anatomic variants: None significant.

Review of the MIP images confirms the above findings
IMPRESSION: No intracranial large or medium vessel occlusion or proximal
stenosis.

Mild atherosclerotic change at both carotid bifurcations. No
stenosis on the right. 20% stenosis distal ICA bulb on the left.

Right vertebral artery occluded at its origin and reconstituted
distally by cervical collaterals.

Pronounced aortic atherosclerosis and ectasia. Maximal diameter at
the arch measures 4.2 cm. Recommend semi-annual imaging followup by
CTA or MRA and referral to cardiothoracic surgery if not already
obtained. This recommendation follows 1454
ACCF/AHA/AATS/ACR/ASA/SCA/WAF/LARANGEIRA/HANSEN/KHIRY Guidelines for the
Diagnosis and Management of Patients With Thoracic Aortic Disease.
Circulation. 1454; 121: E266-e36. Aortic aneurysm NOS (Q0X6P-616.A)

These results were communicated to Dr. Samsonaite at [DATE] pmon
03/17/2020by text page via the AMION messaging system.

ADDENDUM:
Review of this examination after the MRI shows that there is no
visible flow in the right superior cerebellar artery.

*** End of Addendum ***
FINDINGS: CTA NECK FINDINGS

Aortic arch: Aortic atherosclerosis and ectasia. Maximal diameter in
the arch on the order of 4.2 cm.

Right carotid system: Common carotid artery widely patent to the
bifurcation. Atherosclerotic plaque at the carotid bifurcation but
no ICA stenosis compared to the more distal cervical ICA.

Left carotid system: Common carotid artery widely patent to the
bifurcation. Calcified plaque at the distal bulb with minimal
diameter of 3.3 mm. Compared to a more distal cervical ICA diameter
of 4.1 mm, this indicates a 20% stenosis.

Vertebral arteries: Left vertebral artery origin is widely patent.
Left vertebral artery is widely patent through the cervical region
to the foramen magnum. The right vertebral artery is occluded at its
origin and reconstituted in the upper cervical region by cervical
collaterals.

Skeleton: Ordinary cervical spondylosis.

Other neck: No mass or lymphadenopathy. Enlarged heterogeneous
thyroid gland consistent with goiter.

Upper chest: Negative

Review of the MIP images confirms the above findings

CTA HEAD FINDINGS

Anterior circulation: Both internal carotid arteries are patent
through the skull base and siphon regions. Minimal siphon
atherosclerotic calcification without stenosis. The anterior and
middle cerebral vessels are normal. No large or medium vessel
occlusion. No proximal stenosis.

Posterior circulation: Both vertebral arteries show flow through the
foramen magnum to the basilar. No basilar stenosis. Posterior
circulation branch vessels are normal.

Venous sinuses: Patent and normal.

Anatomic variants: None significant.

Review of the MIP images confirms the above findings
IMPRESSION: No intracranial large or medium vessel occlusion or proximal
stenosis.

Mild atherosclerotic change at both carotid bifurcations. No
stenosis on the right. 20% stenosis distal ICA bulb on the left.

Right vertebral artery occluded at its origin and reconstituted
distally by cervical collaterals.

Pronounced aortic atherosclerosis and ectasia. Maximal diameter at
the arch measures 4.2 cm. Recommend semi-annual imaging followup by
CTA or MRA and referral to cardiothoracic surgery if not already
obtained. This recommendation follows 1454
ACCF/AHA/AATS/ACR/ASA/SCA/WAF/LARANGEIRA/HANSEN/KHIRY Guidelines for the
Diagnosis and Management of Patients With Thoracic Aortic Disease.
Circulation. 1454; 121: E266-e36. Aortic aneurysm NOS (Q0X6P-616.A)

These results were communicated to Dr. Samsonaite at [DATE] pmon
03/17/2020by text page via the AMION messaging system.

## 2020-10-26 IMAGING — DX DG CHEST 2V
2 series · 2 of 2 positions shown · non-contrast
Comparison: CTA neck 03/17/2020

CLINICAL DATA: Stroke.  Cough, shortness of breath

EXAM:
CHEST - 2 VIEW

[x chest ap]
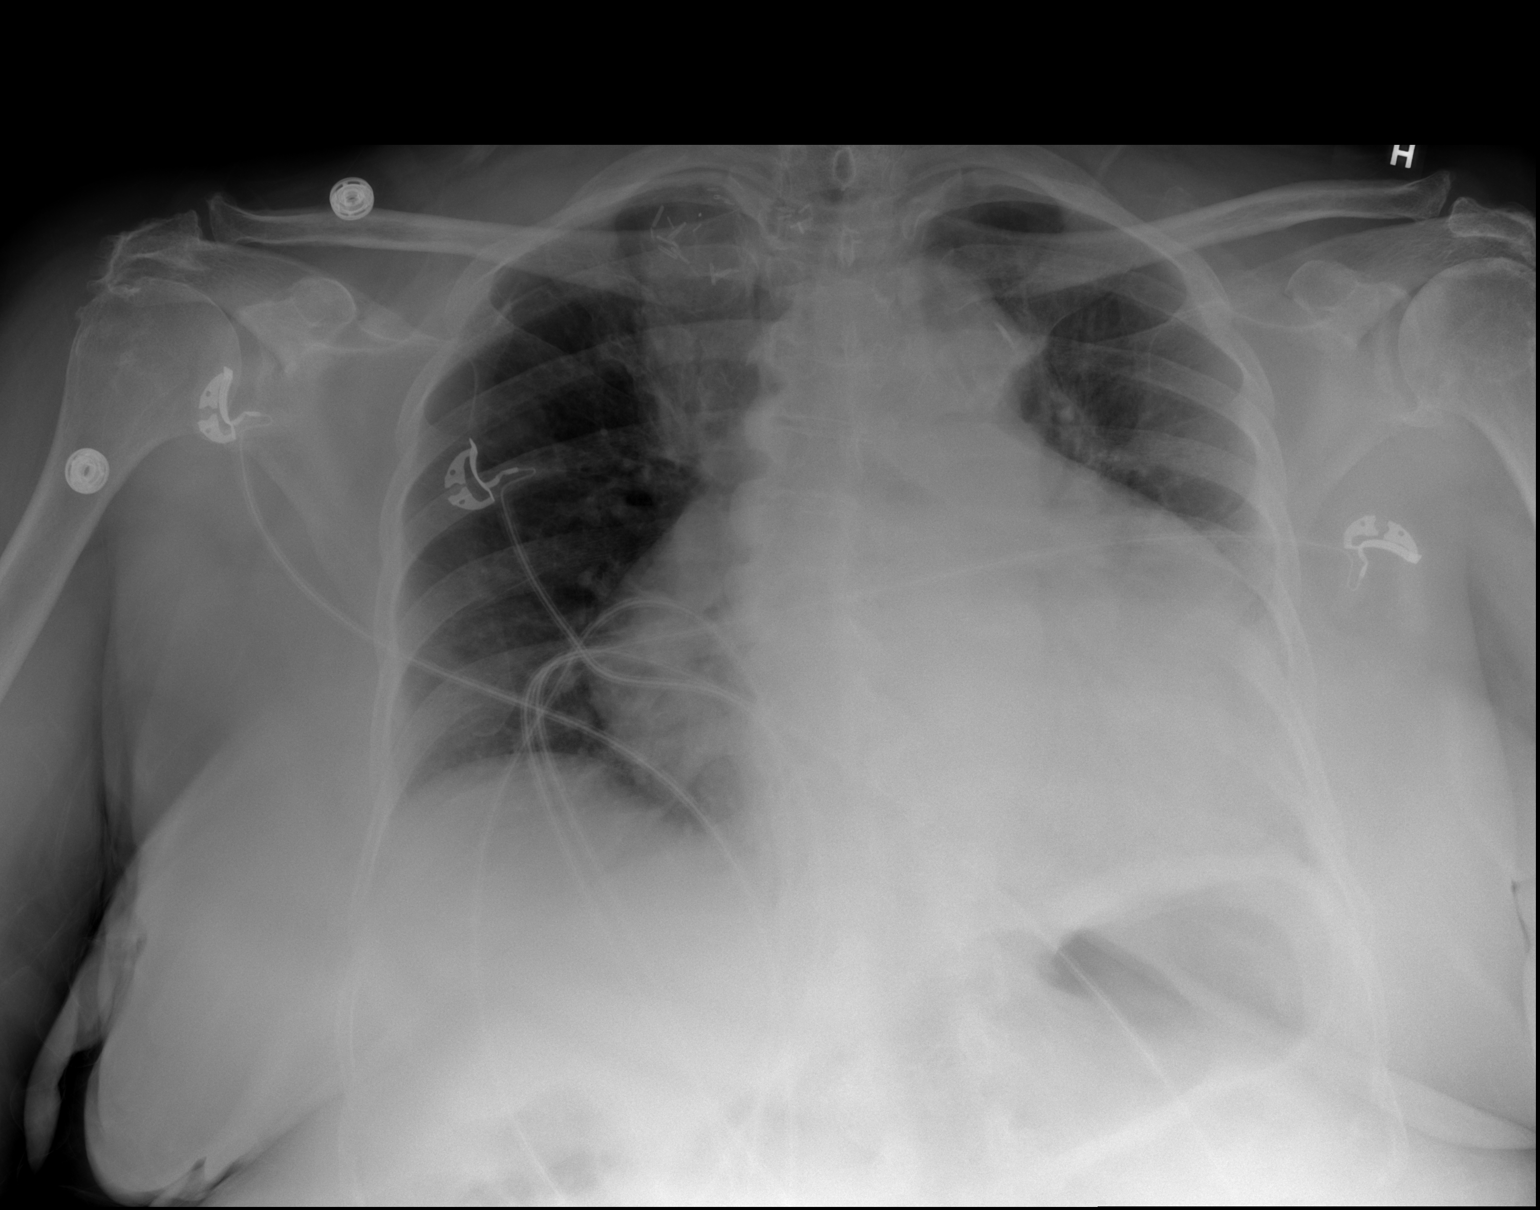

[w chest lat]
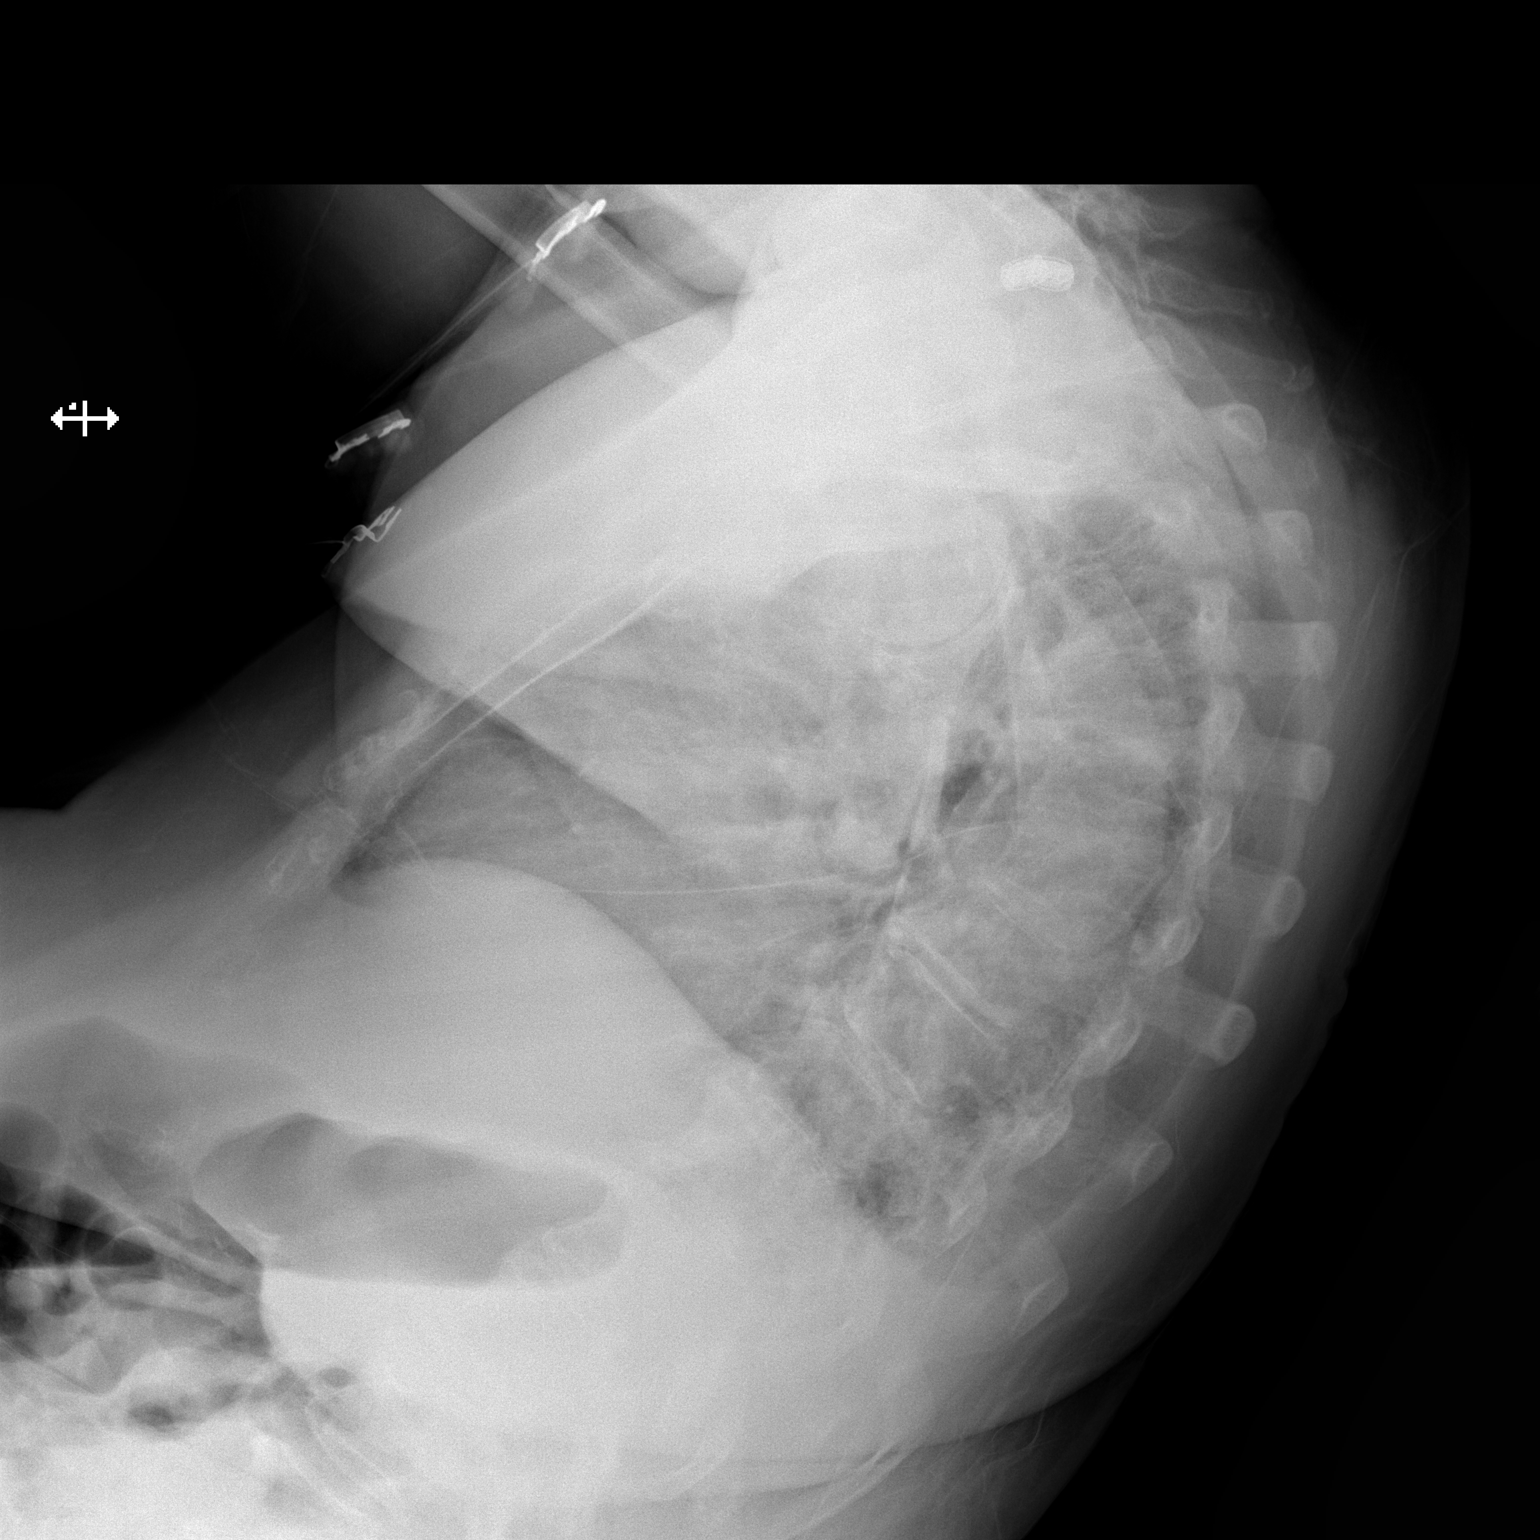

[2 of 2 positions shown; findings below may reference images not displayed]

FINDINGS: Moderate cardiomegaly. Atherosclerosis ectasias of the thoracic
aorta was better appreciated on CTA neck 03/17/2020. There is no
appreciable airspace consolidation. No frank pulmonary edema. No
evidence of pleural effusion or pneumothorax. No acute bony
abnormality identified
IMPRESSION: Moderate cardiomegaly.

No appreciable airspace consolidation or frank pulmonary edema.

Aortic Atherosclerosis ([id number]).

## 2020-12-02 ENCOUNTER — Encounter: Attending: Family Medicine | Primary: Family Medicine
